# Patient Record
Sex: Female | Born: 1935 | Race: White | Hispanic: No | Smoking: Former smoker
Health system: Southern US, Community
[De-identification: ages and names within clinical notes are randomized; demographics above are authoritative.]

## PROBLEM LIST (undated history)

## (undated) DIAGNOSIS — K219 Gastro-esophageal reflux disease without esophagitis: Secondary | ICD-10-CM

## (undated) DIAGNOSIS — I10 Essential (primary) hypertension: Secondary | ICD-10-CM

## (undated) DIAGNOSIS — M810 Age-related osteoporosis without current pathological fracture: Secondary | ICD-10-CM

## (undated) DIAGNOSIS — I4891 Unspecified atrial fibrillation: Secondary | ICD-10-CM

## (undated) DIAGNOSIS — M1990 Unspecified osteoarthritis, unspecified site: Secondary | ICD-10-CM

## (undated) DIAGNOSIS — N3281 Overactive bladder: Secondary | ICD-10-CM

## (undated) DIAGNOSIS — E785 Hyperlipidemia, unspecified: Secondary | ICD-10-CM

## (undated) DIAGNOSIS — I639 Cerebral infarction, unspecified: Secondary | ICD-10-CM

## (undated) DIAGNOSIS — F329 Major depressive disorder, single episode, unspecified: Secondary | ICD-10-CM

## (undated) DIAGNOSIS — O009 Unspecified ectopic pregnancy without intrauterine pregnancy: Secondary | ICD-10-CM

## (undated) DIAGNOSIS — N183 Chronic kidney disease, stage 3 (moderate): Secondary | ICD-10-CM

## (undated) DIAGNOSIS — E669 Obesity, unspecified: Secondary | ICD-10-CM

## (undated) DIAGNOSIS — I872 Venous insufficiency (chronic) (peripheral): Secondary | ICD-10-CM

## (undated) DIAGNOSIS — C4491 Basal cell carcinoma of skin, unspecified: Secondary | ICD-10-CM

## (undated) DIAGNOSIS — J309 Allergic rhinitis, unspecified: Secondary | ICD-10-CM

## (undated) HISTORY — PX: NASAL MASS EXCISION: SHX2067

## (undated) HISTORY — DX: Basal cell carcinoma of skin, unspecified: C44.91

## (undated) HISTORY — DX: Unspecified ectopic pregnancy without intrauterine pregnancy: O00.90

## (undated) HISTORY — DX: Obesity, unspecified: E66.9

## (undated) HISTORY — PX: ABDOMINAL HYSTERECTOMY: SHX81

## (undated) HISTORY — PX: APPENDECTOMY: SHX54

## (undated) HISTORY — DX: Overactive bladder: N32.81

## (undated) HISTORY — DX: Age-related osteoporosis without current pathological fracture: M81.0

## (undated) HISTORY — PX: LAPAROSCOPIC SALPINGOOPHERECTOMY: SUR795

## (undated) HISTORY — PX: BREAST REDUCTION SURGERY: SHX8

## (undated) HISTORY — DX: Unspecified atrial fibrillation: I48.91

## (undated) HISTORY — DX: Unspecified osteoarthritis, unspecified site: M19.90

## (undated) HISTORY — PX: NASAL RECONSTRUCTION: SHX2069

## (undated) HISTORY — DX: Chronic kidney disease, stage 3 (moderate): N18.3

## (undated) HISTORY — DX: Allergic rhinitis, unspecified: J30.9

## (undated) HISTORY — DX: Major depressive disorder, single episode, unspecified: F32.9

## (undated) HISTORY — DX: Venous insufficiency (chronic) (peripheral): I87.2

---

## 2006-05-14 ENCOUNTER — Ambulatory Visit: Payer: Self-pay | Admitting: Family Medicine

## 2006-05-26 ENCOUNTER — Encounter: Payer: Self-pay | Admitting: Cardiology

## 2006-05-26 ENCOUNTER — Ambulatory Visit: Payer: Self-pay

## 2006-06-04 ENCOUNTER — Ambulatory Visit: Payer: Self-pay | Admitting: Family Medicine

## 2006-06-04 ENCOUNTER — Encounter: Admission: RE | Admit: 2006-06-04 | Discharge: 2006-06-04 | Payer: Self-pay | Admitting: Family Medicine

## 2006-06-16 ENCOUNTER — Ambulatory Visit: Payer: Self-pay | Admitting: Cardiovascular Disease

## 2006-06-24 ENCOUNTER — Ambulatory Visit: Payer: Self-pay

## 2006-07-17 ENCOUNTER — Ambulatory Visit: Payer: Self-pay | Admitting: Cardiovascular Disease

## 2006-08-05 ENCOUNTER — Ambulatory Visit: Payer: Self-pay | Admitting: Family Medicine

## 2007-01-12 ENCOUNTER — Ambulatory Visit: Payer: Self-pay | Admitting: Family Medicine

## 2007-04-14 ENCOUNTER — Ambulatory Visit: Payer: Self-pay | Admitting: Family Medicine

## 2007-11-04 ENCOUNTER — Encounter: Admission: RE | Admit: 2007-11-04 | Discharge: 2007-11-04 | Payer: Self-pay | Admitting: Family Medicine

## 2007-11-04 ENCOUNTER — Ambulatory Visit: Payer: Self-pay | Admitting: Family Medicine

## 2008-03-11 ENCOUNTER — Encounter: Admission: RE | Admit: 2008-03-11 | Discharge: 2008-03-11 | Payer: Self-pay | Admitting: Orthopaedic Surgery

## 2008-04-04 ENCOUNTER — Encounter: Admission: RE | Admit: 2008-04-04 | Discharge: 2008-04-04 | Payer: Self-pay | Admitting: Orthopaedic Surgery

## 2008-05-10 ENCOUNTER — Encounter: Admission: RE | Admit: 2008-05-10 | Discharge: 2008-05-10 | Payer: Self-pay | Admitting: Orthopaedic Surgery

## 2008-05-23 ENCOUNTER — Encounter: Admission: RE | Admit: 2008-05-23 | Discharge: 2008-05-23 | Payer: Self-pay | Admitting: Orthopaedic Surgery

## 2008-05-30 ENCOUNTER — Ambulatory Visit: Payer: Self-pay | Admitting: Family Medicine

## 2008-07-18 ENCOUNTER — Ambulatory Visit: Payer: Self-pay | Admitting: Cardiovascular Disease

## 2008-07-26 ENCOUNTER — Encounter: Admission: RE | Admit: 2008-07-26 | Discharge: 2008-10-12 | Payer: Self-pay | Admitting: Orthopaedic Surgery

## 2008-12-05 ENCOUNTER — Ambulatory Visit: Payer: Self-pay | Admitting: Family Medicine

## 2008-12-05 ENCOUNTER — Encounter: Admission: RE | Admit: 2008-12-05 | Discharge: 2008-12-05 | Payer: Self-pay | Admitting: Family Medicine

## 2009-02-27 ENCOUNTER — Encounter: Admission: RE | Admit: 2009-02-27 | Discharge: 2009-02-27 | Payer: Self-pay | Admitting: Family Medicine

## 2009-02-27 ENCOUNTER — Ambulatory Visit: Payer: Self-pay | Admitting: Family Medicine

## 2009-03-17 ENCOUNTER — Ambulatory Visit: Payer: Self-pay | Admitting: Family Medicine

## 2009-10-13 ENCOUNTER — Ambulatory Visit: Payer: Self-pay | Admitting: Family Medicine

## 2010-06-21 ENCOUNTER — Encounter: Admission: RE | Admit: 2010-06-21 | Discharge: 2010-06-21 | Payer: Self-pay | Admitting: Family Medicine

## 2011-03-12 NOTE — Assessment & Plan Note (Signed)
[facility]                            CARDIOLOGY OFFICE NOTE   NAME:FRIEDMANYulanda, Resendez                     MRN:          [id number]  DATE:07/18/2008                            DOB:          1928/02/24    REASON FOR VISIT:  Chest pain.   HISTORY OF PRESENT ILLNESS:  Charlene Wade is a delightful 77 year old  woman who was evaluated approximately 2 years ago for exertional  dyspnea.  At that time, she underwent a Myoview stress scan which showed  no ischemia.  Her LV was hyperdynamic and there was normal perfusion.  She underwent a 2-D echocardiogram that confirmed a hyperdynamic left  ventricle with normal systolic function.  She presents because of a new  episode of chest discomfort.  She was shopping at the grocery store  approximately 1 month ago and she developed a brief episode of chest  pain.  She has a difficult time describing the pain, but it lasted less  than 30 seconds.  There was associated dyspnea, lightheadedness and  diaphoresis.  She felt tired after the episode and went home and rested.  When she woke up, she felt back to normal.  This all occurred on a day  when she had not eaten breakfast and the episode occurred in the early  afternoon.  She has had no further symptoms.  She specifically denies  any recurrent chest pain or dyspnea.  She has no other complaints at  this time.  She is quite sedentary and her activity is limited by back  pain.   MEDICATIONS:  1. Coenzyme Q10 120 mg daily.  2. Green Tea 500 mg daily.  3. Fish oil 1 g daily.  4. TriCor 145 mg daily.  5. Lexapro 20 mg daily.  6. Allegra 180 mg daily.  7. Actonel 35 mg monthly.  8. Mobic 7.5 mg 2 daily.  9. Metformin 500 mg daily.  10.Norvasc 10 mg daily.  11.Toprol-XL 50 mg daily.  12.Diovan HCT 160/12.5 mg daily.  13.Furosemide 40 mg daily.  14.Pravachol 80 mg at bedtime.  15.Aspirin 81 mg daily.  16.B12 with folate daily.  17.Centrum Silver daily.  18.Glucosamine.  19.Chondroitin 2-4 daily.  20.Calcium plus D daily.  21.Prilosec 20 mg daily.   PHYSICAL EXAMINATION:  GENERAL:  The patient is alert and oriented.  She  is in no acute distress.  VITAL SIGNS:  Weight 180 pounds, blood pressure 139/65, heart rate 59,  respiratory rate 16.  HEENT:  Normal.  NECK:  Normal carotid upstrokes.  No bruits.  JVP normal.  LUNGS:  Clear bilaterally.  HEART:  Regular rate and rhythm.  No murmurs or gallops.  ABDOMEN:  Soft, nontender, obese, no organomegaly.  EXTREMITIES:  No clubbing, cyanosis or edema.  Peripheral pulses intact  and equal.   EKG shows normal sinus rhythm and is within normal limits.   ASSESSMENT:  1. Isolated episode of chest pain and shortness of breath.  With a      brief symptoms and only a single episode, I think, we can observe      for now.  I  am reassured by several things.      a.     Her EKG is normal.      b.     She has had no further episodes since this one event.      c.     She had a normal echocardiogram and perfusion scan as part       of her previous evaluation.  If she has recurrent episodes, I have       asked her to contact us.  We may need to do further testing at       that point.  2. Hypertension, blood pressure under good control on her current      regimen.  3. Dyslipidemia.  The patient is under the care of Dr. Redmond School.   I would be happy to see Ms. Manson back on an as-needed basis, if she  develops any other cardiac problems.     Juanda Bond. Burt Knack, MD  Electronically Signed    MDC/MedQ  DD: 07/18/2008  DT: 07/19/2008  Job #: [id number]   cc:   Jill Alexanders, M.D.

## 2011-03-15 NOTE — Assessment & Plan Note (Signed)
[facility]                              CARDIOLOGY OFFICE NOTE   NAME:FRIEDMANKeajah, Miesner                     MRN:          [id number]  DATE:06/16/2006                            DOB:          [birth year]    PATIENT IDENTIFICATION:  Mrs. Samaha is a very pleasant 78 year old woman  who is widowed and lives alone here in [place].   CHIEF COMPLAINT:  Shortness of breath and edema.   HISTORY OF PRESENT ILLNESS:  Mrs. Kuczek is a very pleasant 77 year old  woman who presents today with progressive dyspnea on exertion, as well as,  leg swelling.  She describes chronic dyspnea with climbing stairs, has  noticed some increase in her symptoms over the past several months.  She  denies orthopnea, PND, palpitations or chest discomfort.  She denies cough  or other respiratory complaints.  She rarely exercises and complains of  osteoarthritis, and so has been unable to perform any vigorous exercise.  Her second complaint is that of leg and feet swelling.  This has also been a  chronic problem, but much worse this summer which she attributes to the hot  weather.  Her feet are increasingly swollen throughout the day, but her  symptoms are minimal when she wakes up in the morning.  She has no other  complaints at this time.   PAST MEDICAL HISTORY:  1. Hypertension on multiple medications.  2. Diabetes, borderline.  3. Dyslipidemia.  4. Depression.  5. Seasonal allergies.  6. Gastroesophageal reflux disease.  7. Chronic back pain secondary to degenerative joint disease.  8. Osteoarthritis.   FAMILY HISTORY:  The patient's mother died at age 82 of heart disease.  Father died at age 28 of heart disease.  She has one sister who is healthy  and a younger sister who has leukemia.  She has a brother who is also alive  and well.   SOCIAL HISTORY:  The patient lives alone here in [place].  She recently  moved here.  She smoked cigarettes but quit 25 years  ago.  She drinks  alcohol very rarely.  She does not drink caffeinated beverages.  She is  retired.   REVIEW OF SYSTEMS:  Pertinent positive include dyspnea and edema as  described in the HPI.  She has rheumatic fever as a young child.  Fatigue is  also a positive as is seasonal allergies.  All other systems were reviewed  and are negative except as described as above.   MEDICATIONS:  1. Mobic 7.5 mg daily.  2. Metformin 500 mg daily, recently discontinued secondary to intolerance.  3. Actonel 35 mg weekly.  4. Norvasc 10 mg daily.  5. Toprol XL 50 mg daily.  6. Diovan HCTZ 160/12.5 mg daily.  7. Lasix 40 mg daily.  8. Pravachol 80 mg daily.  9. Aspirin 81 mg daily.  10.Celexa 20 mg one half daily.  11.Zyrtec 10 mg daily.  12.B12 and folate daily.  13.Centrum Silver daily.  14.Glucosamine chondroitin daily.  15.Calcium with vitamin D q.i.d.  16.Coenzyme Q10 daily.  17.Prilosec 20 mg daily.  18.Chromium 200  mg daily.  19.P.r.n. medications include Ultracet.   PHYSICAL EXAMINATION:  GENERAL APPEARANCE:  The patient is alert and  oriented.  She is in no acute distress.  VITAL SIGNS:  Weight 172 pounds, blood pressure 124/58 in the left arm,  128/60 in the right arm, heart rate 72, respirations 12.  HEENT:  Sclerae anicteric, conjunctivae pink.  ENT:  Oropharynx moist.  No  oral lesions.  No cervical lymphadenopathy.  LUNGS:  Clear to auscultation bilaterally.  CARDIOVASCULAR:  Heart exam shows normal apex.  Heart is regular rate and  rhythm.  There are no murmurs or gallops.  Carotid upstrokes are normal  without bruits.  Jugular venous distention is normal.  ABDOMEN:  Soft, nontender.  No hepatosplenomegaly.  No abdominal bruits.  No  abdominal aorta enlargement.  EXTREMITIES:  No clubbing or cyanosis present.  There is 1+ pretibial and  pedal edema bilaterally.  Peripheral pulses are 2+ and equal throughout.  SKIN:  Warm and dry without rash.  NEUROLOGICAL:  Grossly intact  with equal motor strength bilaterally.   STUDIES:  EKG demonstrates normal sinus rhythm with left atrial enlargement  pattern.  There are no LVH criteria met.   Echocardiogram performed May 26, 2006, demonstrated normal left ventricular  systolic function with no regional wall motion abnormalities.  The LV  function was noted to be hyperdynamic.  Left atrium was mild to moderately  dilated.  The left ventricular size was normal.   LABORATORY DATA:  BNP is mildly elevated at 155.  Lipid panel shows  triglycerides of 202, cholesterol 197, LDL 88, HDL 69.  Hemoglobin A1C was  6.0.   CHEST X-RAY:  Heart size at the upper limits of normal to mildly enlarged.  Remainder of the chest x-ray was normal.   ASSESSMENT/PLAN:  Ly Slayden is a 77 year old woman with chronic  exertional dyspnea and progressive pedal edema.   From a Cardiovascular standpoint, potential etiologies could be diastolic  heart failure versus ischemic heart disease with exertional dyspnea being an  anginal equivalent.  Regarding her echocardiogram, other than left atrial  enlargement, there were no obvious signs noted of diastolic dysfunction.  Even if she has this problem, her blood pressure is very well controlled at  this point, and she is on an excellent antihypertensive regimen with  Norvasc, Toprol, Diovan HCTZ, furosemide, all in combination.  Would not  make any changes to her current antihypertensive regimen.   Regarding her exertional dyspnea, I have recommended that she undergo  myocardial perfusion scan to rule out silent ischemia or dyspnea acting as  an anginal equivalent.  I will follow up with her after the results of this  study are complete.  She will require pharmacologic study because of her  significant osteoarthritis and inability to exercise.   I suspect her lower extremity edema is most likely a result of chronic venous insufficiency.  Recommended continuation of leg elevation, and she   could consider support stockings as well.                                 Blane Ohara, MD    MDC/MedQ  DD:  06/16/2006  DT:  06/17/2006  Job #:  [id number]   cc:   Jill Alexanders, MD

## 2011-03-15 NOTE — Letter (Signed)
July 17, 2006     Jill Alexanders, MD  [place], [place]  [id number]   RE:  [name], [name]  MRN:  [id number]  /  DOB:  02-28-1928   Dear Dr. Redmond School:   It was my pleasure to see Charlene Wade back in clinic today at the [facility]  cardiology clinic. As you will recall, she is a very pleasant 77 year old  woman who was initially presented for evaluation on June 16, 2006 for  exertional dyspnea and leg swelling. She had no prior history of  cardiovascular problems with the exception of hypertension and borderline  diabetes. I recommended a stress test and a adenosine myocardial perfusion  scan was done because the patient is unable to exercise as she has problems  with osteoarthritis. The myocardial perfusion scan was normal with normal  perfusion as well as normal left ventricular contractility.   She has no new complaints today, at the time of her evaluation.   Her medicines are unchanged and consist of, Mobic 7.5 mg daily, Metformin  500 mg daily, Actonel 35 mg weekly, Norvasc 10 mg daily, Toprol XL 50 mg  daily, Diovan XC TZ 160/12.5 mg daily, furosemide 40 mg daily, Pravachol 80  mg at bedtime, aspirin 80 mg daily, Celexa 10 mg daily, Zyrtec 10 mg daily,  and multiple vitamins.   On exam she is alert and oriented and no acute distress. Vital signs: Blood  pressure is 119/61 and her heart rate is 69. Lungs: Clear. Cardiac: Regular  rate and rhythm without murmurs or gallops. Abdomen: Soft and non-tender.  Extremities: Pulses are two-plus and equal in the periphery. She has one-  plus pretibial edema.   Charlene Wade is a 77 year old woman with dyspnea. I think the most likely  etiology is deconditioning. She would clearly benefit from an exercise  program which I reviewed at length with her today. From a cardiac standpoint  she has had a normal stress nuclear study as well as a normal  echocardiogram. It is possible that she has a component of  diastolic  disfunction, but this was not seen on her echo and her blood pressure has  been under excellent control on both of her visits here so I do not suspect  that this is a major contributor.   Regarding her hypertension, she is on multiple medications and is very well  controlled. I would not recommend any changes to her medical regimen.   I will see her back on a PRN basis. If she has any cardiac problems in the  future, please feel free to contact me at any time. Thanks again for the  opportunity to see Charlene Wade in clinic.    Sincerely,      Juanda Bond. Burt Knack, MD   MDC/MedQ  DD:  07/17/2006  DT:  07/19/2006  Job #:  [id number]

## 2011-03-22 ENCOUNTER — Ambulatory Visit: Payer: Self-pay | Admitting: Physical Medicine and Rehabilitation

## 2011-04-24 ENCOUNTER — Encounter
Payer: Medicare Other | Attending: Physical Medicine and Rehabilitation | Admitting: Physical Medicine and Rehabilitation

## 2011-04-24 DIAGNOSIS — M545 Low back pain, unspecified: Secondary | ICD-10-CM

## 2011-04-24 DIAGNOSIS — M47817 Spondylosis without myelopathy or radiculopathy, lumbosacral region: Secondary | ICD-10-CM

## 2011-04-24 DIAGNOSIS — M48061 Spinal stenosis, lumbar region without neurogenic claudication: Secondary | ICD-10-CM

## 2011-04-24 DIAGNOSIS — M7660 Achilles tendinitis, unspecified leg: Secondary | ICD-10-CM

## 2011-04-24 DIAGNOSIS — M79609 Pain in unspecified limb: Secondary | ICD-10-CM | POA: Insufficient documentation

## 2011-04-24 DIAGNOSIS — I739 Peripheral vascular disease, unspecified: Secondary | ICD-10-CM | POA: Insufficient documentation

## 2011-04-24 DIAGNOSIS — R269 Unspecified abnormalities of gait and mobility: Secondary | ICD-10-CM | POA: Insufficient documentation

## 2011-04-24 DIAGNOSIS — M1710 Unilateral primary osteoarthritis, unspecified knee: Secondary | ICD-10-CM | POA: Insufficient documentation

## 2011-04-24 DIAGNOSIS — M129 Arthropathy, unspecified: Secondary | ICD-10-CM | POA: Insufficient documentation

## 2011-04-25 NOTE — Progress Notes (Signed)
[name] is a very pleasant 77 year old woman, who is widowed and lives independently in a town house.  She was referred by Dr. Sherrlyn Wade, but is currently being managed by Charlene Wade.  [name] presents with a 20-year history of low back pain.  She did have a laminectomy back in 1990.  Over the last couple of years, she has had some increasing low back pain as well as lower extremity pain with ambulation.  Her legs get heavy when she walks for more than 2-3 minutes.  When she sits down, however, they get better for her.  She describes the pain as burning, tingling, and aching.  Her low back also bothers her quite a bit when she has been up for a while.  Her average pain varies depending on activity.  While she is sitting here in clinic, it is at 2 on a scale of 10, but it can go up to 6-9 depending if she has been up walking.  Her sleep is fair.  She reports some relief with tramadol.  She uses tramadol typically once a day.  She is allowed to take it twice a day, but typically she only takes it in the morning and she uses some acetaminophen in the afternoon.  Over the years, she has trialed various treatment options for her back pain including physical therapy, water aerobics, chiropractor, TENS unit.  She has also undergone three epidural steroid injections back in 2009. She does not really think they helped her that much.  She had been contemplating a surgical option through Dr. Patrice Wade, who at that time has done some imaging studies of her low back.  She tells me she was set up for fusion, but then tells me she "chickened out."  She then did not return back to see him after that.  FUNCTIONAL STATUS:  She walks without assistance, but does have a walker, but typically does not use it.  She had some difficulty with stairs.  She does drive.  She lives independently, is independent with self-care.  She has somebody help her and clean her home and can do some light cooking  with respect to meals.  REVIEW OF SYSTEMS:  She does report some problems with bladder, has been going on for about 20 years.  She does use a pad.  She reports some weakness in the legs, tingling, trouble walking.  Admits to some depression but denies suicidal ideation.  She also reports some early morning sweats, shortness of breath, and some occasional wheezing.  PAST MEDICAL HISTORY:  Significant for osteoporosis, hypertension, osteoarthritis, diabetes, possibly diagnosed in 2006, hyperlipidemia, depression, and vitamin D deficiency.  PAST SURGICAL HISTORY:  In 1952, ectopic pregnancy.  In 1952, appendectomy.  In 1974, hysterectomy.  In 1991, prolapsed disk status post laminectomy. In 1993, posterior enterocele repair.  In 1997, chin lift.  In 2001, renal angiogram.  In 2002, basal cell cancer removed from nose.  SOCIAL HISTORY:  The patient is widowed for about a decade.  She lives independently.  Denies smoking.  She quit 20 years ago.  She rarely uses alcohol.  FAMILY HISTORY:  Positive for heart disease, high blood pressure.  She also has a son who has had systemic lupus erythematosus.  MEDICATIONS:  She brings in today include aspirin, pravastatin, omeprazole, furosemide, TriCor, metoprolol, Claritin, Micardis, tramadol, and bupropion.  PHYSICAL EXAMINATION:  VITAL SIGNS:  Blood pressure is 127/44, pulse 58, respirations 18, 97% saturated on room air. GENERAL:  She is a  well-developed, obese, elderly woman, who does not appear in any distress.  She is oriented x3.  Speech is clear.  Affect is bright.  She is alert, cooperative, and pleasant.  Follows commands without difficulty.  Answers my questions appropriately. CRANIAL NERVES:  Coordination are grossly intact.  Reflexes are 2+ at the biceps and brachioradialis bilaterally, 1+ at the triceps bilaterally.  She has diminished reflexes at the patellar and Achilles tendon.  She has no abnormal tone, clonus, or tremors.   She has a negative Hoffmann's sign.  Her motor strength is quite good in both upper and lower extremities without obvious focal deficit.  She is able to transition from sitting to standing.  Her gait is relatively stable.  She has some difficulty with tandem gait, but is able to perform Romberg test adequately.  She has some mild limitations in cervical range of motion, but she has full shoulder range of motion.  She has very good lumbar flexion, some limitations and extension are noted.  Her back is not bothering her today during flexion and extension maneuvers.  She has some tenderness to palpation in the lower lumbar segments.  She has some tenderness over the Achilles tendon on the right as well with palpation.  Sensation is intact to light touch, pinprick, as well as vibratory sensation in upper as well as lower extremities.  She has some medial joint line tenderness in both knees without obvious effusion.  She does have some excess tissue at both knees and effusion, it is difficult to assess.  Review of some previous image studies from April 04, 2008, it is noted that there was an enhancing 12-mm lesion at the L1 vertebral body suggested followup was made at that time.  She also has known degenerative changes, anterolisthesis of L4 and L5, moderate left lateral stenosis, severe left foraminal narrowing, advanced facet arthropathy.  She has severe central and left greater than right foraminal narrowing at L5-S1, advanced left greater than right facet hypertrophy, left greater than right lateral recess and foraminal narrowing at L2-3 and L3-4.  IMPRESSION: 1. Multilevel lumbar spondylosis/facet arthropathy/stenosis. 2. Lower extremity claudication secondary to above. 3. Knee osteoarthritis. 4. Right heel pain consistent with mild Achilles tendonitis.  PLAN: 1. Discussion with her primary care giver, currently Charlene Wade, phone     call to [phone], speaking with primary care  suggests repeat     MRI with contrast.  She indicates she will arrange this. 2. Regarding her gait instability, I have recommended she follow up     with physical therapy for balance exercises and lower extremity     strengthening and working on lower extremity endurance.  Regarding     pain management, I have suggested various treatment options     including continuing current regime, considering use of gabapentin,     considering epidural steroid injection.  She is currently not     interested in surgery at this time.  She would like to trial     gabapentin.  I reviewed the risks and benefits of this medication     with her.  We will start her to very low dose 100 mg, titrating up     from 1 to 3 a day over the next couple of weeks.  We also discussed     medial branch blocks as well, however, we will hold off on any     injections until imaging studies are complete.  I will see her back  in the next couple of weeks, 3-4 weeks, to review the scan as well     as get her into some physical therapy.     I have discussed the treatment options and her diagnosis with her     sister as well, Charlene Wade, per the patient's request.  I have     answered all their questions.  They are in agreement with the     current plan.     Franchot Gallo, M.D. Electronically Signed    DMK/MedQ D:  04/24/2011 13:30:21  T:  04/25/2011 02:54:37  Job #:  [id number]  cc:   Charlene Chimes, MD [address]

## 2011-05-09 ENCOUNTER — Other Ambulatory Visit: Payer: Self-pay | Admitting: Family Medicine

## 2011-05-09 DIAGNOSIS — M549 Dorsalgia, unspecified: Secondary | ICD-10-CM

## 2011-05-13 ENCOUNTER — Ambulatory Visit
Admission: RE | Admit: 2011-05-13 | Discharge: 2011-05-13 | Disposition: A | Discharge status: Home / Self Care | Payer: Medicare Other | Source: Ambulatory Visit | Attending: Family Medicine | Admitting: Family Medicine

## 2011-05-13 DIAGNOSIS — M549 Dorsalgia, unspecified: Secondary | ICD-10-CM

## 2011-05-13 MED ORDER — GADOBENATE DIMEGLUMINE 529 MG/ML IV SOLN: 8.0000 mL | Freq: Once | INTRAVENOUS | Status: AC | PRN

## 2011-05-27 ENCOUNTER — Encounter
Payer: Medicare Other | Attending: Physical Medicine and Rehabilitation | Admitting: Physical Medicine and Rehabilitation

## 2011-05-27 DIAGNOSIS — M1710 Unilateral primary osteoarthritis, unspecified knee: Secondary | ICD-10-CM | POA: Insufficient documentation

## 2011-05-27 DIAGNOSIS — M545 Low back pain, unspecified: Secondary | ICD-10-CM

## 2011-05-27 DIAGNOSIS — R209 Unspecified disturbances of skin sensation: Secondary | ICD-10-CM | POA: Insufficient documentation

## 2011-05-27 DIAGNOSIS — R29898 Other symptoms and signs involving the musculoskeletal system: Secondary | ICD-10-CM | POA: Insufficient documentation

## 2011-05-27 DIAGNOSIS — M79609 Pain in unspecified limb: Secondary | ICD-10-CM

## 2011-05-27 DIAGNOSIS — G894 Chronic pain syndrome: Secondary | ICD-10-CM

## 2011-05-27 DIAGNOSIS — M48061 Spinal stenosis, lumbar region without neurogenic claudication: Secondary | ICD-10-CM | POA: Insufficient documentation

## 2011-05-27 DIAGNOSIS — M47817 Spondylosis without myelopathy or radiculopathy, lumbosacral region: Secondary | ICD-10-CM | POA: Insufficient documentation

## 2011-05-27 NOTE — Assessment & Plan Note (Signed)
[name] is a very pleasant 77 year old widowed woman, who lives independently, who is a patient of Charlene Wade.  She initially was referred by Dr. Sherrlyn Wade, but is now being managed by Charlene Wade.  [name] has a 20-year-history of low back pain dating back to laminectomy in 1990, and over the last several years, she has had increasing low back pain and lower extremity pain.  Her legs get heavy when she has been walking for more than 2 or 3 minutes.  I made a call over to her caregiver Charlene Wade of suggesting repeat MRI which she did order and was carried out on May 13, 2011.  The results of that are attached to the chart today.  Most significantly, she has severe spinal stenosis at L5-S1 with severe left lateral recess impingement by her hypertrophy of the left facet joint and ligamentum flavum.  Also, noted mild left lateral recess stenosis at L2-3 due to increased retrolisthesis and disk bulging and endplate spurring, moderate left foraminal stenosis, and mild spinal stenosis also at L4-5.  Results of the MRI reviewed with [name].  I asked her why she did not follow through with surgery per Dr. Patrice Wade when she saw him and he offered her surgery at that time.  She states that at the time she was scheduled for surgery, she had developed a cyst in her mouth and was getting that taking care of, so she did not follow through with Surgery.  Her average pain is about 6-8 on a scale of 10, predominately located in low back, radiating to both lower extremities when she is up walking. Her pain is not bad when she is sitting and basically improves when she is at rest.  She can walk without assistance, but does use a cane or walker.  She is able to climb stairs and drive.  She cannot walk much more than 2-5 minutes.  She is independent with self-care, continues to live independently.  Denies problems with bowel or bladder.  Admits to some depression.  Denies suicidal ideation.   She does admit to some numbness and weakness in the lower extremities.  No other changes in past medical, social, or family history since her last visit.  Medications prescribed through [facility] include gabapentin 100 mg three times a day.  PHYSICAL EXAMINATION:  VITAL SIGNS:  Blood pressure is 148/63, pulse 63, respirations 16, and 96% saturated on room air. GENERAL:  She is a mildly obese, elderly woman, who appears her stated age, and does not appear in any distress.  She is oriented x3.  Speech is clear.  Affect is bright.  She is alert, cooperative and pleasant. Follows commands without difficulty.  Answers my questions appropriately. NEUROLOGIC:  Cranial nerves to coordination are intact.  Reflexes are 2+ at the biceps bilaterally, 1+ at the brachioradialis bilaterally, diminished at the triceps tendon bilaterally, zero at the knees and ankles bilaterally.  No abnormal tone, clonus, or tremors are noted. Hoffman's sign negative.  Transitions from sitting to standing slowly, has minimal difficulty with tandem gait.  Romberg test is performed adequately.  Limitations are noted in cervical range of motion, relatively well- preserved shoulder motion, limitations in lumbar motion especially with extension.  Her mother strength is generally good in both upper and lower extremities with the exception of EHL is weak bilaterally.  Vibratory, position, sense are intact as well in both lower extremities.  IMPRESSION: 1. Multilevel lumbar spondylosis/facet arthropathy/severe stenosis at  L5-S1 per recent MRI, May 13, 2011. 2. Lower extremity claudication secondary to above. 3. Knee osteoarthritis.  PLAN:  I reviewed treatment options with respect to pain management. She has found the gabapentin might be somewhat helpful.  She is done well on 100 mg three times a day.  She has not had any side effects from it.  I will increase it to 2 tablets p.o. t.i.d. and I have  instructed her if she has any problems with it to back down to 1 tablet t.i.d again, if she has any difficulty with this increase in the dosage.  She is considering following back up with Dr. Patrice Wade again for a possible more definitive treatment of her leg pain.  She is currently comfortable with our plan.  I will see her back in 6 weeks.     Franchot Gallo, M.D. Electronically Signed    DMK/MedQ D:  05/27/2011 12:13:08  T:  05/27/2011 17:26:41  Job #:  [id number]  cc:   Charlene Wade primary caregiver

## 2011-06-04 ENCOUNTER — Other Ambulatory Visit: Payer: Self-pay | Admitting: Family Medicine

## 2011-06-04 DIAGNOSIS — Z1231 Encounter for screening mammogram for malignant neoplasm of breast: Secondary | ICD-10-CM

## 2011-07-08 ENCOUNTER — Encounter
Payer: Medicare Other | Attending: Physical Medicine and Rehabilitation | Admitting: Physical Medicine and Rehabilitation

## 2011-07-08 DIAGNOSIS — Z79899 Other long term (current) drug therapy: Secondary | ICD-10-CM | POA: Insufficient documentation

## 2011-07-08 DIAGNOSIS — M545 Low back pain, unspecified: Secondary | ICD-10-CM

## 2011-07-08 DIAGNOSIS — M79609 Pain in unspecified limb: Secondary | ICD-10-CM

## 2011-07-08 DIAGNOSIS — M129 Arthropathy, unspecified: Secondary | ICD-10-CM | POA: Insufficient documentation

## 2011-07-08 DIAGNOSIS — M48061 Spinal stenosis, lumbar region without neurogenic claudication: Secondary | ICD-10-CM | POA: Insufficient documentation

## 2011-07-08 DIAGNOSIS — M1710 Unilateral primary osteoarthritis, unspecified knee: Secondary | ICD-10-CM | POA: Insufficient documentation

## 2011-07-08 DIAGNOSIS — M47817 Spondylosis without myelopathy or radiculopathy, lumbosacral region: Secondary | ICD-10-CM | POA: Insufficient documentation

## 2011-07-08 DIAGNOSIS — G894 Chronic pain syndrome: Secondary | ICD-10-CM

## 2011-07-08 DIAGNOSIS — G8929 Other chronic pain: Secondary | ICD-10-CM | POA: Insufficient documentation

## 2011-07-08 NOTE — Assessment & Plan Note (Signed)
[name] is a very pleasant 77 year old woman who is followed here in our [facility] for chronic pain complaints related to her severe stenosis at L5-S1.  She has a leg pain, predominately when she is up walking.  She describes it as sharp and burning.  When she is sitting, she is essentially pain-free.  At the last visit, she was trialed on a little higher dose of gabapentin going from 100 mg 3 times a day to 100 mg 2 tablets 3 times a day, and she reports an overall reduction in her pain scores.  She had been running at about 6, 7, and 8 and currently, she is at a 4, 5, 6.  She sleeps well.  She does not have pain at night.  She reports pain with walking, bending, standing, improves with rest and medication, and she reports that she gets good relief with the current meds.  Medications [facility] through [facility] include gabapentin 100 mg 2 tablets p.o. t.i.d.  She also has a prescription for tramadol from her primary care doctor, but she uses it typically only once per day.  FUNCTIONAL STATUS:  She is walking 5-10 minutes at a time.  She can climb stairs.  She does drive.  She is currently living independently, independent with all self-care.  REVIEW OF SYSTEMS:  Negative for problems controlling bowel or bladder, admits to some numbness and weakness in the lower extremities.  Denies depression, anxiety, anxiety, or suicidal ideation.  Review of systems otherwise negative.  No changes in past medical, social, or family history.  PHYSICAL EXAMINATION:  Her blood pressure is 138/57, pulse 62, respirations 18, 94% saturated on room air.  She is a well-developed obese elderly female who does not appear in any distress.  She is oriented x3.  Speech is clear.  Affect is bright.  She is alert, cooperative, and pleasant.  Follows commands without difficulty, answers my questions appropriately.  Cranial nerves coordination are  intact. Reflexes are diminished in the lower extremities.  No abnormal tone, clonus, or tremors are noted.  Motor strength is good in both lower extremities, 5/5 at hip flexors, knee extensors, dorsiflexors, plantar flexors.  She transitions from sitting to standing slowly and carefully.  She ambulates independently in the room without an assistive device.  She has a little difficulty with tandem gait.  Romberg test is performed adequately.  She has rather well-preserved range of motion with respect to forward flexion.  She has limited lumbar extension.  No sensory deficits are noted in the lower extremities.  IMPRESSION: 1. Multilevel lumbar spondylosis/facet arthropathy/severe stenosis at     L5-S1 per MRI May 13, 2011. 2. Lower extremity claudication secondary to above. 3. Knee osteoarthritis.  PLAN:  I have reviewed treatment options with her again.  I have suggested physical therapy for lower extremity strengthening and balance.  She declines this.  She has seen a surgeon in the past and is not interested in surgical consult.  She is not interested in any invasive means including spinal injections to help manage her pain at this time.  She states that she will let me know if she gets worse.  She will be in touch with [place].  She finds the gabapentin taking 2 tablets 3 times a day is somewhat helpful.  She also uses tramadol 2 tablets typically once in the morning.  Occasionally, she takes another tablet in the afternoon, and she is remaining functional on these medications and  continues to live independently.  She is not complaining of any problems with oversedation or gait instability.  I have answered all her questions.  She is comfortable with this plan.  I have given her 3 refills on her gabapentin.  I will see her back in 4 months.     Franchot Gallo, M.D. Electronically Signed    DMK/MedQ D:  07/08/2011 13:07:43  T:  07/08/2011 23:02:29  Job #:  [id number]

## 2011-07-11 ENCOUNTER — Ambulatory Visit
Admission: RE | Admit: 2011-07-11 | Discharge: 2011-07-11 | Disposition: A | Discharge status: Home / Self Care | Payer: Medicare Other | Source: Ambulatory Visit | Attending: Family Medicine | Admitting: Family Medicine

## 2011-07-11 DIAGNOSIS — Z1231 Encounter for screening mammogram for malignant neoplasm of breast: Secondary | ICD-10-CM

## 2011-10-30 ENCOUNTER — Ambulatory Visit: Payer: Medicare Other | Admitting: Physical Medicine and Rehabilitation

## 2011-11-04 ENCOUNTER — Encounter
Payer: Medicare Other | Attending: Physical Medicine and Rehabilitation | Admitting: Physical Medicine and Rehabilitation

## 2011-11-04 DIAGNOSIS — M545 Low back pain, unspecified: Secondary | ICD-10-CM

## 2011-11-04 DIAGNOSIS — G8928 Other chronic postprocedural pain: Secondary | ICD-10-CM

## 2011-11-04 DIAGNOSIS — M79609 Pain in unspecified limb: Secondary | ICD-10-CM | POA: Insufficient documentation

## 2011-11-04 DIAGNOSIS — M1710 Unilateral primary osteoarthritis, unspecified knee: Secondary | ICD-10-CM | POA: Insufficient documentation

## 2011-11-04 DIAGNOSIS — M47817 Spondylosis without myelopathy or radiculopathy, lumbosacral region: Secondary | ICD-10-CM | POA: Insufficient documentation

## 2011-11-04 DIAGNOSIS — G8929 Other chronic pain: Secondary | ICD-10-CM | POA: Insufficient documentation

## 2011-11-04 DIAGNOSIS — I739 Peripheral vascular disease, unspecified: Secondary | ICD-10-CM | POA: Insufficient documentation

## 2011-11-04 DIAGNOSIS — M48061 Spinal stenosis, lumbar region without neurogenic claudication: Secondary | ICD-10-CM | POA: Insufficient documentation

## 2011-11-05 NOTE — Assessment & Plan Note (Signed)
[name] is a pleasant 78 year old woman who is followed here at our [facility] for chronic pain complaints related to her severe lumbar stenosis at L5-S1.  She has predominantly leg pain when she is up walking.  She describes it as sharp and burning.  When she is sitting, she is essentially pain free.  She has been placed on gabapentin initially 100 mg t.i.d., she did fairly well, her dose was increased slightly to 2 tablets of the 100-mg tablet 3 times a day.  She states that at the 200 mg 3 times a day, she had increased sleepiness and she has decreased her dose back down to 100 mg 3 times a day and feels she is doing well on this.  No changes in her functional status.  She can walk 5-10 minutes at a time.  She is able to climb stairs.  She does drive independent with self-care.  She lives independently.  REVIEW OF SYSTEMS:  Noncontributory.  Past medical, social, family history are unchanged from previous visit.  PHYSICAL EXAMINATION:  VITAL SIGNS:  Blood pressure is 160/58, pulse 67, respirations 18, 98% saturated on room air. GENERAL:  She is a mildly obese woman who does not appear in any distress.  She is oriented x3.  Speech is clear.  Affect is bright.  She is alert, cooperative, and pleasant.  Follows commands without difficulty.  Answers questions appropriately. NEUROLOGIC:  Cranial nerves, coordination are intact. MUSCULOSKELETAL:  Her reflexes are diminished in the lower extremities without abnormal tone, clonus, or tremors.  Motor strength is relatively good in both lower extremities.  5/5 at hip flexors, knee extensors, dorsiflexors, plantar flexors.  She does have some weakness in hip extensors as noted when she attempts to transition from sit to stand. She has some limitations in lumbar motion.  She has relatively stable gait.  IMPRESSION: 1. Multilevel lumbar spondylosis/facet arthropathy/severe stenosis at     L5-S1  per MRI on May 13, 2011. 2. Lower extremity claudication secondary to above 3. Knee osteoarthritis.  PLAN:  We discussed consideration of physical therapy, she is currently not interested.  She would like to continue using gabapentin 100 mg 3 times a day, #90 with 3 refills.  I will see her back in 3-4 months. She is comfortable with our treatment plan.     Franchot Gallo, M.D. Electronically Signed    DMK/MedQ D:  11/04/2011 13:27:41  T:  11/05/2011 06:08:17  Job #:  [id number]

## 2011-12-02 ENCOUNTER — Emergency Department (HOSPITAL_BASED_OUTPATIENT_CLINIC_OR_DEPARTMENT_OTHER)
Admission: EM | Admit: 2011-12-02 | Discharge: 2011-12-02 | Disposition: A | Discharge status: Home / Self Care | Payer: Medicare Other | Attending: Emergency Medicine | Admitting: Emergency Medicine

## 2011-12-02 ENCOUNTER — Emergency Department (INDEPENDENT_AMBULATORY_CARE_PROVIDER_SITE_OTHER): Payer: Medicare Other

## 2011-12-02 ENCOUNTER — Encounter (HOSPITAL_BASED_OUTPATIENT_CLINIC_OR_DEPARTMENT_OTHER): Payer: Self-pay

## 2011-12-02 DIAGNOSIS — S42293A Other displaced fracture of upper end of unspecified humerus, initial encounter for closed fracture: Secondary | ICD-10-CM

## 2011-12-02 DIAGNOSIS — W07XXXA Fall from chair, initial encounter: Secondary | ICD-10-CM | POA: Insufficient documentation

## 2011-12-02 DIAGNOSIS — S42213A Unspecified displaced fracture of surgical neck of unspecified humerus, initial encounter for closed fracture: Secondary | ICD-10-CM

## 2011-12-02 DIAGNOSIS — Y92009 Unspecified place in unspecified non-institutional (private) residence as the place of occurrence of the external cause: Secondary | ICD-10-CM | POA: Insufficient documentation

## 2011-12-02 DIAGNOSIS — W19XXXA Unspecified fall, initial encounter: Secondary | ICD-10-CM

## 2011-12-02 MED ORDER — HYDROCODONE-ACETAMINOPHEN 5-325 MG PO TABS: 1.0000 | ORAL_TABLET | ORAL | Status: AC | PRN

## 2011-12-02 MED ORDER — DOCUSATE SODIUM 100 MG PO CAPS: 100.0000 mg | ORAL_CAPSULE | Freq: Two times a day (BID) | ORAL | Status: AC

## 2011-12-02 MED ORDER — HYDROCODONE-ACETAMINOPHEN 5-325 MG PO TABS
1.0000 | ORAL_TABLET | Freq: Once | ORAL | Status: AC
  Administered 2011-12-02: 1 via ORAL
  Filled 2011-12-02: qty 1

## 2011-12-02 NOTE — ED Notes (Signed)
Pt fell from a standing position and now c/o right arm pain.

## 2011-12-02 NOTE — Discharge Instructions (Signed)
 Shoulder Fracture You have a fractured humerus (bone in the upper arm) at the shoulder just below the ball of the shoulder joint. Most of the time the bones of a broken shoulder are in an acceptable position. Usually the injury can be treated with a shoulder immobilizer or sling and swath bandage. These devices support the arm and prevent any shoulder movement. If the bones are not in a good position, then surgery is sometimes needed. Shoulder fractures usually cause swelling, pain, and discoloration around the upper arm initially. They heal in 8-12 weeks with proper treatment. Rest in bed or a reclining chair as long as your shoulder is very painful. Sitting up generally results in less pain at the fracture site. Do not remove your shoulder bandage until your caregiver approves. You may apply ice packs over the shoulder for 20-30 minutes every 2 hours for the next 2-3 days to reduce the pain and swelling. Use your pain medicine as prescribed.  SEEK IMMEDIATE MEDICAL CARE IF:  You develop severe shoulder pain unrelieved by rest and taking pain medicine.   You have pain, numbness, tingling, or weakness in the hand or wrist.   You develop shortness of breath, chest pain, severe weakness, or fainting.   You have severe pain with motion of the fingers or wrist.  MAKE SURE YOU:   Understand these instructions.   Will watch your condition.   Will get help right away if you are not doing well or get worse.  Document Released: 11/21/2004 Document Revised: 06/26/2011 Document Reviewed: 02/01/2009 [facility] Patient Information 2012 [place], [place].   Narcotic and benzodiazepine use may cause drowsiness, slowed breathing or dependence.  Please use with caution and do not drive, operate machinery or watch young children alone while taking them.  Taking combinations of these medications or drinking alcohol  will potentiate these effects.   Narcotics can cause constipation, so please be sure to use stools  softeners and plenty of water to prevent constipation while taking narcotic pain medication.

## 2011-12-02 NOTE — ED Notes (Signed)
Patient transported to X-ray 

## 2011-12-02 NOTE — ED Provider Notes (Signed)
History  This chart was scribed for Saddie Benders. Dorna Mai, MD by Jenne Campus. This patient was seen in room MH03/MH03 and the patient's care was started at 4:33PM.  CSN: [id number]  Arrival date & time 12/02/11  1623   First MD Initiated Contact with Patient 12/02/11 1633      Chief Complaint  Patient presents with  . Arm Pain    The history is provided by the patient. No language interpreter was used.    Charlene Wade is a 78 y.o. female who presents to the Emergency Department complaining of a fall that occurred PTA. Pt states that she was reading in her recliner at home when she attempted to get up unaware that her legs had fallen asleep. She states that she tried to brace her fall with her right arm as she landed on the carpeted floor. She reports feeling left ankle pain and right arm pain in the shoulder and upper arm area described as a throbbing sensation that is worse with movement and better with rest. She denies taking any medications PTA to improve symptoms. She denies neck pain and LOC as associated symptoms. She has no h/o chronic medical conditions. She is a former smoker and denies alcohol use.  Pt is left-handed.  Pt sees P.A. Scherrie Bateman at [facility].  Past Medical History  Diagnosis Date  . Ectopic pregnancy     Past Surgical History  Procedure Date  . Abdominal hysterectomy   . Breast reduction surgery   . Laparoscopic salpingoopherectomy   . Appendectomy     No family history on file.  History  Substance Use Topics  . Smoking status: Former Research scientist (life sciences)  . Smokeless tobacco: Never Used  . Alcohol Use: No     Review of Systems  A complete 10 system review of systems was obtained and is otherwise negative except as noted in the HPI and PMH.   Allergies  Ace inhibitors; Penicillins; and Sulfa antibiotics  Home Medications   Current Outpatient Rx  Name Route Sig Dispense Refill  . ASPIRIN 81 MG PO TABS Oral Take 160 mg by mouth daily.    Marland Kitchen  GABAPENTIN 100 MG PO CAPS Oral Take by mouth.    . TRAMADOL HCL 50 MG PO TABS Oral Take 50 mg by mouth every 6 (six) hours as needed.      Triage Vitals: BP 152/50  Pulse 64  Temp(Src) 98.7 F (37.1 C) (Oral)  Resp 18  SpO2 100%  Physical Exam  Nursing note and vitals reviewed. Constitutional: She is oriented to person, place, and time. She appears well-developed and well-nourished.  HENT:  Head: Normocephalic and atraumatic.  Eyes: Conjunctivae and EOM are normal.  Neck: Normal range of motion. Neck supple.  Cardiovascular: Normal rate and regular rhythm.        Good DPs  Pulmonary/Chest: Effort normal and breath sounds normal. No respiratory distress.  Abdominal: Soft. Bowel sounds are normal. There is no tenderness.  Musculoskeletal:       No C, L or T spine tenderness, no right clavicle tenderness, no deformities noted, pain with palpation and movement of right shoulder  Neurological: She is alert and oriented to person, place, and time. No cranial nerve deficit.  Skin: Skin is warm and dry.  Psychiatric: She has a normal mood and affect. Her behavior is normal.    ED Course  Procedures (including critical care time)  DIAGNOSTIC STUDIES: Oxygen Saturation is 100% on room air, normal by my interpretation.  COORDINATION OF CARE: 4:41PM-Pt examined and pt's medical, surgical and family history were reviewed. Discussed Vicodin and right shoulder x-ray with pt and pt agreed to plan.    Labs Reviewed - No data to display  Dg Shoulder Right  12/02/2011  *RADIOLOGY REPORT*  Clinical Data: Pain post fall.  RIGHT SHOULDER - 2+ VIEW  Comparison: None.  Findings: There is a transverse fracture of the surgical neck of the humerus and a comminuted intrarticular fracture of the humeral head, major subchondral fracture fragments displaced at least    8 mm.  There is mild angulation of the surgical neck fracture without significant distraction.  There is no dislocation.  No other acute  bony abnormality.  Degenerative changes noted in the visualized lower cervical spine.  IMPRESSION:  1.  Minimally comminuted fracture of the humeral head and surgical neck as above.  Original Report Authenticated By: Trecia Rogers, M.D.   I reviewed the above film myself.    1. Humeral head fracture       MDM   Patient reports falling after her legs had fallen asleep while she had been sitting in a chair for a prolonged period of time. She did not have a syncopal event. She did not have any chest pain or any anginal equivalent. There are no focal deficits on my exam. Patient has discomfort in her right shoulder region with movement of her right upper extremity. Plain films which I reviewed myself shows a comminuted fracture of her proximal humerus. Plan is to place her in a shoulder immobilizer and she'll be referred to an orthopedist for followup.     I personally performed the services described in this documentation, which was scribed in my presence. The recorded information has been reviewed and considered.  6:27 PM Patient had some improvement in her pain. Patient is placed in a sling. Patient prefers to see Dr. Lorin Mercy which her friends have told her he suggested orthopedist. If patient is able to stand and ambulate, will discharge her to home.  Saddie Benders. Courney Garrod, MD 12/02/11 1827

## 2012-02-12 ENCOUNTER — Ambulatory Visit: Payer: Medicare Other | Attending: Orthopedic Surgery | Admitting: Physical Therapy

## 2012-02-12 DIAGNOSIS — IMO0001 Reserved for inherently not codable concepts without codable children: Secondary | ICD-10-CM | POA: Insufficient documentation

## 2012-02-12 DIAGNOSIS — M25619 Stiffness of unspecified shoulder, not elsewhere classified: Secondary | ICD-10-CM | POA: Insufficient documentation

## 2012-02-12 DIAGNOSIS — M25519 Pain in unspecified shoulder: Secondary | ICD-10-CM | POA: Insufficient documentation

## 2012-02-17 ENCOUNTER — Ambulatory Visit: Payer: Medicare Other | Admitting: Physical Therapy

## 2012-02-19 ENCOUNTER — Ambulatory Visit: Payer: Medicare Other | Admitting: Physical Therapy

## 2012-02-24 ENCOUNTER — Ambulatory Visit: Payer: Medicare Other | Admitting: Physical Therapy

## 2012-02-26 ENCOUNTER — Ambulatory Visit: Payer: Medicare Other | Attending: Orthopedic Surgery | Admitting: Physical Therapy

## 2012-02-26 DIAGNOSIS — M25519 Pain in unspecified shoulder: Secondary | ICD-10-CM | POA: Insufficient documentation

## 2012-02-26 DIAGNOSIS — IMO0001 Reserved for inherently not codable concepts without codable children: Secondary | ICD-10-CM | POA: Insufficient documentation

## 2012-02-26 DIAGNOSIS — M25619 Stiffness of unspecified shoulder, not elsewhere classified: Secondary | ICD-10-CM | POA: Insufficient documentation

## 2012-03-02 ENCOUNTER — Encounter: Payer: Medicare Other | Admitting: Physical Medicine and Rehabilitation

## 2012-03-02 ENCOUNTER — Ambulatory Visit: Payer: Medicare Other | Admitting: Physical Therapy

## 2012-03-03 ENCOUNTER — Other Ambulatory Visit: Payer: Self-pay | Admitting: *Deleted

## 2012-03-03 MED ORDER — GABAPENTIN 100 MG PO CAPS: 100.0000 mg | ORAL_CAPSULE | Freq: Three times a day (TID) | ORAL | Status: DC

## 2012-03-04 ENCOUNTER — Ambulatory Visit: Payer: Medicare Other | Admitting: Physical Therapy

## 2012-03-09 ENCOUNTER — Ambulatory Visit: Payer: Medicare Other | Admitting: Physical Therapy

## 2012-03-11 ENCOUNTER — Ambulatory Visit: Payer: Medicare Other | Admitting: Physical Therapy

## 2012-03-16 ENCOUNTER — Ambulatory Visit: Payer: Medicare Other | Admitting: Physical Therapy

## 2012-03-18 ENCOUNTER — Ambulatory Visit: Payer: Medicare Other | Admitting: Physical Therapy

## 2012-03-24 ENCOUNTER — Ambulatory Visit: Payer: Medicare Other | Admitting: Physical Therapy

## 2012-03-25 ENCOUNTER — Ambulatory Visit: Payer: Medicare Other | Admitting: Physical Therapy

## 2012-03-26 ENCOUNTER — Ambulatory Visit: Payer: Medicare Other | Admitting: Physical Therapy

## 2012-03-30 ENCOUNTER — Ambulatory Visit: Payer: Medicare Other | Attending: Orthopedic Surgery | Admitting: Physical Therapy

## 2012-03-30 ENCOUNTER — Other Ambulatory Visit: Payer: Self-pay | Admitting: *Deleted

## 2012-03-30 DIAGNOSIS — M25619 Stiffness of unspecified shoulder, not elsewhere classified: Secondary | ICD-10-CM | POA: Insufficient documentation

## 2012-03-30 DIAGNOSIS — M25519 Pain in unspecified shoulder: Secondary | ICD-10-CM | POA: Insufficient documentation

## 2012-03-30 DIAGNOSIS — IMO0001 Reserved for inherently not codable concepts without codable children: Secondary | ICD-10-CM | POA: Insufficient documentation

## 2012-03-30 MED ORDER — GABAPENTIN 100 MG PO CAPS: 100.0000 mg | ORAL_CAPSULE | Freq: Three times a day (TID) | ORAL | Status: DC

## 2012-04-02 ENCOUNTER — Ambulatory Visit: Payer: Medicare Other | Admitting: Physical Therapy

## 2012-04-13 ENCOUNTER — Ambulatory Visit: Payer: Medicare Other | Admitting: Physical Therapy

## 2012-04-15 ENCOUNTER — Ambulatory Visit: Payer: Medicare Other | Admitting: Rehabilitation

## 2012-04-20 ENCOUNTER — Ambulatory Visit: Payer: Medicare Other | Admitting: Physical Therapy

## 2012-04-22 ENCOUNTER — Ambulatory Visit: Payer: Medicare Other | Admitting: Physical Therapy

## 2012-04-27 ENCOUNTER — Ambulatory Visit: Payer: Medicare Other | Attending: Orthopedic Surgery | Admitting: Physical Therapy

## 2012-04-27 DIAGNOSIS — M25519 Pain in unspecified shoulder: Secondary | ICD-10-CM | POA: Insufficient documentation

## 2012-04-27 DIAGNOSIS — M25619 Stiffness of unspecified shoulder, not elsewhere classified: Secondary | ICD-10-CM | POA: Insufficient documentation

## 2012-04-27 DIAGNOSIS — IMO0001 Reserved for inherently not codable concepts without codable children: Secondary | ICD-10-CM | POA: Insufficient documentation

## 2012-04-29 ENCOUNTER — Ambulatory Visit: Payer: Medicare Other | Admitting: Rehabilitation

## 2012-05-04 ENCOUNTER — Ambulatory Visit: Payer: Medicare Other | Admitting: Physical Therapy

## 2012-05-06 ENCOUNTER — Ambulatory Visit: Payer: Medicare Other | Admitting: Physical Therapy

## 2012-05-11 ENCOUNTER — Ambulatory Visit: Payer: Medicare Other | Admitting: Physical Therapy

## 2012-05-13 ENCOUNTER — Ambulatory Visit: Payer: Medicare Other | Admitting: Physical Therapy

## 2012-05-18 ENCOUNTER — Ambulatory Visit: Payer: Medicare Other | Admitting: Physical Therapy

## 2012-05-20 ENCOUNTER — Ambulatory Visit: Payer: Medicare Other | Admitting: Physical Therapy

## 2012-05-25 ENCOUNTER — Ambulatory Visit: Payer: Medicare Other | Admitting: Rehabilitation

## 2012-05-27 ENCOUNTER — Ambulatory Visit: Payer: Medicare Other | Admitting: Physical Therapy

## 2012-05-28 ENCOUNTER — Other Ambulatory Visit: Payer: Self-pay | Admitting: *Deleted

## 2012-05-28 MED ORDER — GABAPENTIN 100 MG PO CAPS: 100.0000 mg | ORAL_CAPSULE | Freq: Three times a day (TID) | ORAL | Status: DC

## 2012-06-01 ENCOUNTER — Ambulatory Visit: Payer: Medicare Other | Attending: Orthopedic Surgery | Admitting: Physical Therapy

## 2012-06-01 DIAGNOSIS — M25619 Stiffness of unspecified shoulder, not elsewhere classified: Secondary | ICD-10-CM | POA: Insufficient documentation

## 2012-06-01 DIAGNOSIS — IMO0001 Reserved for inherently not codable concepts without codable children: Secondary | ICD-10-CM | POA: Insufficient documentation

## 2012-06-01 DIAGNOSIS — M25519 Pain in unspecified shoulder: Secondary | ICD-10-CM | POA: Insufficient documentation

## 2012-06-02 ENCOUNTER — Other Ambulatory Visit: Payer: Self-pay | Admitting: Family Medicine

## 2012-06-02 DIAGNOSIS — Z1231 Encounter for screening mammogram for malignant neoplasm of breast: Secondary | ICD-10-CM

## 2012-06-03 ENCOUNTER — Ambulatory Visit: Payer: Medicare Other | Admitting: Physical Therapy

## 2012-06-12 ENCOUNTER — Institutional Professional Consult (permissible substitution): Payer: Medicare Other | Admitting: Internal Medicine

## 2012-06-23 ENCOUNTER — Institutional Professional Consult (permissible substitution): Payer: Medicare Other | Admitting: Pulmonary Disease

## 2012-07-13 ENCOUNTER — Ambulatory Visit: Payer: Medicare Other

## 2012-07-24 ENCOUNTER — Ambulatory Visit: Payer: Medicare Other

## 2012-10-12 ENCOUNTER — Encounter (HOSPITAL_COMMUNITY): Payer: Self-pay | Admitting: Emergency Medicine

## 2012-10-12 ENCOUNTER — Emergency Department (HOSPITAL_COMMUNITY): Payer: Medicare Other

## 2012-10-12 ENCOUNTER — Inpatient Hospital Stay (HOSPITAL_COMMUNITY)
Admission: EM | Admit: 2012-10-12 | Discharge: 2012-10-14 | DRG: 066 | Disposition: A | Discharge status: Home / Self Care | Payer: Medicare Other | Attending: Internal Medicine | Admitting: Internal Medicine

## 2012-10-12 DIAGNOSIS — R471 Dysarthria and anarthria: Secondary | ICD-10-CM | POA: Diagnosis present

## 2012-10-12 DIAGNOSIS — E785 Hyperlipidemia, unspecified: Secondary | ICD-10-CM | POA: Diagnosis present

## 2012-10-12 DIAGNOSIS — R4701 Aphasia: Secondary | ICD-10-CM

## 2012-10-12 DIAGNOSIS — I6340 Cerebral infarction due to embolism of unspecified cerebral artery: Principal | ICD-10-CM | POA: Diagnosis present

## 2012-10-12 DIAGNOSIS — I10 Essential (primary) hypertension: Secondary | ICD-10-CM | POA: Diagnosis present

## 2012-10-12 DIAGNOSIS — E119 Type 2 diabetes mellitus without complications: Secondary | ICD-10-CM

## 2012-10-12 DIAGNOSIS — D320 Benign neoplasm of cerebral meninges: Secondary | ICD-10-CM | POA: Diagnosis present

## 2012-10-12 DIAGNOSIS — Z8673 Personal history of transient ischemic attack (TIA), and cerebral infarction without residual deficits: Secondary | ICD-10-CM

## 2012-10-12 DIAGNOSIS — R479 Unspecified speech disturbances: Secondary | ICD-10-CM

## 2012-10-12 DIAGNOSIS — Z87891 Personal history of nicotine dependence: Secondary | ICD-10-CM

## 2012-10-12 DIAGNOSIS — R5381 Other malaise: Secondary | ICD-10-CM | POA: Diagnosis present

## 2012-10-12 DIAGNOSIS — R4789 Other speech disturbances: Secondary | ICD-10-CM | POA: Diagnosis present

## 2012-10-12 LAB — COMPREHENSIVE METABOLIC PANEL
ALT: 6 U/L (ref 0–35)
Alkaline Phosphatase: 36 U/L — ABNORMAL LOW (ref 39–117)
CO2: 27 mEq/L (ref 19–32)
Chloride: 102 mEq/L (ref 96–112)
GFR calc Af Amer: 60 mL/min — ABNORMAL LOW (ref >90)
GFR calc non Af Amer: 52 mL/min — ABNORMAL LOW (ref >90)
Glucose, Bld: 138 mg/dL — ABNORMAL HIGH (ref 70–99)
Potassium: 3 mEq/L — ABNORMAL LOW (ref 3.5–5.1)
Sodium: 141 mEq/L (ref 135–145)
Total Bilirubin: 0.4 mg/dL (ref 0.3–1.2)
Total Protein: 6.5 g/dL (ref 6.0–8.3)

## 2012-10-12 LAB — POCT I-STAT, CHEM 8
BUN: 19 mg/dL (ref 6–23)
Calcium, Ion: 1.34 mmol/L — ABNORMAL HIGH (ref 1.13–1.30)
Chloride: 104 mEq/L (ref 96–112)
Creatinine, Ser: 1 mg/dL (ref 0.50–1.10)
Sodium: 143 mEq/L (ref 135–145)

## 2012-10-12 LAB — PROTIME-INR
INR: 1.11 (ref 0.00–1.49)
Prothrombin Time: 14.2 seconds (ref 11.6–15.2)

## 2012-10-12 LAB — CBC
Hemoglobin: 11.3 g/dL — ABNORMAL LOW (ref 12.0–15.0)
Platelets: 286 10*3/uL (ref 150–400)
RBC: 3.8 MIL/uL — ABNORMAL LOW (ref 3.87–5.11)
WBC: 7.5 10*3/uL (ref 4.0–10.5)

## 2012-10-12 LAB — DIFFERENTIAL
Lymphocytes Relative: 37 % (ref 12–46)
Lymphs Abs: 2.8 10*3/uL (ref 0.7–4.0)
Neutrophils Relative %: 47 % (ref 43–77)

## 2012-10-12 LAB — POCT I-STAT TROPONIN I: Troponin i, poc: 0 ng/mL (ref 0.00–0.08)

## 2012-10-12 NOTE — ED Notes (Signed)
Dr. Leonel Ramsay discussed treatment options with pt.. Pt. Alert and oriented.

## 2012-10-12 NOTE — ED Notes (Signed)
Pt. Arrival in CT

## 2012-10-12 NOTE — H&P (Signed)
Chief Complaint:  Slurred speech  HPI: 78 yo female with h/o cva in the last year with residual affects of sometimes speech difficulty comes in today after a friend noted that she was having some difficulty speaking that seemed worse than normal.  She lives at home alone.  Speech is back to normal now.  Friend who witnessed this is not here now.  Pt is back to her baseline.  She denies any fevers/n/v/d.  No cp no sob no abd pain.  No weakness in her legs or arms.  No facial weakness.  Has mild headache no vision changes.    Review of Systems:  O/w neg  Past Medical History: Past Medical History  Diagnosis Date  . Ectopic pregnancy   . Diabetes mellitus without complication    Past Surgical History  Procedure Date  . Abdominal hysterectomy   . Breast reduction surgery   . Laparoscopic salpingoopherectomy   . Appendectomy     Medications: Prior to Admission medications   Medication Sig Start Date End Date Taking? Authorizing Provider  albuterol (PROVENTIL HFA;VENTOLIN HFA) 108 (90 BASE) MCG/ACT inhaler Inhale 1-2 puffs into the lungs every 6 (six) hours as needed. For shortness of breath and wheezing   Yes Historical Provider, MD  buPROPion (WELLBUTRIN) 75 MG tablet Take 75 mg by mouth daily.   Yes Historical Provider, MD  Calcium Carbonate-Vitamin D (CALTRATE 600+D) 600-400 MG-UNIT per tablet Take 2 tablets by mouth daily.   Yes Historical Provider, MD  Cinnamon 500 MG capsule Take 1,000 mg by mouth daily.   Yes Historical Provider, MD  Coenzyme Q10 (CO Q 10) 100 MG CAPS Take 1 tablet by mouth daily.   Yes Historical Provider, MD  denosumab (PROLIA) 60 MG/ML SOLN injection Inject 60 mg into the skin every 6 (six) months. Administer in upper arm, thigh, or abdomen   Yes Historical Provider, MD  fenofibrate (TRICOR) 145 MG tablet Take 145 mg by mouth daily.   Yes Historical Provider, MD  fish oil-omega-3 fatty acids 1000 MG capsule Take 1 g by mouth daily.   Yes Historical Provider,  MD  fluticasone (FLONASE) 50 MCG/ACT nasal spray Place 1 spray into the nose 2 (two) times daily.   Yes Historical Provider, MD  Glucosamine-Chondroitin (OSTEO BI-FLEX REGULAR STRENGTH) 250-200 MG TABS Take 1 tablet by mouth daily.   Yes Historical Provider, MD  hydrochlorothiazide (HYDRODIURIL) 25 MG tablet Take 25 mg by mouth daily.   Yes Historical Provider, MD  metoprolol succinate (TOPROL-XL) 50 MG 24 hr tablet Take 50 mg by mouth daily. Take with or immediately following a meal.   Yes Historical Provider, MD  Multiple Vitamin (MULITIVITAMIN WITH MINERALS) TABS Take 1 tablet by mouth daily.   Yes Historical Provider, MD  omeprazole (PRILOSEC) 20 MG capsule Take 20 mg by mouth daily.   Yes Historical Provider, MD  pravastatin (PRAVACHOL) 80 MG tablet Take 80 mg by mouth at bedtime.   Yes Historical Provider, MD  vitamin B-12 (CYANOCOBALAMIN) 1000 MCG tablet Take 1,000 mcg by mouth daily.   Yes Historical Provider, MD    Allergies:   Allergies  Allergen Reactions  . Ace Inhibitors     unknown  . Penicillins     unknown  . Sulfa Antibiotics     unknown    Social History:  reports that she has quit smoking. She has never used smokeless tobacco. She reports that she does not drink alcohol or use illicit drugs.  Family History: neg  Physical  Exam: Filed Vitals:   10/12/12 2220  BP: 198/63  Pulse: 67  Resp: 16  SpO2: 97%   General appearance: alert, cooperative and no distress Neck: no JVD and supple, symmetrical, trachea midline Lungs: clear to auscultation bilaterally Heart: regular rate and rhythm, S1, S2 normal, no murmur, click, rub or gallop Abdomen: soft, non-tender; bowel sounds normal; no masses,  no organomegaly Extremities: extremities normal, atraumatic, no cyanosis or edema Pulses: 2+ and symmetric Skin: Skin color, texture, turgor normal. No rashes or lesions Neurologic: Mental status: Alert, oriented, thought content appropriate Cranial nerves:  normal Motor: grossly normal Reflexes: normal 2+ and symmetric    Labs on Admission:   [facility] 10/12/12 2238 10/12/12 2227  NA 141 143  K 3.0* 3.0*  CL 102 104  CO2 27 --  GLUCOSE 138* 135*  BUN 18 19  CREATININE 0.99 1.00  CALCIUM 10.6* --  MG -- --  PHOS -- --    Basename 10/12/12 2238  AST 22  ALT 6  ALKPHOS 36*  BILITOT 0.4  PROT 6.5  ALBUMIN 3.5    Basename 10/12/12 2238 10/12/12 2227  WBC 7.5 --  NEUTROABS 3.6 --  HGB 11.3* 11.6*  HCT 34.2* 34.0*  MCV 90.0 --  PLT 286 --    Basename 10/12/12 2238  CKTOTAL --  CKMB --  CKMBINDEX --  TROPONINI <0.30   Radiological Exams on Admission: Ct Head Wo Contrast  10/12/2012  *RADIOLOGY REPORT*  Clinical Data: Code stroke, altered mental status.  CT HEAD WITHOUT CONTRAST  Technique:  Contiguous axial images were obtained from the base of the skull through the vertex without contrast.  Comparison: None.  Findings: Prominence of the sulci, cisterns, and ventricles, in keeping with volume loss. There are subcortical and periventricular white matter hypodensities, a nonspecific finding most often seen with chronic microangiopathic changes. More confluent hypoattenuation within the right centrum semiovale parietal lobe may reflect sequelae of prior ischemia Calcification along the left frontal lateral convexity measures 2 cm in diameter and may reflect dural calcification or meningioma.  There is no evidence for acute hemorrhage, overt hydrocephalus, or abnormal extra-axial fluid collection.  No definite CT evidence for acute cortical based (large artery) infarction.  IMPRESSION: No CT evidence for acute infarction.  White matter changes and question prior right parietal lobe insult.  MRI has increased sensitivity for acute ischemia if clinical concern persists.  Dural calcification versus meningioma along the left convexity.   Original Report Authenticated By: Carlos Levering, M.D.     Assessment/Plan 78 yo femaale with  h/o cva with question of new tia/cva  Principal Problem:  *Difficulty speaking Active Problems:  Diabetes mellitus without complication  H/O: CVA (cerebrovascular accident)  HTN (hypertension)  obs to obtain mri in am.  Code stroke was called and this has been stopped by neurology.  Neurology consulted.  Pt on plavix, cont that.  neruo cks overnigth.  Further w/u per neuro.  Clarify home meds.  Replace k.    Trichelle Lehan A 10/12/2012, 11:44 PM

## 2012-10-12 NOTE — ED Notes (Signed)
Patient arrived to ED

## 2012-10-12 NOTE — ED Notes (Signed)
CT read by neurologist (Dr. Leonel Ramsay)

## 2012-10-12 NOTE — ED Notes (Addendum)
Friend came over to patient's house and pt. Started talking strangely and had reported expressive aphasia however symptoms resolved upon [facility] arrival.  On arrival, pt. Was using walker and brushing her teeth.  [facility] saw no physical symptoms nor slurred speech although systolic BP was [id number].  and she had a transient episode where it was reported that her speech was garbled and didn't not make sense.  Last time seen normal not clear upon report.  Pt. Reports headache for several days. Hx. Of CVA.  Pt. Alert and oriented on transport to CT scanner.  Code stroke activated at 2200.

## 2012-10-12 NOTE — ED Notes (Signed)
EDP exam

## 2012-10-12 NOTE — ED Provider Notes (Signed)
History     CSN: [id number]  Arrival date & time 10/12/12  2218   First MD Initiated Contact with Patient 10/12/12 2219    Chief Complaint  Patient presents with  . Code Stroke   HPI: Mrs. Charlene Wade is an 78 yo CF with history of DM and previous CVA who presents with expressive aphasia prior to arrival. She was in her normal state of health upon waking this morning. She endorsed having a frontal headache for several days which continued today. A friend came to visit her this evening and she developed expressive aphasia. These symptoms persisted but she did not seek care immediately because she felt these symptoms would resolve. However, symptoms persisted for an unknown period of time, therefore EMS was called. On their arrival she ws normal. Enroute one additional episode of expressive aphasia was noted therefore code stroke activated.   Past Medical History  Diagnosis Date  . Ectopic pregnancy   . Diabetes mellitus without complication    Past Surgical History  Procedure Date  . Abdominal hysterectomy   . Breast reduction surgery   . Laparoscopic salpingoopherectomy   . Appendectomy     No family history on file.  History  Substance Use Topics  . Smoking status: Former Research scientist (life sciences)  . Smokeless tobacco: Never Used  . Alcohol Use: No    OB History    Grav Para Term Preterm Abortions TAB SAB Ect Mult Living                  Review of Systems  Constitutional: Negative for chills, diaphoresis and fatigue.  HENT: Negative for congestion, rhinorrhea, neck pain and neck stiffness.   Eyes: Negative for photophobia and visual disturbance.  Respiratory: Negative for cough, choking and shortness of breath.   Cardiovascular: Negative for chest pain and palpitations.  Gastrointestinal: Negative for nausea, vomiting and abdominal pain.  Genitourinary: Negative for dysuria, urgency and decreased urine volume.  Musculoskeletal: Negative for back pain and joint swelling.  Skin: Negative  for pallor and rash.  Neurological: Positive for speech difficulty and headaches. Negative for dizziness, syncope and weakness.  Psychiatric/Behavioral: Negative for confusion and agitation.    Allergies  Ace inhibitors; Penicillins; and Sulfa antibiotics  Home Medications   Current Outpatient Rx  Name  Route  Sig  Dispense  Refill  . ALBUTEROL SULFATE HFA 108 (90 BASE) MCG/ACT IN AERS   Inhalation   Inhale 1-2 puffs into the lungs every 6 (six) hours as needed. For shortness of breath and wheezing         . ASPIRIN 81 MG PO TABS   Oral   Take 160 mg by mouth daily.         . BUPROPION HCL 75 MG PO TABS   Oral   Take 75 mg by mouth daily.         Marland Kitchen CALCIUM CARBONATE-VITAMIN D 600-400 MG-UNIT PO TABS   Oral   Take 2 tablets by mouth daily.         Marland Kitchen CICLOPIROX 8 % EX SOLN   Topical   Apply topically at bedtime. Apply over nail and surrounding skin. Apply daily over previous coat. After seven (7) days, may remove with alcohol and continue cycle.         Marland Kitchen CINNAMON 500 MG PO CAPS   Oral   Take 1,000 mg by mouth daily.         . CO Q 10 100 MG PO CAPS   Oral  Take 1 tablet by mouth daily.         . DENOSUMAB 60 MG/ML  SOLN   Subcutaneous   Inject 60 mg into the skin every 6 (six) months. Administer in upper arm, thigh, or abdomen         . FENOFIBRATE 145 MG PO TABS   Oral   Take 145 mg by mouth daily.         . OMEGA-3 FATTY ACIDS 1000 MG PO CAPS   Oral   Take 1 g by mouth daily.         Marland Kitchen FLUTICASONE PROPIONATE 50 MCG/ACT NA SUSP   Nasal   Place 1 spray into the nose 2 (two) times daily.         . FUROSEMIDE 40 MG PO TABS   Oral   Take 40 mg by mouth daily.         Marland Kitchen GABAPENTIN 100 MG PO CAPS   Oral   Take 1 capsule (100 mg total) by mouth 3 (three) times daily.   90 capsule   1   . GLUCOSAMINE-CHONDROITIN 250-200 MG PO TABS   Oral   Take 1 tablet by mouth daily.         Marland Kitchen GREEN TEA (CAMILLIA SINENSIS) 1000 MG PO TABS    Oral   Take 1,000 mg by mouth daily.         Marland Kitchen LOSARTAN POTASSIUM-HCTZ 50-12.5 MG PO TABS   Oral   Take 1 tablet by mouth daily.         Marland Kitchen METOPROLOL SUCCINATE ER 50 MG PO TB24   Oral   Take 50 mg by mouth daily. Take with or immediately following a meal.         . ADULT MULTIVITAMIN W/MINERALS CH   Oral   Take 1 tablet by mouth daily.         Marland Kitchen OMEPRAZOLE 20 MG PO CPDR   Oral   Take 20 mg by mouth daily.         Marland Kitchen PRAVASTATIN SODIUM 80 MG PO TABS   Oral   Take 80 mg by mouth at bedtime.         . TRAMADOL HCL 50 MG PO TABS   Oral   Take 50 mg by mouth 2 (two) times daily as needed. For pain         . VITAMIN B-12 1000 MCG PO TABS   Oral   Take 1,000 mcg by mouth daily.           BP 190/63  Pulse 61  Temp 98 F (36.7 C)  Resp 19  SpO2 94%  Physical Exam  Nursing note and vitals reviewed. Constitutional: She is oriented to person, place, and time. She is cooperative. No distress.       Overweight, elderly female. Pleasant, speech coherent, no aphasia.   HENT:  Head: Normocephalic and atraumatic.  Mouth/Throat: Oropharynx is clear and moist and mucous membranes are normal.  Eyes: Conjunctivae normal and EOM are normal. Pupils are equal, round, and reactive to light.  Neck: Neck supple. No JVD present.  Cardiovascular: Normal rate, regular rhythm, S1 normal and normal heart sounds.  Exam reveals no decreased pulses.   Pulmonary/Chest: Effort normal and breath sounds normal. She has no decreased breath sounds.  Abdominal: Soft. Normal appearance and bowel sounds are normal. There is no tenderness. There is no CVA tenderness.  Musculoskeletal: Normal range of motion. She exhibits edema (Trace LE  edema).  Neurological: She is alert and oriented to person, place, and time. She has normal strength and normal reflexes. No cranial nerve deficit or sensory deficit. GCS eye subscore is 4. GCS verbal subscore is 5. GCS motor subscore is 6.    ED Course   Procedures  Labs Reviewed  POCT I-STAT, CHEM 8 - Abnormal; Notable for the following:    Potassium 3.0 (*)     Glucose, Bld 135 (*)     Calcium, Ion 1.34 (*)     Hemoglobin 11.6 (*)     HCT 34.0 (*)     All other components within normal limits  APTT - Abnormal; Notable for the following:    aPTT 41 (*)     All other components within normal limits  CBC - Abnormal; Notable for the following:    RBC 3.80 (*)     Hemoglobin 11.3 (*)     HCT 34.2 (*)     All other components within normal limits  DIFFERENTIAL - Abnormal; Notable for the following:    Monocytes Relative 13 (*)     All other components within normal limits  COMPREHENSIVE METABOLIC PANEL - Abnormal; Notable for the following:    Potassium 3.0 (*)     Glucose, Bld 138 (*)     Calcium 10.6 (*)     Alkaline Phosphatase 36 (*)     GFR calc non Af Amer 52 (*)     GFR calc Af Amer 60 (*)     All other components within normal limits  PROTIME-INR  TROPONIN I  POCT I-STAT TROPONIN I  URINE RAPID DRUG SCREEN (HOSP PERFORMED)   Ct Head Wo Contrast  10/12/2012  *RADIOLOGY REPORT*  Clinical Data: Code stroke, altered mental status.  CT HEAD WITHOUT CONTRAST  Technique:  Contiguous axial images were obtained from the base of the skull through the vertex without contrast.  Comparison: None.  Findings: Prominence of the sulci, cisterns, and ventricles, in keeping with volume loss. There are subcortical and periventricular white matter hypodensities, a nonspecific finding most often seen with chronic microangiopathic changes. More confluent hypoattenuation within the right centrum semiovale parietal lobe may reflect sequelae of prior ischemia Calcification along the left frontal lateral convexity measures 2 cm in diameter and may reflect dural calcification or meningioma.  There is no evidence for acute hemorrhage, overt hydrocephalus, or abnormal extra-axial fluid collection.  No definite CT evidence for acute cortical based (large  artery) infarction.  IMPRESSION: No CT evidence for acute infarction.  White matter changes and question prior right parietal lobe insult.  MRI has increased sensitivity for acute ischemia if clinical concern persists.  Dural calcification versus meningioma along the left convexity.   Original Report Authenticated By: Carlos Levering, M.D.     1. Expressive aphasia   2. Diabetes mellitus without complication   3. Difficulty speaking   4. H/O: CVA (cerebrovascular accident)   5. HTN (hypertension)    MDM  78 yo CF with history of DM and previous CVA who presents with expressive aphasia prior to arrival. Unknown exact timing of symptoms. Hypertensive on arrival. Symptoms resolved prior to arrival. Taken to CT which was negative for acute abnormality. Neurology at bedside on patient arrival, they do not recommend acute intervention as symptoms resolved. ECG without acute ischemia. Labs: K 3.0, Ca 10.6, Cr 0.9, troponin normal, INR 1.1, Hgb 11.3, WBC 7.5. Patient admitted to Hospitalist service in stable condition. Permissive hypertension given symptoms. Headache treated with  tylenol. Doubt her HA represents SAH as it was gradual in onset, present for several days, no neck pain, has had similar HA several times.   ECG: SR, rate 67, normal axis, prolonged QTc, no ischemia.   Reviewed imaging, labs and previous medical records, utilized in MDM  Discussed case with Dr. Christy Gentles  Clinical Impression 1. Expressive aphasia, resolved       Louretta Shorten, MD 10/13/12 [phone]

## 2012-10-13 ENCOUNTER — Encounter (HOSPITAL_COMMUNITY): Payer: Self-pay | Admitting: *Deleted

## 2012-10-13 DIAGNOSIS — Z8673 Personal history of transient ischemic attack (TIA), and cerebral infarction without residual deficits: Secondary | ICD-10-CM

## 2012-10-13 DIAGNOSIS — I517 Cardiomegaly: Secondary | ICD-10-CM

## 2012-10-13 DIAGNOSIS — E785 Hyperlipidemia, unspecified: Secondary | ICD-10-CM

## 2012-10-13 DIAGNOSIS — E119 Type 2 diabetes mellitus without complications: Secondary | ICD-10-CM

## 2012-10-13 DIAGNOSIS — R4789 Other speech disturbances: Secondary | ICD-10-CM

## 2012-10-13 DIAGNOSIS — I10 Essential (primary) hypertension: Secondary | ICD-10-CM

## 2012-10-13 LAB — CBC
HCT: 33.9 % — ABNORMAL LOW (ref 36.0–46.0)
MCHC: 32.7 g/dL (ref 30.0–36.0)
Platelets: 265 10*3/uL (ref 150–400)
RDW: 14.2 % (ref 11.5–15.5)
WBC: 7.2 10*3/uL (ref 4.0–10.5)

## 2012-10-13 LAB — RAPID URINE DRUG SCREEN, HOSP PERFORMED
Barbiturates: NOT DETECTED
Benzodiazepines: NOT DETECTED

## 2012-10-13 LAB — GLUCOSE, CAPILLARY: Glucose-Capillary: 114 mg/dL — ABNORMAL HIGH (ref 70–99)

## 2012-10-13 LAB — BASIC METABOLIC PANEL
BUN: 17 mg/dL (ref 6–23)
Chloride: 106 mEq/L (ref 96–112)
Creatinine, Ser: 0.91 mg/dL (ref 0.50–1.10)
GFR calc Af Amer: 66 mL/min — ABNORMAL LOW (ref >90)
GFR calc non Af Amer: 57 mL/min — ABNORMAL LOW (ref >90)

## 2012-10-13 LAB — HEMOGLOBIN A1C
Hgb A1c MFr Bld: 5.7 % — ABNORMAL HIGH (ref <5.7)
Mean Plasma Glucose: 117 mg/dL — ABNORMAL HIGH (ref <117)

## 2012-10-13 MED ORDER — SIMVASTATIN 40 MG PO TABS
40.0000 mg | ORAL_TABLET | Freq: Every day | ORAL | Status: DC
  Administered 2012-10-13: 40 mg via ORAL
  Filled 2012-10-13 (×2): qty 1

## 2012-10-13 MED ORDER — ASPIRIN EC 81 MG PO TBEC
81.0000 mg | DELAYED_RELEASE_TABLET | Freq: Every day | ORAL | Status: DC
  Administered 2012-10-13: 81 mg via ORAL
  Filled 2012-10-13: qty 1

## 2012-10-13 MED ORDER — ACETAMINOPHEN 325 MG PO TABS
650.0000 mg | ORAL_TABLET | Freq: Four times a day (QID) | ORAL | Status: DC | PRN
  Administered 2012-10-13 (×2): 650 mg via ORAL
  Filled 2012-10-13 (×2): qty 2

## 2012-10-13 MED ORDER — VITAMIN B-12 1000 MCG PO TABS
1000.0000 ug | ORAL_TABLET | Freq: Every day | ORAL | Status: DC
  Administered 2012-10-13 – 2012-10-14 (×2): 1000 ug via ORAL
  Filled 2012-10-13 (×2): qty 1

## 2012-10-13 MED ORDER — CLOPIDOGREL BISULFATE 75 MG PO TABS
75.0000 mg | ORAL_TABLET | Freq: Every day | ORAL | Status: DC
  Administered 2012-10-13 – 2012-10-14 (×2): 75 mg via ORAL
  Filled 2012-10-13 (×2): qty 1

## 2012-10-13 MED ORDER — FENOFIBRATE 160 MG PO TABS
160.0000 mg | ORAL_TABLET | Freq: Every day | ORAL | Status: DC
  Administered 2012-10-13 – 2012-10-14 (×2): 160 mg via ORAL
  Filled 2012-10-13 (×2): qty 1

## 2012-10-13 MED ORDER — OMEGA-3-ACID ETHYL ESTERS 1 G PO CAPS
1.0000 g | ORAL_CAPSULE | Freq: Every day | ORAL | Status: DC
  Administered 2012-10-13 – 2012-10-14 (×2): 1 g via ORAL
  Filled 2012-10-13 (×2): qty 1

## 2012-10-13 MED ORDER — SODIUM CHLORIDE 0.9 % IV SOLN
INTRAVENOUS | Status: AC
  Administered 2012-10-13: 03:00:00 via INTRAVENOUS

## 2012-10-13 MED ORDER — POTASSIUM CHLORIDE 20 MEQ PO PACK
20.0000 meq | PACK | Freq: Two times a day (BID) | ORAL | Status: DC
  Filled 2012-10-13: qty 1

## 2012-10-13 MED ORDER — WHITE PETROLATUM GEL
Status: AC
  Filled 2012-10-13: qty 5

## 2012-10-13 MED ORDER — SODIUM CHLORIDE 0.9 % IJ SOLN
3.0000 mL | Freq: Two times a day (BID) | INTRAMUSCULAR | Status: DC
  Administered 2012-10-13 – 2012-10-14 (×3): 3 mL via INTRAVENOUS

## 2012-10-13 MED ORDER — POTASSIUM CHLORIDE CRYS ER 20 MEQ PO TBCR
20.0000 meq | EXTENDED_RELEASE_TABLET | Freq: Two times a day (BID) | ORAL | Status: DC
  Administered 2012-10-13 – 2012-10-14 (×4): 20 meq via ORAL
  Filled 2012-10-13 (×6): qty 1

## 2012-10-13 NOTE — Consult Note (Addendum)
Reason for Consult:Aphasia Referring Physician: Christy Gentles, D  CC: Difficulty speaking  History is obtained from:Patient, sister  HPI: Charlene Wade is a 78 y.o. female who was at dinner with a sitter when she was noted to have difficulty speaking. She was "garbling" her words, but states that she never had a period wher she could not understand. EMS was called. It is unclear as to exact time of onset as patient gives different accounts, but the most reliable time I feel is approximately 8pm. She had a transient episode of being unable to speak, then cleared, and subsequently had another few minutes of difficulty speaking. She currently still has mild difficulty.   LSN: 8 pm tpa given: no, rapidly improving symptoms NIHSS: 1   ROS: A 14 point ROS was performed and is negative except as noted in the HPI.  Past Medical History  Diagnosis Date  . Ectopic pregnancy   . Diabetes mellitus without complication     Family History: No history of stroke  Social History: Tob: Former smoker  Exam: Current vital signs: BP 198/63  Pulse 67  Resp 16  SpO2 97% Vital signs in last 24 hours: Pulse Rate:  [67] 67  (12/16 2220) Resp:  [16] 16  (12/16 2220) BP: (198)/(63) 198/63 mmHg (12/16 2220) SpO2:  [97 %] 97 % (12/16 2220)  General: in bed, NAD CV: RRR Mental Status: Patient is awake, alert, oriented to person, place, month, year, and situation. Patient is able to give a clear and coherent history, though is unclear about times.  She is able to name and repeat, however she does make paraphasic errors intermittently during the exam.  Cranial Nerves: II: Visual Fields are full. Pupils are equal, round, and reactive to light.  Discs are difficult to visualize. III,IV, VI: EOMI without ptosis or diploplia.  V: Facial sensation is symmetric to temperature VII: Facial movement is symmetric.  VIII: hearing is intact to voice X: Uvula elevates symmetrically XI: Shoulder shrug is  symmetric. XII: tongue is midline without atrophy or fasciculations.  Motor: Tone is normal. Bulk is normal. 5/5 strength was present in all four extremities.  Sensory: Sensation is symmetric to light touch and temperature in the arms and legs. Deep Tendon Reflexes: 2+ and symmetric in the biceps and patellae.  Plantars: Toes are downgoing bilaterally.  Cerebellar: FNF intact bilaterally Gait: Did not assess due to multiple monitors in ER setting  I have reviewed labs in epic and the results pertinent to this consultation are: Mildly low K, nml INR  I have reviewed the images obtained:CT head - previous right parietal stroke  Impression: 78 yo F with likely distal branch left MCA infarct. Her rapid improvement was the reason for not giving tpa, though with her BP, I suspect she has had some residual infarct. I would be very cautious about lowering BP, and would allow permissive htn up to [id number] systolic or 123456 diastolic. This is likely a contralateral infarct, suggestive of cardiac source of embolus.   Recommendations: 1. HgbA1c, fasting lipid panel 2. MRI, MRA  of the brain without contrast 3. PT consult, OT consult, Speech consult 4. Echocardiogram 5. Carotid dopplers 6. Prophylactic therapy-Antiplatelet med: Plavix 75 mg 7. Risk factor modification 8. Telemetry monitoring 9. Frequent neuro checks   Roland Rack, MD [facility] [phone]  If 7pm- 7am, please page neurology on call at [phone].

## 2012-10-13 NOTE — Evaluation (Signed)
Physical Therapy Evaluation Patient Details Name: Charlene Wade MRN: [id number] DOB: Nov 20, 1927 Today's Date: 10/13/2012 Time: [id number] PT Time Calculation (min): 36 min  PT Assessment / Plan / Recommendation Clinical Impression  Pt is a pleasent 78 y.o. female admitted for slurred speech with hx of CVA. Patient symptoms have resolved and states that she feels close to her baseline for mobility.  Patient does present with some functional deficits for which she has successfully developed compensatory strategies for.  Patient does live alone with intermittant assistance. Patient will benefit from continued skilled PT to maximize function acutely and may benefit from continued PT with home health PT vs SNF pending progress.  Pt last experience at SNF pt acquired CDIFF as well as a UTI which created set backs in recovery.  Question if Home health PT may be more beneficial for this patient. Will continue to monitor and progress activity as tolerated.  Will bring rollator for use to mimic home use to make comparisons for mobility.    PT Assessment  Patient needs continued PT services    Follow Up Recommendations  Home health PT (vs SNF pending OT consult and further evaluation)       Barriers to Discharge Decreased caregiver support      Equipment Recommendations       Recommendations for Other Services OT consult   Frequency Min 4X/week    Precautions / Restrictions     Pertinent Vitals/Pain Mild headache but no other pain or pain rating provided      Mobility  Bed Mobility Bed Mobility: Not assessed Transfers Transfers: Sit to Stand;Stand to Sit Sit to Stand: 5: Supervision Stand to Sit: 5: Supervision Details for Transfer Assistance: Patient required increased time but was able to perform using rw with no assist Ambulation/Gait Ambulation/Gait Assistance: 5: Supervision Ambulation Distance (Feet): 60 Feet Assistive device: Rolling walker Ambulation/Gait Assistance  Details: patient slow but steady; uses rollator at home which may be easier to manuever then rubber tipped walker Gait Pattern: Step-through pattern;Decreased stride length;Trunk flexed Gait velocity: decreased General Gait Details: patient is steady with ambulation despite significantly decreased cadence           PT Diagnosis: Generalized weakness  PT Problem List: Decreased activity tolerance;Decreased mobility;Decreased balance PT Treatment Interventions: Functional mobility training;Balance training   PT Goals Acute Rehab PT Goals PT Goal Formulation: With patient Time For Goal Achievement: 10/17/12 Potential to Achieve Goals: Good Pt will go Sit to Stand: with modified independence PT Goal: Sit to Stand - Progress: Goal set today Pt will go Stand to Sit: with modified independence PT Goal: Stand to Sit - Progress: Goal set today Pt will Ambulate: >150 feet;with modified independence;with rolling walker PT Goal: Ambulate - Progress: Goal set today  Visit Information  Last PT Received On: 10/13/12 Assistance Needed: +1    Subjective Data  Subjective: I am very tired I was up all night Patient Stated Goal: to go home   Prior [place] Lives With: Alone Available Help at Discharge: Family;Available PRN/intermittently (available on occassion) Type of Home: House (townhouse) Home Access: Level entry Home Layout: One level Biochemist, clinical: Standard Home Adaptive Equipment: Walker - rolling Prior Function Level of Independence: Independent with assistive device(s) Driving: No Vocation: Retired 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 Dominant Hand: Right    Cognition  Overall Cognitive Status: Appears within functional limits for tasks assessed/performed Arousal/Alertness: Lethargic Orientation Level: Oriented X4 / Intact;Appears intact for tasks assessed Behavior During Session: [facility] for tasks  performed    Extremity/Trunk Assessment Right Upper  Extremity Assessment RUE ROM/Strength/Tone: [facility] for tasks assessed Left Upper Extremity Assessment LUE ROM/Strength/Tone: WFL for tasks assessed Right Lower Extremity Assessment RLE ROM/Strength/Tone: [facility] for tasks assessed Left Lower Extremity Assessment LLE ROM/Strength/Tone: WFL for tasks assessed Trunk Assessment Trunk Assessment: Kyphotic   Balance Balance Balance Assessed: Yes Dynamic Standing Balance Dynamic Standing - Balance Support: During functional activity Dynamic Standing - Level of Assistance: 6: Modified independent (Device/Increase time) Dynamic Standing - Balance Activities: Lateral lean/weight shifting;Forward lean/weight shifting;Reaching for objects Dynamic Standing - Comments: patient is steady with tasks at sink and counter  High Level Balance High Level Balance Activites: Side stepping;Backward walking;Direction changes;Turns;Head turns High Level Balance Comments: patient able to perform without assistance but is very slow with all aspects of mobility  End of Session PT - End of Session Equipment Utilized During Treatment: Gait belt Activity Tolerance: Patient limited by fatigue  GP     Duncan Dull 10/13/2012, 5:23 PM  Alben Deeds, [place] DPT  [phone]

## 2012-10-13 NOTE — Progress Notes (Signed)
[facility] PROGRESS NOTE  Charlene Wade [id number] DOB: 07-01-1928 DOA: 10/12/2012 PCP: Provider Not In System  Assessment/Plan: Dysarthria -Appears to have resolved -Appreciate neurology followup -Await MRI/MRAbrain -Await carotid ultrasound, echocardiogram -Await lipids and A1c -Continue Plavix, discontinue aspirin Diabetes mellitus2 -Await lipids and A1c Hypertension -Allow permissive hypertension for the first 24 hours -Patient previously on metoprolol succinate 50 mg daily -Discontinue HCTZ due to hypercalcemia Hypercalcemia - Likely due to hydrochlorothiazide although patient was on calcium supplementation as well as Prolia -judicious fluid Hyperlipidemia -Patient on Pravachol 80 mg at home -Start simvastatin 40 mg in the hospital -Restart home dose of Tri[employer] and omega-3 fatty acid Deconditioning -PT/OT -suspect will need SNF    Family Communication:   Pt at beside Disposition Plan:   Home when medically stable     Procedures/Studies: Ct Head Wo Contrast  10/12/2012  *RADIOLOGY REPORT*  Clinical Data: Code stroke, altered mental status.  CT HEAD WITHOUT CONTRAST  Technique:  Contiguous axial images were obtained from the base of the skull through the vertex without contrast.  Comparison: None.  Findings: Prominence of the sulci, cisterns, and ventricles, in keeping with volume loss. There are subcortical and periventricular white matter hypodensities, a nonspecific finding most often seen with chronic microangiopathic changes. More confluent hypoattenuation within the right centrum semiovale parietal lobe may reflect sequelae of prior ischemia Calcification along the left frontal lateral convexity measures 2 cm in diameter and may reflect dural calcification or meningioma.  There is no evidence for acute hemorrhage, overt hydrocephalus, or abnormal extra-axial fluid collection.  No definite CT evidence for acute cortical based (large artery) infarction.   IMPRESSION: No CT evidence for acute infarction.  White matter changes and question prior right parietal lobe insult.  MRI has increased sensitivity for acute ischemia if clinical concern persists.  Dural calcification versus meningioma along the left convexity.   Original Report Authenticated By: Carlos Levering, M.D.          Subjective: Patient feels that her speech is back to normal. Denies any worsening focal extremity weakness. Patient states "I'm weak all over". Denies any dizziness, chest discomfort, shortness breath, nausea, vomiting, diarrhea, abdominal pain, dysuria.  Objective: Filed Vitals:   10/13/12 0355 10/13/12 0358 10/13/12 0553 10/13/12 0830  BP: 187/80 197/53 202/67 200/65  Pulse: 68 70 76 73  Temp: 98 F (36.7 C)  98.3 F (36.8 C) 97.6 F (36.4 C)  TempSrc: Oral  Oral Oral  Resp: 18  18 20   Height:      Weight:      SpO2: 97%  95% 96%    Intake/Output Summary (Last 24 hours) at 10/13/12 0946 Last data filed at 10/13/12 0815  Gross per 24 hour  Intake 303.75 ml  Output   1025 ml  Net -721.25 ml   Weight change:  Exam:   General:  Pt is alert, follows commands appropriately, not in acute distress  HEENT: No icterus, No thrush, No neck mass, [place]/AT  Cardiovascular: RRR, S1/S2, no rubs, no gallops  Respiratory: CTA bilaterally, no wheezing, no crackles, no rhonchi  Abdomen: Soft/+BS, non tender, non distended, no guarding  Extremities: 2+ edema, No lymphangitis, No petechiae, No rashes, no synovitis  Neuro: PERRL, EOMI, no facial asymmetry, bilateral shoulder shrug intact, but in addition, posterior pharynx rises symmetrically. Strength 4/5 in bilateral upper extremities,4-/5 LLE, 4-RLE.  Data Reviewed: Basic Metabolic Panel:  Lab [id number] [id number] 10/12/12 2238 10/12/12 2227  NA 146* 141 143  K 3.0* 3.0* 3.0*  CL 106 102 104  CO2 28 27 --  GLUCOSE 100* 138* 135*  BUN 17 18 19   CREATININE 0.91 0.99 1.00  CALCIUM 10.3 10.6* --  MG -- -- --   PHOS -- -- --   Liver Function Tests:  Lab 10/12/12 2238  AST 22  ALT 6  ALKPHOS 36*  BILITOT 0.4  PROT 6.5  ALBUMIN 3.5   No results found for this basename: LIPASE:5,AMYLASE:5 in the last 168 hours No results found for this basename: AMMONIA:5 in the last 168 hours CBC:  Lab 10/13/12 0610 10/12/12 2238 10/12/12 2227  WBC 7.2 7.5 --  NEUTROABS -- 3.6 --  HGB 11.1* 11.3* 11.6*  HCT 33.9* 34.2* 34.0*  MCV 90.9 90.0 --  PLT 265 286 --   Cardiac Enzymes:  Lab 10/12/12 2238  CKTOTAL --  CKMB --  CKMBINDEX --  TROPONINI <0.30   BNP: No components found with this basename: POCBNP:5 CBG:  Lab 10/13/12 0230  GLUCAP 114*    No results found for this or any previous visit (from the past 240 hour(s)).   Scheduled Meds:   . aspirin EC  81 mg Oral Daily  . clopidogrel  75 mg Oral Q breakfast  . potassium chloride  20 mEq Oral BID  . sodium chloride  3 mL Intravenous Q12H   Continuous Infusions:   . sodium chloride 75 mL/hr at 10/13/12 0257     Maximilien Hayashi, DO  [facility] Pager [phone]  If 7PM-7AM, please contact night-coverage [email or web address] Password [email or web address] 10/13/2012, 9:46 AM   LOS: 1 day

## 2012-10-13 NOTE — Progress Notes (Signed)
Bilateral:  No evidence of hemodynamically significant internal carotid artery stenosis.   Vertebral artery flow is antegrade.

## 2012-10-13 NOTE — ED Provider Notes (Signed)
I have personally seen and examined the patient.  I have discussed the plan of care with the resident.  I have reviewed the documentation on PMH/FH/Soc. History.  I have reviewed the documentation of the resident and agree.  I have reviewed and agree with the ECG interpretation(s) documented by the resident.  Pt seen on arrival with resident and neurologist Dr Leonel Ramsay cancelled code stroke, no acute intervention recommend by neuro  Sharyon Cable, MD 10/13/12 1050

## 2012-10-13 NOTE — Code Documentation (Signed)
Patient was going out to dinner with a friend when the friend noted some garbled speech and the patient's inability to get some words out. There is some difficulty determining the exact LSN time, but it is approximately 2000. EMS arrived on scene and patient was having no difficulties with her speech, but then had another episode prior to arrival. Code stroke called at 2200, patient arrived at 2215, EDP exam at 2215, stroke team arrived at 2208, LSN 2000, patient arrived to [facility] at 2218, phlebotomist arrived at 2201, CT read by Dr. Leonel Ramsay at 2223. Initial NIH 01 for expressive aphasia, patient's aphasia quickly resolved during examination.

## 2012-10-13 NOTE — Progress Notes (Signed)
PT Cancellation Note  Patient Details Name: Charlene Wade MRN: [id number] DOB: 1928-02-01   Cancelled Treatment:    Reason Eval/Treat Not Completed: Patient at procedure or test/unavailable (Patient OTF for vascular study), will attempt back later today.   Duncan Dull 10/13/2012, 11:54 AM Alben Deeds, PT DPT  [phone]

## 2012-10-13 NOTE — ED Notes (Signed)
Pt ambulatory to bathroom with no difficulties

## 2012-10-13 NOTE — Progress Notes (Signed)
Stroke Team Progress Note  HISTORY Charlene Wade is a 78 y.o. female who was at dinner with a sitter when she was noted to have difficulty speaking. She was "garbling" her words, but states that she never had a period wher she could not understand. EMS was called. It is unclear as to exact time of onset as patient gives different accounts, but the most reliable time I feel is approximately 8 pm Monday evening. She had a transient episode of being unable to speak, then cleared, and subsequently had another few minutes of difficulty speaking. She was still having mild difficulties with her speech when seen in consultation by Dr. Leonel Ramsay. LSN: 8 pm Monday evening. tpa given: no, rapidly improving symptoms  NIHSS: 1   SUBJECTIVE The patient was seen on her way back from the vascular lab today. She was seated in a wheelchair. She was having minimal if any difficulties with her speech. She reported that she had experienced a CVA approximately one year ago and was seen at [facility] at that time.  OBJECTIVE Most recent Vital Signs: Filed Vitals:   10/13/12 0358 10/13/12 0553 10/13/12 0830 10/13/12 1300  BP: 197/53 202/67 200/65 200/69  Pulse: 70 76 73 71  Temp:  98.3 F (36.8 C) 97.6 F (36.4 C) 98.2 F (36.8 C)  TempSrc:  Oral Oral Oral  Resp:  18 20 18   Height:      Weight:      SpO2:  95% 96% 100%   CBG (last 3)   Basename 10/13/12 0230  GLUCAP 114*    IV Fluid Intake:     MEDICATIONS    . clopidogrel  75 mg Oral Q breakfast  . fenofibrate  160 mg Oral Daily  . omega-3 acid ethyl esters  1 g Oral Daily  . potassium chloride  20 mEq Oral BID  . simvastatin  40 mg Oral q1800  . sodium chloride  3 mL Intravenous Q12H  . vitamin B-12  1,000 mcg Oral Daily  . white petrolatum       PRN:  acetaminophen  Diet:  Cardiac with thin liquids. Activity: Up with assistance DVT Prophylaxis:  SCDs  CLINICALLY SIGNIFICANT STUDIES Basic Metabolic Panel:  Lab 99991111  0610 10/12/12 2238  NA 146* 141  K 3.0* 3.0*  CL 106 102  CO2 28 27  GLUCOSE 100* 138*  BUN 17 18  CREATININE 0.91 0.99  CALCIUM 10.3 10.6*  MG -- --  PHOS -- --   Liver Function Tests:  Lab 10/12/12 2238  AST 22  ALT 6  ALKPHOS 36*  BILITOT 0.4  PROT 6.5  ALBUMIN 3.5   CBC:  Lab 10/13/12 0610 10/12/12 2238  WBC 7.2 7.5  NEUTROABS -- 3.6  HGB 11.1* 11.3*  HCT 33.9* 34.2*  MCV 90.9 90.0  PLT 265 286   Coagulation:  Lab 10/12/12 2238  LABPROT 14.2  INR 1.11   Cardiac Enzymes:  Lab 10/12/12 2238  CKTOTAL --  CKMB --  CKMBINDEX --  TROPONINI <0.30   Urinalysis: No results found for this basename: COLORURINE:2,APPERANCEUR:2,LABSPEC:2,PHURINE:2,GLUCOSEU:2,HGBUR:2,BILIRUBINUR:2,KETONESUR:2,PROTEINUR:2,UROBILINOGEN:2,NITRITE:2,LEUKOCYTESUR:2 in the last 168 hours Lipid Panel No results found for this basename: chol, trig, hdl, cholhdl, vldl, ldlcalc   HgbA1C  Lab Results  Component Value Date   HGBA1C 5.7* 10/13/2012    Urine Drug Screen:     Component Value Date/Time   LABOPIA NONE DETECTED 10/13/2012 1000   COCAINSCRNUR NONE DETECTED 10/13/2012 1000   LABBENZ NONE DETECTED 10/13/2012 1000  AMPHETMU NONE DETECTED 10/13/2012 1000   THCU NONE DETECTED 10/13/2012 1000   LABBARB NONE DETECTED 10/13/2012 1000    Alcohol Level: No results found for this basename: ETH:2 in the last 168 hours  Ct Head Wo Contrast 10/12/2012  IMPRESSION: No CT evidence for acute infarction.  White matter changes and question prior right parietal lobe insult.  MRI has increased sensitivity for acute ischemia if clinical concern persists.  Dural calcification versus meningioma along the left convexity.       MRI of the brain  - Pending  MRA of the brain  - Pending  2D Echocardiogram  - EF 60 - 123456 LV - diastolic dysfunction. Moderately dilated left  atrium  Carotid Doppler  Bilateral: No evidence of hemodynamically significant internal carotid artery stenosis. Vertebral  artery flow is antegrade.   EKG  - sinus rhythm rate 67 beats per minute  Therapy Recommendations Pending  Physical Exam   Pleasant elderly Caucasian lady currently not in distress.Awake alert. Afebrile. Head is nontraumatic. Neck is supple without bruit. Hearing is normal. Cardiac exam no murmur or gallop. Lungs are clear to auscultation. Distal pulses are well felt.  Neurological Exam : Awake  Alert oriented x 3. Normal speech and language with only occasional word finding difficulties and paraphasic as. Good naming, repetition and comprehension.Marland [place] movements full without nystagmus.fundi could not be visualized. Vision acuity and fields appear normal. Face symmetric. Tongue midline. Normal strength, tone, reflexes and coordination. Normal sensation. Gait deferred.  ASSESSMENT [name] is a 78 y.o. female presenting with garbled speech likely do to left hemispheric TIA versus small stroke-etiology likely embolic. Marland Kitchen TPA was not felt to be indicated secondary to rapidly improving symptoms. Work up underway. The patient was not on antibiotic platelet therapy prior to admission. Now on clopidogrel 75 mg orally every day for secondary stroke prevention. Patient with resultant resolution of symptoms..  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  Dural calcification versus meningioma along the left convexity  Diabetes mellitus  Hypertension  Hospital day # 1  TREATMENT/PLAN  Continue clopidogrel 75 mg orally every day for secondary stroke prevention.  Await MRI/MRA  Await lipid panel  TEE  Risk factor modification.  Mikey Bussing PA-C [facility] Pager [phone] 10/13/2012, 3:52 PM  I have personally obtained a history, examined the patient, evaluated imaging results, and formulated the assessment and plan of care. I agree with the above.  Antony Contras, MD Medical Director [facility] Stroke  Center Pager: [phone] 10/13/2012 6:54 PM

## 2012-10-14 ENCOUNTER — Inpatient Hospital Stay (HOSPITAL_COMMUNITY): Payer: Medicare Other

## 2012-10-14 DIAGNOSIS — R4701 Aphasia: Secondary | ICD-10-CM

## 2012-10-14 LAB — BASIC METABOLIC PANEL
CO2: 25 mEq/L (ref 19–32)
Calcium: 10 mg/dL (ref 8.4–10.5)
Creatinine, Ser: 0.85 mg/dL (ref 0.50–1.10)
GFR calc Af Amer: 72 mL/min — ABNORMAL LOW (ref >90)
GFR calc non Af Amer: 62 mL/min — ABNORMAL LOW (ref >90)
Sodium: 142 mEq/L (ref 135–145)

## 2012-10-14 LAB — LIPID PANEL
LDL Cholesterol: 55 mg/dL (ref 0–99)
Total CHOL/HDL Ratio: 2.2 RATIO

## 2012-10-14 LAB — PARATHYROID HORMONE, INTACT (NO CA): PTH: 8.3 pg/mL — ABNORMAL LOW (ref 14.0–72.0)

## 2012-10-14 LAB — CALCIUM, IONIZED: Calcium, Ion: 1.34 mmol/L — ABNORMAL HIGH (ref 1.12–1.32)

## 2012-10-14 MED ORDER — POTASSIUM CHLORIDE CRYS ER 20 MEQ PO TBCR: 20.0000 meq | EXTENDED_RELEASE_TABLET | Freq: Two times a day (BID) | ORAL | Status: DC

## 2012-10-14 MED ORDER — AMLODIPINE BESYLATE 5 MG PO TABS
5.0000 mg | ORAL_TABLET | Freq: Every day | ORAL | Status: DC
  Filled 2012-10-14: qty 1

## 2012-10-14 MED ORDER — METOPROLOL SUCCINATE ER 50 MG PO TB24
50.0000 mg | ORAL_TABLET | Freq: Every day | ORAL | Status: DC
  Administered 2012-10-14: 50 mg via ORAL
  Filled 2012-10-14 (×2): qty 1

## 2012-10-14 MED ORDER — CLOPIDOGREL BISULFATE 75 MG PO TABS: 75.0000 mg | ORAL_TABLET | Freq: Every day | ORAL | Status: DC

## 2012-10-14 MED ORDER — GADOBENATE DIMEGLUMINE 529 MG/ML IV SOLN
15.0000 mL | Freq: Once | INTRAVENOUS | Status: AC
  Administered 2012-10-14: 15 mL via INTRAVENOUS

## 2012-10-14 MED ORDER — AMLODIPINE BESYLATE 5 MG PO TABS: 5.0000 mg | ORAL_TABLET | Freq: Every day | ORAL | Status: DC

## 2012-10-14 NOTE — Progress Notes (Signed)
Patient ID: Charlene Wade  female  [id number]    DOB: [birth year]    DOA: 10/12/2012  PCP: Provider Not In System  Assessment/Plan: Principal Problem:  *Difficulty speaking Active Problems:  Diabetes mellitus without complication  H/O: CVA (cerebrovascular accident)  HTN (hypertension)  Hypercalcemia  Hyperlipidemia  Dysarthria: Undergoing stroke workup, was not on antiplatelet therapy prior to admission  - Neurology following, MRI/MRA brain still pending -2-D echo shows EF of 123456, grade 2 diastolic dysfunction, TEE ordered by neurology - Carotid Doppler shows no evidence of significant ICA stenosis - Hemoglobin A1c 5.7, pending lipid panel - Continue Plavix, discontinued aspirin   Diabetes mellitus: Appears to be diet controlled, hemoglobin A1c 5.7   Hypertension  -Restarted metoprolol succinate 50 mg daily  -Discontinue HCTZ due to hypercalcemia   Hypercalcemia  - Likely due to hydrochlorothiazide although patient was on calcium supplementation as well as Prolia  - Recheck BMET  Hyperlipidemia  -Patient on Pravachol 80 mg at home, on simvastatin 40 mg in the hospital  -Restarted home dose of TriCor and omega-3 fatty acid   Deconditioning  -PT/OT eval  DVT Prophylaxis: SCDs  Code Status: Full code  Disposition: Hopefully tomorrow    Subjective: Denies any specific complaints, sitting in the chair, no acute issues overnight. No nausea, vomiting, abdominal pain, chest pain or fevers  Objective: Weight change:  No intake or output data in the 24 hours ending 10/14/12 0924 Blood pressure 172/58, pulse 78, temperature 97.7 F (36.5 C), temperature source Oral, resp. rate 19, height 4\' 8"  (1.422 m), weight 71 kg (156 lb 8.4 oz), SpO2 98.00%.  Physical Exam: General: Alert and awake, oriented x3, not in any acute distress. HEENT: anicteric sclera, pupils reactive to light and accommodation, EOMI CVS: S1-S2 clear, no murmur rubs or gallops Chest: clear to  auscultation bilaterally, no wheezing, rales or rhonchi Abdomen: soft nontender, nondistended, normal bowel sounds, no organomegaly Extremities: no cyanosis, clubbing or edema noted bilaterally Neuro: Strength 5/5 in bilateral upper extremities, 5/5 in LE   Lab Results: Basic Metabolic Panel:  Lab 99991111 0610 10/12/12 2238  NA 146* 141  K 3.0* 3.0*  CL 106 102  CO2 28 27  GLUCOSE 100* 138*  BUN 17 18  CREATININE 0.91 0.99  CALCIUM 10.3 10.6*  MG -- --  PHOS -- --   Liver Function Tests:  Lab 10/12/12 2238  AST 22  ALT 6  ALKPHOS 36*  BILITOT 0.4  PROT 6.5  ALBUMIN 3.5   No results found for this basename: LIPASE:2,AMYLASE:2 in the last 168 hours No results found for this basename: AMMONIA:2 in the last 168 hours CBC:  Lab 10/13/12 0610 10/12/12 2238  WBC 7.2 7.5  NEUTROABS -- 3.6  HGB 11.1* 11.3*  HCT 33.9* 34.2*  MCV 90.9 90.0  PLT 265 286   Cardiac Enzymes:  Lab 10/12/12 2238  CKTOTAL --  CKMB --  CKMBINDEX --  TROPONINI <0.30   BNP: No components found with this basename: POCBNP:2 CBG:  Lab 10/13/12 0230  GLUCAP 114*     Micro Results: No results found for this or any previous visit (from the past 240 hour(s)).  Studies/Results: Ct Head Wo Contrast  10/12/2012  *RADIOLOGY REPORT*  Clinical Data: Code stroke, altered mental status.  CT HEAD WITHOUT CONTRAST  Technique:  Contiguous axial images were obtained from the base of the skull through the vertex without contrast.  Comparison: None.  Findings: Prominence of the sulci, cisterns, and ventricles, in keeping with  volume loss. There are subcortical and periventricular white matter hypodensities, a nonspecific finding most often seen with chronic microangiopathic changes. More confluent hypoattenuation within the right centrum semiovale parietal lobe may reflect sequelae of prior ischemia Calcification along the left frontal lateral convexity measures 2 cm in diameter and may reflect dural  calcification or meningioma.  There is no evidence for acute hemorrhage, overt hydrocephalus, or abnormal extra-axial fluid collection.  No definite CT evidence for acute cortical based (large artery) infarction.  IMPRESSION: No CT evidence for acute infarction.  White matter changes and question prior right parietal lobe insult.  MRI has increased sensitivity for acute ischemia if clinical concern persists.  Dural calcification versus meningioma along the left convexity.   Original Report Authenticated By: Carlos Levering, M.D.     Medications: Scheduled Meds:   . clopidogrel  75 mg Oral Q breakfast  . fenofibrate  160 mg Oral Daily  . metoprolol succinate  50 mg Oral Daily  . omega-3 acid ethyl esters  1 g Oral Daily  . potassium chloride  20 mEq Oral BID  . simvastatin  40 mg Oral q1800  . sodium chloride  3 mL Intravenous Q12H  . vitamin B-12  1,000 mcg Oral Daily      LOS: 2 days   [name] M.D. [facility] 10/14/2012, 9:24 AM Pager: [phone]  If 7PM-7AM, please contact night-coverage [email or web address] Password [email or web address]

## 2012-10-14 NOTE — Evaluation (Signed)
Occupational Therapy Evaluation Patient Details Name: EARLDEAN LOBECK MRN: [id number] DOB: 1928/02/20 Today's Date: 10/14/2012 Time: [id number] OT Time Calculation (min): 33 min  OT Assessment / Plan / Recommendation Clinical Impression  Pt admitted with suspected TIA vs seizures.  Workup complete with pt planning to d/c home today.  No further OT needs.  Pt is at or very near her baseline.  All safety equipment is in place at home.    OT Assessment  Patient does not need any further OT services    Follow Up Recommendations  No OT follow up    Barriers to Discharge      Equipment Recommendations  None recommended by OT    Recommendations for Other Services    Frequency       Precautions / Restrictions Precautions Precautions: Fall   Pertinent Vitals/Pain Knees upon standing due to OA    ADL  Eating/Feeding: Independent Where Assessed - Eating/Feeding: Chair Grooming: Supervision/safety;Wash/dry hands;Brushing hair;Teeth care Where Assessed - Grooming: Unsupported standing Upper Body Bathing: Supervision/safety Where Assessed - Upper Body Bathing: Unsupported sitting Lower Body Bathing: Supervision/safety Where Assessed - Lower Body Bathing: Supported sit to stand Upper Body Dressing: Set up Where Assessed - Upper Body Dressing: Unsupported sitting Lower Body Dressing: Supervision/safety Where Assessed - Lower Body Dressing: Supported sit to Lobbyist: Copy Method: Sit to Loss adjuster, chartered: Comfort height toilet Toileting - Water quality scientist and Hygiene: Supervision/safety;Other (comment) (pt uses a bidet at home) Where Assessed - Toileting Clothing Manipulation and Hygiene: Sit to stand from 3-in-1 or toilet Equipment Used: Rolling walker Transfers/Ambulation Related to ADLs: supervision, some verbal cues for hand placement and use of brakes on unfamiliar rollator.    OT Diagnosis:    OT Problem List:    OT Treatment Interventions:     OT Goals    Visit Information  Last OT Received On: 10/14/12 Assistance Needed: +1 PT/OT Co-Evaluation/Treatment: Yes    Subjective Data  Subjective: "I have a caregiver that is with me when I get in the shower." Patient Stated Goal: Return home.   Prior Functioning     Home Living Lives With: Alone Available Help at Discharge: Family;Available PRN/intermittently;Personal care attendant Type of Home: House Home Access: Level entry Home Layout: One level Bathroom Shower/Tub: Multimedia programmer: Standard Bathroom Accessibility: Yes How Accessible: Accessible via walker Home Adaptive Equipment: Walker - four wheeled;Grab bars in shower;Shower chair with back Prior Function Level of Independence: Needs assistance Needs Assistance: Bathing Bath: Supervision/set-up Driving: No Vocation: Retired 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 Dominant Hand: Right         Vision/Perception     Cognition  Overall Cognitive Status: Appears within functional limits for tasks assessed/performed Arousal/Alertness: Awake/alert Orientation Level: Oriented X4 / Intact;Appears intact for tasks assessed Behavior During Session: [facility] for tasks performed    Extremity/Trunk Assessment Right Upper Extremity Assessment RUE ROM/Strength/Tone: [facility] for tasks assessed (pt with arthritic changes in hands) RUE Coordination: WFL - gross/fine motor Left Upper Extremity Assessment LUE ROM/Strength/Tone: WFL for tasks assessed LUE Coordination: WFL - gross/fine motor Trunk Assessment Trunk Assessment: Kyphotic     Mobility Bed Mobility Bed Mobility: Not assessed Transfers Transfers: Sit to Stand;Stand to Sit Sit to Stand: 5: Supervision Stand to Sit: 5: Supervision Details for Transfer Assistance: Patient required increased time but was able to perform using rw with no assist     Shoulder Instructions     Exercise     Balance  End of Session OT - End of Session Activity Tolerance: Patient tolerated treatment well Patient left: in chair;with call bell/phone within reach Nurse Communication: Other (comment) (eval completed, no OT needs for d/c)  GO     Malka So 10/14/2012, 3:02 PM [phone]

## 2012-10-14 NOTE — Discharge Summary (Signed)
Physician Discharge Summary  Patient ID: Charlene Wade MRN: [id number] DOB/AGE: 1928/05/08 78 y.o.  Admit date: 10/12/2012 Discharge date: 10/14/2012  Primary Care Physician:  Provider Not In System  Discharge Diagnoses:    . Difficulty speaking dysarthria likely secondary to left hemispheric TIA versus small embolic stroke / . Diabetes mellitus without complication . HTN (hypertension) Hypercalcemia Hypokalemia Incidentally found meningioma  Consults:  Neurology, Dr. Therese Sarah cardiology for TEE   Discharge Medications:   Medication List     As of 10/14/2012  2:44 PM    STOP taking these medications         hydrochlorothiazide 25 MG tablet   Commonly known as: HYDRODIURIL      TAKE these medications         albuterol 108 (90 BASE) MCG/ACT inhaler   Commonly known as: PROVENTIL HFA;VENTOLIN HFA   Inhale 1-2 puffs into the lungs every 6 (six) hours as needed. For shortness of breath and wheezing      amLODipine 5 MG tablet   Commonly known as: NORVASC   Take 1 tablet (5 mg total) by mouth daily.      buPROPion 75 MG tablet   Commonly known as: WELLBUTRIN   Take 75 mg by mouth daily.      CALTRATE 600+D 600-400 MG-UNIT per tablet   Generic drug: Calcium Carbonate-Vitamin D   Take 2 tablets by mouth daily.      Cinnamon 500 MG capsule   Take 1,000 mg by mouth daily.      clopidogrel 75 MG tablet   Commonly known as: PLAVIX   Take 1 tablet (75 mg total) by mouth daily with breakfast.      Co Q 10 100 MG Caps   Take 1 tablet by mouth daily.      denosumab 60 MG/ML Soln injection   Commonly known as: PROLIA   Inject 60 mg into the skin every 6 (six) months. Administer in upper arm, thigh, or abdomen      fenofibrate 145 MG tablet   Commonly known as: TRICOR   Take 145 mg by mouth daily.      fish oil-omega-3 fatty acids 1000 MG capsule   Take 1 g by mouth daily.      fluticasone 50 MCG/ACT nasal spray   Commonly known as:  FLONASE   Place 1 spray into the nose 2 (two) times daily.      metoprolol succinate 50 MG 24 hr tablet   Commonly known as: TOPROL-XL   Take 50 mg by mouth daily. Take with or immediately following a meal.      multivitamin with minerals Tabs   Take 1 tablet by mouth daily.      omeprazole 20 MG capsule   Commonly known as: PRILOSEC   Take 20 mg by mouth daily.      OSTEO BI-FLEX REGULAR STRENGTH 250-200 MG Tabs   Generic drug: Glucosamine-Chondroitin   Take 1 tablet by mouth daily.      potassium chloride SA 20 MEQ tablet   Commonly known as: K-DUR,KLOR-CON   Take 1 tablet (20 mEq total) by mouth 2 (two) times daily. X 3 days      pravastatin 80 MG tablet   Commonly known as: PRAVACHOL   Take 80 mg by mouth at bedtime.      vitamin B-12 1000 MCG tablet  Commonly known as: CYANOCOBALAMIN   Take 1,000 mcg by mouth daily.         Brief H and P: For complete details please refer to admission H and P, but in brief patient is 78 year old female with history of CVA in the last year with residual effects of speech difficulty presented after a friend noted that she was having worse than normal dysarthria. Patient lives at home. At the time of ER admission patient's speech was back to normal. Patient was admitted for TIA workup.  Hospital Course:  Dysarthria: Patient underwent stroke workup and per neurology likely had a left hemispheric TIA versus small embolic stroke etiology. Patient was not on antiplatelet therapy prior to admission, she was placed on Plavix 75 mg daily with resolution of her symptoms. 2-D echo shows EF of 123456, grade 2 diastolic dysfunction. TEE was recommended by neurology however could not be done inpatient. She is scheduled for TEE outpatient in the endoscopy unit on 10/22/2012 at 10 AM by Dr Johnsie Cancel.  Carotid Doppler shows no evidence of significant ICA stenosis. Per PT OT recommendations, home health was arranged  Diabetes mellitus: Appears to be diet  controlled, hemoglobin A1c 5.7   Hypertension: Restarted metoprolol succinate 50 mg daily and added Norvasc. Discontinued HCTZ due to hypercalcemia and hypokalemia  Hypercalcemia  - Likely due to hydrochlorothiazide although patient was on calcium supplementation as well as Prolia   Hyperlipidemia: Continue statin, TriCor and omega-3 fatty acid   Day of Discharge BP 172/58  Pulse 78  Temp 97.7 F (36.5 C) (Oral)  Resp 19  Ht 4\' 8"  (1.422 m)  Wt 71 kg (156 lb 8.4 oz)  BMI 35.09 kg/m2  SpO2 98%  Physical Exam: General: Alert and awake oriented x3 not in any acute distress. HEENT: anicteric sclera, pupils reactive to light and accommodation CVS: S1-S2 clear no murmur rubs or gallops Chest: clear to auscultation bilaterally, no wheezing rales or rhonchi Abdomen: soft nontender, nondistended, normal bowel sounds, no organomegaly Extremities: no cyanosis, clubbing or edema noted bilaterally Neuro: Strength 5/5 in bilateral upper extremities, 5/5 in LE    The results of significant diagnostics from this hospitalization (including imaging, microbiology, ancillary and laboratory) are listed below for reference.    LAB RESULTS: Basic Metabolic Panel:  Lab [id number] 10/13/12 0610  NA 142 146*  K 3.4* 3.0*  CL 106 106  CO2 25 28  GLUCOSE 98 100*  BUN 13 17  CREATININE 0.85 0.91  CALCIUM 10.0 10.3  MG -- --  PHOS -- --   Liver Function Tests:  Lab 10/12/12 2238  AST 22  ALT 6  ALKPHOS 36*  BILITOT 0.4  PROT 6.5  ALBUMIN 3.5   CBC:  Lab 10/13/12 0610 10/12/12 2238  WBC 7.2 7.5  NEUTROABS -- 3.6  HGB 11.1* 11.3*  HCT 33.9* 34.2*  MCV 90.9 --  PLT 265 286   Cardiac Enzymes:  Lab 10/12/12 2238  CKTOTAL --  CKMB --  CKMBINDEX --  TROPONINI <0.30   BNP: No components found with this basename: POCBNP:2 CBG:  Lab 10/13/12 0230  GLUCAP 114*    Significant Diagnostic Studies:  Ct Head Wo Contrast  10/12/2012  *RADIOLOGY REPORT*  Clinical Data: Code  stroke, altered mental status.  CT HEAD WITHOUT CONTRAST  Technique:  Contiguous axial images were obtained from the base of the skull through the vertex without contrast.  Comparison: None.  Findings: Prominence of the sulci, cisterns, and ventricles, in keeping with volume loss.  There are subcortical and periventricular white matter hypodensities, a nonspecific finding most often seen with chronic microangiopathic changes. More confluent hypoattenuation within the right centrum semiovale parietal lobe may reflect sequelae of prior ischemia Calcification along the left frontal lateral convexity measures 2 cm in diameter and may reflect dural calcification or meningioma.  There is no evidence for acute hemorrhage, overt hydrocephalus, or abnormal extra-axial fluid collection.  No definite CT evidence for acute cortical based (large artery) infarction.  IMPRESSION: No CT evidence for acute infarction.  White matter changes and question prior right parietal lobe insult.  MRI has increased sensitivity for acute ischemia if clinical concern persists.  Dural calcification versus meningioma along the left convexity.   Original Report Authenticated By: Carlos Levering, M.D.      Disposition and Follow-up:     Discharge Orders    Future Orders Please Complete By Expires   Diet - low sodium heart healthy      Increase activity slowly      Discharge instructions      Comments:   Please stop hydrochlorothiazide. Follow up with your MD and also check labs BMET (for potassium in 5 days)       DISPOSITION:Home  DIET:Heart healthy  ACTIVITY: as tolerated   DISCHARGE FOLLOW-UP Follow-up Information    Follow up with Forbes Cellar, MD. Schedule an appointment as soon as possible for a visit in 3 weeks. (for hospital follow-up)    Contact information:   [address], [address] [place] [address] [phone]       Follow up with [name], MD. On 10/22/2012.  (PLEASE COME TO [facility] AT 10:00 AM FOR TEE. PLEASE REMAIN NPO AFTER MIDNIGHT FOR YOUR TEST ON 10/22/12. )    Contact information:   [address], [place] [address] [phone]          Time spent on Discharge: 45 mins  Signed:   Sanna Porcaro M.D. [facility] 10/14/2012, 2:44 PM Pager: [phone]

## 2012-10-14 NOTE — Progress Notes (Addendum)
Stroke Team Progress Note  HISTORY Charlene Wade is a 78 y.o. female who was at dinner with a sitter when she was noted to have difficulty speaking. She was "garbling" her words, but states that she never had a period wher she could not understand. EMS was called. It is unclear as to exact time of onset as patient gives different accounts, but the most reliable time I feel is approximately 8 pm Monday evening. She had a transient episode of being unable to speak, then cleared, and subsequently had another few minutes of difficulty speaking. She was still having mild difficulties with her speech when seen in consultation by Dr. Leonel Ramsay. LSN: 8 pm Monday evening. tpa given: no, rapidly improving symptoms  NIHSS: 1   SUBJECTIVE She yet has mild difficulty with speech but is a lot better.. She reported that she had experienced a CVA approximately one year ago and was seen at [facility] at that time. MRI brain shows no acute infarct but calcified left frontal meningioma.MRA brain shows no stenosis. OBJECTIVE Most recent Vital Signs: Filed Vitals:   10/13/12 1844 10/13/12 2250 10/14/12 0200 10/14/12 0700  BP: 174/41 194/54 190/52 172/58  Pulse: 71 74 80 78  Temp: 98.2 F (36.8 C) 97.8 F (36.6 C) 97.8 F (36.6 C) 97.7 F (36.5 C)  TempSrc: Oral Oral Oral Oral  Resp: 20 20 18 19   Height:      Weight:      SpO2: 96% 98% 96% 98%   CBG (last 3)   Basename 10/13/12 0230  GLUCAP 114*    IV Fluid Intake:     MEDICATIONS     . clopidogrel  75 mg Oral Q breakfast  . fenofibrate  160 mg Oral Daily  . metoprolol succinate  50 mg Oral Daily  . omega-3 acid ethyl esters  1 g Oral Daily  . potassium chloride  20 mEq Oral BID  . simvastatin  40 mg Oral q1800  . sodium chloride  3 mL Intravenous Q12H  . vitamin B-12  1,000 mcg Oral Daily   PRN:  acetaminophen  Diet:  Cardiac with thin liquids. Activity: Up with assistance DVT Prophylaxis:  SCDs  CLINICALLY SIGNIFICANT  STUDIES Basic Metabolic Panel:   Lab [id number] 10/13/12 0610  NA 142 146*  K 3.4* 3.0*  CL 106 106  CO2 25 28  GLUCOSE 98 100*  BUN 13 17  CREATININE 0.85 0.91  CALCIUM 10.0 10.3  MG -- --  PHOS -- --   Liver Function Tests:   Lab 10/12/12 2238  AST 22  ALT 6  ALKPHOS 36*  BILITOT 0.4  PROT 6.5  ALBUMIN 3.5   CBC:   Lab 10/13/12 0610 10/12/12 2238  WBC 7.2 7.5  NEUTROABS -- 3.6  HGB 11.1* 11.3*  HCT 33.9* 34.2*  MCV 90.9 90.0  PLT 265 286   Coagulation:   Lab 10/12/12 2238  LABPROT 14.2  INR 1.11   Cardiac Enzymes:   Lab 10/12/12 2238  CKTOTAL --  CKMB --  CKMBINDEX --  TROPONINI <0.30   Urinalysis: No results found for this basename: COLORURINE:2,APPERANCEUR:2,LABSPEC:2,PHURINE:2,GLUCOSEU:2,HGBUR:2,BILIRUBINUR:2,KETONESUR:2,PROTEINUR:2,UROBILINOGEN:2,NITRITE:2,LEUKOCYTESUR:2 in the last 168 hours Lipid Panel    Component Value Date/Time   CHOL 156 10/14/2012 0850   HgbA1C  Lab Results  Component Value Date   HGBA1C 5.7* 10/13/2012    Urine Drug Screen:     Component Value Date/Time   LABOPIA NONE DETECTED 10/13/2012 1000   COCAINSCRNUR NONE DETECTED 10/13/2012 1000  LABBENZ NONE DETECTED 10/13/2012 1000   AMPHETMU NONE DETECTED 10/13/2012 1000   THCU NONE DETECTED 10/13/2012 1000   LABBARB NONE DETECTED 10/13/2012 1000    Alcohol Level: No results found for this basename: ETH:2 in the last 168 hours  Ct Head Wo Contrast 10/12/2012  IMPRESSION: No CT evidence for acute infarction.  White matter changes and question prior right parietal lobe insult.  MRI has increased sensitivity for acute ischemia if clinical concern persists.  Dural calcification versus meningioma along the left convexity.       MRI of the brain  -no acute infarct. calcified left frontal meningioma  MRA of the brain  - no stenosis  2D Echocardiogram  - EF 60 - [id number] LV - diastolic dysfunction. Moderately dilated left  atrium  Carotid Doppler  Bilateral: No  evidence of hemodynamically significant internal carotid artery stenosis. Vertebral artery flow is antegrade.   EKG  - sinus rhythm rate 67 beats per minute Lipids normal Therapy Recommendations Pending  Physical Exam   Pleasant elderly Caucasian lady currently not in distress.Awake alert. Afebrile. Head is nontraumatic. Neck is supple without bruit. Hearing is normal. Cardiac exam no murmur or gallop. Lungs are clear to auscultation. Distal pulses are well felt.  Neurological Exam : Awake  Alert oriented x 3. Normal speech and language with only occasional word finding difficulties and no paraphasias. Good naming, repetition and comprehension.Marland [place] movements full without nystagmus.fundi could not be visualized. Vision acuity and fields appear normal. Face symmetric. Tongue midline. Normal strength, tone, reflexes and coordination. Normal sensation. Gait deferred.  ASSESSMENT Charlene Wade is a 78 y.o. female presenting with garbled speech likely do to left hemispheric TIA versus small stroke-etiology likely embolic. Marland Kitchen TPA was not felt to be indicated secondary to rapidly improving symptoms. Work up underway. The patient was not on antibiotic platelet therapy prior to admission. Now on clopidogrel 75 mg orally every day for secondary stroke prevention. Patient with resultant resolution of symptoms..  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  Dural calcification versus meningioma along the left convexity  Diabetes mellitus  Hypertension  Left frontal convexity meningioma likely incidental  Hospital day # 2  TREATMENT/PLAN  Continue clopidogrel 75 mg orally every day for secondary stroke prevention.  TEE can be done as outpatient as unable to schedule till Thursday  Risk factor modification.  D/W patient and Dr Tana Coast  Conservative f/u for meningioma     Antony Contras, MD Medical Director [place] Pager:  [phone] 10/14/2012 12:02 PM

## 2012-10-14 NOTE — Care Management Note (Signed)
    Page 1 of 1   10/14/2012     3:33:53 PM   CARE MANAGEMENT NOTE 10/14/2012  Patient:  DOMINGUE, DELORGE   Account Number:  [id number]  Date Initiated:  10/13/2012  Documentation initiated by:  [facility]  Subjective/Objective Assessment:   Admitted with aphasia, CVA workup.     Action/Plan:   PT eval   Anticipated DC Date:  10/15/2012   Anticipated DC Plan:  [place]  CM consult      Choice offered to / List presented to:          [facility] arranged  [place].   Status of service:  Completed, signed off Medicare Important Message given?   (If response is "NO", the following Medicare IM given date fields will be blank) Date Medicare IM given:   Date Additional Medicare IM given:    Discharge Disposition:  [place]  Per UR Regulation:  Reviewed for med. necessity/level of care/duration of stay  If discussed at [place] of Stay Meetings, dates discussed:    Comments:  10-14-12 3:30pm Luz Lex, RNBSN [id number] Lives at home alone - sister and brother in law live fairly close and are planning on picking her up and bringing her home.  Plan for [facility] PT/OT to follow at home.  Has used [facility] in past and would like to continue with them.  Referral made to [facility] at [facility].

## 2012-10-14 NOTE — Progress Notes (Signed)
Physical Therapy Treatment Patient Details Name: Charlene Wade MRN: [id number] DOB: 04/28/1928 Today's Date: 10/14/2012 Time: [id number] PT Time Calculation (min): 34 min  PT Assessment / Plan / Recommendation Comments on Treatment Session  Pt moving well at this date.  Demonstrated safe use of rollator with functional mobility.  Pt is a level to safely d/c home with HHPT.      Follow Up Recommendations  Home health PT     Does the patient have the potential to tolerate intense rehabilitation     Barriers to Discharge        Equipment Recommendations       Recommendations for Other Services    Frequency Min 4X/week   Plan Discharge plan remains appropriate    Precautions / Restrictions Precautions Precautions: Fall       Mobility  Bed Mobility Bed Mobility: Not assessed Transfers Transfers: Sit to Stand;Stand to Sit Sit to Stand: 5: Supervision;With upper extremity assist;From chair/3-in-1;From toilet Stand to Sit: 5: Supervision;With upper extremity assist;With armrests;To chair/3-in-1;To toilet Details for Transfer Assistance: Cues for safest hand placement.  Pt fluctuates between starting with hands on rollator vs pushing up with armrests.   Ambulation/Gait Ambulation/Gait Assistance: 5: Supervision Ambulation Distance (Feet): 150 Feet Assistive device: Other (Comment) (rollator) Ambulation/Gait Assistance Details: Pt moves well, just slow.  Utilized rollator in todays session & pt demonstrates safe use of rollator with functional mobility.   Gait Pattern: Step-through pattern;Decreased stride length Gait velocity: decreased General Gait Details: patient is steady with ambulation despite significantly decreased cadence Stairs: No Wheelchair Mobility Wheelchair Mobility: No     PT Goals Acute Rehab PT Goals Time For Goal Achievement: 10/17/12 Potential to Achieve Goals: Good Pt will go Sit to Stand: with modified independence PT Goal: Sit to Stand -  Progress: Progressing toward goal Pt will go Stand to Sit: with modified independence PT Goal: Stand to Sit - Progress: Progressing toward goal Pt will Ambulate: >150 feet;with modified independence;with rolling walker PT Goal: Ambulate - Progress: Progressing toward goal  Visit Information  Last PT Received On: 10/14/12 Assistance Needed: +1    Subjective Data      Cognition  Overall Cognitive Status: Appears within functional limits for tasks assessed/performed Arousal/Alertness: Awake/alert Orientation Level: Appears intact for tasks assessed Behavior During Session: [facility] for tasks performed    Balance     End of Session PT - End of Session Activity Tolerance: Patient tolerated treatment well Patient left: in chair;with call bell/phone within reach Nurse Communication: Mobility status     Sarajane Marek, [place] [phone] 10/14/2012

## 2012-10-16 ENCOUNTER — Emergency Department (HOSPITAL_COMMUNITY): Payer: Medicare Other

## 2012-10-16 ENCOUNTER — Observation Stay (HOSPITAL_COMMUNITY)
Admission: EM | Admit: 2012-10-16 | Discharge: 2012-10-18 | Disposition: A | Discharge status: Home / Self Care | Payer: Medicare Other | Attending: Internal Medicine | Admitting: Internal Medicine

## 2012-10-16 ENCOUNTER — Encounter (HOSPITAL_COMMUNITY): Payer: Self-pay | Admitting: Emergency Medicine

## 2012-10-16 ENCOUNTER — Other Ambulatory Visit: Payer: Self-pay

## 2012-10-16 DIAGNOSIS — Z87891 Personal history of nicotine dependence: Secondary | ICD-10-CM | POA: Insufficient documentation

## 2012-10-16 DIAGNOSIS — E785 Hyperlipidemia, unspecified: Secondary | ICD-10-CM | POA: Diagnosis present

## 2012-10-16 DIAGNOSIS — Z8673 Personal history of transient ischemic attack (TIA), and cerebral infarction without residual deficits: Secondary | ICD-10-CM

## 2012-10-16 DIAGNOSIS — R4182 Altered mental status, unspecified: Secondary | ICD-10-CM

## 2012-10-16 DIAGNOSIS — I674 Hypertensive encephalopathy: Secondary | ICD-10-CM

## 2012-10-16 DIAGNOSIS — R479 Unspecified speech disturbances: Secondary | ICD-10-CM

## 2012-10-16 DIAGNOSIS — R079 Chest pain, unspecified: Secondary | ICD-10-CM | POA: Insufficient documentation

## 2012-10-16 DIAGNOSIS — Z7902 Long term (current) use of antithrombotics/antiplatelets: Secondary | ICD-10-CM | POA: Insufficient documentation

## 2012-10-16 DIAGNOSIS — I10 Essential (primary) hypertension: Secondary | ICD-10-CM | POA: Diagnosis present

## 2012-10-16 DIAGNOSIS — Z79899 Other long term (current) drug therapy: Secondary | ICD-10-CM | POA: Insufficient documentation

## 2012-10-16 DIAGNOSIS — R4701 Aphasia: Secondary | ICD-10-CM | POA: Insufficient documentation

## 2012-10-16 DIAGNOSIS — M6250 Muscle wasting and atrophy, not elsewhere classified, unspecified site: Secondary | ICD-10-CM | POA: Insufficient documentation

## 2012-10-16 DIAGNOSIS — E119 Type 2 diabetes mellitus without complications: Secondary | ICD-10-CM | POA: Insufficient documentation

## 2012-10-16 DIAGNOSIS — G459 Transient cerebral ischemic attack, unspecified: Principal | ICD-10-CM | POA: Insufficient documentation

## 2012-10-16 HISTORY — DX: Cerebral infarction, unspecified: I63.9

## 2012-10-16 HISTORY — DX: Hyperlipidemia, unspecified: E78.5

## 2012-10-16 HISTORY — DX: Essential (primary) hypertension: I10

## 2012-10-16 LAB — COMPREHENSIVE METABOLIC PANEL
ALT: 6 U/L (ref 0–35)
Alkaline Phosphatase: 36 U/L — ABNORMAL LOW (ref 39–117)
CO2: 24 mEq/L (ref 19–32)
GFR calc Af Amer: 46 mL/min — ABNORMAL LOW (ref >90)
Glucose, Bld: 110 mg/dL — ABNORMAL HIGH (ref 70–99)
Potassium: 4.4 mEq/L (ref 3.5–5.1)
Sodium: 139 mEq/L (ref 135–145)
Total Protein: 6.7 g/dL (ref 6.0–8.3)

## 2012-10-16 LAB — CBC WITH DIFFERENTIAL/PLATELET
Eosinophils Absolute: 0.2 10*3/uL (ref 0.0–0.7)
Lymphocytes Relative: 34 % (ref 12–46)
Lymphs Abs: 2.6 10*3/uL (ref 0.7–4.0)
Neutrophils Relative %: 52 % (ref 43–77)
Platelets: 259 10*3/uL (ref 150–400)
RBC: 4.03 MIL/uL (ref 3.87–5.11)
WBC: 7.6 10*3/uL (ref 4.0–10.5)

## 2012-10-16 LAB — URINALYSIS, ROUTINE W REFLEX MICROSCOPIC
Bilirubin Urine: NEGATIVE
Hgb urine dipstick: NEGATIVE
Ketones, ur: NEGATIVE mg/dL
Nitrite: NEGATIVE
pH: 6.5 (ref 5.0–8.0)

## 2012-10-16 NOTE — ED Notes (Signed)
Patient transported to X-ray 

## 2012-10-16 NOTE — ED Notes (Signed)
Patient transported to CT 

## 2012-10-16 NOTE — ED Provider Notes (Signed)
History     CSN: [id number]  Arrival date & time 10/16/12  2052   First MD Initiated Contact with Patient 10/16/12 2125      Chief Complaint  Patient presents with  . Altered Mental Status     HPI  The patient presents with concerns of expressive aphasia, disorientation.  These occur several times throughout today, but increasingly, and with longer duration towards the end of the day.  Notably, the patient was discharged from this facility 2 days ago after a presentation several days ago with similar symptoms.  During that admission she was diagnosed with stroke, started on Plavix.  She notes that in the day following discharge, prior to the onset of symptoms she was compliant with medication.  On my exam the patient states that she feels okay, and her family states that she is not currently demonstrating symptoms that were concerning earlier.  Symptoms occurred without clear precipitant, and there was no clear exacerbating or alleviating factors.  Past Medical History  Diagnosis Date  . Ectopic pregnancy   . Diabetes mellitus without complication   . Stroke     Past Surgical History  Procedure Date  . Abdominal hysterectomy   . Breast reduction surgery   . Laparoscopic salpingoopherectomy   . Appendectomy     No family history on file.  History  Substance Use Topics  . Smoking status: Former Research scientist (life sciences)  . Smokeless tobacco: Never Used  . Alcohol Use: No    OB History    Grav Para Term Preterm Abortions TAB SAB Ect Mult Living                  Review of Systems  Constitutional:       Per HPI, otherwise negative  HENT:       Per HPI, otherwise negative  Eyes: Negative.   Respiratory:       Per HPI, otherwise negative  Cardiovascular:       Per HPI, otherwise negative  Gastrointestinal: Negative for vomiting.  Genitourinary: Negative.   Musculoskeletal:       Per HPI, otherwise negative  Skin: Negative.   Neurological: Positive for speech difficulty. Negative  for syncope, facial asymmetry, weakness and light-headedness.    Allergies  Ace inhibitors; Penicillins; and Sulfa antibiotics  Home Medications   Current Outpatient Rx  Name  Route  Sig  Dispense  Refill  . ALBUTEROL SULFATE HFA 108 (90 BASE) MCG/ACT IN AERS   Inhalation   Inhale 1-2 puffs into the lungs every 6 (six) hours as needed. For shortness of breath and wheezing         . AMLODIPINE BESYLATE 5 MG PO TABS   Oral   Take 1 tablet (5 mg total) by mouth daily.   60 tablet   3   . BUPROPION HCL 75 MG PO TABS   Oral   Take 75 mg by mouth daily.         Marland Kitchen CALCIUM CARBONATE-VITAMIN D 600-400 MG-UNIT PO TABS   Oral   Take 2 tablets by mouth daily.         Marland Kitchen CINNAMON 500 MG PO CAPS   Oral   Take 1,000 mg by mouth daily.         Marland Kitchen CLOPIDOGREL BISULFATE 75 MG PO TABS   Oral   Take 1 tablet (75 mg total) by mouth daily with breakfast.   30 tablet   3   . CO Q 10 100 MG PO  CAPS   Oral   Take 1 tablet by mouth daily. Hold while in hospital         . DENOSUMAB 60 MG/ML [place] SOLN   Subcutaneous   Inject 60 mg into the skin every 6 (six) months. Administer in upper arm, thigh, or abdomen         . FENOFIBRATE 145 MG PO TABS   Oral   Take 145 mg by mouth daily.         . OMEGA-3 FATTY ACIDS 1000 MG PO CAPS   Oral   Take 1 g by mouth daily.         Marland Kitchen FLUTICASONE PROPIONATE 50 MCG/ACT NA SUSP   Nasal   Place 1 spray into the nose 2 (two) times daily.         Marland Kitchen GLUCOSAMINE-CHONDROITIN 250-200 MG PO TABS   Oral   Take 1 tablet by mouth daily. Hold while in hospital         . METOPROLOL SUCCINATE ER 50 MG PO TB24   Oral   Take 50 mg by mouth daily. Take with or immediately following a meal.         . ADULT MULTIVITAMIN W/MINERALS CH   Oral   Take 1 tablet by mouth daily.         Marland Kitchen OMEPRAZOLE 20 MG PO CPDR   Oral   Take 20 mg by mouth daily.         Marland Kitchen POTASSIUM CHLORIDE CRYS ER 20 MEQ PO TBCR   Oral   Take 20 mEq by mouth 2 (two)  times daily.         Marland Kitchen PRAVASTATIN SODIUM 80 MG PO TABS   Oral   Take 80 mg by mouth at bedtime.         Marland Kitchen VITAMIN B-12 1000 MCG PO TABS   Oral   Take 1,000 mcg by mouth daily.           BP 166/48  Pulse 70  Temp 98.1 F (36.7 C) (Oral)  Resp 22  SpO2 100%  Physical Exam  Nursing note and vitals reviewed. Constitutional: She is oriented to person, place, and time. She appears well-developed and well-nourished. No distress.  HENT:  Head: Normocephalic and atraumatic.  Eyes: Conjunctivae normal and EOM are normal.  Cardiovascular: Normal rate and regular rhythm.   Pulmonary/Chest: Effort normal and breath sounds normal. No stridor. No respiratory distress.  Abdominal: She exhibits no distension.  Musculoskeletal: She exhibits no edema.  Neurological: She is alert and oriented to person, place, and time. She displays no atrophy and no tremor. No cranial nerve deficit or sensory deficit. She exhibits normal muscle tone. She displays no seizure activity. Coordination normal.       No facial asymmetry, speech is clear, cogent  Skin: Skin is warm and dry.  Psychiatric: She has a normal mood and affect.    ED Course  Procedures (including critical care time)  Labs Reviewed  CBC WITH DIFFERENTIAL - Abnormal; Notable for the following:    Hemoglobin 11.9 (*)     All other components within normal limits  COMPREHENSIVE METABOLIC PANEL - Abnormal; Notable for the following:    Glucose, Bld 110 (*)     Creatinine, Ser 1.24 (*)     Alkaline Phosphatase 36 (*)     GFR calc non Af Amer 39 (*)     GFR calc Af Amer 46 (*)     All other components  within normal limits  URINALYSIS, ROUTINE W REFLEX MICROSCOPIC - Abnormal; Notable for the following:    Leukocytes, UA TRACE (*)     All other components within normal limits  URINE MICROSCOPIC-ADD ON - Abnormal; Notable for the following:    Squamous Epithelial / LPF FEW (*)     All other components within normal limits  TROPONIN  I   Dg Chest 2 View  10/16/2012  *RADIOLOGY REPORT*  Clinical Data: Stroke symptoms.  Bronchitis.  Chest discomfort.  CHEST - 2 VIEW  Comparison: 03/26/2009.  Findings: Bilateral basilar atelectasis.  No airspace disease.  No effusion.  Deformity of the right humerus is present proximally compatible with an old fracture.  Cardiomediastinal contours appear within normal limits and unchanged from prior.  IMPRESSION: Mild basilar atelectasis.   Original Report Authenticated By: Dereck Ligas, M.D.    Ct Head Wo Contrast  10/16/2012  *RADIOLOGY REPORT*  Clinical Data: Altered mental status.  CT HEAD WITHOUT CONTRAST  Technique:  Contiguous axial images were obtained from the base of the skull through the vertex without contrast.  Comparison: 10/14/2012 brain MRI.  Findings: Calcified meningioma along the left frontal lobe is unchanged compared to recent MRI.  Atrophy and chronic ischemic white matter disease is present.  There is no intracranial hemorrhage.  No midline shift.  Paranasal sinuses and mastoid air cells appear within normal limits. Low attenuation in the periventricular right parietal lobe may represent small infarct or severe chronic ischemic white matter change seen on prior MRI.  IMPRESSION: No acute abnormality.  Atrophy, chronic ischemic white matter disease and left frontal calcified meningioma are similar.   Original Report Authenticated By: Dereck Ligas, M.D.      1. Altered mental status   2. Expressive aphasia   3. Hypertension    Pulse ox 99% room air normal    Date: 10/16/2012  Rate: 75  Rhythm: normal sinus rhythm  QRS Axis: normal  Intervals: normal  ST/T Wave abnormalities: normal  Conduction Disutrbances:none  Narrative Interpretation:   Old EKG Reviewed: unchanged NORMAL   On arrival the patient's blood pressure is elevated, with a systolic greater than [id number].  Update: the patient's blood pressure is now 123XX123 systolic   I reviewed the patient's chart,  including a recent discharge summary, recent imaging results. MDM  This elderly female with recent diagnosis of stroke presents with new recurrence of typical symptoms.  Notably, on exam the patient is hypertensive, with a systolic blood pressure of greater than 200.  The patient is however, asymptomatic.  Given prescription of new intermittent symptoms, the hypertension, or suspicion of hypertensive urgency.  The patient's CT was unremarkable.  The patient's labs demonstrate mild elevation in creatinine.  Given the concern for hypertension, and recurrence of symptoms, the patient was admitted for further evaluation and management.        Carmin Muskrat, MD 10/16/12 [phone]

## 2012-10-16 NOTE — ED Notes (Addendum)
Patient with confusion per family, started one hour ago, confusion cleared, headache on left side of head.  Hypertensive enroute to ED.  Patient is starting to become confused again per EMS.  No nausea, no dizziness, no CP or shortness of breath.  Patient just released from [facility] for Stroke on Wednesday.

## 2012-10-16 NOTE — ED Notes (Signed)
MD at bedside. EDP Vanita Panda

## 2012-10-17 ENCOUNTER — Observation Stay (HOSPITAL_COMMUNITY): Payer: Medicare Other

## 2012-10-17 ENCOUNTER — Encounter (HOSPITAL_COMMUNITY): Payer: Self-pay | Admitting: Internal Medicine

## 2012-10-17 DIAGNOSIS — I674 Hypertensive encephalopathy: Secondary | ICD-10-CM

## 2012-10-17 DIAGNOSIS — R4182 Altered mental status, unspecified: Secondary | ICD-10-CM

## 2012-10-17 DIAGNOSIS — F32A Depression, unspecified: Secondary | ICD-10-CM

## 2012-10-17 HISTORY — DX: Depression, unspecified: F32.A

## 2012-10-17 LAB — CBC
MCV: 91.7 fL (ref 78.0–100.0)
Platelets: 263 10*3/uL (ref 150–400)
RDW: 14.4 % (ref 11.5–15.5)
WBC: 7.7 10*3/uL (ref 4.0–10.5)

## 2012-10-17 LAB — GLUCOSE, CAPILLARY: Glucose-Capillary: 89 mg/dL (ref 70–99)

## 2012-10-17 LAB — LIPID PANEL
HDL: 63 mg/dL (ref >39)
LDL Cholesterol: 49 mg/dL (ref 0–99)
Total CHOL/HDL Ratio: 2.2 RATIO

## 2012-10-17 MED ORDER — VITAMIN B-12 1000 MCG PO TABS
1000.0000 ug | ORAL_TABLET | Freq: Every day | ORAL | Status: DC
  Administered 2012-10-17 – 2012-10-18 (×2): 1000 ug via ORAL
  Filled 2012-10-17 (×2): qty 1

## 2012-10-17 MED ORDER — FENOFIBRATE 160 MG PO TABS
160.0000 mg | ORAL_TABLET | Freq: Every day | ORAL | Status: DC
  Administered 2012-10-17 – 2012-10-18 (×2): 160 mg via ORAL
  Filled 2012-10-17 (×2): qty 1

## 2012-10-17 MED ORDER — POTASSIUM CHLORIDE CRYS ER 20 MEQ PO TBCR
20.0000 meq | EXTENDED_RELEASE_TABLET | Freq: Two times a day (BID) | ORAL | Status: DC
  Administered 2012-10-17 – 2012-10-18 (×3): 20 meq via ORAL
  Filled 2012-10-17 (×4): qty 1

## 2012-10-17 MED ORDER — CLOPIDOGREL BISULFATE 75 MG PO TABS
75.0000 mg | ORAL_TABLET | Freq: Every day | ORAL | Status: DC
  Administered 2012-10-17 – 2012-10-18 (×2): 75 mg via ORAL
  Filled 2012-10-17 (×3): qty 1

## 2012-10-17 MED ORDER — OMEGA-3-ACID ETHYL ESTERS 1 G PO CAPS
1.0000 g | ORAL_CAPSULE | Freq: Every day | ORAL | Status: DC
  Administered 2012-10-17 – 2012-10-18 (×2): 1 g via ORAL
  Filled 2012-10-17 (×2): qty 1

## 2012-10-17 MED ORDER — ENOXAPARIN SODIUM 40 MG/0.4ML ~~LOC~~ SOLN
40.0000 mg | SUBCUTANEOUS | Status: DC
  Administered 2012-10-17 – 2012-10-18 (×2): 40 mg via SUBCUTANEOUS
  Filled 2012-10-17 (×2): qty 0.4

## 2012-10-17 MED ORDER — ACETAMINOPHEN 325 MG PO TABS
650.0000 mg | ORAL_TABLET | Freq: Four times a day (QID) | ORAL | Status: DC | PRN
  Administered 2012-10-17 – 2012-10-18 (×4): 650 mg via ORAL
  Filled 2012-10-17 (×4): qty 2

## 2012-10-17 MED ORDER — ATORVASTATIN CALCIUM 20 MG PO TABS
20.0000 mg | ORAL_TABLET | Freq: Every day | ORAL | Status: DC
  Administered 2012-10-17: 20 mg via ORAL
  Filled 2012-10-17 (×2): qty 1

## 2012-10-17 MED ORDER — CALCIUM CARBONATE-VITAMIN D 500-200 MG-UNIT PO TABS
2.0000 | ORAL_TABLET | Freq: Every day | ORAL | Status: DC
  Administered 2012-10-17 – 2012-10-18 (×2): 2 via ORAL
  Filled 2012-10-17 (×2): qty 2

## 2012-10-17 MED ORDER — BUPROPION HCL 75 MG PO TABS
75.0000 mg | ORAL_TABLET | Freq: Every day | ORAL | Status: DC
  Administered 2012-10-17 – 2012-10-18 (×2): 75 mg via ORAL
  Filled 2012-10-17 (×2): qty 1

## 2012-10-17 MED ORDER — PANTOPRAZOLE SODIUM 40 MG PO TBEC
40.0000 mg | DELAYED_RELEASE_TABLET | Freq: Every day | ORAL | Status: DC
  Administered 2012-10-17 – 2012-10-18 (×2): 40 mg via ORAL
  Filled 2012-10-17 (×2): qty 1

## 2012-10-17 MED ORDER — SIMVASTATIN 40 MG PO TABS
40.0000 mg | ORAL_TABLET | Freq: Every day | ORAL | Status: DC
  Filled 2012-10-17: qty 1

## 2012-10-17 MED ORDER — FLUTICASONE PROPIONATE 50 MCG/ACT NA SUSP
1.0000 | Freq: Two times a day (BID) | NASAL | Status: DC
  Administered 2012-10-17 – 2012-10-18 (×2): 1 via NASAL
  Filled 2012-10-17 (×2): qty 16

## 2012-10-17 MED ORDER — AMLODIPINE BESYLATE 5 MG PO TABS
5.0000 mg | ORAL_TABLET | Freq: Every day | ORAL | Status: DC
  Administered 2012-10-17 – 2012-10-18 (×2): 5 mg via ORAL
  Filled 2012-10-17 (×2): qty 1

## 2012-10-17 MED ORDER — ADULT MULTIVITAMIN W/MINERALS CH
1.0000 | ORAL_TABLET | Freq: Every day | ORAL | Status: DC
  Administered 2012-10-17 – 2012-10-18 (×2): 1 via ORAL
  Filled 2012-10-17 (×2): qty 1

## 2012-10-17 MED ORDER — ALBUTEROL SULFATE HFA 108 (90 BASE) MCG/ACT IN AERS
1.0000 | INHALATION_SPRAY | Freq: Four times a day (QID) | RESPIRATORY_TRACT | Status: DC | PRN
  Filled 2012-10-17: qty 6.7

## 2012-10-17 MED ORDER — OMEGA-3 FATTY ACIDS 1000 MG PO CAPS: 1.0000 g | ORAL_CAPSULE | Freq: Every day | ORAL | Status: DC

## 2012-10-17 MED ORDER — METOPROLOL SUCCINATE ER 50 MG PO TB24
50.0000 mg | ORAL_TABLET | Freq: Every day | ORAL | Status: DC
  Administered 2012-10-17 – 2012-10-18 (×2): 50 mg via ORAL
  Filled 2012-10-17 (×3): qty 1

## 2012-10-17 NOTE — H&P (Signed)
[facility] History and Physical  Charlene Wade [id number] DOB: 28-Mar-1928 DOA: 10/16/2012  Referring physician: EDP PCP: Provider Not In System Marella Chimes PA at [facility]) Specialists:   Chief Complaint: Difficulty speaking  HPI: Charlene Wade is a 78 y.o. female who presents to the ED with complaints of difficulty speaking that started at around 4 pm, her symptoms resolved while she was in the ED.   She also reports having a severe dull 10/10 headache.  She was inpatient days ago with similar symptoms and was diagnosed with a TIA or a small embolic CVA and was to follow-up as an outpatient for a TEE.     Review of Systems: The patient denies anorexia, fever, weight loss, vision loss, decreased hearing, hoarseness, chest pain, syncope, dyspnea on exertion, peripheral edema, balance deficits, hemoptysis, abdominal pain, melena, hematochezia, severe indigestion/heartburn, nausea, vomiting, constipation, diarrhea, hematuria, incontinence, dysuria, genital sores, muscle weakness, suspicious skin lesions, transient blindness, difficulty walking, depression, unusual weight change, abnormal bleeding, enlarged lymph nodes, angioedema, and breast masses.    Past Medical History  Diagnosis Date  . Ectopic pregnancy   . Diabetes mellitus without complication   . Stroke   . Hypertension   . Hyperlipidemia    Past Surgical History  Procedure Date  . Abdominal hysterectomy   . Breast reduction surgery   . Laparoscopic salpingoopherectomy   . Appendectomy   . Nasal mass excision     Basal Cell Cancer Excision  . Nasal reconstruction     Medications:  HOME MEDS: Prior to Admission medications   Medication Sig Start Date End Date Taking? Authorizing Provider  albuterol (PROVENTIL HFA;VENTOLIN HFA) 108 (90 BASE) MCG/ACT inhaler Inhale 1-2 puffs into the lungs every 6 (six) hours as needed. For shortness of breath and wheezing   Yes Historical Provider, MD  amLODipine  (NORVASC) 5 MG tablet Take 1 tablet (5 mg total) by mouth daily. 10/14/12  Yes Ripudeep Krystal Eaton, MD  buPROPion (WELLBUTRIN) 75 MG tablet Take 75 mg by mouth daily.   Yes Historical Provider, MD  Calcium Carbonate-Vitamin D (CALTRATE 600+D) 600-400 MG-UNIT per tablet Take 2 tablets by mouth daily.   Yes Historical Provider, MD  Cinnamon 500 MG capsule Take 1,000 mg by mouth daily.   Yes Historical Provider, MD  clopidogrel (PLAVIX) 75 MG tablet Take 1 tablet (75 mg total) by mouth daily with breakfast. 10/14/12  Yes Ripudeep K Rai, MD  Coenzyme Q10 (CO Q 10) 100 MG CAPS Take 1 tablet by mouth daily. Hold while in hospital   Yes Historical Provider, MD  denosumab (PROLIA) 60 MG/ML SOLN injection Inject 60 mg into the skin every 6 (six) months. Administer in upper arm, thigh, or abdomen   Yes Historical Provider, MD  fenofibrate (TRICOR) 145 MG tablet Take 145 mg by mouth daily.   Yes Historical Provider, MD  fish oil-omega-3 fatty acids 1000 MG capsule Take 1 g by mouth daily.   Yes Historical Provider, MD  fluticasone (FLONASE) 50 MCG/ACT nasal spray Place 1 spray into the nose 2 (two) times daily.   Yes Historical Provider, MD  Glucosamine-Chondroitin (OSTEO BI-FLEX REGULAR STRENGTH) 250-200 MG TABS Take 1 tablet by mouth daily. Hold while in hospital   Yes Historical Provider, MD  metoprolol succinate (TOPROL-XL) 50 MG 24 hr tablet Take 50 mg by mouth daily. Take with or immediately following a meal.   Yes Historical Provider, MD  Multiple Vitamin (MULITIVITAMIN WITH MINERALS) TABS Take 1 tablet by mouth daily.  Yes Historical Provider, MD  omeprazole (PRILOSEC) 20 MG capsule Take 20 mg by mouth daily.   Yes Historical Provider, MD  potassium chloride SA (K-DUR,KLOR-CON) 20 MEQ tablet Take 20 mEq by mouth 2 (two) times daily. 10/14/12  Yes Ripudeep Krystal Eaton, MD  pravastatin (PRAVACHOL) 80 MG tablet Take 80 mg by mouth at bedtime.   Yes Historical Provider, MD  vitamin B-12 (CYANOCOBALAMIN) 1000 MCG  tablet Take 1,000 mcg by mouth daily.   Yes Historical Provider, MD    Allergies:  Allergies  Allergen Reactions  . Ace Inhibitors     unknown  . Penicillins     unknown  . Sulfa Antibiotics     unknown    Social History:   reports that she has quit smoking. She has never used smokeless tobacco. She reports that she does not drink alcohol or use illicit drugs.  Family History: Family History  Problem Relation Age of Onset  . Hypertension Father   . Hypertension Mother   . Cancer - Lung Brother     Physical Exam:  GEN: Pleasant thin Elderly 78  Year old Caucasian Female examined  and in no acute distress; cooperative with exam Filed Vitals:  10/16/12 2102 10/16/12 2154 10/17/12 0034 BP: 166/48  169/47 Pulse: 70  69 Temp: 98 F (36.7 C) 98.1 F (36.7 C)  TempSrc: Oral   Resp: 22  18 SpO2: 100%  96%  Blood pressure 169/47, pulse 69, temperature 98.1 F (36.7 C), temperature source Oral, resp. rate 18, SpO2 96.00%. PSYCH: She is alert and oriented x4; does not appear anxious does not appear depressed; affect is normal HEENT: Normocephalic and Atraumatic, Mucous membranes pink; PERRLA; EOM intact; Fundi:  Benign;  No scleral icterus, Nares: Patent, Oropharynx: Clear, Fair Dentition, Neck:  FROM, no cervical lymphadenopathy nor thyromegaly or carotid bruit; no JVD; Breasts:: Not examined CHEST WALL: No tenderness CHEST: Normal respiration, clear to auscultation bilaterally HEART: Regular rate and rhythm; no murmurs rubs or gallops BACK: No kyphosis or scoliosis; no CVA tenderness ABDOMEN: Positive Bowel Sounds, soft non-tender; no masses, no organomegaly. Rectal Exam: Not done EXTREMITIES: No bone or joint deformity; age-appropriate arthropathy of the hands and knees; no cyanosis, clubbing or edema; no ulcerations. Genitalia: not examined PULSES: 2+ and symmetric SKIN: Normal hydration no rash or ulceration CNS: Cranial nerves 2-12 grossly intact no focal neurologic  deficit    Labs on Admission:  Basic Metabolic Panel:  Lab 0000000 2138 10/14/12 0850 10/13/12 0610 10/12/12 2238 10/12/12 2227  NA 139 142 146* 141 143  K 4.4 3.4* 3.0* 3.0* 3.0*  CL 105 106 106 102 104  CO2 24 25 28 27  --  GLUCOSE 110* 98 100* 138* 135*  BUN 18 13 17 18 19   CREATININE 1.24* 0.85 0.91 0.99 1.00  CALCIUM 9.7 10.0 10.3 10.6* --  MG -- -- -- -- --  PHOS -- -- -- -- --   Liver Function Tests:  Lab 10/16/12 2138 10/12/12 2238  AST 24 22  ALT 6 6  ALKPHOS 36* 36*  BILITOT 0.3 0.4  PROT 6.7 6.5  ALBUMIN 3.6 3.5   No results found for this basename: LIPASE:5,AMYLASE:5 in the last 168 hours No results found for this basename: AMMONIA:5 in the last 168 hours CBC:  Lab 10/16/12 2138 10/13/12 0610 10/12/12 2238 10/12/12 2227  WBC 7.6 7.2 7.5 --  NEUTROABS 4.0 -- 3.6 --  HGB 11.9* 11.1* 11.3* 11.6*  HCT 36.8 33.9* 34.2* 34.0*  MCV 91.3 90.9  90.0 --  PLT 259 265 286 --   Cardiac Enzymes:  Lab 10/16/12 2138 10/12/12 2238  CKTOTAL -- --  CKMB -- --  CKMBINDEX -- --  TROPONINI <0.30 <0.30    BNP (last 3 results) No results found for this basename: PROBNP:3 in the last 8760 hours CBG:  Lab 10/13/12 0230  GLUCAP 114*    Radiological Exams on Admission: Dg Chest 2 View  10/16/2012  *RADIOLOGY REPORT*  Clinical Data: Stroke symptoms.  Bronchitis.  Chest discomfort.  CHEST - 2 VIEW  Comparison: 03/26/2009.  Findings: Bilateral basilar atelectasis.  No airspace disease.  No effusion.  Deformity of the right humerus is present proximally compatible with an old fracture.  Cardiomediastinal contours appear within normal limits and unchanged from prior.  IMPRESSION: Mild basilar atelectasis.   Original Report Authenticated By: Dereck Ligas, M.D.    Ct Head Wo Contrast  10/16/2012  *RADIOLOGY REPORT*  Clinical Data: Altered mental status.  CT HEAD WITHOUT CONTRAST  Technique:  Contiguous axial images were obtained from the base of the skull through the vertex  without contrast.  Comparison: 10/14/2012 brain MRI.  Findings: Calcified meningioma along the left frontal lobe is unchanged compared to recent MRI.  Atrophy and chronic ischemic white matter disease is present.  There is no intracranial hemorrhage.  No midline shift.  Paranasal sinuses and mastoid air cells appear within normal limits. Low attenuation in the periventricular right parietal lobe may represent small infarct or severe chronic ischemic white matter change seen on prior MRI.  IMPRESSION: No acute abnormality.  Atrophy, chronic ischemic white matter disease and left frontal calcified meningioma are similar.   Original Report Authenticated By: Dereck Ligas, M.D.     EKG: Independently reviewed. Normal sinus Rhythm, No Acute S-T changes    Assessment: Present on Admission:  . TIA (transient ischemic attack) . Expressive aphasia . HTN (hypertension) . Diabetes mellitus without complication . Hyperlipidemia   Hx of CVA    Plan:       Admit to Telemetry Bed Observation Status TIA workup (will not re-order another Carotid US or 2D ECHO since done < 1 week ago) Already scheduled for TEE as Outpatient with Neuro [facility] Che[facility] Reconcile Home medications DVT prophylaxis    Code Status: FULL CODE Family Communication:  N/A Disposition Plan: Return to Home   Time spent: [facility] Pager [phone]  If 7PM-7AM, please contact night-coverage [email or web address] Password [facility] 10/17/2012, 12:52 AM

## 2012-10-17 NOTE — Progress Notes (Signed)
Patient admitted earlier today for a TIA manifested by difficulty speaking. Symptoms resolved within the hour. She had just been discharged on 12/18 with the same issues and a stroke work up was completed that was negative. MRI this admission is negative for acute event. Continue plavix. She had severe HTN at home with SBP in the 200s so I wonder if this may be the reason for her symptoms. Continue norvasc, restart metoprolol. Has a HA so will medicate PRN. Will get PT/OT to assess. Aim for DC in am.  Domingo Mend, MD [facility] Pager: [phone]

## 2012-10-18 DIAGNOSIS — R4789 Other speech disturbances: Secondary | ICD-10-CM

## 2012-10-18 LAB — GLUCOSE, CAPILLARY: Glucose-Capillary: 94 mg/dL (ref 70–99)

## 2012-10-18 MED ORDER — TRAMADOL HCL 50 MG PO TABS
50.0000 mg | ORAL_TABLET | Freq: Once | ORAL | Status: AC
Start: 2012-10-18 — End: 2012-10-18
  Administered 2012-10-18: 50 mg via ORAL
  Filled 2012-10-18: qty 1

## 2012-10-18 MED ORDER — AMLODIPINE BESYLATE 5 MG PO TABS: 10.0000 mg | ORAL_TABLET | Freq: Every day | ORAL | Status: DC

## 2012-10-18 NOTE — Discharge Summary (Addendum)
Physician Discharge Summary  Patient ID: Charlene Wade MRN: [id number] DOB/AGE: 76-Aug-1929 76 y.o.  Admit date: 10/16/2012 Discharge date: 10/18/2012  Primary Care Physician:  Provider Not In System   Discharge Diagnoses:    Principal Problem:  *TIA (transient ischemic attack) Active Problems:  Hypertensive encephalopathy  Diabetes mellitus without complication  H/O: CVA (cerebrovascular accident)  HTN (hypertension)  Hyperlipidemia  Expressive aphasia      Medication List     As of 10/18/2012 11:36 AM    TAKE these medications         albuterol 108 (90 BASE) MCG/ACT inhaler   Commonly known as: PROVENTIL HFA;VENTOLIN HFA   Inhale 1-2 puffs into the lungs every 6 (six) hours as needed. For shortness of breath and wheezing      amLODipine 5 MG tablet   Commonly known as: NORVASC   Take 1 tablet (5 mg total) by mouth daily.      buPROPion 75 MG tablet   Commonly known as: WELLBUTRIN   Take 75 mg by mouth daily.      CALTRATE 600+D 600-400 MG-UNIT per tablet   Generic drug: Calcium Carbonate-Vitamin D   Take 2 tablets by mouth daily.      Cinnamon 500 MG capsule   Take 1,000 mg by mouth daily.      clopidogrel 75 MG tablet   Commonly known as: PLAVIX   Take 1 tablet (75 mg total) by mouth daily with breakfast.      Co Q 10 100 MG Caps   Take 1 tablet by mouth daily. Hold while in hospital      denosumab 60 MG/ML Soln injection   Commonly known as: PROLIA   Inject 60 mg into the skin every 6 (six) months. Administer in upper arm, thigh, or abdomen      fenofibrate 145 MG tablet   Commonly known as: TRICOR   Take 145 mg by mouth daily.      fish oil-omega-3 fatty acids 1000 MG capsule   Take 1 g by mouth daily.      fluticasone 50 MCG/ACT nasal spray   Commonly known as: FLONASE   Place 1 spray into the nose 2 (two) times daily.      metoprolol succinate 50 MG 24 hr tablet   Commonly known as: TOPROL-XL   Take 50 mg by mouth daily. Take with or  immediately following a meal.      multivitamin with minerals Tabs   Take 1 tablet by mouth daily.      omeprazole 20 MG capsule   Commonly known as: PRILOSEC   Take 20 mg by mouth daily.      OSTEO BI-FLEX REGULAR STRENGTH 250-200 MG Tabs   Generic drug: Glucosamine-Chondroitin   Take 1 tablet by mouth daily. Hold while in hospital      potassium chloride SA 20 MEQ tablet   Commonly known as: K-DUR,KLOR-CON   Take 20 mEq by mouth 2 (two) times daily.      pravastatin 80 MG tablet   Commonly known as: PRAVACHOL   Take 80 mg by mouth at bedtime.      vitamin B-12 1000 MCG tablet   Commonly known as: CYANOCOBALAMIN   Take 1,000 mcg by mouth daily.         Disposition and Follow-up:  Will be discharged home today in stable and improved condition. I have asked her to follow up with her PCP in 2 weeks.  Consults:  None  Significant Diagnostic Studies:  Dg Chest 2 View  10/16/2012  *RADIOLOGY REPORT*  Clinical Data: Stroke symptoms.  Bronchitis.  Chest discomfort.  CHEST - 2 VIEW  Comparison: 03/26/2009.  Findings: Bilateral basilar atelectasis.  No airspace disease.  No effusion.  Deformity of the right humerus is present proximally compatible with an old fracture.  Cardiomediastinal contours appear within normal limits and unchanged from prior.  IMPRESSION: Mild basilar atelectasis.   Original Report Authenticated By: Dereck Ligas, M.D.    Ct Head Wo Contrast  10/16/2012  *RADIOLOGY REPORT*  Clinical Data: Altered mental status.  CT HEAD WITHOUT CONTRAST  Technique:  Contiguous axial images were obtained from the base of the skull through the vertex without contrast.  Comparison: 10/14/2012 brain MRI.  Findings: Calcified meningioma along the left frontal lobe is unchanged compared to recent MRI.  Atrophy and chronic ischemic white matter disease is present.  There is no intracranial hemorrhage.  No midline shift.  Paranasal sinuses and mastoid air cells appear within normal  limits. Low attenuation in the periventricular right parietal lobe may represent small infarct or severe chronic ischemic white matter change seen on prior MRI.  IMPRESSION: No acute abnormality.  Atrophy, chronic ischemic white matter disease and left frontal calcified meningioma are similar.   Original Report Authenticated By: Dereck Ligas, M.D.    Mri Brain Without Contrast  10/17/2012  *RADIOLOGY REPORT*  Clinical Data: The tip T8.  Episode of speech difficulties yesterday.  The examination had to be discontinued prior to completion due to patient refusal for further imaging.  MRI HEAD WITHOUT CONTRAST  Technique:  Multiplanar, multiecho pulse sequences of the brain and surrounding structures were obtained according to standard protocol without intravenous contrast.  Comparison: CT head without contrast 11/17/2011.  MRI brain without and with contrast 10/14/2012.  Findings:  The diffusion weighted images demonstrate no evidence for acute or subacute infarction.  The study is mildly degraded by patient motion.  Degenerative changes are again noted in the upper cervical spine.  Extensive periventricular and subcortical white matter changes are similar to the prior study.  Flow is present in the major intracranial arteries.  The patient is status post bilateral lens extractions.  Mild mucosal thickening is present in the ethmoid air cells.  The paranasal sinuses and mastoid air cells are otherwise clear.  A relatively empty sella is noted.  The calcified left frontal meningioma is again noted.  IMPRESSION: 1.  No acute intracranial abnormality or significant interval change. 2.  Stable atrophy and extensive white matter disease. 3.  Calcified left frontal meningioma.   Original Report Authenticated By: San Morelle, M.D.     Brief H and P: For complete details please refer to admission H and P, but in brief patient is an 78 y.o. female who presents to the ED with complaints of difficulty speaking  that started at around 4 pm, her symptoms resolved while she was in the ED. She also reports having a severe dull 10/10 headache. She was inpatient days ago with similar symptoms and was diagnosed with a TIA or a small embolic CVA and was to follow-up as an outpatient for a TEE.  We were asked to admit her for further evaluation and management.     Hospital Course:  Principal Problem:  *TIA (transient ischemic attack) Active Problems:  Hypertensive encephalopathy  Diabetes mellitus without complication  H/O: CVA (cerebrovascular accident)  HTN (hypertension)  Hyperlipidemia  Expressive aphasia   Transient Episode of Slurred Speech -I believe  this represents either a repeat TIA or Hypertensive Encephalopathy as her SBP had been in th 200s the day of admission. -Her symptoms have resolved. -MRI does not show a CVA. -She had a stroke w/u earlier this week including ECHO/carotid dopplers that were normal. -Has a TEE scheduled OP for 12/26. -To continue plavix. -SBP has improved to the 150-160 range. -Will need close OP follow up to monitor her BP. -HHPT/OT will be arranged.  Rest of chronic medical issues have been stable and her home medications have not been altered.   Time spent on Discharge: Greater than 30 minutes.  SignedLelon Frohlich [facility] Pager: [phone] 10/18/2012, 11:36 AM    ADDENDUM: As patient was being discharged, I was informed by RN that her heart rate is the upper 40s, low 50s. Sinus brady on tele. Will DC metoprolol and increase her norvasc dose from 5 to 10 mg.

## 2012-10-18 NOTE — Progress Notes (Signed)
NCM spoke to family member and she did ask her pt about [facility]. Pt states she had [facility] in the past and wanted their services again for [facility]. Faxed facesheet and orders to [facility] for scheduled d/c today. Pt has contact info for [facility] Jonnie Finner RN CCM Case Mgmt phone [phone]

## 2012-10-18 NOTE — Evaluation (Signed)
Physical Therapy Evaluation Patient Details Name: Charlene Wade MRN: [id number] DOB: 01-Jan-1928 Today's Date: 10/18/2012 Time: 0817-0900 PT Time Calculation (min): 43 min  PT Assessment / Plan / Recommendation Clinical Impression  Pt pleasant 78 yo female admitted for headaches and possible TIA. Pt recently just d/c'd 2 days ago for same symptoms. Pt con't to function at baseline with safe compensatory strategies. patient expresses concern re: falling when she returns home alone however when SNF discussed patient not interested. Patient reports "I'm going to call a friend to stay with me for a little while. Patient to benefit from HHPT to improve functional independence and overall activity tolerance due to generalized deconditioning from 2 recent stays in hospital. Patient to benefit from Walker and 24/7 supervision initially upon going home due to mild set back in functional mobility.    PT Assessment  Patient needs continued PT services    Follow Up Recommendations  Home health PT;Supervision/Assistance - 24 hour    Does the patient have the potential to tolerate intense rehabilitation      Barriers to Discharge Decreased caregiver support      Equipment Recommendations  None recommended by PT    Recommendations for Other Services     Frequency Min 3X/week    Precautions / Restrictions Precautions Precautions: Fall   Pertinent Vitals/Pain 6/10 headache      Mobility  Bed Mobility Bed Mobility: Not assessed Transfers Transfers: Sit to Stand;Stand to Sit Sit to Stand: 5: Supervision;With upper extremity assist;From chair/3-in-1;From toilet Stand to Sit: 5: Supervision;With upper extremity assist;With armrests;To chair/3-in-1;To toilet Details for Transfer Assistance: cues to push up from arm rests vs pulling on RW Ambulation/Gait Ambulation/Gait Assistance: 5: Supervision Ambulation Distance (Feet): 150 Feet Assistive device: Rolling walker Ambulation/Gait Assistance  Details: pt slow however no episodes of LOB. pt uses rollator at home Gait Pattern: Step-through pattern;Decreased stride length Gait velocity: wfl for age Stairs: No Wheelchair Mobility Wheelchair Mobility: No    Shoulder Instructions     Exercises     PT Diagnosis: Generalized weakness  PT Problem List: Decreased activity tolerance;Decreased mobility;Decreased balance PT Treatment Interventions: Gait training;Functional mobility training;Therapeutic exercise;Neuromuscular re-education   PT Goals Acute Rehab PT Goals PT Goal Formulation: With patient Time For Goal Achievement: 10/25/12 Potential to Achieve Goals: Good Pt will go Supine/Side to Sit: Independently;with HOB 0 degrees PT Goal: Supine/Side to Sit - Progress: Goal set today Pt will go Sit to Stand: with modified independence;with upper extremity assist PT Goal: Sit to Stand - Progress: Goal set today Pt will go Stand to Sit: with modified independence;with upper extremity assist PT Goal: Stand to Sit - Progress: Goal set today Pt will Ambulate: >150 feet;with modified independence;with rolling walker PT Goal: Ambulate - Progress: Goal set today  Visit Information  Last PT Received On: 10/18/12 Assistance Needed: +1    Subjective Data  Subjective: Pt received sitting up in chair with report "I still have a headache."   Prior Functioning  Home Living Lives With: Alone Available Help at Discharge: Available PRN/intermittently;Personal care attendant Type of Home: House Home Access: Level entry Home Layout: One level Bathroom Shower/Tub: Multimedia programmer: Standard Bathroom Accessibility: Yes How Accessible: Accessible via walker Home Adaptive Equipment: Walker - four wheeled;Grab bars in shower;Shower chair with back Prior Function Level of Independence: Independent with assistive device(s) (uses rollator) Driving: No Communication Communication: No difficulties Dominant Hand: Right     Cognition  Overall Cognitive Status: Appears within functional limits for tasks assessed/performed  Arousal/Alertness: Awake/alert Orientation Level: Appears intact for tasks assessed Behavior During Session: [facility] for tasks performed    Extremity/Trunk Assessment Right Upper Extremity Assessment RUE ROM/Strength/Tone: [facility] for tasks assessed Left Upper Extremity Assessment LUE ROM/Strength/Tone: WFL for tasks assessed Right Lower Extremity Assessment RLE ROM/Strength/Tone: [facility] for tasks assessed Left Lower Extremity Assessment LLE ROM/Strength/Tone: WFL for tasks assessed Trunk Assessment Trunk Assessment: Kyphotic   Balance Balance Balance Assessed: Yes Dynamic Standing Balance Dynamic Standing - Balance Support: During functional activity Dynamic Standing - Level of Assistance: 5: Stand by assistance Dynamic Standing - Balance Activities:  (stood at sink to wash hands and brush teeth) Dynamic Standing - Comments: pt able to wash hand post tolieting and brush teeth with supervision. no lob. assist required for management of underwear and pad due to large gown and wire from telemtry box, otherwise patient able to toliet mod independently with use of grab bars which she has at home  End of Session PT - End of Session Equipment Utilized During Treatment: Gait belt Activity Tolerance: Patient tolerated treatment well Patient left: in chair;with call bell/phone within reach Nurse Communication: Mobility status  GP Functional Assessment Tool Used: clinical judgement Functional Limitation: Mobility: Walking and moving around Mobility: Walking and Moving Around Current Status [id number]): At least 1 percent but less than 20 percent impaired, limited or restricted Mobility: Walking and Moving Around Goal Status [phone]): 0 percent impaired, limited or restricted   Kingsley Callander 10/18/2012, 9:11 AM  Kittie Plater, PT, DPT Pager #: [phone] Office #: [phone]

## 2012-10-18 NOTE — Progress Notes (Signed)
Patient blood pressure 183/49, HR 54, patient asymptomatic.  Notified Dr. Erline Hau, MD of patient's vital signs and that patient was due for norvasc and metoprolol.  MD advised to give both medications as scheduled.  Will continue to monitor.

## 2012-10-18 NOTE — Progress Notes (Signed)
Patient's blood pressure 152/39 , heart rate 48.  Dr. Erline Hau, MD notified of patient vitals.  Patient asymptomatic.  New orders received.  Will continue to monitor.

## 2012-10-18 NOTE — Progress Notes (Signed)
Patient discharged home with sister.  Home health is being arranged with [facility].  Patient escorted via wheelchair by [place], [place], to sister's vehicle.  Discharge instructions given, all questions answered.

## 2012-10-19 ENCOUNTER — Encounter (HOSPITAL_COMMUNITY): Payer: Self-pay

## 2012-10-22 ENCOUNTER — Encounter (HOSPITAL_COMMUNITY)
Admission: RE | Disposition: A | Discharge status: Home Health | Payer: Self-pay | Source: Ambulatory Visit | Attending: Cardiovascular Disease

## 2012-10-22 ENCOUNTER — Ambulatory Visit (HOSPITAL_COMMUNITY)
Admission: RE | Admit: 2012-10-22 | Discharge: 2012-10-22 | Disposition: A | Discharge status: Home Health | Payer: Medicare Other | Source: Ambulatory Visit | Attending: Cardiovascular Disease | Admitting: Cardiovascular Disease

## 2012-10-22 ENCOUNTER — Encounter (HOSPITAL_COMMUNITY): Payer: Self-pay | Admitting: *Deleted

## 2012-10-22 DIAGNOSIS — E119 Type 2 diabetes mellitus without complications: Secondary | ICD-10-CM | POA: Insufficient documentation

## 2012-10-22 DIAGNOSIS — G459 Transient cerebral ischemic attack, unspecified: Secondary | ICD-10-CM

## 2012-10-22 DIAGNOSIS — R4701 Aphasia: Secondary | ICD-10-CM

## 2012-10-22 DIAGNOSIS — I10 Essential (primary) hypertension: Secondary | ICD-10-CM | POA: Insufficient documentation

## 2012-10-22 DIAGNOSIS — E785 Hyperlipidemia, unspecified: Secondary | ICD-10-CM | POA: Insufficient documentation

## 2012-10-22 DIAGNOSIS — Z8673 Personal history of transient ischemic attack (TIA), and cerebral infarction without residual deficits: Secondary | ICD-10-CM

## 2012-10-22 HISTORY — PX: TEE WITHOUT CARDIOVERSION: SHX5443

## 2012-10-22 HISTORY — DX: Gastro-esophageal reflux disease without esophagitis: K21.9

## 2012-10-22 SURGERY — ECHOCARDIOGRAM, TRANSESOPHAGEAL
Anesthesia: Moderate Sedation

## 2012-10-22 MED ORDER — MIDAZOLAM HCL 10 MG/2ML IJ SOLN
INTRAMUSCULAR | Status: DC | PRN
  Administered 2012-10-22 (×2): 2 mg via INTRAVENOUS

## 2012-10-22 MED ORDER — FENTANYL CITRATE 0.05 MG/ML IJ SOLN
INTRAMUSCULAR | Status: AC
  Filled 2012-10-22: qty 2

## 2012-10-22 MED ORDER — MIDAZOLAM HCL 5 MG/ML IJ SOLN
INTRAMUSCULAR | Status: AC
  Filled 2012-10-22: qty 2

## 2012-10-22 MED ORDER — FENTANYL CITRATE 0.05 MG/ML IJ SOLN
INTRAMUSCULAR | Status: DC | PRN
  Administered 2012-10-22 (×2): 25 ug via INTRAVENOUS

## 2012-10-22 MED ORDER — SODIUM CHLORIDE 0.9 % IV SOLN
INTRAVENOUS | Status: DC
  Administered 2012-10-22: 11:00:00 via INTRAVENOUS

## 2012-10-22 MED ORDER — BUTAMBEN-TETRACAINE-BENZOCAINE 2-2-14 % EX AERO
INHALATION_SPRAY | CUTANEOUS | Status: DC | PRN
  Administered 2012-10-22: 2 via TOPICAL

## 2012-10-22 NOTE — H&P (View-Only) (Signed)
[facility] History and Physical  Charlene Wade [id number] DOB: May 20, 1928 DOA: 10/16/2012  Referring physician: EDP PCP: Provider Not In System Marella Chimes PA at [place]) Specialists:   Chief Complaint: Difficulty speaking  HPI: Charlene Wade is a 78 y.o. female who presents to the ED with complaints of difficulty speaking that started at around 4 pm, her symptoms resolved while she was in the ED.   She also reports having a severe dull 10/10 headache.  She was inpatient days ago with similar symptoms and was diagnosed with a TIA or a small embolic CVA and was to follow-up as an outpatient for a TEE.     Review of Systems: The patient denies anorexia, fever, weight loss, vision loss, decreased hearing, hoarseness, chest pain, syncope, dyspnea on exertion, peripheral edema, balance deficits, hemoptysis, abdominal pain, melena, hematochezia, severe indigestion/heartburn, nausea, vomiting, constipation, diarrhea, hematuria, incontinence, dysuria, genital sores, muscle weakness, suspicious skin lesions, transient blindness, difficulty walking, depression, unusual weight change, abnormal bleeding, enlarged lymph nodes, angioedema, and breast masses.    Past Medical History  Diagnosis Date  . Ectopic pregnancy   . Diabetes mellitus without complication   . Stroke   . Hypertension   . Hyperlipidemia    Past Surgical History  Procedure Date  . Abdominal hysterectomy   . Breast reduction surgery   . Laparoscopic salpingoopherectomy   . Appendectomy   . Nasal mass excision     Basal Cell Cancer Excision  . Nasal reconstruction     Medications:  HOME MEDS: Prior to Admission medications   Medication Sig Start Date End Date Taking? Authorizing Provider  albuterol (PROVENTIL HFA;VENTOLIN HFA) 108 (90 BASE) MCG/ACT inhaler Inhale 1-2 puffs into the lungs every 6 (six) hours as needed. For shortness of breath and wheezing   Yes Historical Provider, MD  amLODipine  (NORVASC) 5 MG tablet Take 1 tablet (5 mg total) by mouth daily. 10/14/12  Yes Ripudeep Krystal Eaton, MD  buPROPion (WELLBUTRIN) 75 MG tablet Take 75 mg by mouth daily.   Yes Historical Provider, MD  Calcium Carbonate-Vitamin D (CALTRATE 600+D) 600-400 MG-UNIT per tablet Take 2 tablets by mouth daily.   Yes Historical Provider, MD  Cinnamon 500 MG capsule Take 1,000 mg by mouth daily.   Yes Historical Provider, MD  clopidogrel (PLAVIX) 75 MG tablet Take 1 tablet (75 mg total) by mouth daily with breakfast. 10/14/12  Yes Ripudeep K Rai, MD  Coenzyme Q10 (CO Q 10) 100 MG CAPS Take 1 tablet by mouth daily. Hold while in hospital   Yes Historical Provider, MD  denosumab (PROLIA) 60 MG/ML SOLN injection Inject 60 mg into the skin every 6 (six) months. Administer in upper arm, thigh, or abdomen   Yes Historical Provider, MD  fenofibrate (TRICOR) 145 MG tablet Take 145 mg by mouth daily.   Yes Historical Provider, MD  fish oil-omega-3 fatty acids 1000 MG capsule Take 1 g by mouth daily.   Yes Historical Provider, MD  fluticasone (FLONASE) 50 MCG/ACT nasal spray Place 1 spray into the nose 2 (two) times daily.   Yes Historical Provider, MD  Glucosamine-Chondroitin (OSTEO BI-FLEX REGULAR STRENGTH) 250-200 MG TABS Take 1 tablet by mouth daily. Hold while in hospital   Yes Historical Provider, MD  metoprolol succinate (TOPROL-XL) 50 MG 24 hr tablet Take 50 mg by mouth daily. Take with or immediately following a meal.   Yes Historical Provider, MD  Multiple Vitamin (MULITIVITAMIN WITH MINERALS) TABS Take 1 tablet by mouth daily.  Yes Historical Provider, MD  omeprazole (PRILOSEC) 20 MG capsule Take 20 mg by mouth daily.   Yes Historical Provider, MD  potassium chloride SA (K-DUR,KLOR-CON) 20 MEQ tablet Take 20 mEq by mouth 2 (two) times daily. 10/14/12  Yes Ripudeep Krystal Eaton, MD  pravastatin (PRAVACHOL) 80 MG tablet Take 80 mg by mouth at bedtime.   Yes Historical Provider, MD  vitamin B-12 (CYANOCOBALAMIN) 1000 MCG  tablet Take 1,000 mcg by mouth daily.   Yes Historical Provider, MD    Allergies:  Allergies  Allergen Reactions  . Ace Inhibitors     unknown  . Penicillins     unknown  . Sulfa Antibiotics     unknown    Social History:   reports that she has quit smoking. She has never used smokeless tobacco. She reports that she does not drink alcohol or use illicit drugs.  Family History: Family History  Problem Relation Age of Onset  . Hypertension Father   . Hypertension Mother   . Cancer - Lung Brother     Physical Exam:  GEN: Pleasant thin Elderly 78  Year old Caucasian Female examined  and in no acute distress; cooperative with exam Filed Vitals:  10/16/12 2102 10/16/12 2154 10/17/12 0034 BP: 166/48  169/47 Pulse: 70  69 Temp: 98 F (36.7 C) 98.1 F (36.7 C)  TempSrc: Oral   Resp: 22  18 SpO2: 100%  96%  Blood pressure 169/47, pulse 69, temperature 98.1 F (36.7 C), temperature source Oral, resp. rate 18, SpO2 96.00%. PSYCH: She is alert and oriented x4; does not appear anxious does not appear depressed; affect is normal HEENT: Normocephalic and Atraumatic, Mucous membranes pink; PERRLA; EOM intact; Fundi:  Benign;  No scleral icterus, Nares: Patent, Oropharynx: Clear, Fair Dentition, Neck:  FROM, no cervical lymphadenopathy nor thyromegaly or carotid bruit; no JVD; Breasts:: Not examined CHEST WALL: No tenderness CHEST: Normal respiration, clear to auscultation bilaterally HEART: Regular rate and rhythm; no murmurs rubs or gallops BACK: No kyphosis or scoliosis; no CVA tenderness ABDOMEN: Positive Bowel Sounds, soft non-tender; no masses, no organomegaly. Rectal Exam: Not done EXTREMITIES: No bone or joint deformity; age-appropriate arthropathy of the hands and knees; no cyanosis, clubbing or edema; no ulcerations. Genitalia: not examined PULSES: 2+ and symmetric SKIN: Normal hydration no rash or ulceration CNS: Cranial nerves 2-12 grossly intact no focal neurologic  deficit    Labs on Admission:  Basic Metabolic Panel:  Lab 0000000 2138 10/14/12 0850 10/13/12 0610 10/12/12 2238 10/12/12 2227  NA 139 142 146* 141 143  K 4.4 3.4* 3.0* 3.0* 3.0*  CL 105 106 106 102 104  CO2 24 25 28 27  --  GLUCOSE 110* 98 100* 138* 135*  BUN 18 13 17 18 19   CREATININE 1.24* 0.85 0.91 0.99 1.00  CALCIUM 9.7 10.0 10.3 10.6* --  MG -- -- -- -- --  PHOS -- -- -- -- --   Liver Function Tests:  Lab 10/16/12 2138 10/12/12 2238  AST 24 22  ALT 6 6  ALKPHOS 36* 36*  BILITOT 0.3 0.4  PROT 6.7 6.5  ALBUMIN 3.6 3.5   No results found for this basename: LIPASE:5,AMYLASE:5 in the last 168 hours No results found for this basename: AMMONIA:5 in the last 168 hours CBC:  Lab 10/16/12 2138 10/13/12 0610 10/12/12 2238 10/12/12 2227  WBC 7.6 7.2 7.5 --  NEUTROABS 4.0 -- 3.6 --  HGB 11.9* 11.1* 11.3* 11.6*  HCT 36.8 33.9* 34.2* 34.0*  MCV 91.3 90.9  90.0 --  PLT 259 265 286 --   Cardiac Enzymes:  Lab 10/16/12 2138 10/12/12 2238  CKTOTAL -- --  CKMB -- --  CKMBINDEX -- --  TROPONINI <0.30 <0.30    BNP (last 3 results) No results found for this basename: PROBNP:3 in the last 8760 hours CBG:  Lab 10/13/12 0230  GLUCAP 114*    Radiological Exams on Admission: Dg Chest 2 View  10/16/2012  *RADIOLOGY REPORT*  Clinical Data: Stroke symptoms.  Bronchitis.  Chest discomfort.  CHEST - 2 VIEW  Comparison: 03/26/2009.  Findings: Bilateral basilar atelectasis.  No airspace disease.  No effusion.  Deformity of the right humerus is present proximally compatible with an old fracture.  Cardiomediastinal contours appear within normal limits and unchanged from prior.  IMPRESSION: Mild basilar atelectasis.   Original Report Authenticated By: Dereck Ligas, M.D.    Ct Head Wo Contrast  10/16/2012  *RADIOLOGY REPORT*  Clinical Data: Altered mental status.  CT HEAD WITHOUT CONTRAST  Technique:  Contiguous axial images were obtained from the base of the skull through the vertex  without contrast.  Comparison: 10/14/2012 brain MRI.  Findings: Calcified meningioma along the left frontal lobe is unchanged compared to recent MRI.  Atrophy and chronic ischemic white matter disease is present.  There is no intracranial hemorrhage.  No midline shift.  Paranasal sinuses and mastoid air cells appear within normal limits. Low attenuation in the periventricular right parietal lobe may represent small infarct or severe chronic ischemic white matter change seen on prior MRI.  IMPRESSION: No acute abnormality.  Atrophy, chronic ischemic white matter disease and left frontal calcified meningioma are similar.   Original Report Authenticated By: Dereck Ligas, M.D.     EKG: Independently reviewed. Normal sinus Rhythm, No Acute S-T changes    Assessment: Present on Admission:  . TIA (transient ischemic attack) . Expressive aphasia . HTN (hypertension) . Diabetes mellitus without complication . Hyperlipidemia   Hx of CVA    Plan:       Admit to Telemetry Bed Observation Status TIA workup (will not re-order another Carotid US or 2D ECHO since done < 1 week ago) Already scheduled for TEE as Outpatient with Neuro Neuro Checks Reconcile Home medications DVT prophylaxis    Code Status: FULL CODE Family Communication:  N/A Disposition Plan: Return to Home   Time spent: [facility] Pager [phone]  If 7PM-7AM, please contact night-coverage [email or web address] Password [facility] 10/17/2012, 12:52 AM

## 2012-10-22 NOTE — Interval H&P Note (Signed)
History and Physical Interval Note:  10/22/2012 10:31 AM  Charlene Wade  has presented today for surgery, with the diagnosis of cva  The various methods of treatment have been discussed with the patient and family. After consideration of risks, benefits and other options for treatment, the patient has consented to  Procedure(s) (LRB) with comments: TRANSESOPHAGEAL ECHOCARDIOGRAM (TEE) (N/A) as a surgical intervention .  The patient's history has been reviewed, patient examined, no change in status, stable for surgery.  I have reviewed the patient's chart and labs.  Questions were answered to the patient's satisfaction.     [name]

## 2012-10-22 NOTE — Progress Notes (Signed)
  Echocardiogram Echocardiogram Transesophageal has been performed.  Mauricio Po 10/22/2012, 11:42 AM

## 2012-10-22 NOTE — CV Procedure (Signed)
TEE:  4 mg versed and 50ug of fentanyl  Normal EF 60% mild LVH MAC with mild gradient across MV mean 15mmHg no MS by MVA greater than 4cm2 mild MR Moderate LAE Normal RV Normal TV, PV and AV Mild mural aortic debris normal for age No ASD/PFO  Negative bubble study.  [name] 11:36 AM 10/22/2012

## 2012-10-23 ENCOUNTER — Encounter (HOSPITAL_COMMUNITY): Payer: Self-pay | Admitting: Cardiovascular Disease

## 2013-01-26 ENCOUNTER — Encounter: Payer: Self-pay | Admitting: Neurology

## 2013-01-26 ENCOUNTER — Ambulatory Visit (INDEPENDENT_AMBULATORY_CARE_PROVIDER_SITE_OTHER): Payer: Medicare Other | Admitting: Neurology

## 2013-01-26 VITALS — BP 135/62 | HR 65 | Temp 96.6°F | Ht <= 58 in | Wt 157.0 lb

## 2013-01-26 DIAGNOSIS — G459 Transient cerebral ischemic attack, unspecified: Secondary | ICD-10-CM

## 2013-01-26 NOTE — Patient Instructions (Signed)
She was advised to continue Plavix for secondary stroke prevention and maintain strict control of hypertension but blood pressure goal below 130/90 and lipids his LDL cholesterol goal below 100 mg percent. I also encouraged her to diet as well as increase her physical activity and lose weight. She was advised to return for followup in 6 months with Gilford Raid, nurse practitioner

## 2013-01-26 NOTE — Progress Notes (Signed)
HPI: [name] is 79 year old Caucasian lady seen for followup today following hospital admission in December 2013 for TIA. She was admitted with hypertensive emergency and had transient slurred speech and right-sided weakness. CT head was negative for acute infarct and showed only white matter changes. Transthoracic echo showed normal ejection fraction. Transesophageal echocardiogram done subsequently as an outpatient showed no evidence of cardiac source of embolism or patent foramen ovale. Carotid Doppler showed no significant extracranial stenosis. Hb A1c was 5. Lipid profile was normal. Patient had previously had right temporal MCA branch infarct in August 2013 and was admitted to [facility] that time. She was placed on aspirin which was subsequently changed to Aggrenox by her primary physician. She however for some reason had stopped taking Aggrenox when she was admitted in December. Patient states she's done well on the Plavix and has not had any bleeding though she has minor bruising. She states her blood pressure is under good control and today it is 135/62 in office. Her lipid profile last checked in December was fine. She states that she's lost some weight and plans to be more physically active. She uses a walker mostly but uses a cane for short distances. She states she has recovered physically quite well from her previous stroke in August and has no new neurological complaints. ROS: 14 system review of systems is positive for fatigue, swelling in the legs, hearing loss, most, shortness of breath, wheezing, snoring, incontinence, constipation, joint pain, cramps and aching muscles, allergies skin sensitivity, memory loss, confusion, numbness, weakness, sleepiness restless legs, anxiety, depression, decreased energy. Physical Exam General: well developed, well nourished, seated, in no evident distress Head: head normocephalic and atraumatic. Orohparynx benign Neck: supple  with no carotid or supraclavicular bruits Cardiovascular: regular rate and rhythm, no murmurs Musculoskeletal mild kyphosis Skin mild pedal edema. No rash.  Neurologic Exam Mental Status: Awake and fully alert. Oriented to place and time. Recent and remote memory intact. Attention span, concentration and fund of knowledge appropriate. Mood and affect appropriate. Mini-Mental status exam score 28/30 with one deficit in orientation and recall. Animal fluency test 8. Clock drawing 4/4. Geriatric depression scale not depressed. Cranial Nerves: Fundoscopic exam reveals sharp disc margins. Pupils equal, briskly reactive to light. Extraocular movements full without nystagmus. Visual fields full to confrontation. Hearing intact and symmetric to finer snap. Facial sensation intact. Face, tongue, palate move normally and symmetrically. Neck flexion and extension normal.  Motor: Normal bulk and tone. Normal strength in all tested extremity muscles.Diminished fine finger movements on the left. Orbits right over left upper extremity. Sensory.: intact to tough and pinprick and vibratory.  Coordination: Rapid alternating movements normal in all extremities. Finger-to-nose and heel-to-shin performed accurately bilaterally. Gait and Station: Arises from chair without difficulty. Stance is normal. Gait demonstrates normal stride length and balance . Not able to heel, toe and tandem walk without difficulty. Uses a cane. Reflexes: 1+ and symmetric. Toes downgoing.     ASSESSMENT::  79 year old lady with left hemispheric TIA in December 2013 and right MCA branch infarcts likely of embolic etiology without definite identified source of embolism. 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 I had a long discussion with the patient and her friend and advised her to be compliant with her medications and risk factor control.  I also encouraged her to be physically active and eat healthy diet and lose weight. She was advised to return for followup with Charlene Wade nurse practitioner in 6 months or call earlier if necessary.

## 2013-06-09 DIAGNOSIS — H9190 Unspecified hearing loss, unspecified ear: Secondary | ICD-10-CM | POA: Insufficient documentation

## 2013-06-09 DIAGNOSIS — M4800 Spinal stenosis, site unspecified: Secondary | ICD-10-CM | POA: Insufficient documentation

## 2013-06-09 DIAGNOSIS — M810 Age-related osteoporosis without current pathological fracture: Secondary | ICD-10-CM | POA: Insufficient documentation

## 2013-06-09 DIAGNOSIS — F329 Major depressive disorder, single episode, unspecified: Secondary | ICD-10-CM | POA: Insufficient documentation

## 2013-06-09 DIAGNOSIS — I6350 Cerebral infarction due to unspecified occlusion or stenosis of unspecified cerebral artery: Secondary | ICD-10-CM | POA: Insufficient documentation

## 2013-06-09 DIAGNOSIS — G459 Transient cerebral ischemic attack, unspecified: Secondary | ICD-10-CM | POA: Insufficient documentation

## 2013-06-09 DIAGNOSIS — M549 Dorsalgia, unspecified: Secondary | ICD-10-CM | POA: Insufficient documentation

## 2013-06-09 DIAGNOSIS — G5790 Unspecified mononeuropathy of unspecified lower limb: Secondary | ICD-10-CM | POA: Insufficient documentation

## 2013-07-28 ENCOUNTER — Ambulatory Visit: Payer: Medicare Other | Admitting: Nurse Practitioner

## 2013-08-17 ENCOUNTER — Ambulatory Visit (INDEPENDENT_AMBULATORY_CARE_PROVIDER_SITE_OTHER): Payer: Medicare Other | Admitting: Nurse Practitioner

## 2013-08-17 ENCOUNTER — Encounter: Payer: Self-pay | Admitting: Nurse Practitioner

## 2013-08-17 ENCOUNTER — Encounter (INDEPENDENT_AMBULATORY_CARE_PROVIDER_SITE_OTHER): Payer: Self-pay

## 2013-08-17 VITALS — BP 155/64 | HR 67 | Temp 97.6°F | Ht <= 58 in | Wt 168.0 lb

## 2013-08-17 DIAGNOSIS — I10 Essential (primary) hypertension: Secondary | ICD-10-CM

## 2013-08-17 DIAGNOSIS — G459 Transient cerebral ischemic attack, unspecified: Secondary | ICD-10-CM

## 2013-08-17 NOTE — Progress Notes (Signed)
[facility]  PATIENT: Charlene Wade DOB: November 06, 1927   REASON FOR VISIT: follow up HISTORY FROM: patient  HISTORY OF PRESENT ILLNESS: 01/26/13 (PS): Ms. Trouten is 79 year old Caucasian lady seen for followup today following hospital admission in December 2013 for TIA. She was admitted with hypertensive emergency and had transient slurred speech and right-sided weakness. CT head was negative for acute infarct and showed only white matter changes. Transthoracic echo showed normal ejection fraction. Transesophageal echocardiogram done subsequently as an outpatient showed no evidence of cardiac source of embolism or patent foramen ovale. Carotid Doppler showed no significant extracranial stenosis. Hb A1c was 5. Lipid profile was normal. Patient had previously had right temporal MCA branch infarct in August 2013 and was admitted to [facility] that time. She was placed on aspirin which was subsequently changed to Aggrenox by her primary physician. She however for some reason had stopped taking Aggrenox when she was admitted in December. Patient states she's done well on the Plavix and has not had any bleeding though she has minor bruising. She states her blood pressure is under good control and today it is 135/62 in office. Her lipid profile last checked in December was fine. She states that she's lost some weight and plans to be more physically active. She uses a walker mostly but uses a cane for short distances. She states she has recovered physically quite well from her previous stroke in August and has no new neurological complaints.   UPDATE 08/17/13 (LL):  Ms. Lattanzio comes in for stroke 6 month revisit.  She states she is doing well, BP is controlled.  She reports no falls.  She does not know when the last time she had blood work was at her PCP.  She is tolerating Plavix and baby aspirin well with no signs of excessive bruising.  She states she has just  finished another round of physical therapy.  ROS:  14 system review of systems is positive for fatigue, weight gain, swelling in the legs, hearing loss, shortness of breath, cough, wheezing, snoring, incontinence, joint pain, cramps, feeling cold  ALLERGIES: Allergies  Allergen Reactions  . Ace Inhibitors     unknown  . Codeine     unknown  . Levaquin [Levofloxacin]     unknown  . Penicillins     unknown  . Sulfa Antibiotics     unknown    HOME MEDICATIONS: Outpatient Prescriptions Prior to Visit  Medication Sig Dispense Refill  . albuterol (PROVENTIL HFA;VENTOLIN HFA) 108 (90 BASE) MCG/ACT inhaler Inhale 1-2 puffs into the lungs every 6 (six) hours as needed. For shortness of breath and wheezing      . amLODipine (NORVASC) 5 MG tablet Take 2 tablets (10 mg total) by mouth daily.  60 tablet  3  . buPROPion (WELLBUTRIN) 75 MG tablet Take 75 mg by mouth daily.      . Calcium Citrate-Vitamin D 250-200 MG-UNIT TABS Take by mouth. Take two tabs daily      . Cinnamon 500 MG capsule Take 1,000 mg by mouth daily.      . clopidogrel (PLAVIX) 75 MG tablet Take 1 tablet (75 mg total) by mouth daily with breakfast.  30 tablet  3  . Coenzyme Q10 (CO Q 10) 100 MG CAPS Take 1 tablet by mouth daily. Hold while in hospital      . denosumab (PROLIA) 60 MG/ML SOLN injection Inject 60 mg into the skin every 6 (six) months. Administer in  upper arm, thigh, or abdomen      . fenofibrate (TRICOR) 145 MG tablet Take 145 mg by mouth daily.      . fish oil-omega-3 fatty acids 1000 MG capsule Take 1 g by mouth daily.      . fluticasone (FLONASE) 50 MCG/ACT nasal spray Place 2 sprays into the nose 2 (two) times daily.       . Glucosamine-Chondroitin (OSTEO BI-FLEX REGULAR STRENGTH) 250-200 MG TABS Take 1 tablet by mouth daily. Hold while in hospital      . losartan (COZAAR) 50 MG tablet       . Multiple Vitamins-Minerals (CENTRUM SILVER PO) Take by mouth daily.      . pravastatin (PRAVACHOL) 80 MG tablet  Take 80 mg by mouth at bedtime.      . vitamin B-12 (CYANOCOBALAMIN) 1000 MCG tablet Take 1,000 mcg by mouth daily.      . Calcium Carbonate-Vitamin D (CALTRATE 600+D) 600-400 MG-UNIT per tablet Take 2 tablets by mouth daily.      . diclofenac sodium (VOLTAREN) 1 % GEL Apply 1 application topically 2 (two) times daily.      . Multiple Vitamin (MULITIVITAMIN WITH MINERALS) TABS Take 1 tablet by mouth daily.      Marland Kitchen omeprazole (PRILOSEC) 20 MG capsule Take 20 mg by mouth daily.      . potassium chloride SA (K-DUR,KLOR-CON) 20 MEQ tablet Take 20 mEq by mouth 2 (two) times daily.       No facility-administered medications prior to visit.   Marland Kitchen oxybutynin (DITROPAN-XL) 5 MG 24 hr tablet    Sig: Take 5 mg by mouth daily.  . pantoprazole (PROTONIX) 40 MG tablet    Sig: Take 40 mg by mouth daily.  Marland Kitchen aspirin 81 MG tablet    Sig: Take 81 mg by mouth daily.  Marland Kitchen guaiFENesin (MUCINEX) 600 MG 12 hr tablet    Sig: Take 1,200 mg by mouth daily.  Marland Kitchen acetaminophen (TYLENOL) 500 MG tablet    Sig: Take 500 mg by mouth as needed for pain.  . cetirizine (ZYRTEC) 10 MG tablet    Sig: Take 10 mg by mouth daily.    PAST MEDICAL HISTORY: Past Medical History  Diagnosis Date  . Ectopic pregnancy   . Diabetes mellitus without complication   . Stroke   . Hypertension   . Hyperlipidemia   . GERD (gastroesophageal reflux disease)   . Arthritis     PAST SURGICAL HISTORY: Past Surgical History  Procedure Laterality Date  . Abdominal hysterectomy    . Breast reduction surgery    . Laparoscopic salpingoopherectomy    . Appendectomy    . Nasal mass excision      Basal Cell Cancer Excision  . Nasal reconstruction    . Tee without cardioversion  10/22/2012    Procedure: TRANSESOPHAGEAL ECHOCARDIOGRAM (TEE);  Surgeon: Josue Hector, MD;  Location: [facility] ENDOSCOPY;  Service: Cardiovascular;  Laterality: N/A;    FAMILY HISTORY: Family History  Problem Relation Age of Onset  . Hypertension Father   .  Hypertension Mother   . Cancer - Lung Brother     SOCIAL HISTORY: History   Social History  . Marital Status: Single    Spouse Name: N/A    Number of Children: 2  . Years of Education: 12   Occupational History  . retired    Social History Main Topics  . Smoking status: Former Research scientist (life sciences)  . Smokeless tobacco: Never Used  . Alcohol Use:  No  . Drug Use: No  . Sexual Activity: No   Other Topics Concern  . Not on file   Social History Narrative  . No narrative on file     PHYSICAL EXAM  Filed Vitals:   08/17/13 1458  BP: 155/64  Pulse: 67  Temp: 97.6 F (36.4 C)  TempSrc: Oral  Height: 4\' 9"  (1.448 m)  Weight: 168 lb (76.204 kg)   Body mass index is 36.34 kg/(m^2).  General: well developed, well nourished, seated, in no evident distress  Head: head normocephalic and atraumatic. Orohparynx benign  Neck: supple with no carotid or supraclavicular bruits  Cardiovascular: regular rate and rhythm, no murmurs  Musculoskeletal mild kyphosis  Skin mild pedal edema. No rash.   Neurologic Exam  Mental Status: Awake and fully alert. Oriented to place and time. Recent and remote memory intact. Attention span, concentration and fund of knowledge appropriate. Mood and affect appropriate.  (Last visit Mini-Mental status exam score 28/30 with one deficit in orientation and recall. Animal fluency test 8. Clock drawing 4/4. Geriatric depression scale not depressed.) Cranial Nerves:  Pupils equal, briskly reactive to light. Extraocular movements full without nystagmus. Visual fields full to confrontation. Hearing intact and symmetric to finer snap. Facial sensation intact. Face, tongue, palate move normally and symmetrically. Neck flexion and extension normal.  Motor: Normal bulk and tone. Normal strength in all tested extremity muscles.  Diminished fine finger movements on the left. Orbits right over left upper extremity.  Sensory.: intact to tough and pinprick and vibratory.    Coordination: Rapid alternating movements normal in all extremities. Finger-to-nose and heel-to-shin performed accurately bilaterally.  Gait and Station: Arises from chair without difficulty. Stance is normal. Gait demonstrates normal stride length and balance. Slow and careful gait.  Not able to heel, toe and tandem walk without difficulty. Uses a cane.  Reflexes: 1+ and symmetric. Toes downgoing.    DIAGNOSTIC DATA (LABS, IMAGING, TESTING) - I reviewed patient records, labs, notes, testing and imaging myself where available.  10/13/12 CAROTID DOPPLER - No significant extracranial carotid artery stenosis demonstrated. Higher velocities in the 40-59% range in the the left mid ICA areprobably due to vessel tortuosity. ICA/CCA ratio is 0.63 on the right and 1.47 on the left.  Vertebrals are patent with antegrade flow.  ASSESSMENT AND PLAN 79 year old lady with left hemispheric TIA in December 2013 and right MCA branch infarcts likely of embolic etiology without definite identified source of embolism. Vascular risk factors of hypertension and hyperlipidemia.  Mild age-appropriate cognitive impairment.  PLAN:  Continue Plavix for secondary stroke prevention with strict control of hypertension with blood pressure goal below 130/90 and lipids with LDL cholesterol goal below 100 mg percent. I had a long discussion with the patient and her friend and advised her to be compliant with her medications and risk factor control. I also encouraged her to be physically active and eat healthy diet and lose weight.  We will check repeat carotid doppler study in 6 months. She was advised to return for followup in 6 months or call earlier if necessary.  Philmore Pali, MSN, NP-C 08/17/2013, 3:23 PM [facility] [address], [place], [place] [address] [phone]

## 2013-08-17 NOTE — Patient Instructions (Signed)
Continue Plavix for secondary stroke prevention with strict control of hypertension with blood pressure goal below 130/90 and lipids with LDL cholesterol goal below 100 mg percent. I had a long discussion with the patient and her friend and advised her to be compliant with her medications and risk factor control.   We will check repeat carotid doppler study in 6 months.  She was advised to return for followup in 6 months or call earlier if necessary.

## 2013-11-15 DIAGNOSIS — R05 Cough: Secondary | ICD-10-CM | POA: Diagnosis not present

## 2013-11-15 DIAGNOSIS — R059 Cough, unspecified: Secondary | ICD-10-CM | POA: Diagnosis not present

## 2013-11-15 DIAGNOSIS — M79609 Pain in unspecified limb: Secondary | ICD-10-CM | POA: Diagnosis not present

## 2013-11-15 DIAGNOSIS — Z87891 Personal history of nicotine dependence: Secondary | ICD-10-CM | POA: Diagnosis not present

## 2013-11-15 DIAGNOSIS — N318 Other neuromuscular dysfunction of bladder: Secondary | ICD-10-CM | POA: Diagnosis not present

## 2014-01-05 DIAGNOSIS — M19049 Primary osteoarthritis, unspecified hand: Secondary | ICD-10-CM | POA: Diagnosis not present

## 2014-01-21 DIAGNOSIS — Z6838 Body mass index (BMI) 38.0-38.9, adult: Secondary | ICD-10-CM | POA: Diagnosis not present

## 2014-01-21 DIAGNOSIS — Z87891 Personal history of nicotine dependence: Secondary | ICD-10-CM | POA: Diagnosis not present

## 2014-01-21 DIAGNOSIS — S81009A Unspecified open wound, unspecified knee, initial encounter: Secondary | ICD-10-CM | POA: Diagnosis not present

## 2014-01-21 DIAGNOSIS — S81809A Unspecified open wound, unspecified lower leg, initial encounter: Secondary | ICD-10-CM | POA: Diagnosis not present

## 2014-01-21 DIAGNOSIS — R609 Edema, unspecified: Secondary | ICD-10-CM | POA: Diagnosis not present

## 2014-01-24 DIAGNOSIS — Z6837 Body mass index (BMI) 37.0-37.9, adult: Secondary | ICD-10-CM | POA: Diagnosis not present

## 2014-01-24 DIAGNOSIS — S81809A Unspecified open wound, unspecified lower leg, initial encounter: Secondary | ICD-10-CM | POA: Diagnosis not present

## 2014-01-24 DIAGNOSIS — Z87891 Personal history of nicotine dependence: Secondary | ICD-10-CM | POA: Diagnosis not present

## 2014-01-24 DIAGNOSIS — S81009A Unspecified open wound, unspecified knee, initial encounter: Secondary | ICD-10-CM | POA: Diagnosis not present

## 2014-01-24 DIAGNOSIS — R609 Edema, unspecified: Secondary | ICD-10-CM | POA: Diagnosis not present

## 2014-02-07 DIAGNOSIS — R05 Cough: Secondary | ICD-10-CM | POA: Diagnosis not present

## 2014-02-07 DIAGNOSIS — R059 Cough, unspecified: Secondary | ICD-10-CM | POA: Diagnosis not present

## 2014-02-07 DIAGNOSIS — R0982 Postnasal drip: Secondary | ICD-10-CM | POA: Diagnosis not present

## 2014-02-07 DIAGNOSIS — J387 Other diseases of larynx: Secondary | ICD-10-CM | POA: Diagnosis not present

## 2014-02-16 ENCOUNTER — Encounter (INDEPENDENT_AMBULATORY_CARE_PROVIDER_SITE_OTHER): Payer: Self-pay

## 2014-02-16 ENCOUNTER — Ambulatory Visit (INDEPENDENT_AMBULATORY_CARE_PROVIDER_SITE_OTHER): Payer: Medicare Other | Admitting: Nurse Practitioner

## 2014-02-16 ENCOUNTER — Encounter: Payer: Self-pay | Admitting: Nurse Practitioner

## 2014-02-16 VITALS — BP 121/54 | HR 63 | Ht <= 58 in | Wt 171.0 lb

## 2014-02-16 DIAGNOSIS — G459 Transient cerebral ischemic attack, unspecified: Secondary | ICD-10-CM

## 2014-02-16 NOTE — Patient Instructions (Addendum)
Continue Plavix for secondary stroke prevention with strict control of hypertension with blood pressure goal below 140/90 and lipids with LDL cholesterol goal below 100 mg percent.  We will check repeat carotid doppler study.  Someone will call you to schedule this appointment. Return for followup in 6 months or call earlier if necessary.

## 2014-02-16 NOTE — Progress Notes (Signed)
PATIENT: Charlene Wade DOB: Aug 08, 1928  REASON FOR VISIT: routine stroke follow up HISTORY FROM: patient  HISTORY OF PRESENT ILLNESS: 01/26/13 (PS): Charlene Wade is 80 year old Caucasian lady seen for followup today following hospital admission in December 2013 for TIA. She was admitted with hypertensive emergency and had transient slurred speech and right-sided weakness. CT head was negative for acute infarct and showed only white matter changes. Transthoracic echo showed normal ejection fraction. Transesophageal echocardiogram done subsequently as an outpatient showed no evidence of cardiac source of embolism or patent foramen ovale. Carotid Doppler showed no significant extracranial stenosis. Hb A1c was 5. Lipid profile was normal. Patient had previously had right temporal MCA branch infarct in August 2013 and was admitted to [facility] that time. She was placed on aspirin which was subsequently changed to Aggrenox by her primary physician. She however for some reason had stopped taking Aggrenox when she was admitted in December. Patient states she's done well on the Plavix and has not had any bleeding though she has minor bruising. She states her blood pressure is under good control and today it is 135/62 in office. Her lipid profile last checked in December was fine. She states that she's lost some weight and plans to be more physically active. She uses a walker mostly but uses a cane for short distances. She states she has recovered physically quite well from her previous stroke in August and has no new neurological complaints.   UPDATE 08/17/13 (LL): Charlene Wade comes in for stroke 6 month revisit. She states she is doing well, BP is controlled. She reports no falls. She does not know when the last time she had blood work was at her PCP. She is tolerating Plavix and baby aspirin well with no signs of excessive bruising. She states she has just finished another round of  physical therapy.   UPDATE 02/16/14 (LL): Charlene Wade comes in for stroke 6 month revisit.  She is tolerating Plavix and baby aspirin well with no signs of excessive bruising. Her MMSE is 28/30, unchanged from last visit with a GDS of 2 only.  She states her blood pressure is well controlled, it is 124/54 in the office today.  She has runny nose and post-nasal drip with cough that she states has been going on for a year; she was referred to ENT Dr. Constance Holster.  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  She is being treated with allergy meds.  She denies any falls.  ROS:  14 system review of systems is positive for fatigue, weight gain, swelling in the legs, hearing loss, shortness of breath, cough, wheezing, snoring, incontinence, joint pain, cramps, feeling cold   ALLERGIES: Allergies  Allergen Reactions  . Ace Inhibitors     unknown  . Codeine     unknown  . Levaquin [Levofloxacin]     unknown  . Penicillins     unknown  . Sulfa Antibiotics     unknown    HOME MEDICATIONS: Outpatient Prescriptions Prior to Visit  Medication Sig Dispense Refill  . acetaminophen (TYLENOL) 500 MG tablet Take 500 mg by mouth as needed for pain.      Marland Kitchen amLODipine (NORVASC) 5 MG tablet Take 2 tablets (10 mg total) by mouth daily.  60 tablet  3  . aspirin 81 MG tablet Take 81 mg by mouth daily.      Marland Kitchen buPROPion (WELLBUTRIN) 75 MG tablet Take 75 mg by mouth daily.      Marland Kitchen  Calcium Citrate-Vitamin D 250-200 MG-UNIT TABS Take by mouth. Take two tabs daily      . cetirizine (ZYRTEC) 10 MG tablet Take 10 mg by mouth daily.      . Cinnamon 500 MG capsule Take 1,000 mg by mouth daily.      . clopidogrel (PLAVIX) 75 MG tablet Take 1 tablet (75 mg total) by mouth daily with breakfast.  30 tablet  3  . Coenzyme Q10 (CO Q 10) 100 MG CAPS Take 1 tablet by mouth daily. Hold while in hospital      . denosumab (PROLIA) 60 MG/ML SOLN injection Inject 60 mg into the skin every 6 (six) months. Administer in upper  arm, thigh, or abdomen      . fenofibrate (TRICOR) 145 MG tablet Take 145 mg by mouth daily.      . fish oil-omega-3 fatty acids 1000 MG capsule Take 1 g by mouth daily.      . fluticasone (FLONASE) 50 MCG/ACT nasal spray Place 2 sprays into the nose 2 (two) times daily.       . Glucosamine-Chondroitin (OSTEO BI-FLEX REGULAR STRENGTH) 250-200 MG TABS Take 1 tablet by mouth daily. Hold while in hospital      . losartan (COZAAR) 50 MG tablet       . Multiple Vitamins-Minerals (CENTRUM SILVER PO) Take by mouth daily.      Marland Kitchen oxybutynin (DITROPAN-XL) 5 MG 24 hr tablet Take 5 mg by mouth daily.      . pantoprazole (PROTONIX) 40 MG tablet Take 40 mg by mouth daily.      . pravastatin (PRAVACHOL) 80 MG tablet Take 80 mg by mouth at bedtime.      . vitamin B-12 (CYANOCOBALAMIN) 1000 MCG tablet Take 1,000 mcg by mouth daily.      Marland Kitchen albuterol (PROVENTIL HFA;VENTOLIN HFA) 108 (90 BASE) MCG/ACT inhaler Inhale 1-2 puffs into the lungs every 6 (six) hours as needed. For shortness of breath and wheezing      . guaiFENesin (MUCINEX) 600 MG 12 hr tablet Take 1,200 mg by mouth daily.       No facility-administered medications prior to visit.     PHYSICAL EXAM  Filed Vitals:   02/16/14 1515  BP: 121/54  Pulse: 63  Height: 4\' 9"  (1.448 m)  Weight: 171 lb (77.565 kg)   Body mass index is 36.99 kg/(m^2).  General: well developed, well nourished, seated, in no evident distress  Head: head normocephalic and atraumatic. Orohparynx benign  Neck: supple with no carotid or supraclavicular bruits  Cardiovascular: regular rate and rhythm, no murmurs  Musculoskeletal mild kyphosis  Skin mild pedal edema. No rash.   Neurologic Exam  Mental Status: Awake and fully alert. Oriented to place and time. Recent and remote memory intact. Attention span, concentration and fund of knowledge appropriate. Mood and affect appropriate. MMSE todat is 28/30. GDS 2. (Last visit Mini-Mental status exam score 28/30 with one  deficit in orientation and recall. Animal fluency test 8. Clock drawing 4/4. Geriatric depression scale not depressed.)  Cranial Nerves: Pupils equal, briskly reactive to light. Extraocular movements full without nystagmus. Visual fields full to confrontation. Hearing intact and symmetric to finer snap. Facial sensation intact. Face, tongue, palate move normally and symmetrically. Neck flexion and extension normal.  Motor: Normal bulk and tone. Normal strength in all tested extremity muscles. Diminished fine finger movements on the left. Orbits right over left upper extremity.  Sensory: intact to touch and pinprick and vibratory.  Coordination: Rapid  alternating movements normal in all extremities. Finger-to-nose and heel-to-shin performed accurately bilaterally.  Gait and Station: Arises from chair without difficulty. Stance is normal. Gait demonstrates normal stride length and balance. Slow and careful gait. Not able to heel, toe and tandem walk without difficulty. Uses a cane.  Reflexes: 1+ and symmetric. Toes downgoing.   10/13/12 CAROTID DOPPLER - No significant extracranial carotid artery stenosis demonstrated. Higher velocities in the 40-59% range in the the left mid ICA areprobably due to vessel tortuosity. ICA/CCA ratio is 0.63 on the right and 1.47 on the left. Vertebrals are patent with antegrade flow.  ASSESSMENT AND PLAN 80 year old lady with left hemispheric TIA in December 2013 and right MCA branch infarcts likely of embolic etiology without definite identified source of embolism. Vascular risk factors of hypertension and hyperlipidemia. Mild age-appropriate cognitive impairment.   PLAN:  Continue Plavix for secondary stroke prevention with strict control of hypertension with blood pressure goal below 140/90 and lipids with LDL cholesterol goal below 100 mg percent. I had a long discussion with the patient and her friend and advised her to be compliant with her medications and risk  factor control. I also encouraged her to be physically active and eat healthy diet and lose weight.  We will check repeat carotid doppler study. She was advised to return for followup in 6 months or call earlier if necessary.  Orders Placed This Encounter  Procedures  . US Carotid Bilateral   Return in about 6 months (around 08/18/2014).  Philmore Pali, MSN, NP-C 02/16/2014, 4:15 PM [facility] [address], [place], [place] [address] [phone]  Note: This document was prepared with digital dictation and possible smart phrase technology. Any transcriptional errors that result from this process are unintentional.

## 2014-03-04 ENCOUNTER — Ambulatory Visit (INDEPENDENT_AMBULATORY_CARE_PROVIDER_SITE_OTHER): Payer: Medicare Other

## 2014-03-04 DIAGNOSIS — G459 Transient cerebral ischemic attack, unspecified: Secondary | ICD-10-CM | POA: Diagnosis not present

## 2014-03-15 DIAGNOSIS — R609 Edema, unspecified: Secondary | ICD-10-CM | POA: Diagnosis not present

## 2014-03-15 DIAGNOSIS — N189 Chronic kidney disease, unspecified: Secondary | ICD-10-CM | POA: Diagnosis not present

## 2014-03-15 DIAGNOSIS — D352 Benign neoplasm of pituitary gland: Secondary | ICD-10-CM | POA: Insufficient documentation

## 2014-03-15 DIAGNOSIS — I10 Essential (primary) hypertension: Secondary | ICD-10-CM | POA: Diagnosis not present

## 2014-03-16 ENCOUNTER — Telehealth: Payer: Self-pay | Admitting: *Deleted

## 2014-03-16 NOTE — Telephone Encounter (Signed)
Called patient and gave normal results to patient.

## 2014-04-05 ENCOUNTER — Encounter: Payer: Self-pay | Admitting: Neurology

## 2014-04-19 DIAGNOSIS — D239 Other benign neoplasm of skin, unspecified: Secondary | ICD-10-CM | POA: Diagnosis not present

## 2014-04-19 DIAGNOSIS — Z85828 Personal history of other malignant neoplasm of skin: Secondary | ICD-10-CM | POA: Diagnosis not present

## 2014-04-19 DIAGNOSIS — L820 Inflamed seborrheic keratosis: Secondary | ICD-10-CM | POA: Diagnosis not present

## 2014-04-19 DIAGNOSIS — L821 Other seborrheic keratosis: Secondary | ICD-10-CM | POA: Diagnosis not present

## 2014-04-20 DIAGNOSIS — H353190 Nonexudative age-related macular degeneration, unspecified eye, stage unspecified: Secondary | ICD-10-CM | POA: Diagnosis not present

## 2014-04-20 DIAGNOSIS — H04129 Dry eye syndrome of unspecified lacrimal gland: Secondary | ICD-10-CM | POA: Diagnosis not present

## 2014-06-20 DIAGNOSIS — J387 Other diseases of larynx: Secondary | ICD-10-CM | POA: Diagnosis not present

## 2014-07-01 DIAGNOSIS — J309 Allergic rhinitis, unspecified: Secondary | ICD-10-CM | POA: Diagnosis not present

## 2014-07-01 DIAGNOSIS — J3489 Other specified disorders of nose and nasal sinuses: Secondary | ICD-10-CM | POA: Diagnosis not present

## 2014-07-19 DIAGNOSIS — E785 Hyperlipidemia, unspecified: Secondary | ICD-10-CM | POA: Diagnosis not present

## 2014-07-26 DIAGNOSIS — J370 Chronic laryngitis: Secondary | ICD-10-CM | POA: Diagnosis not present

## 2014-07-26 DIAGNOSIS — J309 Allergic rhinitis, unspecified: Secondary | ICD-10-CM | POA: Diagnosis not present

## 2014-07-26 DIAGNOSIS — R05 Cough: Secondary | ICD-10-CM | POA: Diagnosis not present

## 2014-07-26 DIAGNOSIS — R059 Cough, unspecified: Secondary | ICD-10-CM | POA: Diagnosis not present

## 2014-08-23 ENCOUNTER — Ambulatory Visit: Payer: Medicare Other | Admitting: Neurology

## 2014-08-23 ENCOUNTER — Encounter: Payer: Self-pay | Admitting: Neurology

## 2014-08-23 ENCOUNTER — Ambulatory Visit (INDEPENDENT_AMBULATORY_CARE_PROVIDER_SITE_OTHER): Payer: Medicare Other | Admitting: Neurology

## 2014-08-23 VITALS — BP 145/65 | HR 60 | Ht <= 58 in | Wt 173.0 lb

## 2014-08-23 DIAGNOSIS — G3184 Mild cognitive impairment, so stated: Secondary | ICD-10-CM

## 2014-08-23 DIAGNOSIS — J309 Allergic rhinitis, unspecified: Secondary | ICD-10-CM | POA: Diagnosis not present

## 2014-08-23 DIAGNOSIS — J370 Chronic laryngitis: Secondary | ICD-10-CM | POA: Diagnosis not present

## 2014-08-23 DIAGNOSIS — J4540 Moderate persistent asthma, uncomplicated: Secondary | ICD-10-CM | POA: Diagnosis not present

## 2014-08-23 NOTE — Patient Instructions (Signed)
I had a long discussion with the patient and her daughter regarding the TIA, stroke risk factors, risk factor modification and answered questions. Continue Plavix for stroke prevention but discontinue aspirin as I see no reason for dual antiplatelet therapy. Maintain strict control of hypertension with blood pressure goal below 130/90, lipids with LDL cholesterol goal below 100 mg percent. Return for followup in 6 months or  call earlier if necessary.

## 2014-08-23 NOTE — Progress Notes (Signed)
PATIENT: Charlene Wade DOB: 09/03/1928  REASON FOR VISIT: routine stroke follow up HISTORY FROM: patient  HISTORY OF PRESENT ILLNESS: 01/26/13 (PS): [name] is 80 year old Caucasian lady seen for followup today following hospital admission in December 2013 for TIA. She was admitted with hypertensive emergency and had transient slurred speech and right-sided weakness. CT head was negative for acute infarct and showed only white matter changes. Transthoracic echo showed normal ejection fraction. Transesophageal echocardiogram done subsequently as an outpatient showed no evidence of cardiac source of embolism or patent foramen ovale. Carotid Doppler showed no significant extracranial stenosis. Hb A1c was 5. Lipid profile was normal. Patient had previously had right temporal MCA branch infarct in August 2013 and was admitted to [facility] that time. She was placed on aspirin which was subsequently changed to Aggrenox by her primary physician. She however for some reason had stopped taking Aggrenox when she was admitted in December. Patient states she's done well on the Plavix and has not had any bleeding though she has minor bruising. She states her blood pressure is under good control and today it is 135/62 in office. Her lipid profile last checked in December was fine. She states that she's lost some weight and plans to be more physically active. She uses a walker mostly but uses a cane for short distances. She states she has recovered physically quite well from her previous stroke in August and has no new neurological complaints.   UPDATE 08/17/13 (LL): [name] comes in for stroke 6 month revisit. She states she is doing well, BP is controlled. She reports no falls. She does not know when the last time she had blood work was at her PCP. She is tolerating Plavix and baby aspirin well with no signs of excessive bruising. She states she has just finished another round of  physical therapy.   UPDATE 02/16/14 (LL): [name] comes in for stroke 6 month revisit.  She is tolerating Plavix and baby aspirin well with no signs of excessive bruising. Her MMSE is 28/30, unchanged from last visit with a GDS of 2 only.  She states her blood pressure is well controlled, it is 124/54 in the office today.  She has runny nose and post-nasal drip with cough that she states has been going on for a year; she was referred to ENT Dr. Constance Holster.  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  She is being treated with allergy meds.  She denies any falls. UPDATE 08/24/2014 : She is doing well without any TIA or stroke symptoms. She remains on plavix and aspirin and tolerating it well. She had lipids checked by primary MD 4 weeks ago which were fine but I do not have the results.She had f/u carotid ultrasound which I personally reviewed and shows no significant extracranial stenosis.She has persistent mucus in her throat and is seeing a allergist for that.She has no new complaints.MMSE today  Which is 28/30 unchanged from last visit ROS:  14 system review of systems is positive for fatigue, weight gain, swelling in the legs, hearing loss, shortness of breath, cough, wheezing, shortness of breath, feeling cold ,joint pain,frequent walking,daytime sleepiness,muscle cramps, frequency of urination and skin moles  ALLERGIES: Allergies  Allergen Reactions  . Ace Inhibitors     unknown  . Amlodipine   . Codeine     unknown  . Levaquin [Levofloxacin]     unknown  . Penicillins     unknown  .  Sulfa Antibiotics     unknown    HOME MEDICATIONS: Outpatient Prescriptions Prior to Visit  Medication Sig Dispense Refill  . acetaminophen (TYLENOL) 500 MG tablet Take 500 mg by mouth as needed for pain.      Marland Kitchen albuterol (PROVENTIL HFA;VENTOLIN HFA) 108 (90 BASE) MCG/ACT inhaler Inhale 1-2 puffs into the lungs every 6 (six) hours as needed. For shortness of breath and wheezing      .  amLODipine (NORVASC) 5 MG tablet Take 2 tablets (10 mg total) by mouth daily.  60 tablet  3  . aspirin 81 MG tablet Take 81 mg by mouth daily.      Marland Kitchen buPROPion (WELLBUTRIN) 75 MG tablet Take 75 mg by mouth daily.      . Calcium Citrate-Vitamin D 250-200 MG-UNIT TABS Take by mouth. Take two tabs daily      . cetirizine (ZYRTEC) 10 MG tablet Take 10 mg by mouth daily.      . Cinnamon 500 MG capsule Take 1,000 mg by mouth daily.      . clopidogrel (PLAVIX) 75 MG tablet Take 1 tablet (75 mg total) by mouth daily with breakfast.  30 tablet  3  . Coenzyme Q10 (CO Q 10) 100 MG CAPS Take 1 tablet by mouth daily. Hold while in hospital      . fenofibrate (TRICOR) 145 MG tablet Take 145 mg by mouth daily.      . fish oil-omega-3 fatty acids 1000 MG capsule Take 1 g by mouth daily.      . fluticasone (FLONASE) 50 MCG/ACT nasal spray Place 2 sprays into the nose 2 (two) times daily.       . Glucosamine-Chondroitin (OSTEO BI-FLEX REGULAR STRENGTH) 250-200 MG TABS Take 1 tablet by mouth daily. Hold while in hospital      . losartan (COZAAR) 50 MG tablet       . Multiple Vitamins-Minerals (CENTRUM SILVER PO) Take by mouth daily.      Marland Kitchen oxybutynin (DITROPAN-XL) 5 MG 24 hr tablet Take 5 mg by mouth daily.      . pantoprazole (PROTONIX) 40 MG tablet Take 40 mg by mouth daily.      . pravastatin (PRAVACHOL) 80 MG tablet Take 80 mg by mouth at bedtime.      . vitamin B-12 (CYANOCOBALAMIN) 1000 MCG tablet Take 1,000 mcg by mouth daily.      Marland Kitchen guaiFENesin (MUCINEX) 600 MG 12 hr tablet Take 1,200 mg by mouth daily.      Marland Kitchen denosumab (PROLIA) 60 MG/ML SOLN injection Inject 60 mg into the skin every 6 (six) months. Administer in upper arm, thigh, or abdomen       No facility-administered medications prior to visit.     PHYSICAL EXAM  Filed Vitals:   08/23/14 1550  BP: 145/65  Pulse: 60  Height: 4\' 9"  (1.448 m)  Weight: 173 lb (78.472 kg)   Body mass index is 37.43 kg/(m^2).  General: well developed, well  nourished, seated, in no evident distress  Head: head normocephalic and atraumatic. Orohparynx benign  Neck: supple with no carotid or supraclavicular bruits  Cardiovascular: regular rate and rhythm, no murmurs  Musculoskeletal mild kyphosis  Skin mild pedal edema. No rash.   Neurologic Exam  Mental Status: Awake and fully alert. Oriented to place and time. Recent and remote memory intact. Attention span, concentration and fund of knowledge appropriate. Mood and affect appropriate. MMSE todat is 28/30. GDS 5. (Last visit Mini-Mental status exam score 28/30  with one deficit in orientation and recall. Animal fluency test 8. Clock drawing 4/4. Geriatric depression scale 2)  Cranial Nerves: Pupils equal, briskly reactive to light. Extraocular movements full without nystagmus. Visual fields full to confrontation. Hearing intact and symmetric to finer snap. Facial sensation intact. Face, tongue, palate move normally and symmetrically. Neck flexion and extension normal.  Motor: Normal bulk and tone. Normal strength in all tested extremity muscles. Diminished fine finger movements on the left. Orbits right over left upper extremity.  Sensory: intact to touch and pinprick and vibratory.  Coordination: Rapid alternating movements normal in all extremities. Finger-to-nose and heel-to-shin performed accurately bilaterally.  Gait and Station: Arises from chair without difficulty. Stance is normal. Gait demonstrates normal stride length and balance. Slow and careful gait. Not able to heel, toe and tandem walk without difficulty. Uses a cane.  Reflexes: 1+ and symmetric. Toes downgoing.   10/13/12 CAROTID DOPPLER - No significant extracranial carotid artery stenosis demonstrated. Higher velocities in the 40-59% range in the the left mid ICA areprobably due to vessel tortuosity. ICA/CCA ratio is 0.63 on the right and 1.47 on the left. Vertebrals are patent with antegrade flow. CUS 03/04/14 : no significant  extracranial stenosis  ASSESSMENT AND PLAN 80 year old lady with left hemispheric TIA in December 2013 and right MCA branch infarcts likely of embolic etiology without definite identified source of embolism. Vascular risk factors of hypertension and hyperlipidemia. Mild age-appropriate cognitive impairment.   PLAN:  I had a long discussion with the patient and her daughter regarding the TIA, stroke risk factors, risk factor modification and answered questions. Continue Plavix for stroke prevention but discontinue aspirin as I see no reason for dual antiplatelet therapy. Maintain strict control of hypertension with blood pressure goal below 130/90, lipids with LDL cholesterol goal below 100 mg percent. Return for followup in 6 months or  call earlier if necessary.  No orders of the defined types were placed in this encounter.   Return in about 6 months (around 02/22/2015).  Antony Contras, MD  08/23/2014, 6:22 PM [facility] [address] [place], [place] [address] [phone]  Note: This document was prepared with digital dictation and possible smart phrase technology. Any transcriptional errors that result from this process are unintentional.

## 2014-09-06 DIAGNOSIS — M810 Age-related osteoporosis without current pathological fracture: Secondary | ICD-10-CM | POA: Diagnosis not present

## 2014-09-06 DIAGNOSIS — I10 Essential (primary) hypertension: Secondary | ICD-10-CM | POA: Diagnosis not present

## 2014-09-06 DIAGNOSIS — Z23 Encounter for immunization: Secondary | ICD-10-CM | POA: Diagnosis not present

## 2014-09-09 DIAGNOSIS — Z23 Encounter for immunization: Secondary | ICD-10-CM | POA: Diagnosis not present

## 2014-09-14 DIAGNOSIS — M810 Age-related osteoporosis without current pathological fracture: Secondary | ICD-10-CM | POA: Diagnosis not present

## 2014-10-25 DIAGNOSIS — J370 Chronic laryngitis: Secondary | ICD-10-CM | POA: Diagnosis not present

## 2014-10-25 DIAGNOSIS — R05 Cough: Secondary | ICD-10-CM | POA: Diagnosis not present

## 2015-01-06 DIAGNOSIS — I10 Essential (primary) hypertension: Secondary | ICD-10-CM | POA: Diagnosis not present

## 2015-01-06 DIAGNOSIS — Z6831 Body mass index (BMI) 31.0-31.9, adult: Secondary | ICD-10-CM | POA: Diagnosis not present

## 2015-01-24 DIAGNOSIS — J4540 Moderate persistent asthma, uncomplicated: Secondary | ICD-10-CM | POA: Diagnosis not present

## 2015-01-24 DIAGNOSIS — J309 Allergic rhinitis, unspecified: Secondary | ICD-10-CM | POA: Diagnosis not present

## 2015-01-25 ENCOUNTER — Ambulatory Visit
Admission: RE | Admit: 2015-01-25 | Discharge: 2015-01-25 | Disposition: A | Discharge status: Home / Self Care | Payer: Medicare Other | Source: Ambulatory Visit | Attending: Allergy and Immunology | Admitting: Allergy and Immunology

## 2015-01-25 ENCOUNTER — Other Ambulatory Visit: Payer: Self-pay | Admitting: Allergy and Immunology

## 2015-01-25 DIAGNOSIS — R05 Cough: Secondary | ICD-10-CM | POA: Diagnosis not present

## 2015-01-25 DIAGNOSIS — R0602 Shortness of breath: Secondary | ICD-10-CM | POA: Diagnosis not present

## 2015-01-25 DIAGNOSIS — R059 Cough, unspecified: Secondary | ICD-10-CM

## 2015-03-03 DIAGNOSIS — M810 Age-related osteoporosis without current pathological fracture: Secondary | ICD-10-CM | POA: Diagnosis not present

## 2015-03-07 ENCOUNTER — Encounter: Payer: Self-pay | Admitting: Neurology

## 2015-03-07 ENCOUNTER — Ambulatory Visit (INDEPENDENT_AMBULATORY_CARE_PROVIDER_SITE_OTHER): Payer: Medicare Other | Admitting: Neurology

## 2015-03-07 VITALS — BP 162/59 | HR 61 | Wt 172.8 lb

## 2015-03-07 DIAGNOSIS — K219 Gastro-esophageal reflux disease without esophagitis: Secondary | ICD-10-CM

## 2015-03-07 NOTE — Progress Notes (Signed)
PATIENT: Charlene Wade DOB: 05-Jan-1928  REASON FOR VISIT: routine stroke follow up HISTORY FROM: patient  HISTORY OF PRESENT ILLNESS: 01/26/13 (PS): [name] is 81 year old Caucasian lady seen for followup today following hospital admission in December 2013 for TIA. She was admitted with hypertensive emergency and had transient slurred speech and right-sided weakness. CT head was negative for acute infarct and showed only white matter changes. Transthoracic echo showed normal ejection fraction. Transesophageal echocardiogram done subsequently as an outpatient showed no evidence of cardiac source of embolism or patent foramen ovale. Carotid Doppler showed no significant extracranial stenosis. Hb A1c was 5. Lipid profile was normal. Patient had previously had right temporal MCA branch infarct in August 2013 and was admitted to [facility] that time. She was placed on aspirin which was subsequently changed to Aggrenox by her primary physician. She however for some reason had stopped taking Aggrenox when she was admitted in December. Patient states she's done well on the Plavix and has not had any bleeding though she has minor bruising. She states her blood pressure is under good control and today it is 135/62 in office. Her lipid profile last checked in December was fine. She states that she's lost some weight and plans to be more physically active. She uses a walker mostly but uses a cane for short distances. She states she has recovered physically quite well from her previous stroke in August and has no new neurological complaints.   UPDATE 08/17/13 (LL): Charlene Wade comes in for stroke 6 month revisit. She states she is doing well, BP is controlled. She reports no falls. She does not know when the last time she had blood work was at her PCP. She is tolerating Plavix and baby aspirin well with no signs of excessive bruising. She states she has just finished another round of  physical therapy.   UPDATE 02/16/14 (LL): Charlene Wade comes in for stroke 6 month revisit.  She is tolerating Plavix and baby aspirin well with no signs of excessive bruising. Her MMSE is 28/30, unchanged from last visit with a GDS of 2 only.  She states her blood pressure is well controlled, it is 124/54 in the office today.  She has runny nose and post-nasal drip with cough that she states has been going on for a year; she was referred to ENT Dr. Constance Holster.  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  She is being treated with allergy meds.  She denies any falls. UPDATE 08/24/2014 : She is doing well without any TIA or stroke symptoms. She remains on plavix and aspirin and tolerating it well. She had lipids checked by primary MD 4 weeks ago which were fine but I do not have the results.She had f/u carotid ultrasound which I personally reviewed and shows no significant extracranial stenosis.She has persistent mucus in her throat and is seeing a allergist for that.She has no new complaints.MMSE today  Which is 28/30 unchanged from last visit Update 03/07/2015 : She returns for follow-up after last visit 6 months ago. She is accompanied by a caregiver friend. She continues to have mucus or irritation in her throat and has seen Dr. Constance Holster from ENT as well as a allergist and was started on Zyrtec and a pain medication which helped somewhat. She did discontinue aspirin and remains on Plavix alone. She continues to have decrease in short-term memory and gets confused off and on she lives alone at home but her friend  visits 6 days per week for 8 hours. She is not driving. She walks with a 4 pronged cane and has not had any falls. There have been no safety concerns. She wants a referral to the gastroenterologist for evaluation for gastroesophageal reflux disease ROS:  14 system review of systems is positive for  hearing loss, eye discharge, itching, cough, wheezing, shortness of breath, leg swelling, cold  intolerance, excessive thirst, incontinence of bladder, urgency, joint and back pain, walking difficulty, apnea, frequent warm waking, daytime sleepiness, snoring, memory loss, numbness, weakness, confusion and decrease concentration ALLERGIES: Allergies  Allergen Reactions  . Ace Inhibitors     unknown  . Amlodipine   . Codeine     unknown  . Levaquin [Levofloxacin]     unknown  . Penicillins     unknown  . Sulfa Antibiotics     unknown    HOME MEDICATIONS: Outpatient Prescriptions Prior to Visit  Medication Sig Dispense Refill  . acetaminophen (TYLENOL) 500 MG tablet Take 500 mg by mouth as needed for pain.    Marland Kitchen albuterol (PROVENTIL HFA;VENTOLIN HFA) 108 (90 BASE) MCG/ACT inhaler Inhale 1-2 puffs into the lungs every 6 (six) hours as needed. For shortness of breath and wheezing    . amLODipine (NORVASC) 5 MG tablet Take 2 tablets (10 mg total) by mouth daily. 60 tablet 3  . buPROPion (WELLBUTRIN) 75 MG tablet Take 75 mg by mouth daily.    . Calcium Citrate-Vitamin D 250-200 MG-UNIT TABS Take by mouth. Take two tabs daily    . cetirizine (ZYRTEC) 10 MG tablet Take 10 mg by mouth daily.    . Cinnamon 500 MG capsule Take 1,000 mg by mouth daily.    . clopidogrel (PLAVIX) 75 MG tablet Take 1 tablet (75 mg total) by mouth daily with breakfast. 30 tablet 3  . Coenzyme Q10 (CO Q 10) 100 MG CAPS Take 1 tablet by mouth daily. Hold while in hospital    . denosumab (PROLIA) 60 MG/ML SOLN injection Inject 60 mg into the skin every 6 (six) months. Administer in upper arm, thigh, or abdomen    . fenofibrate (TRICOR) 145 MG tablet Take 145 mg by mouth daily.    . fish oil-omega-3 fatty acids 1000 MG capsule Take 1 g by mouth daily.    . fluticasone (FLONASE) 50 MCG/ACT nasal spray Place 2 sprays into the nose 2 (two) times daily.     . furosemide (LASIX) 20 MG tablet Take 20 mg by mouth as needed.    . Glucosamine-Chondroitin (OSTEO BI-FLEX REGULAR STRENGTH) 250-200 MG TABS Take 1 tablet by  mouth daily. Hold while in hospital    . losartan (COZAAR) 50 MG tablet     . Multiple Vitamins-Minerals (CENTRUM SILVER PO) Take by mouth daily.    Marland Kitchen oxybutynin (DITROPAN-XL) 5 MG 24 hr tablet Take 5 mg by mouth daily.    . pantoprazole (PROTONIX) 40 MG tablet Take 40 mg by mouth daily.    . potassium chloride (K-DUR,KLOR-CON) 10 MEQ tablet Take 10 mEq by mouth daily.    . ranitidine (ZANTAC) 300 MG tablet     . vitamin B-12 (CYANOCOBALAMIN) 1000 MCG tablet Take 1,000 mcg by mouth daily.    Marland Kitchen aspirin 81 MG tablet Take 81 mg by mouth daily.    . pravastatin (PRAVACHOL) 80 MG tablet Take 80 mg by mouth at bedtime.     No facility-administered medications prior to visit.     PHYSICAL EXAM  Filed Vitals:  03/07/15 1515  BP: 162/59  Pulse: 61  Weight: 172 lb 12.8 oz (78.382 kg)   Body mass index is 37.38 kg/(m^2).  General: well developed, well nourished, seated, in no evident distress  Head: head normocephalic and atraumatic. Orohparynx benign  Neck: supple with no carotid or supraclavicular bruits  Cardiovascular: regular rate and rhythm, no murmurs  Musculoskeletal mild kyphosis  Skin mild pedal edema. No rash.   Neurologic Exam  Mental Status: Awake and fully alert. Oriented to place and time. Recent and remote memory intact. Attention span, concentration and fund of knowledge appropriate. Mood and affect appropriate. MMSE  Not done (Last visit Mini-Mental status exam score 28/30 with one deficit in orientation and recall. Animal fluencytest 8. Clock drawing 4/4. Geriatric depression scale 2)  Cranial Nerves: Pupils equal, briskly reactive to light. Extraocular movements full without nystagmus. Visual fields full to confrontation. Hearing intact and symmetric to finer snap. Facial sensation intact. Face, tongue, palate move normally and symmetrically. Neck flexion and extension normal.  Motor: Normal bulk and tone. Normal strength in all tested extremity muscles. Diminished fine  finger movements on the left. Orbits right over left upper extremity.  Sensory: intact to touch and pinprick and vibratory.  Coordination: Rapid alternating movements normal in all extremities. Finger-to-nose and heel-to-shin performed accurately bilaterally.  Gait and Station: Arises from chair without difficulty. Stance is normal. Gait demonstrates normal stride length and balance. Slow and careful gait. Not able to heel, toe and tandem walk without difficulty. Uses a cane.  Reflexes: 1+ and symmetric. Toes downgoing.   10/13/12 CAROTID DOPPLER - No significant extracranial carotid artery stenosis demonstrated. Higher velocities in the 40-59% range in the the left mid ICA areprobably due to vessel tortuosity. ICA/CCA ratio is 0.63 on the right and 1.47 on the left. Vertebrals are patent with antegrade flow. CUS 03/04/14 : no significant extracranial stenosis  ASSESSMENT AND PLAN 81 year old lady with left hemispheric TIA in December 2013 and right MCA branch infarcts likely of embolic etiology without definite identified source of embolism. Vascular risk factors of hypertension and hyperlipidemia. Mild age-appropriate cognitive impairment.   PLAN:    I had a long discussion with the patient and her caregiver friend with regards to her remote stroke from which she seems to be doing reasonably well. Continue Plavix for stroke prevention and strict control of hypertension with blood pressure goal below 130/90, lipids with LDL cholesterol goal below 100 mg percent. Referred to Dr. Mickel Duhamel gastroenterologist for evaluation for possible gastroesophageal reflux disease as a cause of her throat congestion and discomfort.  Orders Placed This Encounter  Procedures  . Ambulatory referral to Gastroenterology   Return in about 1 year (around 03/06/2016).  Antony Contras, MD  03/07/2015, 9:47 PM [facility] [address], [place], [place] [address] [phone]  Note: This  document was prepared with digital dictation and possible smart phrase technology. Any transcriptional errors that result from this process are unintentional.

## 2015-03-07 NOTE — Patient Instructions (Signed)
I had a long discussion with the patient and her caregiver friend with regards to her remote stroke from which she seems to be doing reasonably well. Continue Plavix for stroke prevention and strict control of hypertension with blood pressure goal below 130/90, lipids with LDL cholesterol goal below 100 mg percent. Referred to Dr. Mickel Duhamel gastroenterologist for evaluation for possible gastroesophageal reflux disease as a cause of her throat congestion and discomfort.

## 2015-03-28 ENCOUNTER — Other Ambulatory Visit: Payer: Self-pay | Admitting: Gastroenterology

## 2015-03-28 DIAGNOSIS — K219 Gastro-esophageal reflux disease without esophagitis: Secondary | ICD-10-CM | POA: Diagnosis not present

## 2015-03-31 DIAGNOSIS — M1712 Unilateral primary osteoarthritis, left knee: Secondary | ICD-10-CM | POA: Diagnosis not present

## 2015-03-31 DIAGNOSIS — M25562 Pain in left knee: Secondary | ICD-10-CM | POA: Diagnosis not present

## 2015-03-31 DIAGNOSIS — Z6835 Body mass index (BMI) 35.0-35.9, adult: Secondary | ICD-10-CM | POA: Diagnosis not present

## 2015-03-31 DIAGNOSIS — R609 Edema, unspecified: Secondary | ICD-10-CM | POA: Diagnosis not present

## 2015-04-04 ENCOUNTER — Other Ambulatory Visit: Payer: Medicare Other

## 2015-04-05 ENCOUNTER — Ambulatory Visit
Admission: RE | Admit: 2015-04-05 | Discharge: 2015-04-05 | Disposition: A | Discharge status: Home / Self Care | Payer: Medicare Other | Source: Ambulatory Visit | Attending: Gastroenterology | Admitting: Gastroenterology

## 2015-04-05 ENCOUNTER — Other Ambulatory Visit: Payer: Medicare Other

## 2015-04-05 DIAGNOSIS — K449 Diaphragmatic hernia without obstruction or gangrene: Secondary | ICD-10-CM | POA: Diagnosis not present

## 2015-04-05 DIAGNOSIS — K219 Gastro-esophageal reflux disease without esophagitis: Secondary | ICD-10-CM | POA: Diagnosis not present

## 2015-04-10 ENCOUNTER — Ambulatory Visit (INDEPENDENT_AMBULATORY_CARE_PROVIDER_SITE_OTHER): Payer: Medicare Other | Admitting: Family Medicine

## 2015-04-10 ENCOUNTER — Encounter: Payer: Self-pay | Admitting: Family Medicine

## 2015-04-10 VITALS — BP 132/70 | HR 76 | Temp 97.5°F | Ht <= 58 in | Wt 176.7 lb

## 2015-04-10 DIAGNOSIS — Z8673 Personal history of transient ischemic attack (TIA), and cerebral infarction without residual deficits: Secondary | ICD-10-CM | POA: Diagnosis not present

## 2015-04-10 DIAGNOSIS — M159 Polyosteoarthritis, unspecified: Secondary | ICD-10-CM

## 2015-04-10 DIAGNOSIS — J309 Allergic rhinitis, unspecified: Secondary | ICD-10-CM

## 2015-04-10 DIAGNOSIS — M1990 Unspecified osteoarthritis, unspecified site: Secondary | ICD-10-CM | POA: Insufficient documentation

## 2015-04-10 DIAGNOSIS — K219 Gastro-esophageal reflux disease without esophagitis: Secondary | ICD-10-CM | POA: Insufficient documentation

## 2015-04-10 DIAGNOSIS — Z6838 Body mass index (BMI) 38.0-38.9, adult: Secondary | ICD-10-CM | POA: Diagnosis not present

## 2015-04-10 DIAGNOSIS — F39 Unspecified mood [affective] disorder: Secondary | ICD-10-CM

## 2015-04-10 DIAGNOSIS — Z23 Encounter for immunization: Secondary | ICD-10-CM

## 2015-04-10 DIAGNOSIS — I10 Essential (primary) hypertension: Secondary | ICD-10-CM

## 2015-04-10 DIAGNOSIS — R739 Hyperglycemia, unspecified: Secondary | ICD-10-CM

## 2015-04-10 DIAGNOSIS — C4491 Basal cell carcinoma of skin, unspecified: Secondary | ICD-10-CM

## 2015-04-10 DIAGNOSIS — E785 Hyperlipidemia, unspecified: Secondary | ICD-10-CM

## 2015-04-10 DIAGNOSIS — M810 Age-related osteoporosis without current pathological fracture: Secondary | ICD-10-CM

## 2015-04-10 DIAGNOSIS — R600 Localized edema: Secondary | ICD-10-CM

## 2015-04-10 DIAGNOSIS — Z7189 Other specified counseling: Secondary | ICD-10-CM

## 2015-04-10 DIAGNOSIS — Z7689 Persons encountering health services in other specified circumstances: Secondary | ICD-10-CM

## 2015-04-10 DIAGNOSIS — N3281 Overactive bladder: Secondary | ICD-10-CM

## 2015-04-10 HISTORY — DX: Basal cell carcinoma of skin, unspecified: C44.91

## 2015-04-10 HISTORY — DX: Overactive bladder: N32.81

## 2015-04-10 MED ORDER — FUROSEMIDE 20 MG PO TABS: 20.0000 mg | ORAL_TABLET | Freq: Every day | ORAL | Status: DC

## 2015-04-10 MED ORDER — AMLODIPINE BESYLATE 5 MG PO TABS: 5.0000 mg | ORAL_TABLET | Freq: Every day | ORAL | Status: DC

## 2015-04-10 NOTE — Patient Instructions (Addendum)
BEFORE YOU LEAVE: -schedule follow up appointment in 2 weeks - please take all of your blood pressure medication and drink plenty of water before your appointment - but do not eat any food or drink anything other then water for 8 hours prior to your appointment  DECREASE the norvasc (amlodipine) to 5 mg daily  TAKE the lasix (furosemide) 20mg  daily in the morning  We recommend the following healthy lifestyle measures: - eat a healthy diet consisting of lots of vegetables, fruits, beans, nuts, seeds, healthy meats such as white chicken and fish  - avoid fried foods, starches, sweets, fast food, processed foods, sodas, red meet and other fattening foods.  - get a least 150 minutes of aerobic exercise per week.   Wear compression socks daily  Elevate legs daily for 30 minutes daily

## 2015-04-10 NOTE — Progress Notes (Signed)
HPI:  Charlene Wade is here to establish care.  Used to see Charlene Wade but she was unhappy with her prior doctor because she was too young.  Has the following chronic problems that require follow up and concerns today:  HTN/HLD/Obesity: -meds: amlodipine 10mg  daily, fenofibrate, lasix 20mg  added recently for LE edema, fish oil, losartan 50mg    -denies: HA, CP, SOB -on cholesterol medication in the past -hx of lower ext remotely and on lasix in the past for this too, reports bilat LE edema for > 15 years -denies: CP, SOB, DOE  GERD/PND: -pantoprazole, ranitidine -hx hiatal hernia -sees Dr. Senaida Ores for this -also saw ENT and allergist -denies: heartburn, dysphagia  Hx Stroke -sees neurologist, hx of some memory issues -TIA 2012-02-01, R MCA -on plavix -stable per her report  OAB: -takes oxybutynin  -stable  OA/Chronic low back pain: -L knee -went to walk-in clinic in 2014/01/31 and had xrays and has bad OA -hx of DDD, s/p remote disc surgery per her report  Depression: -started when son and husband died in February 01, 2004 -on wellbutrin 75 mg daily for this -stable, no SI  Allergic rhinitis: -takes zyrtec daily  Osteoporosis: -has been on prolia injections for 3 years, reports used to see specialist but specialist retired -denies any hx of fractures other then when fell on arm remotely  Hx of skin cancer on the nose, BCC: -sees dermatologist for skin checks   ROS negative for unless reported above: fevers, unintentional weight loss, hearing or vision loss, chest pain, palpitations, struggling to breath, hemoptysis, melena, hematochezia, hematuria, falls, loc, si, thoughts of self harm  Past Medical History  Diagnosis Date  . Stroke     sees neurologist, hx memory loss, expressive aphasia  . Hypertension   . Hyperlipidemia   . GERD (gastroesophageal reflux disease)     managed by Dr. Senaida Ores, hx hiatal hernia and chronic PND  . OA (osteoarthritis)     knees and back, hx DDD,  s/p remote lumbar disc surgery  . Osteoporosis     on prolia in the past  . Depression 10/17/2012  . BCC (basal cell carcinoma of skin) 04/10/2015    sees dermatologist  . OAB (overactive bladder) 04/10/2015  . Ectopic pregnancy   . Allergic rhinitis   . Obesity   . Venous insufficiency     Past Surgical History  Procedure Laterality Date  . Abdominal hysterectomy    . Breast reduction surgery    . Laparoscopic salpingoopherectomy    . Appendectomy    . Nasal mass excision      Basal Cell Cancer Excision  . Nasal reconstruction    . Tee without cardioversion  10/22/2012    Procedure: TRANSESOPHAGEAL ECHOCARDIOGRAM (TEE);  Surgeon: Josue Hector, MD;  Location: [facility] ENDOSCOPY;  Service: Cardiovascular;  Laterality: N/A;    Family History  Problem Relation Age of Onset  . Hypertension Father   . Hypertension Mother   . Cancer - Lung Brother     History   Social History  . Marital Status: Widowed    Spouse Name: N/A  . Number of Children: 2  . Years of Education: 12   Occupational History  . retired    Social History Main Topics  . Smoking status: Former Research scientist (life sciences)  . Smokeless tobacco: Never Used  . Alcohol Use: No  . Drug Use: No  . Sexual Activity: No   Other Topics Concern  . None   Social History Narrative  Patient is single with 2 children.   Patient is right handed.   Patient has 12 th grade education.   Patient does not drink caffeine.     Current outpatient prescriptions:  .  acetaminophen (TYLENOL) 500 MG tablet, Take 500 mg by mouth as needed for pain., Disp: , Rfl:  .  amLODipine (NORVASC) 5 MG tablet, Take 1 tablet (5 mg total) by mouth daily., Disp: 90 tablet, Rfl: 3 .  buPROPion (WELLBUTRIN) 75 MG tablet, Take 75 mg by mouth daily., Disp: , Rfl:  .  Calcium Citrate-Vitamin D 250-200 MG-UNIT TABS, Take by mouth. Take two tabs daily, Disp: , Rfl:  .  cetirizine (ZYRTEC) 10 MG tablet, Take 10 mg by mouth daily., Disp: , Rfl:  .  Cinnamon 500 MG  capsule, Take 1,000 mg by mouth daily., Disp: , Rfl:  .  clopidogrel (PLAVIX) 75 MG tablet, Take 1 tablet (75 mg total) by mouth daily with breakfast., Disp: 30 tablet, Rfl: 3 .  Coenzyme Q10 (CO Q 10) 100 MG CAPS, Take 1 tablet by mouth daily. Hold while in hospital, Disp: , Rfl:  .  denosumab (PROLIA) 60 MG/ML SOLN injection, Inject 60 mg into the skin every 6 (six) months. Administer in upper arm, thigh, or abdomen, Disp: , Rfl:  .  fenofibrate (TRICOR) 145 MG tablet, Take 145 mg by mouth daily., Disp: , Rfl:  .  fluticasone (FLONASE) 50 MCG/ACT nasal spray, Place 2 sprays into the nose 2 (two) times daily. , Disp: , Rfl:  .  Glucosamine-Chondroitin (OSTEO BI-FLEX REGULAR STRENGTH) 250-200 MG TABS, Take 1 tablet by mouth daily. Hold while in hospital, Disp: , Rfl:  .  losartan (COZAAR) 50 MG tablet, , Disp: , Rfl:  .  Multiple Vitamins-Minerals (CENTRUM SILVER PO), Take by mouth daily., Disp: , Rfl:  .  oxybutynin (DITROPAN-XL) 5 MG 24 hr tablet, Take 5 mg by mouth daily., Disp: , Rfl:  .  pantoprazole (PROTONIX) 40 MG tablet, Take 40 mg by mouth daily., Disp: , Rfl:  .  ranitidine (ZANTAC) 300 MG tablet, , Disp: , Rfl:  .  vitamin B-12 (CYANOCOBALAMIN) 1000 MCG tablet, Take 1,000 mcg by mouth daily., Disp: , Rfl:  .  furosemide (LASIX) 20 MG tablet, Take 1 tablet (20 mg total) by mouth daily., Disp: 30 tablet, Rfl: 3  EXAM:  Filed Vitals:   04/10/15 1116  BP: 132/70  Pulse: 76  Temp: 97.5 F (36.4 C)    Body mass index is 38.23 kg/(m^2).  GENERAL: vitals reviewed and listed above, alert, oriented, appears well hydrated and in no acute distress  HEENT: atraumatic, conjunttiva clear, no obvious abnormalities on inspection of external nose and ears  NECK: no obvious masses on inspection  LUNGS: clear to auscultation bilaterally, no wheezes, rales or rhonchi, good air movement  CV: HRRR, 1-2+ bilateral LE edema  MS: moves all extremities without noticeable abnormality  PSYCH:  pleasant and cooperative, no obvious depression or anxiety  ASSESSMENT AND PLAN:  Discussed the following assessment and plan:  H/O: CVA (cerebrovascular accident)  BMI 38.0-38.9,adult  Essential hypertension - Plan: Basic metabolic panel  Mood disorder  Hyperlipidemia - Plan: Lipid Panel  Gastroesophageal reflux disease, esophagitis presence not specified  Osteoporosis - Plan: Ambulatory referral to Endocrinology  Hyperglycemia - Plan: Hemoglobin A1c  Bilateral edema of lower extremity  OAB (overactive bladder)  Osteoarthritis of multiple joints, unspecified osteoarthritis type  Allergic rhinitis, unspecified allergic rhinitis type  Need for prophylactic vaccination with combined diphtheria-tetanus-pertussis (  DTP) vaccine - Plan: Td vaccine greater than or equal to 7yo preservative free IM  Encounter to establish care  -We reviewed the PMH, PSH, FH, SH, Meds and Allergies. -We provided refills for any medications we will prescribe as needed. -We addressed current concerns per orders and patient instructions. -We have asked for records for pertinent exams, studies, vaccines and notes from previous providers. -We have advised patient to follow up per instructions below.   -Patient advised to return or notify a doctor immediately if symptoms worsen or persist or new concerns arise.  Patient Instructions  BEFORE YOU LEAVE: -schedule follow up appointment in 2 weeks - please take all of your blood pressure medication and drink plenty of water before your appointment - but do not eat any food or drink anything other then water for 8 hours prior to your appointment  DECREASE the norvasc (amlodipine) to 5 mg daily  TAKE the lasix (furosemide) 20mg  daily in the morning  We recommend the following healthy lifestyle measures: - eat a healthy diet consisting of lots of vegetables, fruits, beans, nuts, seeds, healthy meats such as white chicken and fish  - avoid fried foods,  starches, sweets, fast food, processed foods, sodas, red meet and other fattening foods.  - get a least 150 minutes of aerobic exercise per week.   Wear compression socks daily  Elevate legs daily for 30 minutes daily       Juanisha Bautch R.

## 2015-04-10 NOTE — Progress Notes (Signed)
Pre visit review using our clinic review tool, if applicable. No additional management support is needed unless otherwise documented below in the visit note. 

## 2015-04-11 ENCOUNTER — Telehealth: Payer: Self-pay | Admitting: Family Medicine

## 2015-04-11 DIAGNOSIS — M1712 Unilateral primary osteoarthritis, left knee: Secondary | ICD-10-CM | POA: Diagnosis not present

## 2015-04-11 NOTE — Telephone Encounter (Signed)
Pt is taking furosemide daily and would like to know should she take potassium daily also. Pt was seen yesterday

## 2015-04-11 NOTE — Telephone Encounter (Signed)
I spoke with Ms Randol Kern and she stated the pt was told to take Potassium when she takes Furosemide.  I advised Ms Randol Kern per Dr Maudie Mercury she should continue this if recommended by her previous doctor.

## 2015-04-11 NOTE — Telephone Encounter (Signed)
Does not need to take potassium if was not already taking it. We plan to check her potassium in 2 weeks at her follow up.

## 2015-04-25 ENCOUNTER — Encounter: Payer: Self-pay | Admitting: Family Medicine

## 2015-04-25 ENCOUNTER — Ambulatory Visit (INDEPENDENT_AMBULATORY_CARE_PROVIDER_SITE_OTHER): Payer: Medicare Other | Admitting: Family Medicine

## 2015-04-25 VITALS — BP 118/68 | HR 62 | Temp 98.2°F | Ht <= 58 in | Wt 170.6 lb

## 2015-04-25 DIAGNOSIS — Z8673 Personal history of transient ischemic attack (TIA), and cerebral infarction without residual deficits: Secondary | ICD-10-CM

## 2015-04-25 DIAGNOSIS — J309 Allergic rhinitis, unspecified: Secondary | ICD-10-CM

## 2015-04-25 DIAGNOSIS — I10 Essential (primary) hypertension: Secondary | ICD-10-CM

## 2015-04-25 DIAGNOSIS — K219 Gastro-esophageal reflux disease without esophagitis: Secondary | ICD-10-CM | POA: Diagnosis not present

## 2015-04-25 DIAGNOSIS — F39 Unspecified mood [affective] disorder: Secondary | ICD-10-CM | POA: Diagnosis not present

## 2015-04-25 DIAGNOSIS — R739 Hyperglycemia, unspecified: Secondary | ICD-10-CM

## 2015-04-25 DIAGNOSIS — E785 Hyperlipidemia, unspecified: Secondary | ICD-10-CM | POA: Diagnosis not present

## 2015-04-25 DIAGNOSIS — Z6838 Body mass index (BMI) 38.0-38.9, adult: Secondary | ICD-10-CM

## 2015-04-25 LAB — LIPID PANEL
CHOL/HDL RATIO: 2
Cholesterol: 183 mg/dL (ref 0–200)
HDL: 84.7 mg/dL (ref >39.00)
LDL CALC: 81 mg/dL (ref 0–99)
NONHDL: 98.3
Triglycerides: 89 mg/dL (ref 0.0–149.0)
VLDL: 17.8 mg/dL (ref 0.0–40.0)

## 2015-04-25 LAB — BASIC METABOLIC PANEL
BUN: 24 mg/dL — ABNORMAL HIGH (ref 6–23)
CHLORIDE: 108 meq/L (ref 96–112)
CO2: 28 mEq/L (ref 19–32)
Calcium: 8.7 mg/dL (ref 8.4–10.5)
Creatinine, Ser: 1.24 mg/dL — ABNORMAL HIGH (ref 0.40–1.20)
GFR: 43.66 mL/min — ABNORMAL LOW (ref >60.00)
Glucose, Bld: 95 mg/dL (ref 70–99)
POTASSIUM: 5.1 meq/L (ref 3.5–5.1)
SODIUM: 143 meq/L (ref 135–145)

## 2015-04-25 LAB — HEMOGLOBIN A1C: HEMOGLOBIN A1C: 5.9 % (ref 4.6–6.5)

## 2015-04-25 NOTE — Progress Notes (Signed)
Pre visit review using our clinic review tool, if applicable. No additional management support is needed unless otherwise documented below in the visit note. 

## 2015-04-25 NOTE — Addendum Note (Signed)
Addended by: Denna Haggard K on: 04/25/2015 11:09 AM   Modules accepted: Orders

## 2015-04-25 NOTE — Progress Notes (Signed)
HPI:  HTN/HLD/Obesity: -labs today -meds: amlodipine 5mg  daily (decreased due to swelling 03/2015), fenofibrate, lasix 20mg , fish oil, losartan 50mg   -reports takes potassium daily -denies: HA, CP, SOB -on cholesterol medication in the past -hx of lower ext edema remotely and on lasix in the past for this too, reports bilat LE edema for > 15 years -denies: CP, SOB, DOE  GERD/PND: -pantoprazole, ranitidine -hx hiatal hernia -sees Dr. Senaida Ores for this -also saw ENT and allergist -denies: heartburn, dysphagia  Hx Stroke -sees neurologist, hx of some memory issues -TIA Feb 03, 2012, R MCA -on plavix -stable per her report  OAB: -takes oxybutynin  -stable  OA/Chronic low back pain: -L knee -went to walk-in clinic in 2014-02-02 and had xrays and has bad OA -hx of DDD, s/p remote disc surgery per her report  Depression: -started when son and husband died in 02/03/04 -on wellbutrin 75 mg daily for this -stable, no SI  Allergic rhinitis: -takes zyrtec daily  Osteoporosis: -has been on prolia injections for 3 years, reports used to see specialist but specialist retired -denies any hx of fractures other then when fell on arm remotely  Hx of skin cancer on the nose, BCC: -sees dermatologist for skin checks   ROS negative for unless reported above: fevers, unintentional weight loss, hearing or vision loss, chest pain, palpitations, struggling to breath, hemoptysis, melena, hematochezia, hematuria, falls, loc, si, thoughts of self harm  ROS: See pertinent positives and negatives per HPI.  Past Medical History  Diagnosis Date  . Stroke     sees neurologist, hx memory loss, expressive aphasia  . Hypertension   . Hyperlipidemia   . GERD (gastroesophageal reflux disease)     managed by Dr. Senaida Ores, hx hiatal hernia and chronic PND  . OA (osteoarthritis)     knees and back, hx DDD, s/p remote lumbar disc surgery  . Osteoporosis     on prolia in the past  . Depression 10/17/2012  . BCC  (basal cell carcinoma of skin) 04/10/2015    sees dermatologist  . OAB (overactive bladder) 04/10/2015  . Ectopic pregnancy   . Allergic rhinitis   . Obesity   . Venous insufficiency     Past Surgical History  Procedure Laterality Date  . Abdominal hysterectomy    . Breast reduction surgery    . Laparoscopic salpingoopherectomy    . Appendectomy    . Nasal mass excision      Basal Cell Cancer Excision  . Nasal reconstruction    . Tee without cardioversion  10/22/2012    Procedure: TRANSESOPHAGEAL ECHOCARDIOGRAM (TEE);  Surgeon: Josue Hector, MD;  Location: [facility] ENDOSCOPY;  Service: Cardiovascular;  Laterality: N/A;    Family History  Problem Relation Age of Onset  . Hypertension Father   . Hypertension Mother   . Cancer - Lung Brother     History   Social History  . Marital Status: Widowed    Spouse Name: N/A  . Number of Children: 2  . Years of Education: 12   Occupational History  . retired    Social History Main Topics  . Smoking status: Former Research scientist (life sciences)  . Smokeless tobacco: Never Used  . Alcohol Use: No  . Drug Use: No  . Sexual Activity: No   Other Topics Concern  . None   Social History Narrative   Patient is single with 2 children.   Patient is right handed.   Patient has 12 th grade education.   Patient does not drink caffeine.  Current outpatient prescriptions:  .  acetaminophen (TYLENOL) 500 MG tablet, Take 500 mg by mouth as needed for pain., Disp: , Rfl:  .  amLODipine (NORVASC) 5 MG tablet, Take 1 tablet (5 mg total) by mouth daily., Disp: 90 tablet, Rfl: 3 .  buPROPion (WELLBUTRIN) 75 MG tablet, Take 75 mg by mouth daily., Disp: , Rfl:  .  Calcium Citrate-Vitamin D 250-200 MG-UNIT TABS, Take by mouth. Take two tabs daily, Disp: , Rfl:  .  cetirizine (ZYRTEC) 10 MG tablet, Take 10 mg by mouth daily., Disp: , Rfl:  .  Cinnamon 500 MG capsule, Take 1,000 mg by mouth daily., Disp: , Rfl:  .  clopidogrel (PLAVIX) 75 MG tablet, Take 1 tablet (75  mg total) by mouth daily with breakfast., Disp: 30 tablet, Rfl: 3 .  Coenzyme Q10 (CO Q 10) 100 MG CAPS, Take 1 tablet by mouth daily. Hold while in hospital, Disp: , Rfl:  .  denosumab (PROLIA) 60 MG/ML SOLN injection, Inject 60 mg into the skin every 6 (six) months. Administer in upper arm, thigh, or abdomen, Disp: , Rfl:  .  fenofibrate (TRICOR) 145 MG tablet, Take 145 mg by mouth daily., Disp: , Rfl:  .  fluticasone (FLONASE) 50 MCG/ACT nasal spray, Place 2 sprays into the nose 2 (two) times daily. , Disp: , Rfl:  .  furosemide (LASIX) 20 MG tablet, Take 1 tablet (20 mg total) by mouth daily., Disp: 30 tablet, Rfl: 3 .  Glucosamine-Chondroitin (OSTEO BI-FLEX REGULAR STRENGTH) 250-200 MG TABS, Take 1 tablet by mouth daily. Hold while in hospital, Disp: , Rfl:  .  losartan (COZAAR) 50 MG tablet, , Disp: , Rfl:  .  Multiple Vitamins-Minerals (CENTRUM SILVER PO), Take by mouth daily., Disp: , Rfl:  .  oxybutynin (DITROPAN-XL) 5 MG 24 hr tablet, Take 5 mg by mouth daily., Disp: , Rfl:  .  pantoprazole (PROTONIX) 40 MG tablet, Take 40 mg by mouth daily., Disp: , Rfl:  .  ranitidine (ZANTAC) 300 MG tablet, , Disp: , Rfl:  .  vitamin B-12 (CYANOCOBALAMIN) 1000 MCG tablet, Take 1,000 mcg by mouth daily., Disp: , Rfl:   EXAM:  Filed Vitals:   04/25/15 1037  BP: 118/68  Pulse: 62  Temp: 98.2 F (36.8 C)    Body mass index is 36.91 kg/(m^2).  GENERAL: vitals reviewed and listed above, alert, oriented, appears well hydrated and in no acute distress  HEENT: atraumatic, conjunttiva clear, no obvious abnormalities on inspection of external nose and ears  NECK: no obvious masses on inspection  LUNGS: clear to auscultation bilaterally, no wheezes, rales or rhonchi, good air movement  CV: HRRR, no peripheral edema  MS: moves all extremities without noticeable abnormality  PSYCH: pleasant and cooperative, no obvious depression or anxiety  ASSESSMENT AND PLAN:  Discussed the following  assessment and plan:  Essential hypertension  Hyperlipidemia  H/O: CVA (cerebrovascular accident)  BMI 38.0-38.9,adult  Gastroesophageal reflux disease, esophagitis presence not specified  Mood disorder  Allergic rhinitis, unspecified allergic rhinitis type  -she is FASTING this morning -blood pressure looks good -continue current regimen 60follow up in 3 months -Patient advised to return or notify a doctor immediately if symptoms worsen or persist or new concerns arise.  There are no Patient Instructions on file for this visit.   Colin Benton R.

## 2015-04-26 ENCOUNTER — Encounter: Payer: Self-pay | Admitting: Family Medicine

## 2015-04-26 DIAGNOSIS — N183 Chronic kidney disease, stage 3 unspecified: Secondary | ICD-10-CM | POA: Insufficient documentation

## 2015-04-26 HISTORY — DX: Chronic kidney disease, stage 3 unspecified: N18.30

## 2015-05-03 DIAGNOSIS — K219 Gastro-esophageal reflux disease without esophagitis: Secondary | ICD-10-CM | POA: Diagnosis not present

## 2015-05-10 DIAGNOSIS — H35033 Hypertensive retinopathy, bilateral: Secondary | ICD-10-CM | POA: Diagnosis not present

## 2015-05-10 DIAGNOSIS — H3531 Nonexudative age-related macular degeneration: Secondary | ICD-10-CM | POA: Diagnosis not present

## 2015-05-10 DIAGNOSIS — Z961 Presence of intraocular lens: Secondary | ICD-10-CM | POA: Diagnosis not present

## 2015-06-02 ENCOUNTER — Ambulatory Visit (INDEPENDENT_AMBULATORY_CARE_PROVIDER_SITE_OTHER): Payer: Medicare Other | Admitting: Internal Medicine

## 2015-06-02 ENCOUNTER — Encounter: Payer: Self-pay | Admitting: Internal Medicine

## 2015-06-02 VITALS — BP 130/82 | HR 73 | Temp 98.3°F | Resp 16 | Ht <= 58 in | Wt 173.0 lb

## 2015-06-02 DIAGNOSIS — M810 Age-related osteoporosis without current pathological fracture: Secondary | ICD-10-CM | POA: Diagnosis not present

## 2015-06-02 LAB — TSH: TSH: 1.48 u[IU]/mL (ref 0.35–4.50)

## 2015-06-02 NOTE — Patient Instructions (Signed)
Please stop at the lab.  We will schedule a DEXA scan for you.  Please try to get ~1000-1200 mg calcium a day and ~2000 units of vitamin D a day.   How Can I Prevent Falls? Men and women with osteoporosis need to take care not to fall down. Falls can break bones. Some reasons people fall are: Poor vision  Poor balance  Certain diseases that affect how you walk  Some types of medicine, such as sleeping pills.  Some tips to help prevent falls outdoors are: Use a cane or walker  Wear rubber-soled shoes so you don't slip  Walk on grass when sidewalks are slippery  In winter, put salt or kitty litter on icy sidewalks.  Some ways to help prevent falls indoors are: Keep rooms free of clutter, especially on floors  Use plastic or carpet runners on slippery floors  Wear low-heeled shoes that provide good support  Do not walk in socks, stockings, or slippers  Be sure carpets and area rugs have skid-proof backs or are tacked to the floor  Be sure stairs are well lit and have rails on both sides  Put grab bars on bathroom walls near tub, shower, and toilet  Use a rubber bath mat in the shower or tub  Keep a flashlight next to your bed  Use a sturdy step stool with a handrail and wide steps  Add more lights in rooms (and night lights) Buy a cordless phone to keep with you so that you don't have to rush to the phone       when it rings and so that you can call for help if you fall.   (adapted from http://www.niams.NightlifePreviews.se)  Dietary sources of calcium and vitamin D:  Calcium content (mg) - http://www.niams.MoviePins.co.za  Fortified oatmeal, 1 packet 350  Sardines, canned in oil, with edible bones, 3 oz. 324  Cheddar cheese, 1 oz. shredded 306  Milk, nonfat, 1 cup 302  Milkshake, 1 cup 300  Yogurt, plain, low-fat, 1 cup 300  Soybeans, cooked, 1 cup 261  Tofu, firm, with calcium,  cup 204  Orange juice, fortified  with calcium, 6 oz. 200-260 (varies)  Salmon, canned, with edible bones, 3 oz. 181  Pudding, instant, made with 2% milk,  cup 153  Baked beans, 1 cup [place], 1% milk fat, 1 cup 138  Spaghetti, lasagna, 1 cup 125  Frozen yogurt, vanilla, soft-serve,  cup 103  Ready-to-eat cereal, fortified with calcium, 1 cup 100-1,000 (varies)  Cheese pizza, 1 slice 123XX123  Fortified waffles, 2 100  Turnip greens, boiled,  cup 99  Broccoli, raw, 1 cup 90  Ice cream, vanilla,  cup 85  Soy or rice milk, fortified with calcium, 1 cup 80-500 (varies)   Vitamin D content (International Units, IU) - https://www.ars.usda.gov Cod liver oil, 1 tablespoon 1,360  Swordfish, cooked, 3 oz 566  Salmon (sockeye), cooked, 3 oz 447  Tuna fish, canned in water, drained, 3 oz 154  Orange juice fortified with vitamin D, 1 cup (check product labels, as amount of added vitamin D varies) 137  Milk, nonfat, reduced fat, and whole, vitamin D-fortified, 1 cup 115-124  Yogurt, fortified with 20% of the daily value for vitamin D, 6 oz 80  Margarine, fortified, 1 tablespoon 60  Sardines, canned in oil, drained, 2 sardines 46  Liver, beef, cooked, 3 oz 42  Egg, 1 large (vitamin D is found in yolk) 41  Ready-to-eat cereal, fortified with 10% of the  daily value for vitamin D, 0.75-1 cup  40  Cheese, Swiss, 1 oz 6

## 2015-06-02 NOTE — Progress Notes (Addendum)
Patient ID: Charlene Wade, female   DOB: 06/21/1928, 81 y.o.   MRN: [id number]   HPI  Charlene Wade is a 81 y.o.-year-old female, referred by her PCP, Dr. Maudie Mercury, for management of osteoporosis. She is here with her caretaker who offers part of the hx.  Pt was dx with OP "many years ago".   No DEXA scans available for review (last done 10-15 years ago) - previous done in [place].  No dizziness/vertigo/orthostasis. She has macular degeneration, but no signif. Blurry vision.  She fell and had a R humerus fx (comminuted) >> in 11/2011.  She has been on the following OP treatments:  - Fosamax - for "many years"  - developed a cyst 5-6 years ago in the bone of the wisdom tooth >> extraction >> infection >> was told she had a jaw fracture - Prolia - started 4 years ago  - finished 6 mo ago [facility] [employer]).   No h/o vitamin D deficiency. No recent vit D levels.  Pt was on calcium and vitamin D - only takes then when she remembers. She also eats dairy (yoghurt every day) but no green, leafy, vegetables.   No weight bearing exercises.   No stairs at home. 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   She does not take high vitamin A doses.  No h/o hyper/hypocalcemia. No h/o hyperparathyroidism. No h/o kidney stones. Lab Results  Component Value Date   PTH 8.3* 10/13/2012   CALCIUM 8.7 04/25/2015   CALCIUM 9.7 10/16/2012   CALCIUM 10.0 10/14/2012   CALCIUM 10.3 10/13/2012   CALCIUM 10.6* 10/12/2012   No h/o thyrotoxicosis. No recent TSH levels available for review.  She has a h/o CKD. Last BUN/Cr: Lab Results  Component Value Date   BUN 24* 04/25/2015   CREATININE 1.24* 04/25/2015  GFR 43.66.  Pt does have a FH of osteoporosis. Mother with osteoporosis - arm fx.   I reviewed her chart and she also has a history of spinal stenosis - s/p surgery for ruptured disk. She had inj in spine.   ROS: Constitutional: + weight gain and loss, + fatigue, no subjective  hyperthermia/hypothermia, + poor sleep Eyes: no blurry vision, no xerophthalmia ENT: + sore throat, no nodules palpated in throat, no dysphagia/odynophagia, + hoarseness, + decreased hearing, + tinnitus, + excessive urination Cardiovascular: no CP/+ SOB/no palpitations/+ leg swelling Respiratory: + cough/+ SOB Gastrointestinal: no N/V/D/C/+ heartburn Musculoskeletal: + both: muscle/joint aches Skin: no rashes, + itching Neurological: no tremors/numbness/tingling/dizziness Psychiatric: no depression/anxiety  Past Medical History  Diagnosis Date  . Stroke     sees neurologist, hx memory loss, expressive aphasia  . Hypertension   . Hyperlipidemia   . GERD (gastroesophageal reflux disease)     managed by Dr. Senaida Ores, hx hiatal hernia and chronic PND  . OA (osteoarthritis)     knees and back, hx DDD, s/p remote lumbar disc surgery  . Osteoporosis     on prolia in the past  . Depression 10/17/2012  . BCC (basal cell carcinoma of skin) 04/10/2015    sees dermatologist  . OAB (overactive bladder) 04/10/2015  . Ectopic pregnancy   . Allergic rhinitis   . Obesity   . Venous insufficiency    Past Surgical History  Procedure Laterality Date  . Abdominal hysterectomy    . Breast reduction surgery    . Laparoscopic salpingoopherectomy    . Appendectomy    . Nasal mass excision  Basal Cell Cancer Excision  . Nasal reconstruction    . Tee without cardioversion  10/22/2012    Procedure: TRANSESOPHAGEAL ECHOCARDIOGRAM (TEE);  Surgeon: Josue Hector, MD;  Location: [facility] ENDOSCOPY;  Service: Cardiovascular;  Laterality: N/A;   History   Social History  . Marital Status: Widowed    Spouse Name: N/A  . Number of Children: 2  . Years of Education: 12   Occupational History  . retired    Social History Main Topics  . Smoking status: Former Research scientist (life sciences)  . Smokeless tobacco: Never Used  . Alcohol Use: No  . Drug Use: No  . Sexual Activity: No   Other Topics Concern  . Not on file    Social History Narrative   Patient is single with 2 children.   Patient is right handed.   Patient has 12 th grade education.   Patient does not drink caffeine.   Current Outpatient Prescriptions on File Prior to Visit  Medication Sig Dispense Refill  . acetaminophen (TYLENOL) 500 MG tablet Take 500 mg by mouth as needed for pain.    Marland Kitchen amLODipine (NORVASC) 5 MG tablet Take 1 tablet (5 mg total) by mouth daily. 90 tablet 3  . buPROPion (WELLBUTRIN) 75 MG tablet Take 75 mg by mouth daily.    . Calcium Citrate-Vitamin D 250-200 MG-UNIT TABS Take by mouth. Take two tabs daily    . cetirizine (ZYRTEC) 10 MG tablet Take 10 mg by mouth daily.    . Cinnamon 500 MG capsule Take 1,000 mg by mouth daily.    . clopidogrel (PLAVIX) 75 MG tablet Take 1 tablet (75 mg total) by mouth daily with breakfast. 30 tablet 3  . Coenzyme Q10 (CO Q 10) 100 MG CAPS Take 1 tablet by mouth daily. Hold while in hospital    . denosumab (PROLIA) 60 MG/ML SOLN injection Inject 60 mg into the skin every 6 (six) months. Administer in upper arm, thigh, or abdomen    . fenofibrate (TRICOR) 145 MG tablet Take 145 mg by mouth daily.    . fluticasone (FLONASE) 50 MCG/ACT nasal spray Place 2 sprays into the nose 2 (two) times daily.     . furosemide (LASIX) 20 MG tablet Take 1 tablet (20 mg total) by mouth daily. 30 tablet 3  . Glucosamine-Chondroitin (OSTEO BI-FLEX REGULAR STRENGTH) 250-200 MG TABS Take 1 tablet by mouth daily. Hold while in hospital    . losartan (COZAAR) 50 MG tablet     . Multiple Vitamins-Minerals (CENTRUM SILVER PO) Take by mouth daily.    Marland Kitchen oxybutynin (DITROPAN-XL) 5 MG 24 hr tablet Take 5 mg by mouth daily.    . pantoprazole (PROTONIX) 40 MG tablet Take 40 mg by mouth daily.    . ranitidine (ZANTAC) 300 MG tablet     . vitamin B-12 (CYANOCOBALAMIN) 1000 MCG tablet Take 1,000 mcg by mouth daily.     No current facility-administered medications on file prior to visit.   Allergies  Allergen Reactions   . Ace Inhibitors     unknown  . Codeine     unknown  . Levaquin [Levofloxacin]     unknown  . Penicillins     unknown  . Sulfa Antibiotics     unknown   Family History  Problem Relation Age of Onset  . Hypertension Father   . Hypertension Mother   . Cancer - Lung Brother    PE: BP 130/82 mmHg  Pulse 73  Temp(Src) 98.3 F (36.8 C) (  Oral)  Resp 16  Ht '4\' 9"'  (1.448 m)  Wt 173 lb (78.472 kg)  BMI 37.43 kg/m2  SpO2 98% Wt Readings from Last 3 Encounters:  06/02/15 173 lb (78.472 kg)  04/25/15 170 lb 9.6 oz (77.384 kg)  04/10/15 176 lb 11.2 oz (80.151 kg)   Constitutional: overweight, in NAD. +  kyphosis. Eyes: PERRLA, EOMI, no exophthalmos ENT: moist mucous membranes, no thyromegaly, no cervical lymphadenopathy Cardiovascular: RRR, No MRG Respiratory: CTA B Gastrointestinal: abdomen soft, NT, ND, BS+ Musculoskeletal: no deformities, strength intact in all 4 Skin: moist, warm, no rashes Neurological: no tremor with outstretched hands, DTR normal in all 4  Assessment: 1. Osteoporosis  Plan: 1. Osteoporosis - likely postmenopausal  - + 2013 humeral fx - No available DEXA scans that I could review - sent record request to PCP (per pt, they have her records from [place]). We will ask for records. - We discussed about the different medication classes, benefits and side effects (including atypical fractures and ONJ - no dental workup in progress or planned, but she had a jaw fx after instrumentation during Fosamax and was told it could have been due to the bisphosphonate.   - we discussed that she already had 4 years of Prolia and she tolerated this well >> I suggested to continue for up to 6 years (we cannot use bisphosphonates 2/2 decreased GFR). I don't think she is a candidate for Teriparatide at the moment.  - we reviewed her dietary and supplemental calcium and vitamin D intake - I recommended getting at least 2000 units vitamin D and at least 1200 mg of calcium  daily - given her specific instructions about food sources for these - see pt instructions. Will also check a vit D today. - discussed fall precautions   -- We will check the following tests:  Vitamin D  BMP  TSH - will arrange for Prolia - advised not to stop tx abruptly after Prolia as her BMD could decrease fast - will check a new DEXA scan - ordered at [facility]  Return in about 1 year (around 06/01/2016).  - time spent with the patient: 1 hour, of which >50% was spent in obtaining information about her osteoporosis reviewing her previous labs, evaluations, and treatments, counseling her about her condition (please see the discussed topics above), and developing a plan to further investigate it and treat it; she and her caregiver had a number of questions which I addressed.  Office Visit on 06/02/2015  Component Date Value Ref Range Status  . VITD 06/02/2015 15.79* 30.00 - 100.00 ng/mL Final  . Sodium 06/02/2015 143  135 - 146 mmol/L Final  . Potassium 06/02/2015 4.6  3.5 - 5.3 mmol/L Final  . Chloride 06/02/2015 105  98 - 110 mmol/L Final  . CO2 06/02/2015 25  20 - 31 mmol/L Final  . Glucose, Bld 06/02/2015 109* 65 - 99 mg/dL Final  . BUN 06/02/2015 17  7 - 25 mg/dL Final  . Creat 06/02/2015 1.09* 0.60 - 0.88 mg/dL Final  . Calcium 06/02/2015 9.1  8.6 - 10.4 mg/dL Final  . GFR, Est African American 06/02/2015 53* >=60 mL/min Final  . GFR, Est Non African American 06/02/2015 46* >=60 mL/min Final   Comment:   The estimated GFR is a calculation valid for adults (>=22 years old) that uses the CKD-EPI algorithm to adjust for age and sex. It is   not to be used for children, pregnant women, hospitalized patients,    patients  on dialysis, or with rapidly changing kidney function. According to the NKDEP, eGFR >89 is normal, 60-89 shows mild impairment, 30-59 shows moderate impairment, 15-29 shows severe impairment and <15 is ESRD.     Footnotes:  (1) ** Please note change in unit of  measure and reference range(s). **     . TSH 06/02/2015 1.48  0.35 - 4.50 uIU/mL Final   TSH normal. GFR improved from previous. We can restart Prolia. Vit D deficiency >> start Ergocalciferol 50,000 IU weekly for 8 weeks, then continue with 4000 units daily Cholecalciferol. Will need a repeat vit D level in 4 months.  DEXA pending.  Addendum - 06/07/2015: Received records from [place]: Prolia injections documented- no co-pay required: - 03/03/2015  - 09/14/2014   Latest DEXA scans were actually more recent than patient remembered: 08/01/2011 - [facility]):  L1-L3 (L4) T score: 0.0 RFN T score: -1.6 LFN T score: -1.7 FRAX score: MOF 12.6%, hip fracture risk 3.2% 05/28/2006 [facility]):  L1-L4 T score: -1.0 LFN T score: -1.3 1/3 distal forearm T score: -2.7 VFA was performed and was read as abnormal, however no details given in the report and the image quality is too poor for me to analyze.

## 2015-06-03 LAB — BASIC METABOLIC PANEL WITH GFR
BUN: 17 mg/dL (ref 7–25)
CALCIUM: 9.1 mg/dL (ref 8.6–10.4)
CO2: 25 mmol/L (ref 20–31)
Chloride: 105 mmol/L (ref 98–110)
Creat: 1.09 mg/dL — ABNORMAL HIGH (ref 0.60–0.88)
GFR, EST AFRICAN AMERICAN: 53 mL/min — AB (ref >=60)
GFR, EST NON AFRICAN AMERICAN: 46 mL/min — AB (ref >=60)
Glucose, Bld: 109 mg/dL — ABNORMAL HIGH (ref 65–99)
POTASSIUM: 4.6 mmol/L (ref 3.5–5.3)
SODIUM: 143 mmol/L (ref 135–146)

## 2015-06-05 ENCOUNTER — Encounter: Payer: Self-pay | Admitting: *Deleted

## 2015-06-05 ENCOUNTER — Other Ambulatory Visit: Payer: Self-pay | Admitting: *Deleted

## 2015-06-05 ENCOUNTER — Telehealth: Payer: Self-pay | Admitting: *Deleted

## 2015-06-05 LAB — VITAMIN D 25 HYDROXY (VIT D DEFICIENCY, FRACTURES): VITD: 15.79 ng/mL — AB (ref 30.00–100.00)

## 2015-06-05 MED ORDER — ERGOCALCIFEROL 1.25 MG (50000 UT) PO CAPS: 50000.0000 [IU] | ORAL_CAPSULE | ORAL | Status: DC

## 2015-06-05 NOTE — Telephone Encounter (Signed)
Rose, will you initiate a PA for Prolia inj for pt? Thank you!

## 2015-06-05 NOTE — Telephone Encounter (Signed)
Opened encounter in error  

## 2015-06-06 ENCOUNTER — Telehealth: Payer: Self-pay | Admitting: Internal Medicine

## 2015-06-06 NOTE — Telephone Encounter (Signed)
[facility] called this afternoon wanting to speak with Charlene Wade wanting to speak with her regarding her labs  She is confused   Please advise    Thank you

## 2015-06-06 NOTE — Telephone Encounter (Signed)
Called pt and read her lab results to her again and clarified the vitamin d dosage. Pt and caregiver voiced understanding.

## 2015-06-07 ENCOUNTER — Telehealth: Payer: Self-pay | Admitting: *Deleted

## 2015-06-07 NOTE — Telephone Encounter (Signed)
I have electronically submitted pt's info for Prolia insurance verification and will notify you once I have a response. Thank you. °

## 2015-06-07 NOTE — Telephone Encounter (Signed)
Scheduled pt's DEXA, Wednesday, Aug 31st at 3:00 pm (arrival time 2:45pm). Called and spoke with pt's caregiver. Advised her of the date and time. She voiced understanding. Be advised.

## 2015-06-21 DIAGNOSIS — L298 Other pruritus: Secondary | ICD-10-CM | POA: Diagnosis not present

## 2015-06-21 DIAGNOSIS — Z85828 Personal history of other malignant neoplasm of skin: Secondary | ICD-10-CM | POA: Diagnosis not present

## 2015-06-21 DIAGNOSIS — L918 Other hypertrophic disorders of the skin: Secondary | ICD-10-CM | POA: Diagnosis not present

## 2015-06-21 DIAGNOSIS — D225 Melanocytic nevi of trunk: Secondary | ICD-10-CM | POA: Diagnosis not present

## 2015-06-21 DIAGNOSIS — D485 Neoplasm of uncertain behavior of skin: Secondary | ICD-10-CM | POA: Diagnosis not present

## 2015-06-21 DIAGNOSIS — D3610 Benign neoplasm of peripheral nerves and autonomic nervous system, unspecified: Secondary | ICD-10-CM | POA: Diagnosis not present

## 2015-06-24 ENCOUNTER — Other Ambulatory Visit: Payer: Self-pay | Admitting: Family Medicine

## 2015-06-28 ENCOUNTER — Ambulatory Visit (INDEPENDENT_AMBULATORY_CARE_PROVIDER_SITE_OTHER)
Admission: RE | Admit: 2015-06-28 | Discharge: 2015-06-28 | Disposition: A | Discharge status: Home / Self Care | Payer: Medicare Other | Source: Ambulatory Visit | Attending: Internal Medicine | Admitting: Internal Medicine

## 2015-06-28 DIAGNOSIS — M810 Age-related osteoporosis without current pathological fracture: Secondary | ICD-10-CM

## 2015-06-30 ENCOUNTER — Encounter: Payer: Self-pay | Admitting: *Deleted

## 2015-07-10 ENCOUNTER — Other Ambulatory Visit: Payer: Self-pay | Admitting: Family Medicine

## 2015-07-14 ENCOUNTER — Telehealth: Payer: Self-pay | Admitting: Family Medicine

## 2015-07-14 NOTE — Telephone Encounter (Signed)
Pt care giver call to ask if pt should now go back to over the counter Vit d since her rx was only for about 6 weeks. Would like a call back

## 2015-07-14 NOTE — Telephone Encounter (Signed)
S/w pt and gave her the info

## 2015-07-14 NOTE — Telephone Encounter (Signed)
Yes, go back to 4000 units daily.

## 2015-07-19 ENCOUNTER — Telehealth: Payer: Self-pay | Admitting: Family Medicine

## 2015-07-19 NOTE — Telephone Encounter (Addendum)
Pt would like to know if she can get a new rx for 1/2 the furosemide (LASIX) 20 MG tablet. Pt states this is causing her to go to the bathroom too much and she wants to cut the dose in half. But the pills are too small to cut.  Would like new rx  Pt aware dr Maudie Mercury out this afternoon . Would like cb in the am. Only one tab left..  [facility] [address]

## 2015-07-20 NOTE — Telephone Encounter (Signed)
This medication does not come in a smaller the 20mg  dose to my knowledge. Ok to refill as prescribed. Will need appt if want to change medication.

## 2015-07-20 NOTE — Telephone Encounter (Signed)
Left a message for the pt to return my call.  

## 2015-07-20 NOTE — Telephone Encounter (Signed)
Patient informed of the message below and states she does want to come in for an appt.  States she will call back once she figures out transportation.

## 2015-07-21 DIAGNOSIS — M7582 Other shoulder lesions, left shoulder: Secondary | ICD-10-CM | POA: Diagnosis not present

## 2015-07-21 DIAGNOSIS — M1712 Unilateral primary osteoarthritis, left knee: Secondary | ICD-10-CM | POA: Diagnosis not present

## 2015-07-25 ENCOUNTER — Ambulatory Visit (INDEPENDENT_AMBULATORY_CARE_PROVIDER_SITE_OTHER): Payer: Medicare Other | Admitting: Family Medicine

## 2015-07-25 ENCOUNTER — Encounter: Payer: Self-pay | Admitting: Family Medicine

## 2015-07-25 VITALS — BP 132/68 | HR 66 | Temp 97.9°F | Ht <= 58 in | Wt 173.1 lb

## 2015-07-25 DIAGNOSIS — N3281 Overactive bladder: Secondary | ICD-10-CM

## 2015-07-25 DIAGNOSIS — E785 Hyperlipidemia, unspecified: Secondary | ICD-10-CM | POA: Diagnosis not present

## 2015-07-25 DIAGNOSIS — Z6838 Body mass index (BMI) 38.0-38.9, adult: Secondary | ICD-10-CM | POA: Diagnosis not present

## 2015-07-25 DIAGNOSIS — I10 Essential (primary) hypertension: Secondary | ICD-10-CM | POA: Diagnosis not present

## 2015-07-25 DIAGNOSIS — N183 Chronic kidney disease, stage 3 unspecified: Secondary | ICD-10-CM

## 2015-07-25 DIAGNOSIS — Z23 Encounter for immunization: Secondary | ICD-10-CM | POA: Diagnosis not present

## 2015-07-25 DIAGNOSIS — Z8673 Personal history of transient ischemic attack (TIA), and cerebral infarction without residual deficits: Secondary | ICD-10-CM | POA: Diagnosis not present

## 2015-07-25 DIAGNOSIS — F39 Unspecified mood [affective] disorder: Secondary | ICD-10-CM

## 2015-07-25 NOTE — Progress Notes (Signed)
Pre visit review using our clinic review tool, if applicable. No additional management support is needed unless otherwise documented below in the visit note. 

## 2015-07-25 NOTE — Patient Instructions (Addendum)
Medicare wellness visit in 4 months Flu shot  We recommend the following healthy lifestyle measures: - eat a healthy diet consisting of lots of vegetables, fruits, beans, nuts, seeds, healthy meats such as white chicken and fish and whole grains.  - avoid fried foods, fast food, processed foods, sodas, red meet and other fattening foods.  - get a least 150 minutes of aerobic exercise per week.

## 2015-07-25 NOTE — Progress Notes (Signed)
HPI:  Charlene Wade is a pleasant 81 yo F here for follow up:  HTN/HLD/Obesity/CKD: -meds: amlodipine 5mg  daily (decreased due to swelling 03/2015), fenofibrate, lasix 20mg , fish oil, losartan 50mg   -reports took potassium daily in the past -denies: HA, CP, SOB -on cholesterol medication in the past -hx of lower ext edema remotely and on lasix in the past for this too, reports bilat LE edema for > 15 years -denies: CP, SOB, DOE  GERD/PND: -pantoprazole, ranitidine -hx hiatal hernia -sees Dr. Senaida Ores for this -also saw ENT and allergist -denies: heartburn, dysphagia  Hx Stroke -sees neurologist, hx of some memory issues -TIA 01/30/12, R MCA -on plavix -stable per her report  OAB: -takes oxybutynin  -stable  OA/Chronic low back pain: -seeing ortho, recently had steroid inj in shoulder and knee per her report -hx of DDD, s/p remote disc surgery per her report  Depression: -started when son and husband died in 01/30/2004 -on wellbutrin 75 mg daily for this -stable, no SI  Allergic rhinitis: -takes zyrtec daily  Osteoporosis: -has been on prolia injections for several years -sees Dr. Cruzita Lederer now -denies any hx of fractures other then when fell on arm remotely  Hx of skin cancer on the nose, BCC: -sees dermatologist for skin checks  ROS: See pertinent positives and negatives per HPI. Denies fevers, unintentional weight loss, hearing or vision loss, chest pain, palpitations, struggling to breath, hemoptysis, melena, hematochezia, hematuria, falls, loc, si, thoughts of self harm  Past Medical History  Diagnosis Date  . Stroke     sees neurologist, hx memory loss, expressive aphasia  . Hypertension   . Hyperlipidemia   . GERD (gastroesophageal reflux disease)     managed by Dr. Senaida Ores, hx hiatal hernia and chronic PND  . OA (osteoarthritis)     knees and back, hx DDD, s/p remote lumbar disc surgery  . Osteoporosis     on prolia in the past  . Depression 10/17/2012  .  BCC (basal cell carcinoma of skin) 04/10/2015    sees dermatologist  . OAB (overactive bladder) 04/10/2015  . Ectopic pregnancy   . Allergic rhinitis   . Obesity   . Venous insufficiency     Past Surgical History  Procedure Laterality Date  . Abdominal hysterectomy    . Breast reduction surgery    . Laparoscopic salpingoopherectomy    . Appendectomy    . Nasal mass excision      Basal Cell Cancer Excision  . Nasal reconstruction    . Tee without cardioversion  10/22/2012    Procedure: TRANSESOPHAGEAL ECHOCARDIOGRAM (TEE);  Surgeon: Josue Hector, MD;  Location: [facility] ENDOSCOPY;  Service: Cardiovascular;  Laterality: N/A;    Family History  Problem Relation Age of Onset  . Hypertension Father   . Hypertension Mother   . Cancer - Lung Brother     Social History   Social History  . Marital Status: Widowed    Spouse Name: N/A  . Number of Children: 2  . Years of Education: 12   Occupational History  . retired    Social History Main Topics  . Smoking status: Former Research scientist (life sciences)  . Smokeless tobacco: Never Used  . Alcohol Use: No  . Drug Use: No  . Sexual Activity: No   Other Topics Concern  . None   Social History Narrative   Patient is single with 2 children.   Patient is right handed.   Patient has 12 th grade education.   Patient does  not drink caffeine.     Current outpatient prescriptions:  .  acetaminophen (TYLENOL) 500 MG tablet, Take 500 mg by mouth as needed for pain., Disp: , Rfl:  .  amLODipine (NORVASC) 5 MG tablet, Take 1 tablet (5 mg total) by mouth daily., Disp: 90 tablet, Rfl: 3 .  buPROPion (WELLBUTRIN) 75 MG tablet, take 1 tablet by mouth once daily with EVENING MEAL, Disp: 90 tablet, Rfl: 4 .  Calcium Citrate-Vitamin D 250-200 MG-UNIT TABS, Take by mouth. Take two tabs daily, Disp: , Rfl:  .  cetirizine (ZYRTEC) 10 MG tablet, Take 10 mg by mouth daily., Disp: , Rfl:  .  Cinnamon 500 MG capsule, Take 1,000 mg by mouth daily., Disp: , Rfl:  .   clopidogrel (PLAVIX) 75 MG tablet, Take 1 tablet (75 mg total) by mouth daily with breakfast., Disp: 30 tablet, Rfl: 3 .  Coenzyme Q10 (CO Q 10) 100 MG CAPS, Take 1 tablet by mouth daily. Hold while in hospital, Disp: , Rfl:  .  denosumab (PROLIA) 60 MG/ML SOLN injection, Inject 60 mg into the skin every 6 (six) months. Administer in upper arm, thigh, or abdomen, Disp: , Rfl:  .  ergocalciferol (VITAMIN D2) 50000 UNITS capsule, Take 1 capsule (50,000 Units total) by mouth once a week., Disp: 8 capsule, Rfl: 0 .  fenofibrate (TRICOR) 145 MG tablet, Take 145 mg by mouth daily., Disp: , Rfl:  .  fluticasone (FLONASE) 50 MCG/ACT nasal spray, instill 1 spray into each nostril twice a day, Disp: 16 g, Rfl: 11 .  furosemide (LASIX) 20 MG tablet, Take 1 tablet (20 mg total) by mouth daily., Disp: 30 tablet, Rfl: 3 .  Glucosamine-Chondroitin (OSTEO BI-FLEX REGULAR STRENGTH) 250-200 MG TABS, Take 1 tablet by mouth daily. Hold while in hospital, Disp: , Rfl:  .  losartan (COZAAR) 50 MG tablet, , Disp: , Rfl:  .  Multiple Vitamins-Minerals (CENTRUM SILVER PO), Take by mouth daily., Disp: , Rfl:  .  oxybutynin (DITROPAN-XL) 5 MG 24 hr tablet, Take 5 mg by mouth daily., Disp: , Rfl:  .  pantoprazole (PROTONIX) 40 MG tablet, Take 40 mg by mouth daily., Disp: , Rfl:  .  ranitidine (ZANTAC) 300 MG tablet, , Disp: , Rfl:  .  vitamin B-12 (CYANOCOBALAMIN) 1000 MCG tablet, Take 1,000 mcg by mouth daily., Disp: , Rfl:   EXAM:  Filed Vitals:   07/25/15 1515  BP: 132/68  Pulse: 66  Temp: 97.9 F (36.6 C)    Body mass index is 37.45 kg/(m^2).  GENERAL: vitals reviewed and listed above, alert, oriented, appears well hydrated and in no acute distress  HEENT: atraumatic, conjunttiva clear, no obvious abnormalities on inspection of external nose and ears  NECK: no obvious masses on inspection  LUNGS: clear to auscultation bilaterally, no wheezes, rales or rhonchi, good air movement  CV: HRRR, no peripheral  edema  MS: moves all extremities without noticeable abnormality  PSYCH: pleasant and cooperative, no obvious depression or anxiety  ASSESSMENT AND PLAN:  Discussed the following assessment and plan:  H/O: CVA (cerebrovascular accident)  Essential hypertension  Hyperlipidemia  BMI 38.0-38.9,adult  Mood disorder  CKD (chronic kidney disease) stage 3, GFR 30-59 ml/min  OAB (overactive bladder)  -stable, renal function checked recently -Patient advised to return or notify a doctor immediately if symptoms worsen or persist or new concerns arise.  Patient Instructions  Medicare wellness visit in 4 months Flu shot  We recommend the following healthy lifestyle measures: - eat a  healthy diet consisting of lots of vegetables, fruits, beans, nuts, seeds, healthy meats such as white chicken and fish and whole grains.  - avoid fried foods, fast food, processed foods, sodas, red meet and other fattening foods.  - get a least 150 minutes of aerobic exercise per week.       Colin Benton R.

## 2015-07-31 DIAGNOSIS — M25512 Pain in left shoulder: Secondary | ICD-10-CM | POA: Diagnosis not present

## 2015-08-02 ENCOUNTER — Telehealth: Payer: Self-pay | Admitting: Family Medicine

## 2015-08-02 NOTE — Telephone Encounter (Signed)
I would advise this be rxd by her gastroenterologist as is a high dose medication reserved for significant acid reflux.

## 2015-08-02 NOTE — Telephone Encounter (Signed)
Pt request refill of the following: pantoprazole (PROTONIX) 40 MG tablet  Dr Maudie Mercury has not rx this for the pt but is asking if she will     Phamacy: [facility]

## 2015-08-03 NOTE — Telephone Encounter (Signed)
I left a detailed message with the information below at the pts home number. 

## 2015-08-04 ENCOUNTER — Telehealth: Payer: Self-pay | Admitting: *Deleted

## 2015-08-04 ENCOUNTER — Other Ambulatory Visit: Payer: Self-pay

## 2015-08-04 MED ORDER — PANTOPRAZOLE SODIUM 40 MG PO TBEC: 40.0000 mg | DELAYED_RELEASE_TABLET | Freq: Every day | ORAL | Status: DC

## 2015-08-04 NOTE — Telephone Encounter (Signed)
Patient contacted the pharmacy for Protonix, she didn't have anymore refills, She would like to know can she have another refill.  Please Advise.

## 2015-08-04 NOTE — Telephone Encounter (Signed)
Spoke to patient and informed her that I sent the script to the pharmacy.

## 2015-08-04 NOTE — Telephone Encounter (Signed)
Patient left a message on my voicemail wanting to know why Protonix was not refilled by Dr Maudie Mercury.  I explained to the pt per Dr Maudie Mercury she recommended refills for this medication be given by her gastroenterologist as this is used for severe reflux and she said she still did not understand why Dr Maudie Mercury would not prescribe this and I explained this again and she said never mind as someone that was with her told her it was already sent to her pharmacy.  I advised she contact Dr Penelope Coop in the future for refills as this doctor is listed as her GI.

## 2015-08-08 ENCOUNTER — Other Ambulatory Visit: Payer: Self-pay | Admitting: Family Medicine

## 2015-08-14 ENCOUNTER — Telehealth: Payer: Self-pay | Admitting: Internal Medicine

## 2015-08-14 NOTE — Telephone Encounter (Signed)
We originally ran an Pharmacist, community for Prolia in August of this year, however, pt was not due her Prolia injection until November.  I have electronically re-submitted her info for up to date, current insurance verification and will notify you once I have a response.  Thank you.

## 2015-08-14 NOTE — Telephone Encounter (Signed)
Thank you, Rose.

## 2015-08-21 NOTE — Telephone Encounter (Signed)
I have rec'd pt's insurance verification for Prolia and her secondary [employer] required a prior authorization, which I completed verbally this morning.  Per Nenita at [facility], [place] is approved for 24 months, effective 08/21/2015-08/19/2017. The p/a # is [id number].  Pt will have an estimated responsibility of $20.  Please make her aware this is only an estimate and we will not know an exact amt insurance(s) have paid.  I have sent a copy of the summary of benefits to be scanned into her chart.  Please let me know if you have any questions.  Thank you.

## 2015-08-22 ENCOUNTER — Encounter: Payer: Self-pay | Admitting: Family Medicine

## 2015-08-22 ENCOUNTER — Encounter: Payer: Medicare Other | Admitting: Family Medicine

## 2015-08-22 VITALS — BP 136/72 | HR 88 | Temp 97.5°F | Wt 170.0 lb

## 2015-08-22 DIAGNOSIS — K219 Gastro-esophageal reflux disease without esophagitis: Secondary | ICD-10-CM

## 2015-08-22 MED ORDER — PANTOPRAZOLE SODIUM 20 MG PO TBEC: 20.0000 mg | DELAYED_RELEASE_TABLET | Freq: Every day | ORAL | Status: DC

## 2015-08-22 NOTE — Addendum Note (Signed)
Addended by: Lucretia Kern on: 08/22/2015 03:46 PM   Modules accepted: Level of Service, SmartSet

## 2015-08-22 NOTE — Patient Instructions (Signed)
Continue Zantac  Protonix 20 mg daily as needed for reflux or post nasal drip  Small meals  Food Choices for Gastroesophageal Reflux Disease, Adult When you have gastroesophageal reflux disease (GERD), the foods you eat and your eating habits are very important. Choosing the right foods can help ease the discomfort of GERD. WHAT GENERAL GUIDELINES DO I NEED TO FOLLOW?  Choose fruits, vegetables, whole grains, low-fat dairy products, and low-fat meat, fish, and poultry.  Limit fats such as oils, salad dressings, butter, nuts, and avocado.  Keep a food diary to identify foods that cause symptoms.  Avoid foods that cause reflux. These may be different for different people.  Eat frequent small meals instead of three large meals each day.  Eat your meals slowly, in a relaxed setting.  Limit fried foods.  Cook foods using methods other than frying.  Avoid drinking alcohol.  Avoid drinking large amounts of liquids with your meals.  Avoid bending over or lying down until 2-3 hours after eating. WHAT FOODS ARE NOT RECOMMENDED? The following are some foods and drinks that may worsen your symptoms: Vegetables Tomatoes. Tomato juice. Tomato and spaghetti sauce. Chili peppers. Onion and garlic. Horseradish. Fruits Oranges, grapefruit, and lemon (fruit and juice). Meats High-fat meats, fish, and poultry. This includes hot dogs, ribs, ham, sausage, salami, and bacon. Dairy Whole milk and chocolate milk. Sour cream. Cream. Butter. Ice cream. Cream cheese.  Beverages Coffee and tea, with or without caffeine. Carbonated beverages or energy drinks. Condiments Hot sauce. Barbecue sauce.  Sweets/Desserts Chocolate and cocoa. Donuts. Peppermint and spearmint. Fats and Oils High-fat foods, including [place] fries and potato chips. Other Vinegar. Strong spices, such as black pepper, white pepper, red pepper, cayenne, curry powder, cloves, ginger, and chili powder. The items listed above may  not be a complete list of foods and beverages to avoid. Contact your dietitian for more information.   This information is not intended to replace advice given to you by your health care provider. Make sure you discuss any questions you have with your health care provider.   Document Released: 10/14/2005 Document Revised: 11/04/2014 Document Reviewed: 08/18/2013 Elsevier Interactive Patient Education [employer].

## 2015-08-22 NOTE — Addendum Note (Signed)
Addended by: Lucretia Kern on: 08/22/2015 03:32 PM   Modules accepted: Orders

## 2015-08-22 NOTE — Progress Notes (Addendum)
HPI:  Acute visit for:  GERD: -has seen Dr. Senaida Ores (GI), allergist and ENT for this and dx with silent reflux  -symptoms occ reflux, PND, cough -hx hiatal hernia -on protonix 40mg  daily and zantac 300mg  daily -denies: dysphagia, hx esophagitis, hx stricture -allergist used to rx her PPI but has not returned her calls  ROS: See pertinent positives and negatives per HPI.  Past Medical History  Diagnosis Date  . Stroke [facility])     sees neurologist, hx memory loss, expressive aphasia  . Hypertension   . Hyperlipidemia   . GERD (gastroesophageal reflux disease)     hx hiatal hernia, has seen GI, ENT and allergist in the past  . OA (osteoarthritis)     knees and back, hx DDD, s/p remote lumbar disc surgery  . Osteoporosis     on prolia in the past  . Depression 10/17/2012  . BCC (basal cell carcinoma of skin) 04/10/2015    sees dermatologist  . OAB (overactive bladder) 04/10/2015  . Ectopic pregnancy   . Allergic rhinitis   . Obesity   . Venous insufficiency     Past Surgical History  Procedure Laterality Date  . Abdominal hysterectomy    . Breast reduction surgery    . Laparoscopic salpingoopherectomy    . Appendectomy    . Nasal mass excision      Basal Cell Cancer Excision  . Nasal reconstruction    . Tee without cardioversion  10/22/2012    Procedure: TRANSESOPHAGEAL ECHOCARDIOGRAM (TEE);  Surgeon: Josue Hector, MD;  Location: [facility] ENDOSCOPY;  Service: Cardiovascular;  Laterality: N/A;    Family History  Problem Relation Age of Onset  . Hypertension Father   . Hypertension Mother   . Cancer - Lung Brother     Social History   Social History  . Marital Status: Widowed    Spouse Name: N/A  . Number of Children: 2  . Years of Education: 12   Occupational History  . retired    Social History Main Topics  . Smoking status: Former Research scientist (life sciences)  . Smokeless tobacco: Never Used  . Alcohol Use: No  . Drug Use: No  . Sexual Activity: No   Other Topics Concern  .  Not on file   Social History Narrative   Patient is single with 2 children.   Patient is right handed.   Patient has 12 th grade education.   Patient does not drink caffeine.     Current outpatient prescriptions:  .  acetaminophen (TYLENOL) 500 MG tablet, Take 500 mg by mouth as needed for pain., Disp: , Rfl:  .  amLODipine (NORVASC) 5 MG tablet, Take 1 tablet (5 mg total) by mouth daily., Disp: 90 tablet, Rfl: 3 .  buPROPion (WELLBUTRIN) 75 MG tablet, take 1 tablet by mouth once daily with EVENING MEAL, Disp: 90 tablet, Rfl: 4 .  Calcium Citrate-Vitamin D 250-200 MG-UNIT TABS, Take by mouth. Take two tabs daily, Disp: , Rfl:  .  cetirizine (ZYRTEC) 10 MG tablet, Take 10 mg by mouth daily., Disp: , Rfl:  .  Cinnamon 500 MG capsule, Take 1,000 mg by mouth daily., Disp: , Rfl:  .  clopidogrel (PLAVIX) 75 MG tablet, Take 1 tablet (75 mg total) by mouth daily with breakfast., Disp: 30 tablet, Rfl: 3 .  Coenzyme Q10 (CO Q 10) 100 MG CAPS, Take 1 tablet by mouth daily. Hold while in hospital, Disp: , Rfl:  .  denosumab (PROLIA) 60 MG/ML SOLN injection, Inject  60 mg into the skin every 6 (six) months. Administer in upper arm, thigh, or abdomen, Disp: , Rfl:  .  ergocalciferol (VITAMIN D2) 50000 UNITS capsule, Take 1 capsule (50,000 Units total) by mouth once a week., Disp: 8 capsule, Rfl: 0 .  fenofibrate (TRICOR) 145 MG tablet, take 1 tablet by mouth once daily, Disp: 90 tablet, Rfl: 1 .  fluticasone (FLONASE) 50 MCG/ACT nasal spray, instill 1 spray into each nostril twice a day, Disp: 16 g, Rfl: 11 .  furosemide (LASIX) 20 MG tablet, Take 1 tablet (20 mg total) by mouth daily., Disp: 30 tablet, Rfl: 3 .  Glucosamine-Chondroitin (OSTEO BI-FLEX REGULAR STRENGTH) 250-200 MG TABS, Take 1 tablet by mouth daily. Hold while in hospital, Disp: , Rfl:  .  losartan (COZAAR) 50 MG tablet, , Disp: , Rfl:  .  Multiple Vitamins-Minerals (CENTRUM SILVER PO), Take by mouth daily., Disp: , Rfl:  .  oxybutynin  (DITROPAN-XL) 5 MG 24 hr tablet, Take 5 mg by mouth daily., Disp: , Rfl:  .  pantoprazole (PROTONIX) 20 MG tablet, Take 1 tablet (20 mg total) by mouth daily., Disp: 90 tablet, Rfl: 3 .  ranitidine (ZANTAC) 300 MG tablet, , Disp: , Rfl:  .  vitamin B-12 (CYANOCOBALAMIN) 1000 MCG tablet, Take 1,000 mcg by mouth daily., Disp: , Rfl:   EXAM:  Filed Vitals:   08/22/15 1542  BP: 136/72  Pulse: 88  Temp: 97.5 F (36.4 C)    Body mass index is 36.78 kg/(m^2).  GENERAL: vitals reviewed and listed above, alert, oriented, appears well hydrated and in no acute distress  HEENT: atraumatic, conjunttiva clear, no obvious abnormalities on inspection of external nose and ears  NECK: no obvious masses on inspection  LUNGS: clear to auscultation bilaterally, no wheezes, rales or rhonchi, good air movement  CV: HRRR, no peripheral edema  MS: moves all extremities without noticeable abnormality  PSYCH: pleasant and cooperative, no obvious depression or anxiety  ASSESSMENT AND PLAN:  Discussed the following assessment and plan:  Gastroesophageal reflux disease, esophagitis presence not specified  -opted to try lower dose of PPI after discussing risks and try to use prn rather then daily  NOTE: MEMORY LOSS: -daughter found me after visit and reports slowly progressing memory issues with pt for several year, worsening gradually over the last 6 months. Reports she is concerned for mild dementia, but pt denies it and so she did not want to embarrass pt by discussing in the room. She is not sure how to proceed. We opted to do a standard cognitive screen at a close follow up visit then bring awareness to pt and consider further eval as needed.  -Patient advised to return or notify a doctor immediately if symptoms worsen or persist or new concerns arise.  Patient Instructions  Continue Zantac  Protonix 20 mg daily as needed for reflux or post nasal drip  Small meals  Food Choices for  Gastroesophageal Reflux Disease, Adult When you have gastroesophageal reflux disease (GERD), the foods you eat and your eating habits are very important. Choosing the right foods can help ease the discomfort of GERD. WHAT GENERAL GUIDELINES DO I NEED TO FOLLOW?  Choose fruits, vegetables, whole grains, low-fat dairy products, and low-fat meat, fish, and poultry.  Limit fats such as oils, salad dressings, butter, nuts, and avocado.  Keep a food diary to identify foods that cause symptoms.  Avoid foods that cause reflux. These may be different for different people.  Eat frequent small meals  instead of three large meals each day.  Eat your meals slowly, in a relaxed setting.  Limit fried foods.  Cook foods using methods other than frying.  Avoid drinking alcohol.  Avoid drinking large amounts of liquids with your meals.  Avoid bending over or lying down until 2-3 hours after eating. WHAT FOODS ARE NOT RECOMMENDED? The following are some foods and drinks that may worsen your symptoms: Vegetables Tomatoes. Tomato juice. Tomato and spaghetti sauce. Chili peppers. Onion and garlic. Horseradish. Fruits Oranges, grapefruit, and lemon (fruit and juice). Meats High-fat meats, fish, and poultry. This includes hot dogs, ribs, ham, sausage, salami, and bacon. Dairy Whole milk and chocolate milk. Sour cream. Cream. Butter. Ice cream. Cream cheese.  Beverages Coffee and tea, with or without caffeine. Carbonated beverages or energy drinks. Condiments Hot sauce. Barbecue sauce.  Sweets/Desserts Chocolate and cocoa. Donuts. Peppermint and spearmint. Fats and Oils High-fat foods, including [place] fries and potato chips. Other Vinegar. Strong spices, such as black pepper, white pepper, red pepper, cayenne, curry powder, cloves, ginger, and chili powder. The items listed above may not be a complete list of foods and beverages to avoid. Contact your dietitian for more information.   This  information is not intended to replace advice given to you by your health care provider. Make sure you discuss any questions you have with your health care provider.   Document Released: 10/14/2005 Document Revised: 11/04/2014 Document Reviewed: 08/18/2013 Elsevier Interactive Patient Education 2016 [place], [place]

## 2015-08-23 ENCOUNTER — Other Ambulatory Visit: Payer: Self-pay | Admitting: *Deleted

## 2015-08-23 ENCOUNTER — Telehealth: Payer: Self-pay | Admitting: Internal Medicine

## 2015-08-23 NOTE — Telephone Encounter (Signed)
Pt states she is due to prolia in November, can we be sure the PA is done and we can call to schedule

## 2015-08-23 NOTE — Telephone Encounter (Signed)
Called pt and scheduled inj.

## 2015-08-23 NOTE — Telephone Encounter (Signed)
Called pt and spoke with pt's caregiver. Advised her that the pt's estimated responsibility is $20 for the inj. She voiced understanding. Scheduled pt's Prolia inj for 09/13/15 at 3:00 pm.

## 2015-08-25 ENCOUNTER — Telehealth: Payer: Self-pay | Admitting: Family Medicine

## 2015-08-25 MED ORDER — PANTOPRAZOLE SODIUM 20 MG PO TBEC: 20.0000 mg | DELAYED_RELEASE_TABLET | Freq: Every day | ORAL | Status: DC

## 2015-08-25 NOTE — Telephone Encounter (Signed)
pharm called because they never received   pantoprazole (PROTONIX) 20 MG tablet  On the 25th of October  Can you resend asap? Pt is out.  [facility] / [address]

## 2015-08-25 NOTE — Telephone Encounter (Signed)
Rx resent.

## 2015-09-13 ENCOUNTER — Ambulatory Visit: Payer: Medicare Other

## 2015-09-19 ENCOUNTER — Other Ambulatory Visit (INDEPENDENT_AMBULATORY_CARE_PROVIDER_SITE_OTHER): Payer: Medicare Other | Admitting: *Deleted

## 2015-09-19 ENCOUNTER — Ambulatory Visit (INDEPENDENT_AMBULATORY_CARE_PROVIDER_SITE_OTHER): Payer: Medicare Other | Admitting: *Deleted

## 2015-09-19 DIAGNOSIS — M810 Age-related osteoporosis without current pathological fracture: Secondary | ICD-10-CM | POA: Diagnosis not present

## 2015-09-19 MED ORDER — DENOSUMAB 60 MG/ML ~~LOC~~ SOLN
60.0000 mg | Freq: Once | SUBCUTANEOUS | Status: AC
  Administered 2015-09-19: 60 mg via SUBCUTANEOUS

## 2015-09-25 ENCOUNTER — Encounter: Payer: Self-pay | Admitting: Family Medicine

## 2015-09-25 ENCOUNTER — Ambulatory Visit (INDEPENDENT_AMBULATORY_CARE_PROVIDER_SITE_OTHER): Payer: Medicare Other | Admitting: Family Medicine

## 2015-09-25 VITALS — BP 130/68 | HR 75 | Temp 97.4°F | Wt 171.1 lb

## 2015-09-25 DIAGNOSIS — I10 Essential (primary) hypertension: Secondary | ICD-10-CM

## 2015-09-25 DIAGNOSIS — K219 Gastro-esophageal reflux disease without esophagitis: Secondary | ICD-10-CM | POA: Diagnosis not present

## 2015-09-25 DIAGNOSIS — Z8673 Personal history of transient ischemic attack (TIA), and cerebral infarction without residual deficits: Secondary | ICD-10-CM

## 2015-09-25 DIAGNOSIS — E785 Hyperlipidemia, unspecified: Secondary | ICD-10-CM

## 2015-09-25 DIAGNOSIS — N183 Chronic kidney disease, stage 3 unspecified: Secondary | ICD-10-CM

## 2015-09-25 DIAGNOSIS — F39 Unspecified mood [affective] disorder: Secondary | ICD-10-CM

## 2015-09-25 DIAGNOSIS — J309 Allergic rhinitis, unspecified: Secondary | ICD-10-CM

## 2015-09-25 NOTE — Progress Notes (Signed)
HPI:  HPI:  Charlene Wade is a pleasant 81 yo F here for follow up:  HTN/HLD/Obesity/CKD: -meds: amlodipine 5mg  daily (decreased due to swelling 03/2015), fenofibrate, lasix 20mg , fish oil, losartan 50mg   -reports took potassium daily in the past -denies: HA, CP, SOB -on cholesterol medication in the past -hx of lower ext edema remotely and on lasix in the past for this too, reports bilat LE edema for > 15 years -denies: CP, SOB, DOE  GERD/PND: -pantoprazole, ranitidine -hx hiatal hernia -sees Dr. Senaida Ores for this but wants me to refill PPI - risks discussed, she wants to do low dose -also saw ENT and allergist -denies: heartburn, dysphagia  Hx Stroke -sees neurologist, hx of some memory issues -TIA 01/26/12, R MCA -on plavix -stable per her report  OAB: -takes oxybutynin  -stable  OA/Chronic low back pain: -seeing ortho, recently had steroid inj in shoulder and knee per her report -hx of DDD, s/p remote disc surgery per her report  Depression: -started when son and husband died in 2004-01-26 -on wellbutrin 75 mg daily for this -stable, no SI  Allergic rhinitis: -takes zyrtec daily      ROS: See pertinent positives and negatives per HPI.  Past Medical History  Diagnosis Date  . Stroke [facility])     sees neurologist, hx memory loss, expressive aphasia  . Hypertension   . Hyperlipidemia   . GERD (gastroesophageal reflux disease)     hx hiatal hernia, has seen GI, ENT and allergist in the past  . OA (osteoarthritis)     knees and back, hx DDD, s/p remote lumbar disc surgery  . Osteoporosis     on prolia in the past  . Depression 10/17/2012  . BCC (basal cell carcinoma of skin) 04/10/2015    sees dermatologist  . OAB (overactive bladder) 04/10/2015  . Ectopic pregnancy   . Allergic rhinitis   . Obesity   . Venous insufficiency     Past Surgical History  Procedure Laterality Date  . Abdominal hysterectomy    . Breast reduction surgery    . Laparoscopic  salpingoopherectomy    . Appendectomy    . Nasal mass excision      Basal Cell Cancer Excision  . Nasal reconstruction    . Tee without cardioversion  10/22/2012    Procedure: TRANSESOPHAGEAL ECHOCARDIOGRAM (TEE);  Surgeon: Josue Hector, MD;  Location: [facility] ENDOSCOPY;  Service: Cardiovascular;  Laterality: N/A;    Family History  Problem Relation Age of Onset  . Hypertension Father   . Hypertension Mother   . Cancer - Lung Brother     Social History   Social History  . Marital Status: Widowed    Spouse Name: N/A  . Number of Children: 2  . Years of Education: 12   Occupational History  . retired    Social History Main Topics  . Smoking status: Former Research scientist (life sciences)  . Smokeless tobacco: Never Used  . Alcohol Use: No  . Drug Use: No  . Sexual Activity: No   Other Topics Concern  . None   Social History Narrative   Patient is single with 2 children.   Patient is right handed.   Patient has 12 th grade education.   Patient does not drink caffeine.     Current outpatient prescriptions:  .  acetaminophen (TYLENOL) 500 MG tablet, Take 500 mg by mouth as needed for pain., Disp: , Rfl:  .  amLODipine (NORVASC) 5 MG tablet, Take 1 tablet (5  mg total) by mouth daily., Disp: 90 tablet, Rfl: 3 .  buPROPion (WELLBUTRIN) 75 MG tablet, take 1 tablet by mouth once daily with EVENING MEAL, Disp: 90 tablet, Rfl: 4 .  Calcium Citrate-Vitamin D 250-200 MG-UNIT TABS, Take by mouth. Take two tabs daily, Disp: , Rfl:  .  cetirizine (ZYRTEC) 10 MG tablet, Take 10 mg by mouth daily., Disp: , Rfl:  .  Cinnamon 500 MG capsule, Take 1,000 mg by mouth daily., Disp: , Rfl:  .  clopidogrel (PLAVIX) 75 MG tablet, Take 1 tablet (75 mg total) by mouth daily with breakfast., Disp: 30 tablet, Rfl: 3 .  Coenzyme Q10 (CO Q 10) 100 MG CAPS, Take 1 tablet by mouth daily. Hold while in hospital, Disp: , Rfl:  .  denosumab (PROLIA) 60 MG/ML SOLN injection, Inject 60 mg into the skin every 6 (six) months.  Administer in upper arm, thigh, or abdomen, Disp: , Rfl:  .  ergocalciferol (VITAMIN D2) 50000 UNITS capsule, Take 1 capsule (50,000 Units total) by mouth once a week., Disp: 8 capsule, Rfl: 0 .  fenofibrate (TRICOR) 145 MG tablet, take 1 tablet by mouth once daily, Disp: 90 tablet, Rfl: 1 .  fluticasone (FLONASE) 50 MCG/ACT nasal spray, instill 1 spray into each nostril twice a day, Disp: 16 g, Rfl: 11 .  furosemide (LASIX) 20 MG tablet, Take 1 tablet (20 mg total) by mouth daily., Disp: 30 tablet, Rfl: 3 .  Glucosamine-Chondroitin (OSTEO BI-FLEX REGULAR STRENGTH) 250-200 MG TABS, Take 1 tablet by mouth daily. Hold while in hospital, Disp: , Rfl:  .  losartan (COZAAR) 50 MG tablet, , Disp: , Rfl:  .  Multiple Vitamins-Minerals (CENTRUM SILVER PO), Take by mouth daily., Disp: , Rfl:  .  oxybutynin (DITROPAN-XL) 5 MG 24 hr tablet, Take 5 mg by mouth daily., Disp: , Rfl:  .  pantoprazole (PROTONIX) 20 MG tablet, Take 1 tablet (20 mg total) by mouth daily., Disp: 90 tablet, Rfl: 3 .  ranitidine (ZANTAC) 300 MG tablet, , Disp: , Rfl:  .  vitamin B-12 (CYANOCOBALAMIN) 1000 MCG tablet, Take 1,000 mcg by mouth daily., Disp: , Rfl:   EXAM:  Filed Vitals:   09/25/15 1500  BP: 130/68  Pulse: 75  Temp: 97.4 F (36.3 C)    Body mass index is 37.02 kg/(m^2).  GENERAL: vitals reviewed and listed above, alert, oriented, appears well hydrated and in no acute distress  HEENT: atraumatic, conjunttiva clear, no obvious abnormalities on inspection of external nose and ears  NECK: no obvious masses on inspection  LUNGS: clear to auscultation bilaterally, no wheezes, rales or rhonchi, good air movement  CV: HRRR, no peripheral edema  MS: moves all extremities without noticeable abnormality  PSYCH: pleasant and cooperative, no obvious depression or anxiety, mini mental status exam 2/3 words, clock normal  ASSESSMENT AND PLAN:  Discussed the following assessment and plan:  H/O: CVA  (cerebrovascular accident)  Essential hypertension  Hyperlipidemia  Gastroesophageal reflux disease, esophagitis presence not specified  Mood disorder (HCC)  CKD (chronic kidney disease) stage 3, GFR 30-59 ml/min  Allergic rhinitis, unspecified allergic rhinitis type  Advised eval with her neurologist regarding what she reports as mild memory issues.  Is scheduled for medicare annual wellness visit and will plan labs then. Low dose PPI for now Patient advised to return or notify a doctor immediately if symptoms worsen or persist or new concerns arise.  Patient Instructions  Follow up as scheduled - we will plan to do labs then  Please see your neurologist about memory concerns  Ok to not take singulair  Please notify your pharmacy of new doctor and to send refill request here     Lucretia Kern.

## 2015-09-25 NOTE — Patient Instructions (Addendum)
Follow up as scheduled - we will plan to do labs then  Please see your neurologist about memory concerns  Ok to not take singulair  Please notify your pharmacy of new doctor and to send refill request here

## 2015-09-25 NOTE — Progress Notes (Signed)
Pre visit review using our clinic review tool, if applicable. No additional management support is needed unless otherwise documented below in the visit note. 

## 2015-10-02 ENCOUNTER — Other Ambulatory Visit (INDEPENDENT_AMBULATORY_CARE_PROVIDER_SITE_OTHER): Payer: Medicare Other

## 2015-10-02 DIAGNOSIS — M810 Age-related osteoporosis without current pathological fracture: Secondary | ICD-10-CM

## 2015-10-02 LAB — VITAMIN D 25 HYDROXY (VIT D DEFICIENCY, FRACTURES): VITD: 34.96 ng/mL (ref 30.00–100.00)

## 2015-10-03 ENCOUNTER — Other Ambulatory Visit: Payer: Medicare Other

## 2015-11-15 DIAGNOSIS — M1712 Unilateral primary osteoarthritis, left knee: Secondary | ICD-10-CM | POA: Diagnosis not present

## 2015-11-20 ENCOUNTER — Other Ambulatory Visit: Payer: Self-pay | Admitting: *Deleted

## 2015-11-20 MED ORDER — LOSARTAN POTASSIUM 50 MG PO TABS: 50.0000 mg | ORAL_TABLET | Freq: Every day | ORAL | Status: DC

## 2015-11-21 ENCOUNTER — Ambulatory Visit (INDEPENDENT_AMBULATORY_CARE_PROVIDER_SITE_OTHER): Payer: Medicare Other | Admitting: Family Medicine

## 2015-11-21 ENCOUNTER — Encounter: Payer: Medicare Other | Admitting: Family Medicine

## 2015-11-21 ENCOUNTER — Encounter: Payer: Self-pay | Admitting: Family Medicine

## 2015-11-21 VITALS — BP 138/70 | HR 62 | Temp 97.9°F | Ht <= 58 in | Wt 171.0 lb

## 2015-11-21 DIAGNOSIS — I10 Essential (primary) hypertension: Secondary | ICD-10-CM

## 2015-11-21 DIAGNOSIS — N183 Chronic kidney disease, stage 3 unspecified: Secondary | ICD-10-CM

## 2015-11-21 DIAGNOSIS — Z8673 Personal history of transient ischemic attack (TIA), and cerebral infarction without residual deficits: Secondary | ICD-10-CM

## 2015-11-21 DIAGNOSIS — Z0000 Encounter for general adult medical examination without abnormal findings: Secondary | ICD-10-CM

## 2015-11-21 DIAGNOSIS — E785 Hyperlipidemia, unspecified: Secondary | ICD-10-CM | POA: Diagnosis not present

## 2015-11-21 DIAGNOSIS — M810 Age-related osteoporosis without current pathological fracture: Secondary | ICD-10-CM

## 2015-11-21 DIAGNOSIS — K219 Gastro-esophageal reflux disease without esophagitis: Secondary | ICD-10-CM

## 2015-11-21 DIAGNOSIS — F39 Unspecified mood [affective] disorder: Secondary | ICD-10-CM

## 2015-11-21 DIAGNOSIS — Z6838 Body mass index (BMI) 38.0-38.9, adult: Secondary | ICD-10-CM

## 2015-11-21 LAB — BASIC METABOLIC PANEL
BUN: 27 mg/dL — ABNORMAL HIGH (ref 6–23)
CALCIUM: 9.7 mg/dL (ref 8.4–10.5)
CO2: 29 mEq/L (ref 19–32)
CREATININE: 1.12 mg/dL (ref 0.40–1.20)
Chloride: 105 mEq/L (ref 96–112)
GFR: 49.03 mL/min — AB (ref >60.00)
Glucose, Bld: 99 mg/dL (ref 70–99)
Potassium: 4.7 mEq/L (ref 3.5–5.1)
SODIUM: 142 meq/L (ref 135–145)

## 2015-11-21 LAB — LIPID PANEL
Cholesterol: 175 mg/dL (ref 0–200)
HDL: 86.8 mg/dL (ref >39.00)
LDL CALC: 67 mg/dL (ref 0–99)
NONHDL: 87.81
Total CHOL/HDL Ratio: 2
Triglycerides: 104 mg/dL (ref 0.0–149.0)
VLDL: 20.8 mg/dL (ref 0.0–40.0)

## 2015-11-21 NOTE — Progress Notes (Signed)
Pre visit review using our clinic review tool, if applicable. No additional management support is needed unless otherwise documented below in the visit note. 

## 2015-11-21 NOTE — Progress Notes (Signed)
Medicare Annual Preventive Care Visit  (initial annual wellness or annual wellness exam)  Concerns and/or follow up today:  HTN/hyperglycemia/HLD/Obesity/CKD: -meds: amlodipine 5mg  daily (decreased due to swelling 03/2015), fenofibrate, lasix 20mg , fish oil, losartan 50mg   -reports took potassium daily in the past -denies: HA, CP, SOB -on cholesterol medication in the past -hx of lower ext edema remotely and on lasix in the past for this too, reports bilat LE edema for > 15 years -denies: CP, SOB, DOE  GERD/PND: -pantoprazole, ranitidine -hx hiatal hernia -sees Dr. Senaida Ores for this but wants me to refill PPI - risks discussed, she wants to do low dose -also saw ENT and allergist -denies: heartburn, dysphagia  Hx Stroke -sees neurologist, hx of some memory issues -TIA February 05, 2012, R MCA -on plavix -stable per her report  Osteoporosis/Vit D deficiency: -sees Dr. Cruzita Lederer and on Prolia inj -last bone density 05/2015  OAB: -takes oxybutynin  -stable  OA/Chronic low back pain: -seeing ortho, had steroid inj in shoulder and knee per her report -hx of DDD, s/p remote disc surgery per her report  Depression: -started when son and husband died in 02-05-2004 -on wellbutrin 75 mg daily for this -stable, no SI  Allergic rhinitis: -takes zyrtec daily    ROS: negative for report of fevers, unintentional weight loss, vision changes, vision loss, hearing loss or change, chest pain, sob, hemoptysis, melena, hematochezia, hematuria, genital discharge or lesions, falls, bleeding or bruising, loc, thoughts of suicide or self harm, memory loss  1.) Patient-completed health risk assessment  - completed and reviewed, see scanned documentation  2.) Review of Medical History: -PMH, PSH, Family History and current specialty and care providers reviewed and updated and listed below  - see scanned in document in chart and below  Past Medical History  Diagnosis Date  . Stroke [facility])     sees  neurologist, hx memory loss, expressive aphasia  . Hypertension   . Hyperlipidemia   . GERD (gastroesophageal reflux disease)     hx hiatal hernia, has seen GI, ENT and allergist in the past  . OA (osteoarthritis)     knees and back, hx DDD, s/p remote lumbar disc surgery  . Osteoporosis     on prolia in the past  . Depression 10/17/2012  . BCC (basal cell carcinoma of skin) 04/10/2015    sees dermatologist  . OAB (overactive bladder) 04/10/2015  . Ectopic pregnancy   . Allergic rhinitis   . Obesity   . Venous insufficiency     Past Surgical History  Procedure Laterality Date  . Abdominal hysterectomy    . Breast reduction surgery    . Laparoscopic salpingoopherectomy    . Appendectomy    . Nasal mass excision      Basal Cell Cancer Excision  . Nasal reconstruction    . Tee without cardioversion  10/22/2012    Procedure: TRANSESOPHAGEAL ECHOCARDIOGRAM (TEE);  Surgeon: Josue Hector, MD;  Location: [facility] ENDOSCOPY;  Service: Cardiovascular;  Laterality: N/A;    Social History   Social History  . Marital Status: Widowed    Spouse Name: N/A  . Number of Children: 2  . Years of Education: 12   Occupational History  . retired    Social History Main Topics  . Smoking status: Former Research scientist (life sciences)  . Smokeless tobacco: Never Used  . Alcohol Use: No  . Drug Use: No  . Sexual Activity: No   Other Topics Concern  . Not on file   Social History Narrative  Patient is single with 2 children.   Patient is right handed.   Patient has 12 th grade education.   Patient does not drink caffeine.    Family History  Problem Relation Age of Onset  . Hypertension Father   . Hypertension Mother   . Cancer - Lung Brother     Current Outpatient Prescriptions on File Prior to Visit  Medication Sig Dispense Refill  . acetaminophen (TYLENOL) 500 MG tablet Take 500 mg by mouth as needed for pain.    Marland Kitchen amLODipine (NORVASC) 5 MG tablet Take 1 tablet (5 mg total) by mouth daily. 90 tablet 3   . buPROPion (WELLBUTRIN) 75 MG tablet take 1 tablet by mouth once daily with EVENING MEAL 90 tablet 4  . Calcium Citrate-Vitamin D 250-200 MG-UNIT TABS Take by mouth. Take two tabs daily    . cetirizine (ZYRTEC) 10 MG tablet Take 10 mg by mouth daily.    . Cinnamon 500 MG capsule Take 1,000 mg by mouth daily.    . clopidogrel (PLAVIX) 75 MG tablet Take 1 tablet (75 mg total) by mouth daily with breakfast. 30 tablet 3  . Coenzyme Q10 (CO Q 10) 100 MG CAPS Take 1 tablet by mouth daily. Hold while in hospital    . denosumab (PROLIA) 60 MG/ML SOLN injection Inject 60 mg into the skin every 6 (six) months. Administer in upper arm, thigh, or abdomen    . ergocalciferol (VITAMIN D2) 50000 UNITS capsule Take 1 capsule (50,000 Units total) by mouth once a week. 8 capsule 0  . fenofibrate (TRICOR) 145 MG tablet take 1 tablet by mouth once daily 90 tablet 1  . fluticasone (FLONASE) 50 MCG/ACT nasal spray instill 1 spray into each nostril twice a day 16 g 11  . furosemide (LASIX) 20 MG tablet Take 1 tablet (20 mg total) by mouth daily. 30 tablet 3  . Glucosamine-Chondroitin (OSTEO BI-FLEX REGULAR STRENGTH) 250-200 MG TABS Take 1 tablet by mouth daily. Hold while in hospital    . losartan (COZAAR) 50 MG tablet Take 1 tablet (50 mg total) by mouth daily. 90 tablet 2  . Multiple Vitamins-Minerals (CENTRUM SILVER PO) Take by mouth daily.    Marland Kitchen oxybutynin (DITROPAN-XL) 5 MG 24 hr tablet Take 5 mg by mouth daily.    . pantoprazole (PROTONIX) 20 MG tablet Take 1 tablet (20 mg total) by mouth daily. 90 tablet 3  . ranitidine (ZANTAC) 300 MG tablet     . vitamin B-12 (CYANOCOBALAMIN) 1000 MCG tablet Take 1,000 mcg by mouth daily.     No current facility-administered medications on file prior to visit.     3.) Review of functional ability and level of safety:  Any difficulty hearing?  NO  History of falling?  NO  Any trouble with IADLs - using a phone, using transportation, grocery shopping, preparing  meals, doing housework, doing laundry, taking medications and managing money? See scanned paper work  Advance Directives? NO  See summary of recommendations in Patient Instructions below.  4.) Physical Exam Filed Vitals:   11/21/15 1134  BP: 138/70  Pulse: 62  Temp: 97.9 F (36.6 C)   Estimated body mass index is 38.36 kg/(m^2) as calculated from the following:   Height as of this encounter: 4\' 8"  (1.422 m).   Weight as of this encounter: 171 lb (77.565 kg).  EKG (optional): deferred  General: alert, appear well hydrated and in no acute distress  HEENT: visual acuity grossly intact  CV: HRRR  Lungs: CTA bilaterally  Psych: pleasant and cooperative, no obvious depression or anxiety  Cognitive function: Normal MMSE  See patient instructions for recommendations.  Education and counseling regarding the above review of health provided with a plan for the following: -see scanned patient completed form for further details -fall prevention strategies discussed  -healthy lifestyle discussed -importance and resources for completing advanced directives discussed -see patient instructions below for any other recommendations provided  4)The following written screening schedule of preventive measures were reviewed with assessment and plan made per below, orders and patient instructions:      AAA screening done if applicable: n/a     Alcohol screening done     Obesity Screening and counseling done     STI screening (Hep C if born 40-65) n/a     Tobacco Screening done done       Pneumococcal (PPSV23 -one dose after 64, one before if risk factors), influenza yearly and hepatitis B vaccines (if high risk - end stage renal disease, IV drugs, homosexual men, live in home for mentally retarded, hemophilia receiving factors) ASSESSMENT/PLAN: done      Screening mammograph (yearly if >40) ASSESSMENT/PLAN: utd - declined further      Screening Pap smear/pelvic exam (q2  years) ASSESSMENT/PLAN: n/a, declined      Prostate cancer screening ASSESSMENT/PLAN: n/a      Colorectal cancer screening (FOBT yearly or flex sig q4y or colonoscopy q10y or barium enema q4y) ASSESSMENT/PLAN: declined further/ n/a      Diabetes outpatient self-management training services ASSESSMENT/PLAN: utd or done      Bone mass measurements(covered q2y if indicated - estrogen def, osteoporosis, hyperparathyroid, vertebral abnormalities, osteoporosis or steroids) ASSESSMENT/PLAN: utd       Screening for glaucoma(q1y if high risk - diabetes, FH, AA and > 29 or hispanic and > 58) ASSESSMENT/PLAN: sees Dr Bing Plume      Medical nutritional therapy for individuals with diabetes or renal disease ASSESSMENT/PLAN: see orders      Cardiovascular screening blood tests (lipids q5y) ASSESSMENT/PLAN: see orders and labs      Diabetes screening tests ASSESSMENT/PLAN: see orders and labs   7.) Summary: -risk factors and conditions per above assessment were discussed and treatment, recommendations and referrals were offered per documentation above and orders and patient instructions.  Medicare annual wellness visit, subsequent  H/O: CVA (cerebrovascular accident)  Essential hypertension - Plan: Basic metabolic panel  Hyperlipidemia - Plan: Lipid Panel  CKD (chronic kidney disease) stage 3, GFR 30-59 ml/min  BMI 38.0-38.9,adult  Gastroesophageal reflux disease, esophagitis presence not specified  Mood disorder ([place])  Osteoporosis  Patient Instructions  BEFORE YOU LEAVE: -schedule follow up in 4-6 months -labs  -We have ordered labs or studies at this visit. It can take up to 1-2 weeks for results and processing. We will contact you with instructions IF your results are abnormal. Normal results will be released to your [facility]. If you have not heard from [place] or can not find your results in [facility] in 2 weeks please contact our office.  We recommend the following healthy  lifestyle measures: - eat a healthy whole foods diet consisting of regular small meals composed of vegetables, fruits, beans, nuts, seeds, healthy meats such as white chicken and fish and whole grains.  - avoid sweets, white starchy foods, fried foods, fast food, processed foods, sodas, red meet and other fattening foods.  - get a least 150-300 minutes of aerobic exercise per week.   Please bring a copy of  your advanced directives to our office manager.

## 2015-11-21 NOTE — Patient Instructions (Signed)
BEFORE YOU LEAVE: -schedule follow up in 4-6 months -labs  -We have ordered labs or studies at this visit. It can take up to 1-2 weeks for results and processing. We will contact you with instructions IF your results are abnormal. Normal results will be released to your [facility]. If you have not heard from [place] or can not find your results in [facility] in 2 weeks please contact our office.  We recommend the following healthy lifestyle measures: - eat a healthy whole foods diet consisting of regular small meals composed of vegetables, fruits, beans, nuts, seeds, healthy meats such as white chicken and fish and whole grains.  - avoid sweets, white starchy foods, fried foods, fast food, processed foods, sodas, red meet and other fattening foods.  - get a least 150-300 minutes of aerobic exercise per week.   Please bring a copy of your advanced directives to our office manager.

## 2015-12-01 ENCOUNTER — Telehealth: Payer: Self-pay | Admitting: Family Medicine

## 2015-12-01 MED ORDER — CLOPIDOGREL BISULFATE 75 MG PO TABS: 75.0000 mg | ORAL_TABLET | Freq: Every day | ORAL | Status: DC

## 2015-12-01 NOTE — Telephone Encounter (Signed)
Pt needs refill on plavix send to [facility] [address]. Pt call sher once med has been sent

## 2015-12-01 NOTE — Telephone Encounter (Signed)
It appears has not been filled by [place] in the past? Would advise refill with prescriber. If out and she can't reach prescribing physician ok to send 30 days until can get from prescriber. Thanks.

## 2015-12-01 NOTE — Telephone Encounter (Signed)
I called the pt and informed her of the message below and she stated Dr Luciana Axe had given her this medication.  She then placed [facility] on the phone and she was screaming in the phone that she showed the medication to Dr Maudie Mercury at her last visit and she told her that if she needed a refill to let the pharmacy know that she was now her PCP and the pt does not see [name] anymore?  Stated "don't make her loose it or I was not understanding what she was trying to say".  I advised Garnett Farm a 30 day supply will be sent to the pts pharmacy and the message left for Dr Maudie Mercury on Monday.

## 2015-12-04 ENCOUNTER — Telehealth: Payer: Self-pay | Admitting: Family Medicine

## 2015-12-04 MED ORDER — CLOPIDOGREL BISULFATE 75 MG PO TABS: 75.0000 mg | ORAL_TABLET | Freq: Every day | ORAL | Status: DC

## 2015-12-04 NOTE — Telephone Encounter (Signed)
Noted  

## 2015-12-04 NOTE — Telephone Encounter (Signed)
Spoke with Wendie Simmer, whom reports pt/daughter were very rude to her on the phone and screamed at her and another staff member confirms. My CMA does not feel comfortable returning her call. Will have [place] address. Trying to sort out her medications after recent transfer of care. All questions regarding prescribing are for her safety and wanted to ensure specialist nor managing this. If rude to staff in future may need to dismiss.

## 2015-12-04 NOTE — Telephone Encounter (Signed)
Pt decline to talk with team health. Pt would like md to call her back concerning her medication. Pt is aware md may not be able to return call today

## 2015-12-04 NOTE — Telephone Encounter (Signed)
I am sorry someone was upset. If they would like for [place] to rx - that is ok. She sees specialists for a number of issues and it looked like one of them had refilled it in the past.  Sent 90 days with 3 refills. Thanks.

## 2015-12-13 ENCOUNTER — Other Ambulatory Visit: Payer: Self-pay | Admitting: Family Medicine

## 2015-12-29 ENCOUNTER — Other Ambulatory Visit: Payer: Self-pay | Admitting: Family Medicine

## 2016-01-03 DIAGNOSIS — M5386 Other specified dorsopathies, lumbar region: Secondary | ICD-10-CM | POA: Diagnosis not present

## 2016-01-03 DIAGNOSIS — M9905 Segmental and somatic dysfunction of pelvic region: Secondary | ICD-10-CM | POA: Diagnosis not present

## 2016-01-03 DIAGNOSIS — M9903 Segmental and somatic dysfunction of lumbar region: Secondary | ICD-10-CM | POA: Diagnosis not present

## 2016-01-03 DIAGNOSIS — Q72811 Congenital shortening of right lower limb: Secondary | ICD-10-CM | POA: Diagnosis not present

## 2016-01-03 DIAGNOSIS — M25551 Pain in right hip: Secondary | ICD-10-CM | POA: Diagnosis not present

## 2016-01-03 DIAGNOSIS — M9904 Segmental and somatic dysfunction of sacral region: Secondary | ICD-10-CM | POA: Diagnosis not present

## 2016-01-29 ENCOUNTER — Telehealth: Payer: Self-pay | Admitting: Family Medicine

## 2016-01-29 ENCOUNTER — Telehealth: Payer: Self-pay | Admitting: Internal Medicine

## 2016-01-29 NOTE — Telephone Encounter (Signed)
PT calling to schedule her Prolia injection.  She said to call tomorrow after 11:00 AM

## 2016-01-29 NOTE — Telephone Encounter (Signed)
She gets this with Dr. Cruzita Lederer.

## 2016-01-29 NOTE — Telephone Encounter (Signed)
I left a detailed message at the pts home number she should get this injection done at Dr Arman Filter office.

## 2016-01-29 NOTE — Telephone Encounter (Signed)
Pt states it is time for her prolia inj in May and would like to have it at her appointment on May 22. Is that OK?

## 2016-02-01 NOTE — Telephone Encounter (Signed)
Advised that her Prolia inj is not due until next month, we will call her when the PA is complete and schedule the Prolia inj. Pt voiced understanding.

## 2016-02-04 ENCOUNTER — Other Ambulatory Visit: Payer: Self-pay | Admitting: Family Medicine

## 2016-02-12 ENCOUNTER — Telehealth: Payer: Self-pay | Admitting: Family Medicine

## 2016-02-12 NOTE — Telephone Encounter (Signed)
Patient Name: Charlene Wade DOB: 1928-09-05 Initial Comment caller states mothers feet and legs are swollen Nurse Assessment Nurse: Vallery Sa, RN, Cathy Date/Time (Eastern Time): 02/12/2016 11:49:07 AM Confirm and document reason for call. If symptomatic, describe symptoms. You must click the next button to save text entered. ---Garnett Farm states that Arica developed swelling of her feet and lower legs. No severe breathing difficulty. No chest pain. No fever. No known injury in the past week. No pain in her feet or legs. Has the patient traveled out of the country within the last 30 days? ---No Does the patient have any new or worsening symptoms? ---Yes Will a triage be completed? ---Yes Related visit to physician within the last 2 weeks? ---No Does the PT have any chronic conditions? (i.e. diabetes, asthma, etc.) ---Yes List chronic conditions. ---Back and knee problems, High Blood Pressure Is this a behavioral health or substance abuse call? ---No Guidelines Guideline Title Affirmed Question Affirmed Notes Leg Swelling and Edema [1] Can't walk or can barely walk AND [2] new onset Final Disposition User Go to ED Now Vallery Sa, RN, [employer] states she will have to check with Lauris to see which ER she wants to go to. Encouraged to call back as needed. Referrals GO TO FACILITY UNDECIDED Disagree/Comply: Comply

## 2016-03-01 ENCOUNTER — Telehealth: Payer: Self-pay | Admitting: *Deleted

## 2016-03-01 NOTE — Telephone Encounter (Signed)
Spoke with patient's caregiver, Garnett Farm who stated patient is still asleep. She was reluctant to change appointment and have pt see NP without speaking to patient. She stated she will call back next week if patient agrees to see NP. She understands that Dr Leonie Man will be in the office. She verbalized understanding of call.

## 2016-03-01 NOTE — Telephone Encounter (Signed)
LVM requesting call back re: appointment next week. Left name, number, office hours today.

## 2016-03-01 NOTE — Telephone Encounter (Signed)
Cher/Caregiver [phone] returned [facility] call.

## 2016-03-04 ENCOUNTER — Telehealth: Payer: Self-pay | Admitting: Internal Medicine

## 2016-03-04 ENCOUNTER — Telehealth: Payer: Self-pay | Admitting: Neurology

## 2016-03-04 NOTE — Telephone Encounter (Signed)
Patient's caregiver is calling back and states that someone from here called her on Friday and asked if the patient would see one of the PA at the same time on 5/9.  She was calling back to say they would see a PA at the same time. She became very irritated when I could not give her the exact 30 minute time slot so we decided to keep the patient with Dr. Leonie Man @ 2:30.

## 2016-03-04 NOTE — Telephone Encounter (Signed)
Can pt come get her prolia yet it is due this month

## 2016-03-04 NOTE — Telephone Encounter (Signed)
Rose, can you initiate a PA for a Prolia inj for Charlene Wade? She is due May 23rd or after. Thank you so much!

## 2016-03-05 ENCOUNTER — Ambulatory Visit: Payer: Medicare Other | Admitting: Neurology

## 2016-03-11 ENCOUNTER — Encounter: Payer: Self-pay | Admitting: Nurse Practitioner

## 2016-03-11 ENCOUNTER — Ambulatory Visit (INDEPENDENT_AMBULATORY_CARE_PROVIDER_SITE_OTHER): Payer: Medicare Other | Admitting: Nurse Practitioner

## 2016-03-11 VITALS — BP 144/64 | HR 72 | Ht <= 58 in | Wt 175.0 lb

## 2016-03-11 DIAGNOSIS — I10 Essential (primary) hypertension: Secondary | ICD-10-CM

## 2016-03-11 DIAGNOSIS — E785 Hyperlipidemia, unspecified: Secondary | ICD-10-CM | POA: Diagnosis not present

## 2016-03-11 DIAGNOSIS — Z8673 Personal history of transient ischemic attack (TIA), and cerebral infarction without residual deficits: Secondary | ICD-10-CM

## 2016-03-11 NOTE — Patient Instructions (Addendum)
Stressed the importance of management of risk factors to prevent further stroke Continue Plavix for secondary stroke prevention Maintain strict control of hypertension with blood pressure goal below 130/90, today's reading 144/64 continue antihypertensive medications Cholesterol with LDL cholesterol less than 70, followed by primary care, continue fenofibrate Exercise by walking, , eat healthy diet with whole grains,  fresh fruits and vegetables   memory score is stable, age-appropriate Follow-up yearly and when necessary

## 2016-03-11 NOTE — Progress Notes (Addendum)
[facility]  PATIENT: Charlene Wade DOB: 12-12-1927   REASON FOR VISIT: Follow-up for cerebral vascular accident HISTORY FROM: Patient    HISTORY OF PRESENT ILLNESS:01/26/13 (PS): [name] is 82 year old Caucasian lady seen for followup today following hospital admission in December 2013 for TIA. She was admitted with hypertensive emergency and had transient slurred speech and right-sided weakness. CT head was negative for acute infarct and showed only white matter changes. Transthoracic echo showed normal ejection fraction. Transesophageal echocardiogram done subsequently as an outpatient showed no evidence of cardiac source of embolism or patent foramen ovale. Carotid Doppler showed no significant extracranial stenosis. Hb A1c was 5. Lipid profile was normal. Patient had previously had right temporal MCA branch infarct in August 2013 and was admitted to [facility] that time. She was placed on aspirin which was subsequently changed to Aggrenox by her primary physician. She however for some reason had stopped taking Aggrenox when she was admitted in December. Patient states she's done well on the Plavix and has not had any bleeding though she has minor bruising. She states her blood pressure is under good control and today it is 135/62 in office. Her lipid profile last checked in December was fine. She states that she's lost some weight and plans to be more physically active. She uses a walker mostly but uses a cane for short distances. She states she has recovered physically quite well from her previous stroke in August and has no new neurological complaints.   UPDATE 08/17/13 (LL): [name] comes in for stroke 6 month revisit. She states she is doing well, BP is controlled. She reports no falls. She does not know when the last time she had blood work was at her PCP. She is tolerating Plavix and baby aspirin well with no signs of excessive  bruising. She states she has just finished another round of physical therapy.   UPDATE 02/16/14 (LL): [name] comes in for stroke 6 month revisit. She is tolerating Plavix and baby aspirin well with no signs of excessive bruising. Her MMSE is 28/30, unchanged from last visit with a GDS of 2 only. She states her blood pressure is well controlled, it is 124/54 in the office today. She has runny nose and post-nasal drip with cough that she states has been going on for a year; she was referred to ENT Dr. Constance Holster. 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 She is being treated with allergy meds. She denies any falls. UPDATE 08/24/2014 PS: She is doing well without any TIA or stroke symptoms. She remains on plavix and aspirin and tolerating it well. She had lipids checked by primary MD 4 weeks ago which were fine but I do not have the results.She had f/u carotid ultrasound which I personally reviewed and shows no significant extracranial stenosis.She has persistent mucus in her throat and is seeing a allergist for that.She has no new complaints.MMSE today Which is 28/30 unchanged from last visit Update 03/07/2015 PS: She returns for follow-up after last visit 6 months ago. She is accompanied by a caregiver friend. She continues to have mucus or irritation in her throat and has seen Dr. Constance Holster from ENT as well as a allergist and was started on Zyrtec and a pain medication which helped somewhat. She did discontinue aspirin and remains on Plavix alone. She continues to have decrease in short-term memory and gets confused off and on she lives alone at home but her friend visits  6 days per week for 8 hours. She is not driving. She walks with a 4 pronged cane and has not had any falls. There have been no safety concerns. She wants a referral to the gastroenterologist for evaluation for gastroesophageal reflux disease UPDATE 05/15/2017CM [name], 82 year old female returns for yearly follow-up. She  has a history of stroke event which occurred in December 2013. She is currently on Plavix without further stroke or TIA symptoms. She is accompanied today by her caregiver friend that lives with her. She continues to have some memory issues. She is not driving. She has not had any falls in the last year. She is using a quad cane there are no safety issues identified. She has left knee pain and is not a surgical candidate but does get injections every so often. She returns for reevaluation REVIEW OF SYSTEMS: Full 14 system review of systems performed and notable only for those listed, all others are neg:  Constitutional: Fatigue Cardiovascular: neg Ear/Nose/Throat: Hearing loss Skin: neg Eyes: neg Respiratory: Occasional cough Gastroitestinal: neg  Hematology/Lymphatic: neg  Endocrine: Intolerance to cold Musculoskeletal: Left knee pain, back pain Allergy/Immunology: neg Neurological: Memory loss Psychiatric: neg Sleep : neg   ALLERGIES: Allergies  Allergen Reactions  . Ace Inhibitors     unknown  . Codeine     unknown  . Levaquin [Levofloxacin]     unknown  . Penicillins     unknown  . Sulfa Antibiotics     unknown    HOME MEDICATIONS: Outpatient Prescriptions Prior to Visit  Medication Sig Dispense Refill  . acetaminophen (TYLENOL) 500 MG tablet Take 500 mg by mouth as needed for pain.    Marland Kitchen amLODipine (NORVASC) 5 MG tablet Take 1 tablet (5 mg total) by mouth daily. 90 tablet 3  . buPROPion (WELLBUTRIN) 75 MG tablet take 1 tablet by mouth once daily with EVENING MEAL 90 tablet 4  . Calcium Citrate-Vitamin D 250-200 MG-UNIT TABS Take by mouth. Take two tabs daily    . cetirizine (ZYRTEC) 10 MG tablet Take 10 mg by mouth daily.    . Cinnamon 500 MG capsule Take 1,000 mg by mouth daily.    . clopidogrel (PLAVIX) 75 MG tablet Take 1 tablet (75 mg total) by mouth daily with breakfast. 90 tablet 3  . Coenzyme Q10 (CO Q 10) 100 MG CAPS Take 1 tablet by mouth daily. Hold while in  hospital    . denosumab (PROLIA) 60 MG/ML SOLN injection Inject 60 mg into the skin every 6 (six) months. Administer in upper arm, thigh, or abdomen    . fenofibrate (TRICOR) 145 MG tablet take 1 tablet by mouth once daily 90 tablet 1  . fluticasone (FLONASE) 50 MCG/ACT nasal spray instill 1 spray into each nostril twice a day 16 g 11  . furosemide (LASIX) 20 MG tablet Take 1 tablet (20 mg total) by mouth daily. 30 tablet 3  . Glucosamine-Chondroitin (OSTEO BI-FLEX REGULAR STRENGTH) 250-200 MG TABS Take 1 tablet by mouth daily. Hold while in hospital    . losartan (COZAAR) 50 MG tablet Take 1 tablet (50 mg total) by mouth daily. 90 tablet 2  . Multiple Vitamins-Minerals (CENTRUM SILVER PO) Take by mouth daily.    Marland Kitchen oxybutynin (DITROPAN-XL) 5 MG 24 hr tablet take 1 tablet by mouth once daily 90 tablet 3  . pantoprazole (PROTONIX) 20 MG tablet Take 1 tablet (20 mg total) by mouth daily. 90 tablet 3  . ranitidine (ZANTAC) 300 MG tablet     .  ergocalciferol (VITAMIN D2) 50000 UNITS capsule Take 1 capsule (50,000 Units total) by mouth once a week. 8 capsule 0  . vitamin B-12 (CYANOCOBALAMIN) 1000 MCG tablet Take 1,000 mcg by mouth daily.     No facility-administered medications prior to visit.    PAST MEDICAL HISTORY: Past Medical History  Diagnosis Date  . Stroke [facility])     sees neurologist, hx memory loss, expressive aphasia  . Hypertension   . Hyperlipidemia   . GERD (gastroesophageal reflux disease)     hx hiatal hernia, has seen GI, ENT and allergist in the past  . OA (osteoarthritis)     knees and back, hx DDD, s/p remote lumbar disc surgery  . Osteoporosis     on prolia in the past  . Depression 10/17/2012  . BCC (basal cell carcinoma of skin) 04/10/2015    sees dermatologist  . OAB (overactive bladder) 04/10/2015  . Ectopic pregnancy   . Allergic rhinitis   . Obesity   . Venous insufficiency     PAST SURGICAL HISTORY: Past Surgical History  Procedure Laterality Date  .  Abdominal hysterectomy    . Breast reduction surgery    . Laparoscopic salpingoopherectomy    . Appendectomy    . Nasal mass excision      Basal Cell Cancer Excision  . Nasal reconstruction    . Tee without cardioversion  10/22/2012    Procedure: TRANSESOPHAGEAL ECHOCARDIOGRAM (TEE);  Surgeon: Josue Hector, MD;  Location: [facility] ENDOSCOPY;  Service: Cardiovascular;  Laterality: N/A;    FAMILY HISTORY: Family History  Problem Relation Age of Onset  . Hypertension Father   . Hypertension Mother   . Cancer - Lung Brother     SOCIAL HISTORY: Social History   Social History  . Marital Status: Widowed    Spouse Name: N/A  . Number of Children: 2  . Years of Education: 12   Occupational History  . retired    Social History Main Topics  . Smoking status: Former Research scientist (life sciences)  . Smokeless tobacco: Never Used  . Alcohol Use: No  . Drug Use: No  . Sexual Activity: No   Other Topics Concern  . Not on file   Social History Narrative   Patient is single with 2 children.   Patient is right handed.   Patient has 12 th grade education.   Patient does not drink caffeine.     PHYSICAL EXAM  Filed Vitals:   03/11/16 1523  BP: 144/64  Pulse: 72  Height: 4\' 8"  (1.422 m)  Weight: 175 lb (79.379 kg)   Body mass index is 39.26 kg/(m^2). General: well developed, well nourished, Obese female seated, in no evident distress  Head: head normocephalic and atraumatic. Orohparynx benign  Neck: supple with no carotid  bruits  Cardiovascular: regular rate and rhythm, no murmurs  Musculoskeletal mild kyphosis  Skin mild pedal edema. No rash.   Neurologic Exam  Mental Status: Awake and fully alert. Oriented to place and time. MMSE 24 out of 30 missing items in orientation calculation 1 of 3 recall animal  fluencytest 10. Clock drawing 4/4.  Cranial Nerves: Pupils equal, briskly reactive to light. Extraocular movements full without nystagmus. Visual fields full to confrontation. Hearing  intact and symmetric to finer snap. Facial sensation intact. Face, tongue, palate move normally and symmetrically. Neck flexion and extension normal.  Motor: Normal bulk and tone. Normal strength in all tested extremity muscles.  Sensory: intact to touch in the upper and lower extremities  Coordination: Rapid alternating movements normal in all extremities. Finger-to-nose performed accurately bilaterally.  Gait and Station: Arises from chair with one plus assist. Stance is wide based  Slow and careful gait. Not able to heel, toe and tandem walk . Uses a cane.  Reflexes: 1+ and symmetric. Toes downgoing.  DIAGNOSTIC DATA (LABS, IMAGING, TESTING) -    Component Value Date/Time   NA 142 11/21/2015 1210   K 4.7 11/21/2015 1210   CL 105 11/21/2015 1210   CO2 29 11/21/2015 1210   GLUCOSE 99 11/21/2015 1210   BUN 27* 11/21/2015 1210   CREATININE 1.12 11/21/2015 1210   CREATININE 1.09* 06/02/2015 1503   CALCIUM 9.7 11/21/2015 1210   PROT 6.7 10/16/2012 2138   ALBUMIN 3.6 10/16/2012 2138   AST 24 10/16/2012 2138   ALT 6 10/16/2012 2138   ALKPHOS 36* 10/16/2012 2138   BILITOT 0.3 10/16/2012 2138   GFRNONAA 46* 06/02/2015 1503   GFRNONAA 39* 10/16/2012 2138   GFRAA 53* 06/02/2015 1503   GFRAA 46* 10/16/2012 2138   Lab Results  Component Value Date   CHOL 175 11/21/2015   HDL 86.80 11/21/2015   LDLCALC 67 11/21/2015   TRIG 104.0 11/21/2015   CHOLHDL 2 11/21/2015       ASSESSMENT AND PLAN 82 year old lady with left hemispheric TIA in December 2013 and right MCA branch infarcts likely of embolic etiology without definite identified source of embolism. Vascular risk factors of hypertension and hyperlipidemia. Mild age-appropriate cognitive impairment. The patient is a current patient of Dr. Leonie Man  who is out of the office today . This note is sent to the work in doctor.     PLAN: Stressed the importance of management of risk factors to prevent further stroke Continue Plavix for  secondary stroke prevention Maintain strict control of hypertension with blood pressure goal below 130/90, today's reading 144/64 continue antihypertensive medications Cholesterol with LDL cholesterol less than 70, followed by primary care, continue fenofibrate Exercise by walking, , eat healthy diet with whole grains,  fresh fruits and vegetables   memory score is stable, age-appropriate Follow-up yearly and when necessary, if stable at next visit will discharge Dennie Bible, [facility], [facility], [facility] [address], [place], [place] [address] [phone]  I reviewed the above note and documentation by the Nurse Practitioner and agree with the history, physical exam, assessment and plan as outlined above. I was immediately available for face-to-face consultation. Star Age, MD, PhD [facility])

## 2016-03-12 ENCOUNTER — Telehealth: Payer: Self-pay | Admitting: Nurse Practitioner

## 2016-03-12 DIAGNOSIS — M1712 Unilateral primary osteoarthritis, left knee: Secondary | ICD-10-CM | POA: Diagnosis not present

## 2016-03-12 NOTE — Telephone Encounter (Signed)
Garnett Farm, pts care giver called to give list of vitamins. Please [phone]

## 2016-03-12 NOTE — Telephone Encounter (Signed)
LMVM for pt that returned call.  °

## 2016-03-13 ENCOUNTER — Encounter: Payer: Self-pay | Admitting: *Deleted

## 2016-03-13 NOTE — Telephone Encounter (Signed)
I spoke to caregiver and she gave me the list of vitamins and I placed on med list.

## 2016-03-14 NOTE — Telephone Encounter (Signed)
I have rec'd Ms. [employer] insurance verification for Prolia and she will have an estimated responsibility of $30.  Please make pt aware this is an estimate and we will not know an exact amt until insurance(s) has/have paid.  I have sent a copy of the summary of benefits to be scanned into pt's chart.    Once pt recs injection, please let me know actual injection date so I can update the Prolia portal.  If you have any questions, please let me know.  Thank you!

## 2016-03-18 ENCOUNTER — Encounter: Payer: Self-pay | Admitting: Family Medicine

## 2016-03-18 ENCOUNTER — Ambulatory Visit (INDEPENDENT_AMBULATORY_CARE_PROVIDER_SITE_OTHER): Payer: Medicare Other | Admitting: Family Medicine

## 2016-03-18 VITALS — BP 124/64 | HR 67 | Temp 97.6°F | Ht <= 58 in | Wt 172.4 lb

## 2016-03-18 DIAGNOSIS — F39 Unspecified mood [affective] disorder: Secondary | ICD-10-CM

## 2016-03-18 DIAGNOSIS — Z6838 Body mass index (BMI) 38.0-38.9, adult: Secondary | ICD-10-CM

## 2016-03-18 DIAGNOSIS — E785 Hyperlipidemia, unspecified: Secondary | ICD-10-CM

## 2016-03-18 DIAGNOSIS — I10 Essential (primary) hypertension: Secondary | ICD-10-CM

## 2016-03-18 DIAGNOSIS — N183 Chronic kidney disease, stage 3 unspecified: Secondary | ICD-10-CM

## 2016-03-18 DIAGNOSIS — Z8673 Personal history of transient ischemic attack (TIA), and cerebral infarction without residual deficits: Secondary | ICD-10-CM

## 2016-03-18 DIAGNOSIS — N3281 Overactive bladder: Secondary | ICD-10-CM

## 2016-03-18 DIAGNOSIS — M159 Polyosteoarthritis, unspecified: Secondary | ICD-10-CM

## 2016-03-18 DIAGNOSIS — R600 Localized edema: Secondary | ICD-10-CM

## 2016-03-18 DIAGNOSIS — M810 Age-related osteoporosis without current pathological fracture: Secondary | ICD-10-CM

## 2016-03-18 LAB — CBC
HCT: 38.1 % (ref 36.0–46.0)
HEMOGLOBIN: 12.4 g/dL (ref 12.0–15.0)
MCHC: 32.6 g/dL (ref 30.0–36.0)
MCV: 89.6 fl (ref 78.0–100.0)
PLATELETS: 339 10*3/uL (ref 150.0–400.0)
RBC: 4.25 Mil/uL (ref 3.87–5.11)
RDW: 14.7 % (ref 11.5–15.5)
WBC: 9.5 10*3/uL (ref 4.0–10.5)

## 2016-03-18 LAB — BASIC METABOLIC PANEL
BUN: 27 mg/dL — AB (ref 6–23)
CHLORIDE: 106 meq/L (ref 96–112)
CO2: 28 meq/L (ref 19–32)
CREATININE: 1 mg/dL (ref 0.40–1.20)
Calcium: 9.6 mg/dL (ref 8.4–10.5)
GFR: 55.84 mL/min — ABNORMAL LOW (ref >60.00)
Glucose, Bld: 101 mg/dL — ABNORMAL HIGH (ref 70–99)
Potassium: 4.5 mEq/L (ref 3.5–5.1)
Sodium: 140 mEq/L (ref 135–145)

## 2016-03-18 NOTE — Patient Instructions (Addendum)
BEFORE YOU LEAVE: -labs -schedule follow up in 1 month for blood pressure recheck  -We placed a referral for you as discussed to the cardiologist per your request. It usually takes about 1-2 weeks to process and schedule this referral. If you have not heard from [place] regarding this appointment in 2 weeks please contact our office.  STOP the norvasc per your request  Increase lasix to 1.5 tablets daily in the morning  Use compression stockings daily, remove at night. Can purchase device to help with applying at medical supply store or online.  Tylenol 500-1000mg  up to three times daily is ok for pain.

## 2016-03-18 NOTE — Progress Notes (Signed)
Pre visit review using our clinic review tool, if applicable. No additional management support is needed unless otherwise documented below in the visit note. 

## 2016-03-18 NOTE — Progress Notes (Signed)
HPI:  HTN/hyperglycemia/HLD/Obesity/CKD: -meds:norvasc 5(decreased due to swelling 03/2015), losartan 50mg , fenofibrate, lasix 20mg , fish oil  -denies: HA, CP, SOB -she wants to stop the norvasc as feels is causing her leg swelling -hx of lower ext edema, reported bilat LE edema for > 15 years at prior visit - now reports has been for 1 year and that her orthopedic doctor advised referral to cardiologist which she wants to pursue -she saw cardiology in the past, last echo in 02/17/12 with normal EF, mild diastolic dysfunction -does not wear compression socks, does not take lasix on regular basis because make her pee - uses a few days per week -hx allergy/intolerance to acei, statin -denies: CP, SOB, DOE  GERD/PND: -pantoprazole, ranitidine -hx hiatal hernia -sees Dr. Senaida Ores  -also saw ENT and allergist in the past -denies: heartburn, dysphagia  Hx Stroke -sees neurologist, hx of some memory issues -TIA 17-Feb-2012, R MCA -on plavix -stable per her report  Osteoporosis/Vit D deficiency: -sees Dr. Cruzita Lederer and on Prolia inj -last bone density [id number] -hx complication fosmax, on prolia for many years  OAB: -takes oxybutynin  -stable  OA/Chronic low back pain: -seeing ortho, had steroid inj in shoulder and knee per her report -hx of DDD, s/p remote disc surgery per her report -wonder what is safe for aches and pains  Depression: -started when son and husband died in 2004/02/17 -on wellbutrin 75 mg daily for this -stable, no SI  Allergic rhinitis: -takes zyrtec daily  ROS: See pertinent positives and negatives per HPI.  Past Medical History  Diagnosis Date  . Stroke [facility])     sees neurologist, hx memory loss, expressive aphasia  . Hypertension   . Hyperlipidemia   . GERD (gastroesophageal reflux disease)     hx hiatal hernia, has seen GI, ENT and allergist in the past  . OA (osteoarthritis)     knees and back, hx DDD, s/p remote lumbar disc surgery  . Osteoporosis     on prolia  in the past  . Depression 10/17/2012  . BCC (basal cell carcinoma of skin) 04/10/2015    sees dermatologist  . OAB (overactive bladder) 04/10/2015  . Ectopic pregnancy   . Allergic rhinitis   . Obesity   . Venous insufficiency     Past Surgical History  Procedure Laterality Date  . Abdominal hysterectomy    . Breast reduction surgery    . Laparoscopic salpingoopherectomy    . Appendectomy    . Nasal mass excision      Basal Cell Cancer Excision  . Nasal reconstruction    . Tee without cardioversion  10/22/2012    Procedure: TRANSESOPHAGEAL ECHOCARDIOGRAM (TEE);  Surgeon: Josue Hector, MD;  Location: [facility] ENDOSCOPY;  Service: Cardiovascular;  Laterality: N/A;    Family History  Problem Relation Age of Onset  . Hypertension Father   . Hypertension Mother   . Cancer - Lung Brother     Social History   Social History  . Marital Status: Widowed    Spouse Name: N/A  . Number of Children: 2  . Years of Education: 12   Occupational History  . retired    Social History Main Topics  . Smoking status: Former Research scientist (life sciences)  . Smokeless tobacco: Never Used  . Alcohol Use: No  . Drug Use: No  . Sexual Activity: No   Other Topics Concern  . None   Social History Narrative   Patient is single with 2 children.   Patient is right handed.  Patient has 12 th grade education.   Patient does not drink caffeine.     Current outpatient prescriptions:  .  acetaminophen (TYLENOL) 500 MG tablet, Take 500 mg by mouth as needed for pain., Disp: , Rfl:  .  buPROPion (WELLBUTRIN) 75 MG tablet, take 1 tablet by mouth once daily with EVENING MEAL, Disp: 90 tablet, Rfl: 4 .  Calcium Citrate-Vitamin D 250-200 MG-UNIT TABS, Take by mouth. Take two tabs daily, Disp: , Rfl:  .  cetirizine (ZYRTEC) 10 MG tablet, Take 10 mg by mouth daily., Disp: , Rfl:  .  cholecalciferol (VITAMIN D) 1000 units tablet, Take 2,000 Units by mouth 2 (two) times daily., Disp: , Rfl:  .  clopidogrel (PLAVIX) 75 MG  tablet, Take 1 tablet (75 mg total) by mouth daily with breakfast., Disp: 90 tablet, Rfl: 3 .  Coenzyme Q10 (CO Q 10) 100 MG CAPS, Take 1 tablet by mouth daily. Hold while in hospital, Disp: , Rfl:  .  Cyanocobalamin (VITAMIN B12 PO), Take by mouth., Disp: , Rfl:  .  denosumab (PROLIA) 60 MG/ML SOLN injection, Inject 60 mg into the skin every 6 (six) months. Administer in upper arm, thigh, or abdomen, Disp: , Rfl:  .  fenofibrate (TRICOR) 145 MG tablet, take 1 tablet by mouth once daily, Disp: 90 tablet, Rfl: 1 .  fluticasone (FLONASE) 50 MCG/ACT nasal spray, instill 1 spray into each nostril twice a day, Disp: 16 g, Rfl: 11 .  furosemide (LASIX) 20 MG tablet, Take 1 tablet (20 mg total) by mouth daily., Disp: 30 tablet, Rfl: 3 .  Glucosamine-Chondroitin (OSTEO BI-FLEX REGULAR STRENGTH) 250-200 MG TABS, Take 1 tablet by mouth daily. Hold while in hospital, Disp: , Rfl:  .  losartan (COZAAR) 50 MG tablet, Take 1 tablet (50 mg total) by mouth daily., Disp: 90 tablet, Rfl: 2 .  Multiple Vitamins-Minerals (CENTRUM SILVER PO), Take by mouth daily., Disp: , Rfl:  .  Multiple Vitamins-Minerals (PRESERVISION AREDS PO), Take 1 tablet by mouth 2 (two) times daily., Disp: , Rfl:  .  oxybutynin (DITROPAN-XL) 5 MG 24 hr tablet, take 1 tablet by mouth once daily, Disp: 90 tablet, Rfl: 3 .  pantoprazole (PROTONIX) 20 MG tablet, Take 1 tablet (20 mg total) by mouth daily., Disp: 90 tablet, Rfl: 3 .  ranitidine (ZANTAC) 300 MG tablet, , Disp: , Rfl:  .  UNABLE TO FIND, Med Name: fiber supplement BID, Disp: , Rfl:   EXAM:  Filed Vitals:   03/18/16 1446  BP: 124/64  Pulse: 67  Temp: 97.6 F (36.4 C)    Body mass index is 38.67 kg/(m^2).  GENERAL: vitals reviewed and listed above, alert, oriented, appears well hydrated and in no acute distress  HEENT: atraumatic, conjunttiva clear, no obvious abnormalities on inspection of external nose and ears  NECK: no obvious masses on inspection  LUNGS: clear to  auscultation bilaterally, no wheezes, rales or rhonchi, good air movement  CV: HRRR, 1+ bilateral ankle edema to mid calf peripheral edema  MS: moves all extremities without noticeable abnormality  PSYCH: pleasant and cooperative, no obvious depression or anxiety  ASSESSMENT AND PLAN:  Discussed the following assessment and plan: > 40 minute spent with this pt with > 50% spent face to face in counseling.   Essential hypertension - Plan: CBC (no diff), Basic metabolic panel Bilateral edema of lower extremity -chronic LE Edema, ECHO in the past  -advise treatment would include combo of lasix on regular basis and compression sock -  she doesn't want to do either - lengthy discussion about etiologies, implications, work up, treatment options, further work up -she wants to stop norvasc and see cardiologist -advised 20-30mg  lasix daily, compression socks - discussed methods for application and various devices that can be used to assist in application -follow up closely to recheck BP off norvasc  CKD (chronic kidney disease) stage 3, GFR 30-59 ml/min -continue to monitor, BMP today, avoid nsaids  OAB (overactive bladder) -continue current tx, difficult situation with this and her need for diuretic  Osteoarthritis of multiple joints, unspecified osteoarthritis type -advised of options for pain and advise regular activity -tylenol in safe dosing amounts, tumeric and topical sports creams would likely be safest to start  H/O: CVA (cerebrovascular accident) -cont current treatment  Hyperlipidemia BMI 38.0-38.9,adult -lifestyle recs  Mood disorder (HCC) -stable, chronically grumpy   Osteoporosis -sees Dr. Cruzita Lederer  -Patient advised to return or notify a doctor immediately if symptoms worsen or persist or new concerns arise.  Patient Instructions  BEFORE YOU LEAVE: -labs -schedule follow up in 1 month for blood pressure recheck  -We placed a referral for you as discussed to the  cardiologist per your request. It usually takes about 1-2 weeks to process and schedule this referral. If you have not heard from [place] regarding this appointment in 2 weeks please contact our office.  STOP the norvasc per your request  Increase lasix to 1.5 tablets daily in the morning  Use compression stockings daily, remove at night. Can purchase device to help with applying at medical supply store or online.  Tylenol 500-1000mg  up to three times daily is ok for pain.        Colin Benton R.

## 2016-03-19 ENCOUNTER — Other Ambulatory Visit (INDEPENDENT_AMBULATORY_CARE_PROVIDER_SITE_OTHER): Payer: Medicare Other | Admitting: *Deleted

## 2016-03-19 ENCOUNTER — Ambulatory Visit (INDEPENDENT_AMBULATORY_CARE_PROVIDER_SITE_OTHER): Payer: Medicare Other | Admitting: *Deleted

## 2016-03-19 DIAGNOSIS — M810 Age-related osteoporosis without current pathological fracture: Secondary | ICD-10-CM

## 2016-03-19 MED ORDER — DENOSUMAB 60 MG/ML ~~LOC~~ SOLN
60.0000 mg | Freq: Once | SUBCUTANEOUS | Status: AC
  Administered 2016-03-19: 60 mg via SUBCUTANEOUS

## 2016-03-20 DIAGNOSIS — M1712 Unilateral primary osteoarthritis, left knee: Secondary | ICD-10-CM | POA: Diagnosis not present

## 2016-03-25 ENCOUNTER — Other Ambulatory Visit: Payer: Self-pay | Admitting: Family Medicine

## 2016-03-29 DIAGNOSIS — M1712 Unilateral primary osteoarthritis, left knee: Secondary | ICD-10-CM | POA: Diagnosis not present

## 2016-04-02 DIAGNOSIS — M1712 Unilateral primary osteoarthritis, left knee: Secondary | ICD-10-CM | POA: Diagnosis not present

## 2016-04-03 ENCOUNTER — Telehealth: Payer: Self-pay | Admitting: Family Medicine

## 2016-04-03 DIAGNOSIS — R600 Localized edema: Secondary | ICD-10-CM

## 2016-04-03 NOTE — Telephone Encounter (Addendum)
Pt would like to switch from lasix to hctz. Pt said lasix is to harsh on her system. [facility] [address]. Pt is aware dr Maudie Mercury out of office today

## 2016-04-03 NOTE — Telephone Encounter (Signed)
Please advise 

## 2016-04-04 NOTE — Telephone Encounter (Signed)
Left message for patient to return phone call. Will route to [facility] for further follow up.

## 2016-04-04 NOTE — Telephone Encounter (Signed)
The lasix is much better for helping the edema that she was worried about at the last appt. Hctz will still make her urinate, but will not likely help as much with the edema.  Would advise to stick with lasix if she can. If not, hxtz 12.5 daily and follow up in 1 month.

## 2016-04-09 DIAGNOSIS — M1712 Unilateral primary osteoarthritis, left knee: Secondary | ICD-10-CM | POA: Diagnosis not present

## 2016-04-15 ENCOUNTER — Telehealth: Payer: Self-pay | Admitting: Cardiovascular Disease

## 2016-04-15 NOTE — Telephone Encounter (Signed)
New Message  Pt requesting to speak with RN about paperwork she asked to be mailed to her for check in. please call back to discuss

## 2016-04-15 NOTE — Telephone Encounter (Signed)
Spoke with pt, new patient paperwork mailed to confirmed home address.

## 2016-04-16 DIAGNOSIS — M1712 Unilateral primary osteoarthritis, left knee: Secondary | ICD-10-CM | POA: Diagnosis not present

## 2016-04-17 ENCOUNTER — Ambulatory Visit (INDEPENDENT_AMBULATORY_CARE_PROVIDER_SITE_OTHER): Payer: Medicare Other | Admitting: Cardiovascular Disease

## 2016-04-17 ENCOUNTER — Encounter: Payer: Self-pay | Admitting: Cardiovascular Disease

## 2016-04-17 VITALS — BP 138/72 | HR 71 | Ht <= 58 in | Wt 173.0 lb

## 2016-04-17 DIAGNOSIS — R600 Localized edema: Secondary | ICD-10-CM

## 2016-04-17 DIAGNOSIS — I10 Essential (primary) hypertension: Secondary | ICD-10-CM | POA: Diagnosis not present

## 2016-04-17 DIAGNOSIS — E785 Hyperlipidemia, unspecified: Secondary | ICD-10-CM | POA: Diagnosis not present

## 2016-04-17 NOTE — Patient Instructions (Signed)
Medication Instructions:  Your physician recommends that you continue on your current medications as directed. Please refer to the Current Medication list given to you today.   Labwork: none  Testing/Procedures: none  Follow-Up: Your physician recommends that you schedule a follow-up appointment ON AN AS NEEDED BASIS.  Any Other Special Instructions Will Be Listed Below (If Applicable).     If you need a refill on your cardiac medications before your next appointment, please call your pharmacy.

## 2016-04-17 NOTE — Progress Notes (Signed)
04/17/2016 Charlene Wade   1928/08/26  [id number]  Primary Physician Lucretia Kern., DO Primary Cardiologist: Lorretta Harp MD Renae Gloss  HPI:  Charlene Wade is a 82 year old moderately overweight widowed Caucasian female mother of one living child, grandmother and 2 grandchildren referred by her primary care physician Dr. Maudie Mercury and her orthopedist for cardiovascular evaluation because of lower extremity edema.She has a history of hypertension and hyperlipidemia. She did have a stroke 4 years ago and has left-sided weakness. She walks with a walker walker. She has had some dyspnea. She denies chest pain. She has never had a heart attack. She has chronic lower extremity edema probably contributed to by being on Norvasc.   Current Outpatient Prescriptions  Medication Sig Dispense Refill  . acetaminophen (TYLENOL) 500 MG tablet Take 500 mg by mouth as needed for pain.    Marland Kitchen buPROPion (WELLBUTRIN) 75 MG tablet take 1 tablet by mouth once daily with EVENING MEAL 90 tablet 4  . Calcium Citrate-Vitamin D 250-200 MG-UNIT TABS Take by mouth. Take two tabs daily    . cetirizine (ZYRTEC) 10 MG tablet Take 10 mg by mouth daily.    . cholecalciferol (VITAMIN D) 1000 units tablet Take 2,000 Units by mouth 2 (two) times daily.    . clopidogrel (PLAVIX) 75 MG tablet Take 1 tablet (75 mg total) by mouth daily with breakfast. 90 tablet 3  . Coenzyme Q10 (CO Q 10) 100 MG CAPS Take 1 tablet by mouth daily. Hold while in hospital    . Cyanocobalamin (VITAMIN B12 PO) Take by mouth.    . denosumab (PROLIA) 60 MG/ML SOLN injection Inject 60 mg into the skin every 6 (six) months. Administer in upper arm, thigh, or abdomen    . fenofibrate (TRICOR) 145 MG tablet take 1 tablet by mouth once daily 90 tablet 1  . fluticasone (FLONASE) 50 MCG/ACT nasal spray instill 1 spray into each nostril twice a day 16 g 11  . furosemide (LASIX) 20 MG tablet Take 1.5 tablets (30 mg total) by mouth daily. 45  tablet 3  . Glucosamine-Chondroitin (OSTEO BI-FLEX REGULAR STRENGTH) 250-200 MG TABS Take 1 tablet by mouth daily. Hold while in hospital    . losartan (COZAAR) 50 MG tablet Take 1 tablet (50 mg total) by mouth daily. 90 tablet 2  . Multiple Vitamins-Minerals (CENTRUM SILVER PO) Take by mouth daily.    . Multiple Vitamins-Minerals (PRESERVISION AREDS PO) Take 1 tablet by mouth 2 (two) times daily.    Marland Kitchen oxybutynin (DITROPAN-XL) 5 MG 24 hr tablet take 1 tablet by mouth once daily 90 tablet 3  . pantoprazole (PROTONIX) 20 MG tablet Take 1 tablet (20 mg total) by mouth daily. 90 tablet 3  . ranitidine (ZANTAC) 300 MG tablet     . UNABLE TO FIND Med Name: fiber supplement BID     No current facility-administered medications for this visit.    Allergies  Allergen Reactions  . Ace Inhibitors     unknown  . Codeine     unknown  . Levaquin [Levofloxacin]     unknown  . Penicillins     unknown  . Sulfa Antibiotics     unknown    Social History   Social History  . Marital Status: Widowed    Spouse Name: N/A  . Number of Children: 2  . Years of Education: 12   Occupational History  . retired    Social History Main Topics  .  Smoking status: Former Research scientist (life sciences)  . Smokeless tobacco: Never Used  . Alcohol Use: No  . Drug Use: No  . Sexual Activity: No   Other Topics Concern  . Not on file   Social History Narrative   Patient is single with 2 children.   Patient is right handed.   Patient has 12 th grade education.   Patient does not drink caffeine.     Review of Systems: General: negative for chills, fever, night sweats or weight changes.  Cardiovascular: negative for chest pain, dyspnea on exertion, edema, orthopnea, palpitations, paroxysmal nocturnal dyspnea or shortness of breath Dermatological: negative for rash Respiratory: negative for cough or wheezing Urologic: negative for hematuria Abdominal: negative for nausea, vomiting, diarrhea, bright red blood per rectum,  melena, or hematemesis Neurologic: negative for visual changes, syncope, or dizziness All other systems reviewed and are otherwise negative except as noted above.    Blood pressure 138/72, pulse 71, height 4\' 9"  (1.448 m), weight 173 lb (78.472 kg).  General appearance: alert and no distress Neck: no adenopathy, no carotid bruit, no JVD, supple, symmetrical, trachea midline and thyroid not enlarged, symmetric, no tenderness/mass/nodules Lungs: clear to auscultation bilaterally Heart: regular rate and rhythm, S1, S2 normal, no murmur, click, rub or gallop Extremities: trace to 1+ bilateral ankle edema  EKG ormal/71 with septal Q waves. I personally reviewed this EKG  ASSESSMENT AND PLAN:   HTN (hypertension) History of hypertension blood pressure measured at 138/72 She is on losartan. Continue current meds at current dosing  Hyperlipidemia History of hyperlipidemia not on statin therapy with recent lipid profile performed 11/21/15 revealed a total cholesterol 175, LDL 67 and HDL of 86.  Leg edema History of chronic lower extremity edema. She was on amlodipine in the past which was discontinued. This improved her edema. She does not tolerate furosemide. Today she only has minimal ankle edema I do not think she needs to be on a diuretic.      Lorretta Harp MD FACP,FACC,FAHA, [facility] 04/17/2016 4:33 PM

## 2016-04-17 NOTE — Assessment & Plan Note (Signed)
History of chronic lower extremity edema. She was on amlodipine in the past which was discontinued. This improved her edema. She does not tolerate furosemide. Today she only has minimal ankle edema I do not think she needs to be on a diuretic.

## 2016-04-17 NOTE — Assessment & Plan Note (Signed)
History of hyperlipidemia not on statin therapy with recent lipid profile performed 11/21/15 revealed a total cholesterol 175, LDL 67 and HDL of 86.

## 2016-04-17 NOTE — Assessment & Plan Note (Signed)
History of hypertension blood pressure measured at 138/72 She is on losartan. Continue current meds at current dosing

## 2016-04-22 ENCOUNTER — Ambulatory Visit (INDEPENDENT_AMBULATORY_CARE_PROVIDER_SITE_OTHER): Payer: Medicare Other | Admitting: Family Medicine

## 2016-04-22 ENCOUNTER — Encounter: Payer: Self-pay | Admitting: Family Medicine

## 2016-04-22 VITALS — BP 136/78 | HR 69 | Temp 97.6°F | Ht <= 58 in | Wt 173.8 lb

## 2016-04-22 DIAGNOSIS — N3281 Overactive bladder: Secondary | ICD-10-CM | POA: Diagnosis not present

## 2016-04-22 DIAGNOSIS — R600 Localized edema: Secondary | ICD-10-CM

## 2016-04-22 DIAGNOSIS — I10 Essential (primary) hypertension: Secondary | ICD-10-CM

## 2016-04-22 NOTE — Progress Notes (Signed)
HPI:  Follow up:  HTN/edema: -both longstanding -she was very worried about the edema and wanted to see cardiologist whom she saw recently; no recs made other then did not feel diuretic needed and improved off norvasc -resolve mostly with stopping norvasc last visit -she is not taking the lasix or using compression and feels she is doing okay -She has had several bladder sling surgeries in the past and takes oxybutynin for overactive bladder, reports if she wears a tummy controller and this is fine, but otherwise has some urinary frequency/incontinence only in the afternoons and evenings -She does not want to take Lasix, reports she would take hydrochlorothiazide if she needs a diuretic in the future - her sister has a pharmacology book and told her this would be a better choice -denies: CP, SOB, DOE  ROS: See pertinent positives and negatives per HPI.  Past Medical History  Diagnosis Date  . Stroke [facility])     sees neurologist, hx memory loss, expressive aphasia  . Hypertension   . Hyperlipidemia   . GERD (gastroesophageal reflux disease)     hx hiatal hernia, has seen GI, ENT and allergist in the past  . OA (osteoarthritis)     knees and back, hx DDD, s/p remote lumbar disc surgery  . Osteoporosis     on prolia in the past  . Depression 10/17/2012  . BCC (basal cell carcinoma of skin) 04/10/2015    sees dermatologist  . OAB (overactive bladder) 04/10/2015  . Ectopic pregnancy   . Allergic rhinitis   . Obesity   . Venous insufficiency     Past Surgical History  Procedure Laterality Date  . Abdominal hysterectomy    . Breast reduction surgery    . Laparoscopic salpingoopherectomy    . Appendectomy    . Nasal mass excision      Basal Cell Cancer Excision  . Nasal reconstruction    . Tee without cardioversion  10/22/2012    Procedure: TRANSESOPHAGEAL ECHOCARDIOGRAM (TEE);  Surgeon: Josue Hector, MD;  Location: [facility] ENDOSCOPY;  Service: Cardiovascular;  Laterality: N/A;     Family History  Problem Relation Age of Onset  . Hypertension Father   . Hypertension Mother   . Cancer - Lung Brother     Social History   Social History  . Marital Status: Widowed    Spouse Name: N/A  . Number of Children: 2  . Years of Education: 12   Occupational History  . retired    Social History Main Topics  . Smoking status: Former Research scientist (life sciences)  . Smokeless tobacco: Never Used  . Alcohol Use: No  . Drug Use: No  . Sexual Activity: No   Other Topics Concern  . None   Social History Narrative   Patient is single with 2 children.   Patient is right handed.   Patient has 12 th grade education.   Patient does not drink caffeine.     Current outpatient prescriptions:  .  acetaminophen (TYLENOL) 500 MG tablet, Take 500 mg by mouth as needed for pain., Disp: , Rfl:  .  buPROPion (WELLBUTRIN) 75 MG tablet, take 1 tablet by mouth once daily with EVENING MEAL, Disp: 90 tablet, Rfl: 4 .  Calcium Citrate-Vitamin D 250-200 MG-UNIT TABS, Take by mouth. Take two tabs daily, Disp: , Rfl:  .  cetirizine (ZYRTEC) 10 MG tablet, Take 10 mg by mouth daily., Disp: , Rfl:  .  cholecalciferol (VITAMIN D) 1000 units tablet, Take 2,000 Units by mouth  2 (two) times daily., Disp: , Rfl:  .  clopidogrel (PLAVIX) 75 MG tablet, Take 1 tablet (75 mg total) by mouth daily with breakfast., Disp: 90 tablet, Rfl: 3 .  Coenzyme Q10 (CO Q 10) 100 MG CAPS, Take 1 tablet by mouth daily. Hold while in hospital, Disp: , Rfl:  .  Cyanocobalamin (VITAMIN B12 PO), Take by mouth., Disp: , Rfl:  .  denosumab (PROLIA) 60 MG/ML SOLN injection, Inject 60 mg into the skin every 6 (six) months. Administer in upper arm, thigh, or abdomen, Disp: , Rfl:  .  fenofibrate (TRICOR) 145 MG tablet, take 1 tablet by mouth once daily, Disp: 90 tablet, Rfl: 1 .  fluticasone (FLONASE) 50 MCG/ACT nasal spray, instill 1 spray into each nostril twice a day, Disp: 16 g, Rfl: 11 .  Glucosamine-Chondroitin (OSTEO BI-FLEX REGULAR  STRENGTH) 250-200 MG TABS, Take 1 tablet by mouth daily. Hold while in hospital, Disp: , Rfl:  .  losartan (COZAAR) 50 MG tablet, Take 1 tablet (50 mg total) by mouth daily., Disp: 90 tablet, Rfl: 2 .  Multiple Vitamins-Minerals (CENTRUM SILVER PO), Take by mouth daily., Disp: , Rfl:  .  Multiple Vitamins-Minerals (PRESERVISION AREDS PO), Take 1 tablet by mouth 2 (two) times daily., Disp: , Rfl:  .  oxybutynin (DITROPAN-XL) 5 MG 24 hr tablet, take 1 tablet by mouth once daily, Disp: 90 tablet, Rfl: 3 .  pantoprazole (PROTONIX) 20 MG tablet, Take 1 tablet (20 mg total) by mouth daily., Disp: 90 tablet, Rfl: 3 .  ranitidine (ZANTAC) 300 MG tablet, , Disp: , Rfl:  .  UNABLE TO FIND, Med Name: fiber supplement BID, Disp: , Rfl:   EXAM:  Filed Vitals:   04/22/16 1441  BP: 136/78  Pulse: 69  Temp: 97.6 F (36.4 C)    Body mass index is 37.6 kg/(m^2).  GENERAL: vitals reviewed and listed above, alert, oriented, appears well hydrated and in no acute distress  HEENT: atraumatic, conjunttiva clear, no obvious abnormalities on inspection of external nose and ears  NECK: no obvious masses on inspection  LUNGS: clear to auscultation bilaterally, no wheezes, rales or rhonchi, good air movement  CV: HRRR, Trace bilateral lower extremity peripheral edema  MS: moves all extremities without noticeable abnormality  PSYCH: pleasant and cooperative, no obvious depression or anxiety  ASSESSMENT AND PLAN:  Discussed the following assessment and plan:  Essential hypertension  Bilateral edema of lower extremity  OAB (overactive bladder)  -Patient advised to return or notify a doctor immediately if symptoms worsen or persist or new concerns arise.  Patient Instructions  Follow up in 4 months.  Continue off of the lasix.  Elevate legs and use compression if needed.  Try cutting out Tea to see if this helps. A healthy diet with avoidance of processed and restaurant food can also help.  We  recommend the following healthy lifestyle measures: - eat a healthy whole foods diet consisting of regular small meals composed of vegetables, fruits, beans, nuts, seeds, healthy meats such as white chicken and fish and whole grains.  - avoid sweets, white starchy foods, fried foods, fast food, processed foods, sodas, red meet and other fattening foods.  - get a least 150-300 minutes of aerobic exercise per week.       Colin Benton R.

## 2016-04-22 NOTE — Progress Notes (Signed)
Pre visit review using our clinic review tool, if applicable. No additional management support is needed unless otherwise documented below in the visit note. 

## 2016-04-22 NOTE — Patient Instructions (Signed)
Follow up in 4 months.  Continue off of the lasix.  Elevate legs and use compression if needed.  Try cutting out Tea to see if this helps. A healthy diet with avoidance of processed and restaurant food can also help.  We recommend the following healthy lifestyle measures: - eat a healthy whole foods diet consisting of regular small meals composed of vegetables, fruits, beans, nuts, seeds, healthy meats such as white chicken and fish and whole grains.  - avoid sweets, white starchy foods, fried foods, fast food, processed foods, sodas, red meet and other fattening foods.  - get a least 150-300 minutes of aerobic exercise per week.

## 2016-04-22 NOTE — Telephone Encounter (Signed)
Patient was seen in the office today and states she stopped the Lasix.

## 2016-04-26 DIAGNOSIS — M1712 Unilateral primary osteoarthritis, left knee: Secondary | ICD-10-CM | POA: Diagnosis not present

## 2016-05-01 DIAGNOSIS — M1712 Unilateral primary osteoarthritis, left knee: Secondary | ICD-10-CM | POA: Diagnosis not present

## 2016-05-10 DIAGNOSIS — M1712 Unilateral primary osteoarthritis, left knee: Secondary | ICD-10-CM | POA: Diagnosis not present

## 2016-05-14 DIAGNOSIS — M7062 Trochanteric bursitis, left hip: Secondary | ICD-10-CM | POA: Diagnosis not present

## 2016-05-14 DIAGNOSIS — M1712 Unilateral primary osteoarthritis, left knee: Secondary | ICD-10-CM | POA: Diagnosis not present

## 2016-05-15 DIAGNOSIS — H353123 Nonexudative age-related macular degeneration, left eye, advanced atrophic without subfoveal involvement: Secondary | ICD-10-CM | POA: Diagnosis not present

## 2016-05-15 DIAGNOSIS — H52223 Regular astigmatism, bilateral: Secondary | ICD-10-CM | POA: Diagnosis not present

## 2016-05-15 DIAGNOSIS — H353213 Exudative age-related macular degeneration, right eye, with inactive scar: Secondary | ICD-10-CM | POA: Diagnosis not present

## 2016-05-15 DIAGNOSIS — H524 Presbyopia: Secondary | ICD-10-CM | POA: Diagnosis not present

## 2016-05-15 DIAGNOSIS — H5213 Myopia, bilateral: Secondary | ICD-10-CM | POA: Diagnosis not present

## 2016-05-15 DIAGNOSIS — H04123 Dry eye syndrome of bilateral lacrimal glands: Secondary | ICD-10-CM | POA: Diagnosis not present

## 2016-05-29 DIAGNOSIS — M1712 Unilateral primary osteoarthritis, left knee: Secondary | ICD-10-CM | POA: Diagnosis not present

## 2016-06-03 ENCOUNTER — Ambulatory Visit (INDEPENDENT_AMBULATORY_CARE_PROVIDER_SITE_OTHER): Payer: Medicare Other | Admitting: Internal Medicine

## 2016-06-03 ENCOUNTER — Encounter: Payer: Self-pay | Admitting: Internal Medicine

## 2016-06-03 VITALS — BP 128/88 | HR 72 | Ht <= 58 in | Wt 172.0 lb

## 2016-06-03 DIAGNOSIS — E559 Vitamin D deficiency, unspecified: Secondary | ICD-10-CM | POA: Insufficient documentation

## 2016-06-03 LAB — VITAMIN D 25 HYDROXY (VIT D DEFICIENCY, FRACTURES): VITD: 48.19 ng/mL (ref 30.00–100.00)

## 2016-06-03 NOTE — Progress Notes (Signed)
Patient ID: Charlene Wade, female   DOB: September 23, 1928, 82 y.o.   MRN: [id number]   HPI  Charlene Wade is a 82 y.o.-year-old female,  osteoporosis. She is here with her caretaker who offers part of the hx. Last visit 1 year ago.  Pt was dx with OP in 2007.  She fell and had a R humerus fx (comminuted) >> in 11/2011.  Reviewed DEXA scan reports - improved at last check: Results: 06/28/2015 ([facility]) Lumbar spine (L1-L4) Femoral neck (FN)  T-score  +0.4  RFN: -1.3 LFN: -1.3    08/01/2011 - [facility]):  L1-L3 (L4) T score: 0.0 RFN T score: -1.6 LFN T score: -1.7 FRAX score: MOF 12.6%, hip fracture risk 3.2% 05/28/2006 [name]):  L1-L4 T score: -1.0 LFN T score: -1.3 1/3 distal forearm T score: -2.7 VFA was performed and was read as abnormal, however no details given in the report and the image quality is too poor for me to analyze.  She has been on the following OP treatments:  - Fosamax - for "many years"  - developed a cyst 5-6 years ago in the bone of the wisdom tooth >> extraction >> infection >> was told she had a jaw fracture - Prolia - so far, she took it consistently since 2013 ([place] [facility]).  Received records from [place]: Prolia injections documented- no co-pay required: - 03/03/2015  - 09/14/2014   At last visit, we decided to continue Prolia. She had 2 doses since last visit: - 09/19/2015 - 03/19/2016  + h/o vitamin D deficiency >> we started Ergocalciferol 50,000 IU weekly for 8 weeks, then continued with 4000 units daily Cholecalciferol >> level normalized: Lab Results  Component Value Date   VD25OH 34.96 10/02/2015   VD25OH 15.79 (L) 06/02/2015   Pt was on calcium and vitamin D - only takes then when she remembers. She also eats dairy (yoghurt every day) but no green, leafy, vegetables.   No weight bearing exercises.   No stairs at home. Caretaker home 6 days a  week, 8h a day.   No h/o hyper/hypocalcemia. No h/o hyperparathyroidism. No h/o kidney stones. Lab Results  Component Value Date   PTH 8.3 (L) 10/13/2012   CALCIUM 9.6 03/18/2016   CALCIUM 9.7 11/21/2015   CALCIUM 9.1 06/02/2015   CALCIUM 8.7 04/25/2015   CALCIUM 9.7 10/16/2012   CALCIUM 10.0 10/14/2012   CALCIUM 10.3 10/13/2012   CALCIUM 10.6 (H) 10/12/2012   No h/o thyrotoxicosis. Last tsh: Lab Results  Component Value Date   TSH 1.48 06/02/2015   She has a h/o CKD. Last BUN/Cr: Lab Results  Component Value Date   BUN 27 (H) 03/18/2016   CREATININE 1.00 03/18/2016  GFR 43.66.  Pt does have a FH of osteoporosis. Mother with osteoporosis - arm fx.   I reviewed her chart and she also has a history of spinal stenosis - s/p surgery for ruptured disk. She had inj in spine.   ROS: Constitutional: no weight gain/loss, + fatigue, + subjective hypothermia, + nocturia Eyes: no blurry vision, no xerophthalmia ENT: no sore throat, no nodules palpated in throat, no dysphagia/odynophagia Cardiovascular: no CP/+ SOB/no palpitations/+ leg swelling Respiratory: no cough/+ SOB Gastrointestinal: no N/V/D/C/heartburn Musculoskeletal: + both: muscle/joint aches Skin: no rashes Neurological: no tremors/numbness/tingling/dizziness  I reviewed pt's medications, allergies, PMH, social hx, family hx, and changes were documented in the history of present illness. Otherwise, unchanged from my  initial visit note.  Past Medical History:  Diagnosis Date  . Allergic rhinitis   . BCC (basal cell carcinoma of skin) 04/10/2015   sees dermatologist  . Depression 10/17/2012  . Ectopic pregnancy   . GERD (gastroesophageal reflux disease)    hx hiatal hernia, has seen GI, ENT and allergist in the past  . Hyperlipidemia   . Hypertension   . OA (osteoarthritis)    knees and back, hx DDD, s/p remote lumbar disc surgery  . OAB (overactive bladder) 04/10/2015  . Obesity   . Osteoporosis    on prolia  in the past  . Stroke [facility])    sees neurologist, hx memory loss, expressive aphasia  . Venous insufficiency    Past Surgical History:  Procedure Laterality Date  . ABDOMINAL HYSTERECTOMY    . APPENDECTOMY    . BREAST REDUCTION SURGERY    . LAPAROSCOPIC SALPINGOOPHERECTOMY    . NASAL MASS EXCISION     Basal Cell Cancer Excision  . NASAL RECONSTRUCTION    . TEE WITHOUT CARDIOVERSION  10/22/2012   Procedure: TRANSESOPHAGEAL ECHOCARDIOGRAM (TEE);  Surgeon: Josue Hector, MD;  Location: [facility] ENDOSCOPY;  Service: Cardiovascular;  Laterality: N/A;   Social History   Social History  . Marital status: Widowed    Spouse name: N/A  . Number of children: 2  . Years of education: 12   Occupational History  . retired    Social History Main Topics  . Smoking status: Former Research scientist (life sciences)  . Smokeless tobacco: Never Used  . Alcohol use No  . Drug use: No  . Sexual activity: No   Other Topics Concern  . Not on file   Social History Narrative   Patient is single with 2 children.   Patient is right handed.   Patient has 12 th grade education.   Patient does not drink caffeine.   Current Outpatient Prescriptions on File Prior to Visit  Medication Sig Dispense Refill  . acetaminophen (TYLENOL) 500 MG tablet Take 500 mg by mouth as needed for pain.    Marland Kitchen buPROPion (WELLBUTRIN) 75 MG tablet take 1 tablet by mouth once daily with EVENING MEAL 90 tablet 4  . Calcium Citrate-Vitamin D 250-200 MG-UNIT TABS Take by mouth. Take two tabs daily    . cetirizine (ZYRTEC) 10 MG tablet Take 10 mg by mouth daily.    . cholecalciferol (VITAMIN D) 1000 units tablet Take 2,000 Units by mouth 2 (two) times daily.    . clopidogrel (PLAVIX) 75 MG tablet Take 1 tablet (75 mg total) by mouth daily with breakfast. 90 tablet 3  . Coenzyme Q10 (CO Q 10) 100 MG CAPS Take 1 tablet by mouth daily. Hold while in hospital    . Cyanocobalamin (VITAMIN B12 PO) Take by mouth.    . denosumab (PROLIA) 60 MG/ML SOLN injection  Inject 60 mg into the skin every 6 (six) months. Administer in upper arm, thigh, or abdomen    . fenofibrate (TRICOR) 145 MG tablet take 1 tablet by mouth once daily 90 tablet 1  . fluticasone (FLONASE) 50 MCG/ACT nasal spray instill 1 spray into each nostril twice a day 16 g 11  . Glucosamine-Chondroitin (OSTEO BI-FLEX REGULAR STRENGTH) 250-200 MG TABS Take 1 tablet by mouth daily. Hold while in hospital    . losartan (COZAAR) 50 MG tablet Take 1 tablet (50 mg total) by mouth daily. 90 tablet 2  . Multiple Vitamins-Minerals (CENTRUM SILVER PO) Take by mouth daily.    Marland Kitchen  Multiple Vitamins-Minerals (PRESERVISION AREDS PO) Take 1 tablet by mouth 2 (two) times daily.    Marland Kitchen oxybutynin (DITROPAN-XL) 5 MG 24 hr tablet take 1 tablet by mouth once daily 90 tablet 3  . pantoprazole (PROTONIX) 20 MG tablet Take 1 tablet (20 mg total) by mouth daily. 90 tablet 3  . ranitidine (ZANTAC) 300 MG tablet     . UNABLE TO FIND Med Name: fiber supplement BID     No current facility-administered medications on file prior to visit.    Allergies  Allergen Reactions  . Ace Inhibitors     unknown  . Codeine     unknown  . Levaquin [Levofloxacin]     unknown  . Penicillins     unknown  . Sulfa Antibiotics     unknown   Family History  Problem Relation Age of Onset  . Hypertension Father   . Hypertension Mother   . Cancer - Lung Brother    PE: BP 128/88 (BP Location: Right Arm, Patient Position: Sitting)   Pulse 72   Ht 4\' 8"  (1.422 m)   Wt 172 lb (78 kg)   SpO2 97%   BMI 38.56 kg/m  Wt Readings from Last 3 Encounters:  06/03/16 172 lb (78 kg)  04/22/16 173 lb 12.8 oz (78.8 kg)  04/17/16 173 lb (78.5 kg)   Constitutional: overweight, in NAD. +  kyphosis. Eyes: PERRLA, EOMI, no exophthalmos ENT: moist mucous membranes, no thyromegaly, no cervical lymphadenopathy Cardiovascular: RRR, No MRG Respiratory: CTA B Gastrointestinal: abdomen soft, NT, ND, BS+ Musculoskeletal: no deformities, strength  intact in all 4 Skin: moist, warm, no rashes Neurological: no tremor with outstretched hands, DTR normal in all 4  Assessment: 1. Osteoporosis  2. Vit D deficiency  Plan: 1. Osteoporosis - likely postmenopausal  - + 2013 humeral fx - Reviewed available DEXA scan reports - We discussed about continuing with Prolia for a total of 6 years (we cannot use bisphosphonates 2/2 decreased GFR). I don't think she is a candidate for Teriparatide at the moment.  - we reviewed her dietary and supplemental calcium and vitamin D intake - I recommended at least 1200 mg of calcium daily and continue with 4000 units of vitamin D daily. Will check a vit D today. -  again discussed fall precautions   - We will check a new Vitamin D - will arrange for Prolia - advised not to stop tx abruptly after Prolia as her BMD could decrease fast - will check a new DEXA scan in 2 years from the previous   2. Vit D def - she believes she takes 4000 units daily (after the Ergocalciferol that we started at last visit) >> will check at home - check level of vit D today  Office Visit on 06/03/2016  Component Date Value Ref Range Status  . VITD 06/03/2016 48.19  30.00 - 100.00 ng/mL Final   Excellent vit D level >> continue current vit D supplement dose.  Philemon Kingdom, MD PhD [facility] Endocrinology

## 2016-06-03 NOTE — Patient Instructions (Addendum)
Please stop at the lab.  Please make sure you get ~1200 mg calcium a day.  Look at home to see what dose of vitamin D you are taking.   Please return in 1 year.

## 2016-06-04 ENCOUNTER — Telehealth: Payer: Self-pay

## 2016-06-04 NOTE — Telephone Encounter (Signed)
Left message for patient to return call for lab results, gave call back number.

## 2016-06-05 ENCOUNTER — Telehealth: Payer: Self-pay | Admitting: Internal Medicine

## 2016-06-05 NOTE — Telephone Encounter (Signed)
Patient is returning your call.  

## 2016-06-18 DIAGNOSIS — M25562 Pain in left knee: Secondary | ICD-10-CM | POA: Diagnosis not present

## 2016-06-18 DIAGNOSIS — M1712 Unilateral primary osteoarthritis, left knee: Secondary | ICD-10-CM | POA: Diagnosis not present

## 2016-06-24 DIAGNOSIS — M1712 Unilateral primary osteoarthritis, left knee: Secondary | ICD-10-CM | POA: Diagnosis not present

## 2016-06-26 DIAGNOSIS — Z85828 Personal history of other malignant neoplasm of skin: Secondary | ICD-10-CM | POA: Diagnosis not present

## 2016-06-26 DIAGNOSIS — L821 Other seborrheic keratosis: Secondary | ICD-10-CM | POA: Diagnosis not present

## 2016-06-26 DIAGNOSIS — Z808 Family history of malignant neoplasm of other organs or systems: Secondary | ICD-10-CM | POA: Diagnosis not present

## 2016-06-26 DIAGNOSIS — D1721 Benign lipomatous neoplasm of skin and subcutaneous tissue of right arm: Secondary | ICD-10-CM | POA: Diagnosis not present

## 2016-06-26 DIAGNOSIS — D225 Melanocytic nevi of trunk: Secondary | ICD-10-CM | POA: Diagnosis not present

## 2016-07-02 DIAGNOSIS — M1712 Unilateral primary osteoarthritis, left knee: Secondary | ICD-10-CM | POA: Diagnosis not present

## 2016-07-10 DIAGNOSIS — M1712 Unilateral primary osteoarthritis, left knee: Secondary | ICD-10-CM | POA: Diagnosis not present

## 2016-07-12 ENCOUNTER — Other Ambulatory Visit: Payer: Self-pay | Admitting: Family Medicine

## 2016-07-12 NOTE — Telephone Encounter (Signed)
Rx refill sent to pharmacy. 

## 2016-07-14 ENCOUNTER — Other Ambulatory Visit: Payer: Self-pay | Admitting: Family Medicine

## 2016-07-19 DIAGNOSIS — M1712 Unilateral primary osteoarthritis, left knee: Secondary | ICD-10-CM | POA: Diagnosis not present

## 2016-08-13 ENCOUNTER — Ambulatory Visit: Payer: Medicare Other | Admitting: Family Medicine

## 2016-08-13 ENCOUNTER — Other Ambulatory Visit: Payer: Self-pay | Admitting: Family Medicine

## 2016-08-16 ENCOUNTER — Ambulatory Visit (INDEPENDENT_AMBULATORY_CARE_PROVIDER_SITE_OTHER): Payer: Medicare Other | Admitting: Family Medicine

## 2016-08-16 ENCOUNTER — Encounter: Payer: Self-pay | Admitting: Family Medicine

## 2016-08-16 VITALS — BP 138/60 | HR 74 | Temp 97.7°F | Ht <= 58 in | Wt 171.7 lb

## 2016-08-16 DIAGNOSIS — N183 Chronic kidney disease, stage 3 unspecified: Secondary | ICD-10-CM

## 2016-08-16 DIAGNOSIS — E785 Hyperlipidemia, unspecified: Secondary | ICD-10-CM | POA: Diagnosis not present

## 2016-08-16 DIAGNOSIS — R600 Localized edema: Secondary | ICD-10-CM

## 2016-08-16 DIAGNOSIS — I10 Essential (primary) hypertension: Secondary | ICD-10-CM

## 2016-08-16 DIAGNOSIS — K219 Gastro-esophageal reflux disease without esophagitis: Secondary | ICD-10-CM

## 2016-08-16 DIAGNOSIS — Z23 Encounter for immunization: Secondary | ICD-10-CM | POA: Diagnosis not present

## 2016-08-16 DIAGNOSIS — F39 Unspecified mood [affective] disorder: Secondary | ICD-10-CM

## 2016-08-16 LAB — BASIC METABOLIC PANEL
BUN: 19 mg/dL (ref 7–25)
CALCIUM: 9.2 mg/dL (ref 8.6–10.4)
CO2: 27 mmol/L (ref 20–31)
CREATININE: 1.04 mg/dL — AB (ref 0.60–0.88)
Chloride: 107 mmol/L (ref 98–110)
Glucose, Bld: 112 mg/dL — ABNORMAL HIGH (ref 65–99)
Potassium: 4.4 mmol/L (ref 3.5–5.3)
Sodium: 143 mmol/L (ref 135–146)

## 2016-08-16 NOTE — Progress Notes (Signed)
Pre visit review using our clinic review tool, if applicable. No additional management support is needed unless otherwise documented below in the visit note. 

## 2016-08-16 NOTE — Progress Notes (Signed)
HPI:  Follow up:  HTN/Chronic LE edema/CKD: -meds:losartan 50 -did not tolerate norvasc (made swelling worse), does not want to take diuretic as makes her pee, hx report intol to acei -she had cardiology eval in 28-Jan-2016 per her request for the edema -denies:CP, SOB, DOE -still with some LE edema, does not like wearing compression, but agrees to try  HLD/Hyperglycemia/Obesity: -meds: fenofibrate, fish oil -hx reported intol to statin  GERD/PND: -pantoprazole, ranitidine -hx hiatal hernia -sees Dr. 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 Cruzita Lederer and on Prolia inj -last bone density 0/3474 -hx complication fosmax, on prolia for many years  OAB: -takes oxybutynin  -stable  OA/Chronic low back pain: -seeing ortho -hx of DDD, s/p remote disc surgery per her report -uses walker and needs wheelchair licence form signature  Depression: -started when son and husband died in 01/28/04 -on wellbutrin 75 mg daily for this -stable, no SI  Allergic rhinitis: -takes zyrtec daily  ROS: See pertinent positives and negatives per HPI.  Past Medical History:  Diagnosis Date  . Allergic rhinitis   . BCC (basal cell carcinoma of skin) 04/10/2015   sees dermatologist  . Depression 10/17/2012  . Ectopic pregnancy   . GERD (gastroesophageal reflux disease)    hx hiatal hernia, has seen GI, ENT and allergist in the past  . Hyperlipidemia   . Hypertension   . OA (osteoarthritis)    knees and back, hx DDD, s/p remote lumbar disc surgery  . OAB (overactive bladder) 04/10/2015  . Obesity   . Osteoporosis    on prolia in the past  . Stroke [facility])    sees neurologist, hx memory loss, expressive aphasia  . Venous insufficiency     Past Surgical History:  Procedure Laterality Date  . ABDOMINAL HYSTERECTOMY    .  APPENDECTOMY    . BREAST REDUCTION SURGERY    . LAPAROSCOPIC SALPINGOOPHERECTOMY    . NASAL MASS EXCISION     Basal Cell Cancer Excision  . NASAL RECONSTRUCTION    . TEE WITHOUT CARDIOVERSION  10/22/2012   Procedure: TRANSESOPHAGEAL ECHOCARDIOGRAM (TEE);  Surgeon: Josue Hector, MD;  Location: [facility] ENDOSCOPY;  Service: Cardiovascular;  Laterality: N/A;    Family History  Problem Relation Age of Onset  . Hypertension Father   . Hypertension Mother   . Cancer - Lung Brother     Social History   Social History  . Marital status: Widowed    Spouse name: N/A  . Number of children: 2  . Years of education: 12   Occupational History  . retired    Social History Main Topics  . Smoking status: Former Research scientist (life sciences)  . Smokeless tobacco: Never Used  . Alcohol use No  . Drug use: No  . Sexual activity: No   Other Topics Concern  . None   Social History Narrative   Patient is single with 2 children.   Patient is right handed.   Patient has 12 th grade education.   Patient does not drink caffeine.     Current Outpatient Prescriptions:  .  acetaminophen (TYLENOL) 500 MG tablet, Take 500 mg by mouth as needed for pain., Disp: , Rfl:  .  buPROPion (WELLBUTRIN) 75 MG tablet, take 1 tablet by mouth once daily WITH EVENING MEAL, Disp: 90 tablet, Rfl: 1 .  Calcium  Citrate-Vitamin D 250-200 MG-UNIT TABS, Take by mouth. Take two tabs daily, Disp: , Rfl:  .  cetirizine (ZYRTEC) 10 MG tablet, Take 10 mg by mouth daily., Disp: , Rfl:  .  cholecalciferol (VITAMIN D) 1000 units tablet, Take 2,000 Units by mouth 2 (two) times daily., Disp: , Rfl:  .  clopidogrel (PLAVIX) 75 MG tablet, Take 1 tablet (75 mg total) by mouth daily with breakfast., Disp: 90 tablet, Rfl: 3 .  Coenzyme Q10 (CO Q 10) 100 MG CAPS, Take 1 tablet by mouth daily. Hold while in hospital, Disp: , Rfl:  .  Cyanocobalamin (VITAMIN B12 PO), Take by mouth., Disp: , Rfl:  .  denosumab (PROLIA) 60 MG/ML SOLN injection, Inject 60 mg  into the skin every 6 (six) months. Administer in upper arm, thigh, or abdomen, Disp: , Rfl:  .  fenofibrate (TRICOR) 145 MG tablet, take 1 tablet by mouth once daily, Disp: 90 tablet, Rfl: 1 .  fluticasone (FLONASE) 50 MCG/ACT nasal spray, instill 1 spray into each nostril twice a day, Disp: 16 g, Rfl: 3 .  Glucosamine-Chondroitin (OSTEO BI-FLEX REGULAR STRENGTH) 250-200 MG TABS, Take 1 tablet by mouth daily. Hold while in hospital, Disp: , Rfl:  .  losartan (COZAAR) 50 MG tablet, Take 1 tablet (50 mg total) by mouth daily., Disp: 90 tablet, Rfl: 2 .  Multiple Vitamins-Minerals (CENTRUM SILVER PO), Take by mouth daily., Disp: , Rfl:  .  Multiple Vitamins-Minerals (PRESERVISION AREDS PO), Take 1 tablet by mouth 2 (two) times daily., Disp: , Rfl:  .  oxybutynin (DITROPAN-XL) 5 MG 24 hr tablet, take 1 tablet by mouth once daily, Disp: 90 tablet, Rfl: 3 .  pantoprazole (PROTONIX) 20 MG tablet, Take 1 tablet (20 mg total) by mouth daily., Disp: 90 tablet, Rfl: 3 .  ranitidine (ZANTAC) 300 MG tablet, , Disp: , Rfl:  .  UNABLE TO FIND, Med Name: fiber supplement BID, Disp: , Rfl:   EXAM:  Vitals:   08/16/16 1504  BP: 138/60  Pulse: 74  Temp: 97.7 F (36.5 C)    Body mass index is 38.49 kg/m.  GENERAL: vitals reviewed and listed above, alert, oriented, appears well hydrated and in no acute distress  HEENT: atraumatic, conjunttiva clear, no obvious abnormalities on inspection of external nose and ears  NECK: no obvious masses on inspection  LUNGS: clear to auscultation bilaterally, no wheezes, rales or rhonchi, good air movement  CV: HRRR, 2+ bilat le edema  MS: moves all extremities without noticeable abnormality  PSYCH: pleasant and cooperative, no obvious depression or anxiety  ASSESSMENT AND PLAN:  Discussed the following assessment and plan:  Essential hypertension - Plan: Basic metabolic panel, CANCELED: Basic metabolic panel  Leg edema  Hyperlipidemia, unspecified  hyperlipidemia type  Gastroesophageal reflux disease, esophagitis presence not specified  Mood disorder (HCC)  CKD (chronic kidney disease) stage 3, GFR 30-59 ml/min  Encounter for immunization - Plan: Flu vaccine HIGH DOSE PF  -flu shot -lifestyle recs -labs -handicap license for their car form completed -compression advised -Patient advised to return or notify a doctor immediately if symptoms worsen or persist or new concerns arise.  Patient Instructions  BEFORE YOU LEAVE: -follow up: Medicare exam in January -flu shot -complete/copy/return form to pt  Please try compression socks if you can.  We have ordered labs or studies at this visit. It can take up to 1-2 weeks for results and processing. IF results require follow up or explanation, we will call you with instructions. Clinically  stable results will be released to your [facility]. If you have not heard from [place] or cannot find your results in [facility] in 2 weeks please contact our office at [phone].  If you are not yet signed up for [facility], please consider signing up.           Colin Benton R., DO

## 2016-08-16 NOTE — Patient Instructions (Signed)
BEFORE YOU LEAVE: -follow up: Medicare exam in January -flu shot -complete/copy/return form to pt  Please try compression socks if you can.  We have ordered labs or studies at this visit. It can take up to 1-2 weeks for results and processing. IF results require follow up or explanation, we will call you with instructions. Clinically stable results will be released to your [facility]. If you have not heard from [place] or cannot find your results in [facility] in 2 weeks please contact our office at [phone].  If you are not yet signed up for [facility], please consider signing up.

## 2016-08-20 ENCOUNTER — Other Ambulatory Visit: Payer: Self-pay | Admitting: Family Medicine

## 2016-08-30 ENCOUNTER — Telehealth: Payer: Self-pay | Admitting: Internal Medicine

## 2016-08-30 NOTE — Telephone Encounter (Signed)
Pt is ready to make the appt for her prolia, is she clear to do it?

## 2016-09-03 NOTE — Telephone Encounter (Signed)
I have electronically submitted pt's info for Prolia insurance verification and will notify you once I have a response. Thank you. °

## 2016-09-17 NOTE — Telephone Encounter (Signed)
I have rec'd Ms. [employer] insurance verification for Prolia and she has an estimated responsibility of $195.  Please make pt aware this in an estimate and we will not know an exact amt until insurance(s) has/have paid.  I have sent a copy of the summary of benefits to be scanned into pt's chart.  If pt cannot afford $195 for her injection, please advise her to contact Prol[employer] at [phone] and select option #1 to see if she qualifies for one of their assistance programs.  If she qualifies they will instruct her how to proceed.  If you have any questions, please let me know. Thank you.

## 2016-09-17 NOTE — Telephone Encounter (Deleted)
I have rec'd Ms. [employer] insurance verification for Prolia and she has an estimated responsibility of $195.  Please make pt aware this is an estimate and we will not know an exact amt until insurance(s) has/have paid.  I have sent a copy of the summary of benefits to be scanned into pt's chart.      If pt cannot afford $195 for her injection, please advise her to contact [employer] at [phone] and select option #1 to see if she qualifies for one of their assistance programs.  If she qualifies they will instruct her how to proceed.  If you have any questions, please let me know. Thank you.

## 2016-09-24 DIAGNOSIS — M1712 Unilateral primary osteoarthritis, left knee: Secondary | ICD-10-CM | POA: Diagnosis not present

## 2016-10-02 ENCOUNTER — Telehealth: Payer: Self-pay | Admitting: Internal Medicine

## 2016-10-02 NOTE — Telephone Encounter (Signed)
Pt's benefits have been checked she has a $0copay for prolia. Pt has been contacted and has a appt for the inj on 12/12 at 2 pm

## 2016-10-08 ENCOUNTER — Ambulatory Visit (INDEPENDENT_AMBULATORY_CARE_PROVIDER_SITE_OTHER): Payer: Medicare Other

## 2016-10-08 DIAGNOSIS — M810 Age-related osteoporosis without current pathological fracture: Secondary | ICD-10-CM | POA: Diagnosis not present

## 2016-10-08 MED ORDER — DENOSUMAB 60 MG/ML ~~LOC~~ SOLN: 60.0000 mg | Freq: Once | SUBCUTANEOUS | Status: DC

## 2016-10-08 MED ORDER — DENOSUMAB 60 MG/ML ~~LOC~~ SOLN
60.0000 mg | Freq: Once | SUBCUTANEOUS | Status: AC
  Administered 2016-10-08: 60 mg via SUBCUTANEOUS

## 2016-10-29 DIAGNOSIS — G8929 Other chronic pain: Secondary | ICD-10-CM | POA: Diagnosis not present

## 2016-10-29 DIAGNOSIS — M7062 Trochanteric bursitis, left hip: Secondary | ICD-10-CM | POA: Diagnosis not present

## 2016-10-29 DIAGNOSIS — M25511 Pain in right shoulder: Secondary | ICD-10-CM | POA: Diagnosis not present

## 2016-10-29 DIAGNOSIS — M25552 Pain in left hip: Secondary | ICD-10-CM | POA: Diagnosis not present

## 2016-11-19 ENCOUNTER — Encounter: Payer: Self-pay | Admitting: Family Medicine

## 2016-11-19 ENCOUNTER — Other Ambulatory Visit: Payer: Self-pay | Admitting: Family Medicine

## 2016-11-19 ENCOUNTER — Ambulatory Visit (INDEPENDENT_AMBULATORY_CARE_PROVIDER_SITE_OTHER): Payer: Medicare Other | Admitting: Family Medicine

## 2016-11-19 VITALS — BP 128/74 | HR 65 | Temp 97.7°F | Ht <= 58 in | Wt 166.2 lb

## 2016-11-19 DIAGNOSIS — Z0000 Encounter for general adult medical examination without abnormal findings: Secondary | ICD-10-CM | POA: Diagnosis not present

## 2016-11-19 DIAGNOSIS — I10 Essential (primary) hypertension: Secondary | ICD-10-CM | POA: Diagnosis not present

## 2016-11-19 DIAGNOSIS — Z8673 Personal history of transient ischemic attack (TIA), and cerebral infarction without residual deficits: Secondary | ICD-10-CM | POA: Diagnosis not present

## 2016-11-19 DIAGNOSIS — F39 Unspecified mood [affective] disorder: Secondary | ICD-10-CM

## 2016-11-19 DIAGNOSIS — E785 Hyperlipidemia, unspecified: Secondary | ICD-10-CM | POA: Diagnosis not present

## 2016-11-19 DIAGNOSIS — M810 Age-related osteoporosis without current pathological fracture: Secondary | ICD-10-CM

## 2016-11-19 DIAGNOSIS — K219 Gastro-esophageal reflux disease without esophagitis: Secondary | ICD-10-CM

## 2016-11-19 DIAGNOSIS — N183 Chronic kidney disease, stage 3 unspecified: Secondary | ICD-10-CM

## 2016-11-19 DIAGNOSIS — R600 Localized edema: Secondary | ICD-10-CM

## 2016-11-19 DIAGNOSIS — Z6837 Body mass index (BMI) 37.0-37.9, adult: Secondary | ICD-10-CM

## 2016-11-19 NOTE — Patient Instructions (Signed)
BEFORE YOU LEAVE: -follow up: 3-4 months -labs  We have ordered labs or studies at this visit. It can take up to 1-2 weeks for results and processing. IF results require follow up or explanation, we will call you with instructions. Clinically stable results will be released to your MYCHART. If you have not heard from us or cannot find your results in MYCHART in 2 weeks please contact our office at [phone].  If you are not yet signed up for [facility], please consider signing up.   We recommend the following healthy lifestyle for LIFE: 1) Small portions.   Tip: eat off of a salad plate instead of a dinner plate.  Tip: if you need more or a snack choose fruits, veggies and/or a handful of nuts or seeds.  2) Eat a healthy clean diet.  * Tip: Avoid (less then 1 serving per week): processed foods, sweets, sweetened drinks, white starches (rice, flour, bread, potatoes, pasta, etc), red meat, fast foods, butter  *Tip: CHOOSE instead   * 5-9 servings per day of fresh or frozen fruits and vegetables (but not corn, potatoes, bananas, canned or dried fruit)   *nuts and seeds, beans   *olives and olive oil   *small portions of lean meats such as fish and white chicken    *small portions of whole grains  3)Get at least 150 minutes of sweaty aerobic exercise per week.  4)Reduce stress - consider counseling, meditation and relaxation to balance other aspects of your life.          

## 2016-11-19 NOTE — Progress Notes (Signed)
Pre visit review using our clinic review tool, if applicable. No additional management support is needed unless otherwise documented below in the visit note. 

## 2016-11-19 NOTE — Progress Notes (Signed)
Medicare Annual Preventive Care Visit  (initial annual wellness or annual wellness exam)  Concerns and/or follow up today:  [facility] is an 83 year old female with past medical history significant for hypertension, chronic lower extremity edema, chronic kidney disease, hyperlipidemia, hyperglycemia, obesity, acid reflux, postnasal drip, history of stroke, osteoporosis, vitamin D deficiency, overactive bladder, osteoarthritis and chronic low back pain, major depressive disorder and allergic rhinitis here for her annual wellness visit. She sees Dr. Cruzita Lederer for her osteoporosis and vitamin D deficiency. Sees Dr. Gwenlyn Found for her heart and cardiovascular health. She has not concerns today. She is fasting. Due for lipids, BMP, cbc.   ROS: negative for report of fevers, unintentional weight loss, vision changes, vision loss, hearing loss or change, chest pain, sob, hemoptysis, melena, hematochezia, hematuria, genital discharge or lesions, falls, bleeding or bruising, loc, thoughts of suicide or self harm, memory loss  1.) Patient-completed health risk assessment  - completed and reviewed, see scanned documentation  2.) Review of Medical History: -PMH, PSH, Family History and current specialty and care providers reviewed and updated and listed below  - see scanned in document in chart and below  Past Medical History:  Diagnosis Date  . Allergic rhinitis   . BCC (basal cell carcinoma of skin) 04/10/2015   sees dermatologist  . Depression 10/17/2012  . Ectopic pregnancy   . GERD (gastroesophageal reflux disease)    hx hiatal hernia, has seen GI, ENT and allergist in the past  . Hyperlipidemia   . Hypertension   . OA (osteoarthritis)    knees and back, hx DDD, s/p remote lumbar disc surgery  . OAB (overactive bladder) 04/10/2015  . Obesity   . Osteoporosis    on prolia in the past  . Stroke [facility])    sees neurologist, hx memory loss, expressive aphasia  . Venous insufficiency     Past  Surgical History:  Procedure Laterality Date  . ABDOMINAL HYSTERECTOMY    . APPENDECTOMY    . BREAST REDUCTION SURGERY    . LAPAROSCOPIC SALPINGOOPHERECTOMY    . NASAL MASS EXCISION     Basal Cell Cancer Excision  . NASAL RECONSTRUCTION    . TEE WITHOUT CARDIOVERSION  10/22/2012   Procedure: TRANSESOPHAGEAL ECHOCARDIOGRAM (TEE);  Surgeon: Josue Hector, MD;  Location: [facility] ENDOSCOPY;  Service: Cardiovascular;  Laterality: N/A;    Social History   Social History  . Marital status: Widowed    Spouse name: N/A  . Number of children: 2  . Years of education: 12   Occupational History  . retired    Social History Main Topics  . Smoking status: Former Research scientist (life sciences)  . Smokeless tobacco: Never Used  . Alcohol use No  . Drug use: No  . Sexual activity: No   Other Topics Concern  . Not on file   Social History Narrative   Patient is single with 2 children.   Patient is right handed.   Patient has 12 th grade education.   Patient does not drink caffeine.    Family History  Problem Relation Age of Onset  . Hypertension Father   . Hypertension Mother   . Cancer - Lung Brother     Current Outpatient Prescriptions on File Prior to Visit  Medication Sig Dispense Refill  . acetaminophen (TYLENOL) 500 MG tablet Take 500 mg by mouth as needed for pain.    Marland Kitchen buPROPion (WELLBUTRIN) 75 MG tablet take 1 tablet by mouth once daily WITH EVENING MEAL 90 tablet 1  .  Calcium Citrate-Vitamin D 250-200 MG-UNIT TABS Take by mouth. Take two tabs daily    . cetirizine (ZYRTEC) 10 MG tablet Take 10 mg by mouth daily.    . cholecalciferol (VITAMIN D) 1000 units tablet Take 2,000 Units by mouth 2 (two) times daily.    . clopidogrel (PLAVIX) 75 MG tablet Take 1 tablet (75 mg total) by mouth daily with breakfast. 90 tablet 3  . Coenzyme Q10 (CO Q 10) 100 MG CAPS Take 1 tablet by mouth daily. Hold while in hospital    . Cyanocobalamin (VITAMIN B12 PO) Take by mouth.    . denosumab (PROLIA) 60 MG/ML SOLN  injection Inject 60 mg into the skin every 6 (six) months. Administer in upper arm, thigh, or abdomen    . fenofibrate (TRICOR) 145 MG tablet take 1 tablet by mouth once daily 90 tablet 1  . fluticasone (FLONASE) 50 MCG/ACT nasal spray instill 1 spray into each nostril twice a day 16 g 3  . Glucosamine-Chondroitin (OSTEO BI-FLEX REGULAR STRENGTH) 250-200 MG TABS Take 1 tablet by mouth daily. Hold while in hospital    . Multiple Vitamins-Minerals (CENTRUM SILVER PO) Take by mouth daily.    . Multiple Vitamins-Minerals (PRESERVISION AREDS PO) Take 1 tablet by mouth 2 (two) times daily.    Marland Kitchen oxybutynin (DITROPAN-XL) 5 MG 24 hr tablet take 1 tablet by mouth once daily 90 tablet 3  . pantoprazole (PROTONIX) 20 MG tablet take 1 tablet by mouth once daily 90 tablet 2  . ranitidine (ZANTAC) 300 MG tablet     . UNABLE TO FIND Med Name: fiber supplement BID     No current facility-administered medications on file prior to visit.      3.) Review of functional ability and level of safety:  Any difficulty hearing?  See scanned documentation  History of falling?  See scanned documentation  Any trouble with IADLs - using a phone, using transportation, grocery shopping, preparing meals, doing housework, doing laundry, taking medications and managing money?  See scanned documentation  Advance Directives?  She has set up already.  See summary of recommendations in Patient Instructions below.  4.) Physical Exam Vitals:   11/19/16 1503  BP: 128/74  Pulse: 65  Temp: 97.7 F (36.5 C)   Estimated body mass index is 37.26 kg/m as calculated from the following:   Height as of this encounter: 4\' 8"  (1.422 m).   Weight as of this encounter: 166 lb 3.2 oz (75.4 kg). Body mass index is 37.26 kg/m.  EKG (optional): deferred  General: alert, appear well hydrated and in no acute distress  HEENT: visual acuity grossly intact  CV: HRRR  Lungs: CTA bilaterally  Psych: pleasant and cooperative,  no obvious depression or anxiety  Cognitive function grossly intact  See patient instructions for recommendations.  Education and counseling regarding the above review of health provided with a plan for the following: -see scanned patient completed form for further details -fall prevention strategies discussed  -healthy lifestyle discussed -see patient instructions below for any other recommendations provided  4)The following written screening schedule of preventive measures were reviewed with assessment and plan made per below, orders and patient instructions:        Alcohol screening done     Obesity Screening and counseling done     STI screening (Hep C if born 1943-65) offered and per pt wishes declined, not sexually active     Tobacco Screening done        Pneumococcal (PPSV23 -  one dose after 64, one before if risk factors), influenza yearly and hepatitis B vaccines (if high risk - end stage renal disease, IV drugs, homosexual men, live in home for mentally retarded, hemophilia receiving factors) ASSESSMENT/PLAN: done      Screening mammograph (yearly if >40) ASSESSMENT/PLAN: refused further      Screening Pap smear/pelvic exam (q2 years) ASSESSMENT/PLAN: refused further      Colorectal cancer screening (FOBT yearly or flex sig q4y or colonoscopy q10y or barium enema q4y) ASSESSMENT/PLAN: aged out and refused further      Bone mass measurements(covered q2y if indicated - estrogen def, osteoporosis, hyperparathyroid, vertebral abnormalities, osteoporosis or steroids) ASSESSMENT/PLAN: sees Dr. Cruzita Lederer for bone health      Screening for glaucoma(q1y if high risk - diabetes, FH, AA and > 29 or hispanic and > 65) ASSESSMENT/PLAN: utd per her report, sees Dr. Bing Plume      Medical nutritional therapy for individuals with diabetes or renal disease ASSESSMENT/PLAN: n/a      Cardiovascular screening blood tests (lipids q5y) ASSESSMENT/PLAN: see orders and labs      Diabetes  screening tests ASSESSMENT/PLAN: see orders and labs   7.) Summary:   Medicare annual wellness visit, subsequent -risk factors and conditions per above assessment were discussed and treatment, recommendations and referrals were offered per documentation above and orders and patient instructions.  Essential hypertension - Plan: Basic metabolic panel, CBC (no diff) -mildly elevated on arrival by adequate on 5 minute seated check -cont current tx, labs, monitor  Hyperlipidemia, unspecified hyperlipidemia type - Plan: Lipid Panel -lifestyle rec, summary med diet provided  H/O: CVA (cerebrovascular accident) -lifestyle recs, cont current tx  Gastroesophageal reflux disease, esophagitis presence not specified -stable  BMI 37.0-37.9, adult -lifestyle recs  Mood disorder (HCC) -stable  Age-related osteoporosis without current pathological fracture -sees endo for this  Leg edema -stable  CKD (chronic kidney disease) stage 3, GFR 30-59 ml/min -labs  Patient Instructions  BEFORE YOU LEAVE: -follow up: 3-4 months -labs  We have ordered labs or studies at this visit. It can take up to 1-2 weeks for results and processing. IF results require follow up or explanation, we will call you with instructions. Clinically stable results will be released to your [facility]. If you have not heard from [place] or cannot find your results in [facility] in 2 weeks please contact our office at [phone].  If you are not yet signed up for [facility], please consider signing up.    We recommend the following healthy lifestyle for LIFE: 1) Small portions.   Tip: eat off of a salad plate instead of a dinner plate.  Tip: if you need more or a snack choose fruits, veggies and/or a handful of nuts or seeds.  2) Eat a healthy clean diet.  * Tip: Avoid (less then 1 serving per week): processed foods, sweets, sweetened drinks, white starches (rice, flour, bread, potatoes, pasta, etc), red meat, fast foods,  butter  *Tip: CHOOSE instead   * 5-9 servings per day of fresh or frozen fruits and vegetables (but not corn, potatoes, bananas, canned or dried fruit)   *nuts and seeds, beans   *olives and olive oil   *small portions of lean meats such as fish and white chicken    *small portions of whole grains  3)Get at least 150 minutes of sweaty aerobic exercise per week.  4)Reduce stress - consider counseling, meditation and relaxation to balance other aspects of your life.  Colin Benton R., DO

## 2016-11-20 LAB — LIPID PANEL
Cholesterol: 184 mg/dL (ref 0–200)
HDL: 79.2 mg/dL (ref >39.00)
LDL Cholesterol: 85 mg/dL (ref 0–99)
NonHDL: 104.3
TRIGLYCERIDES: 98 mg/dL (ref 0.0–149.0)
Total CHOL/HDL Ratio: 2
VLDL: 19.6 mg/dL (ref 0.0–40.0)

## 2016-11-20 LAB — CBC
HCT: 38.1 % (ref 36.0–46.0)
HEMOGLOBIN: 12.5 g/dL (ref 12.0–15.0)
MCHC: 32.7 g/dL (ref 30.0–36.0)
MCV: 90.3 fl (ref 78.0–100.0)
PLATELETS: 263 10*3/uL (ref 150.0–400.0)
RBC: 4.22 Mil/uL (ref 3.87–5.11)
RDW: 14.9 % (ref 11.5–15.5)
WBC: 7.8 10*3/uL (ref 4.0–10.5)

## 2016-11-20 LAB — BASIC METABOLIC PANEL
BUN: 20 mg/dL (ref 6–23)
CALCIUM: 8.7 mg/dL (ref 8.4–10.5)
CO2: 27 meq/L (ref 19–32)
Chloride: 109 mEq/L (ref 96–112)
Creatinine, Ser: 1.05 mg/dL (ref 0.40–1.20)
GFR: 52.7 mL/min — ABNORMAL LOW (ref >60.00)
GLUCOSE: 91 mg/dL (ref 70–99)
Potassium: 4.3 mEq/L (ref 3.5–5.1)
SODIUM: 142 meq/L (ref 135–145)

## 2016-12-07 ENCOUNTER — Other Ambulatory Visit: Payer: Self-pay | Admitting: Family Medicine

## 2016-12-21 ENCOUNTER — Other Ambulatory Visit: Payer: Self-pay | Admitting: Family Medicine

## 2016-12-23 ENCOUNTER — Ambulatory Visit (INDEPENDENT_AMBULATORY_CARE_PROVIDER_SITE_OTHER): Payer: Medicare Other | Admitting: Family Medicine

## 2016-12-23 ENCOUNTER — Encounter: Payer: Self-pay | Admitting: Family Medicine

## 2016-12-23 VITALS — BP 152/80 | HR 80 | Temp 98.3°F | Ht <= 58 in

## 2016-12-23 DIAGNOSIS — L03116 Cellulitis of left lower limb: Secondary | ICD-10-CM | POA: Diagnosis not present

## 2016-12-23 DIAGNOSIS — I872 Venous insufficiency (chronic) (peripheral): Secondary | ICD-10-CM | POA: Diagnosis not present

## 2016-12-23 MED ORDER — DOXYCYCLINE HYCLATE 100 MG PO TABS: 100.0000 mg | ORAL_TABLET | Freq: Two times a day (BID) | ORAL | 0 refills | Status: DC

## 2016-12-23 NOTE — Patient Instructions (Addendum)
  Take the antibiotic (doxycycline) as instructed on bottle. Do not get in the sun AT ALL while taking this medication.  Elevate legs for 30 minutes twice daily  Long term, you will need to wear compression  Lasix 20mg  daily for 1 week, then as needed  Follow up in 1 week if any redness persists, sooner if worsening

## 2016-12-23 NOTE — Progress Notes (Signed)
Pre visit review using our clinic review tool, if applicable. No additional management support is needed unless otherwise documented below in the visit note. 

## 2016-12-23 NOTE — Progress Notes (Signed)
HPI:  Acute visit for:  LE edema: -chronic issue -does not elevate, does no wear compression, does not like to take diuretic -some erythema L LE -no fevers, malaise, SOB, DOE, orthopnea  ROS: See pertinent positives and negatives per HPI.  Past Medical History:  Diagnosis Date  . Allergic rhinitis   . BCC (basal cell carcinoma of skin) 04/10/2015   sees dermatologist  . Depression 10/17/2012  . Ectopic pregnancy   . GERD (gastroesophageal reflux disease)    hx hiatal hernia, has seen GI, ENT and allergist in the past  . Hyperlipidemia   . Hypertension   . OA (osteoarthritis)    knees and back, hx DDD, s/p remote lumbar disc surgery  . OAB (overactive bladder) 04/10/2015  . Obesity   . Osteoporosis    on prolia in the past  . Stroke [facility])    sees neurologist, hx memory loss, expressive aphasia  . Venous insufficiency     Past Surgical History:  Procedure Laterality Date  . ABDOMINAL HYSTERECTOMY    . APPENDECTOMY    . BREAST REDUCTION SURGERY    . LAPAROSCOPIC SALPINGOOPHERECTOMY    . NASAL MASS EXCISION     Basal Cell Cancer Excision  . NASAL RECONSTRUCTION    . TEE WITHOUT CARDIOVERSION  10/22/2012   Procedure: TRANSESOPHAGEAL ECHOCARDIOGRAM (TEE);  Surgeon: Josue Hector, MD;  Location: [facility] ENDOSCOPY;  Service: Cardiovascular;  Laterality: N/A;    Family History  Problem Relation Age of Onset  . Hypertension Father   . Hypertension Mother   . Cancer - Lung Brother     Social History   Social History  . Marital status: Widowed    Spouse name: N/A  . Number of children: 2  . Years of education: 12   Occupational History  . retired    Social History Main Topics  . Smoking status: Former Research scientist (life sciences)  . Smokeless tobacco: Never Used  . Alcohol use No  . Drug use: No  . Sexual activity: No   Other Topics Concern  . None   Social History Narrative   Patient is single with 2 children.   Patient is right handed.   Patient has 12 th grade education.   Patient does not drink caffeine.     Current Outpatient Prescriptions:  .  acetaminophen (TYLENOL) 500 MG tablet, Take 500 mg by mouth as needed for pain., Disp: , Rfl:  .  buPROPion (WELLBUTRIN) 75 MG tablet, take 1 tablet by mouth once daily WITH EVENING MEAL, Disp: 90 tablet, Rfl: 1 .  Calcium Citrate-Vitamin D 250-200 MG-UNIT TABS, Take by mouth. Take two tabs daily, Disp: , Rfl:  .  cetirizine (ZYRTEC) 10 MG tablet, Take 10 mg by mouth daily., Disp: , Rfl:  .  cholecalciferol (VITAMIN D) 1000 units tablet, Take 2,000 Units by mouth 2 (two) times daily., Disp: , Rfl:  .  clopidogrel (PLAVIX) 75 MG tablet, Take 1 tablet (75 mg total) by mouth daily with breakfast., Disp: 90 tablet, Rfl: 3 .  Coenzyme Q10 (CO Q 10) 100 MG CAPS, Take 1 tablet by mouth daily. Hold while in hospital, Disp: , Rfl:  .  Cyanocobalamin (VITAMIN B12 PO), Take by mouth., Disp: , Rfl:  .  denosumab (PROLIA) 60 MG/ML SOLN injection, Inject 60 mg into the skin every 6 (six) months. Administer in upper arm, thigh, or abdomen, Disp: , Rfl:  .  fenofibrate (TRICOR) 145 MG tablet, take 1 tablet by mouth once daily, Disp: 90 tablet,  Rfl: 1 .  fluticasone (FLONASE) 50 MCG/ACT nasal spray, instill 1 spray into each nostril twice a day, Disp: 16 g, Rfl: 3 .  Glucosamine-Chondroitin (OSTEO BI-FLEX REGULAR STRENGTH) 250-200 MG TABS, Take 1 tablet by mouth daily. Hold while in hospital, Disp: , Rfl:  .  losartan (COZAAR) 50 MG tablet, take 1 tablet by mouth once daily, Disp: 90 tablet, Rfl: 2 .  Multiple Vitamins-Minerals (CENTRUM SILVER PO), Take by mouth daily., Disp: , Rfl:  .  Multiple Vitamins-Minerals (PRESERVISION AREDS PO), Take 1 tablet by mouth 2 (two) times daily., Disp: , Rfl:  .  oxybutynin (DITROPAN-XL) 5 MG 24 hr tablet, take 1 tablet by mouth once daily, Disp: 90 tablet, Rfl: 1 .  pantoprazole (PROTONIX) 20 MG tablet, take 1 tablet by mouth once daily, Disp: 90 tablet, Rfl: 2 .  ranitidine (ZANTAC) 300 MG tablet, ,  Disp: , Rfl:  .  UNABLE TO FIND, Med Name: fiber supplement BID, Disp: , Rfl:  .  doxycycline (VIBRA-TABS) 100 MG tablet, Take 1 tablet (100 mg total) by mouth 2 (two) times daily., Disp: 10 tablet, Rfl: 0  EXAM:  Vitals:   12/23/16 1518  BP: (!) 152/80  Pulse: 80  Temp: 98.3 F (36.8 C)    There is no height or weight on file to calculate BMI.  GENERAL: vitals reviewed and listed above, alert, oriented, appears well hydrated and in no acute distress  HEENT: atraumatic, conjunttiva clear, no obvious abnormalities on inspection of external nose and ears  NECK: no obvious masses on inspection, no JVD  LUNGS: clear to auscultation bilaterally, no wheezes, rales or rhonchi, good air movement  CV: HRRR, bilat 1+ pitting edema ankles and lower legs to mid calf, small patch erythema of skin L ant lower leg  MS: moves all extremities without noticeable abnormality  SKIN: small grapefruit sized patchy erythema.mild warmth L ant lower leg  PSYCH: pleasant and cooperative, no obvious depression or anxiety  ASSESSMENT AND PLAN:  Discussed the following assessment and plan:  Venous insufficiency  Cellulitis of left lower extremity  -? Venous stasis dermatitis or mild developing cellulitis - daughter and pt prefer to try short course abx -advised elevation, compression, lasix for 1 week 20mg  daily -follow up 1 week if any remaining redness - sooner prn -rx for compression and donner provided -follow up UP at follow up -Patient advised to return or notify a doctor immediately if symptoms worsen or persist or new concerns arise.  Patient Instructions   Take the antibiotic (doxycycline) as instructed on bottle. Do not get in the sun AT ALL while taking this medication.  Elevate legs for 30 minutes twice daily  Long term, you will need to wear compression  Lasix 20mg  daily for 1 week, then as needed  Follow up in 1 week if any redness persists, sooner if worsening   Colin Benton R., DO

## 2016-12-27 DIAGNOSIS — M25562 Pain in left knee: Secondary | ICD-10-CM | POA: Diagnosis not present

## 2016-12-27 DIAGNOSIS — M1712 Unilateral primary osteoarthritis, left knee: Secondary | ICD-10-CM | POA: Diagnosis not present

## 2016-12-27 DIAGNOSIS — G8929 Other chronic pain: Secondary | ICD-10-CM | POA: Diagnosis not present

## 2017-01-03 ENCOUNTER — Other Ambulatory Visit: Payer: Self-pay | Admitting: Family Medicine

## 2017-01-14 ENCOUNTER — Other Ambulatory Visit: Payer: Self-pay | Admitting: *Deleted

## 2017-01-14 MED ORDER — BUPROPION HCL 75 MG PO TABS: ORAL_TABLET | ORAL | 1 refills | Status: DC

## 2017-01-14 NOTE — Telephone Encounter (Signed)
Rx done. 

## 2017-01-31 ENCOUNTER — Encounter: Payer: Self-pay | Admitting: Family Medicine

## 2017-01-31 ENCOUNTER — Ambulatory Visit: Payer: Medicare Other | Admitting: Family Medicine

## 2017-01-31 ENCOUNTER — Ambulatory Visit (INDEPENDENT_AMBULATORY_CARE_PROVIDER_SITE_OTHER): Payer: Medicare Other | Admitting: Family Medicine

## 2017-01-31 VITALS — BP 140/70 | HR 77 | Temp 97.4°F | Ht <= 58 in | Wt 174.0 lb

## 2017-01-31 DIAGNOSIS — R600 Localized edema: Secondary | ICD-10-CM | POA: Diagnosis not present

## 2017-01-31 DIAGNOSIS — L03116 Cellulitis of left lower limb: Secondary | ICD-10-CM | POA: Diagnosis not present

## 2017-01-31 DIAGNOSIS — R590 Localized enlarged lymph nodes: Secondary | ICD-10-CM

## 2017-01-31 MED ORDER — DOXYCYCLINE HYCLATE 100 MG PO TABS: 100.0000 mg | ORAL_TABLET | Freq: Two times a day (BID) | ORAL | 0 refills | Status: DC

## 2017-01-31 MED ORDER — FUROSEMIDE 20 MG PO TABS: 20.0000 mg | ORAL_TABLET | Freq: Every day | ORAL | 3 refills | Status: DC

## 2017-01-31 NOTE — Progress Notes (Signed)
Pre visit review using our clinic review tool, if applicable. No additional management support is needed unless otherwise documented below in the visit note. 

## 2017-01-31 NOTE — Patient Instructions (Addendum)
BEFORE YOU LEAVE: -follow up: 1 week   Take the doxycycline twice daily for 3 days.  Elevate legs for 30 minutes twice daily.  Lasix 20mg  twice daily for 3-5 days, then back to 20mg  daily.  Compression for life  See your ear , nose and throat doctor about the next swelling

## 2017-01-31 NOTE — Progress Notes (Signed)
HPI:   Acute visit for LE edema: -longstanding bilat LE edema -has intermittent worsening and chronic edema -this improved off norvasc -she saw cardiologist for this in 2017 as has a lot of anxiety about it -at last visit was not using compression or lasix as was doing better -today reports: increased swelling bilat x 1 week and some redness R lower leg -denies:fevers, malaise, pain, SOB, DOE, CP -taking lasix 20mg  daily, using compression somtimes  LAD neck: -x at least 1 year -hx oral surgery and jaw surgery - thinks started then but not sure -saw dentist and he advised ENT eval -has ENT, Dr Carmelina Peal, has not seen him for this -no dysphagia, fevers, wt loss, pain  ROS: See pertinent positives and negatives per HPI.  Past Medical History:  Diagnosis Date  . Allergic rhinitis   . BCC (basal cell carcinoma of skin) 04/10/2015   sees dermatologist  . Depression 10/17/2012  . Ectopic pregnancy   . GERD (gastroesophageal reflux disease)    hx hiatal hernia, has seen GI, ENT and allergist in the past  . Hyperlipidemia   . Hypertension   . OA (osteoarthritis)    knees and back, hx DDD, s/p remote lumbar disc surgery  . OAB (overactive bladder) 04/10/2015  . Obesity   . Osteoporosis    on prolia in the past  . Stroke [facility])    sees neurologist, hx memory loss, expressive aphasia  . Venous insufficiency     Past Surgical History:  Procedure Laterality Date  . ABDOMINAL HYSTERECTOMY    . APPENDECTOMY    . BREAST REDUCTION SURGERY    . LAPAROSCOPIC SALPINGOOPHERECTOMY    . NASAL MASS EXCISION     Basal Cell Cancer Excision  . NASAL RECONSTRUCTION    . TEE WITHOUT CARDIOVERSION  10/22/2012   Procedure: TRANSESOPHAGEAL ECHOCARDIOGRAM (TEE);  Surgeon: Josue Hector, MD;  Location: [facility] ENDOSCOPY;  Service: Cardiovascular;  Laterality: N/A;    Family History  Problem Relation Age of Onset  . Hypertension Father   . Hypertension Mother   . Cancer - Lung Brother     Social  History   Social History  . Marital status: Widowed    Spouse name: N/A  . Number of children: 2  . Years of education: 12   Occupational History  . retired    Social History Main Topics  . Smoking status: Former Research scientist (life sciences)  . Smokeless tobacco: Never Used  . Alcohol use No  . Drug use: No  . Sexual activity: No   Other Topics Concern  . None   Social History Narrative   Patient is single with 2 children.   Patient is right handed.   Patient has 12 th grade education.   Patient does not drink caffeine.     Current Outpatient Prescriptions:  .  acetaminophen (TYLENOL) 500 MG tablet, Take 500 mg by mouth as needed for pain., Disp: , Rfl:  .  buPROPion (WELLBUTRIN) 75 MG tablet, take 1 tablet by mouth once daily WITH EVENING MEAL, Disp: 90 tablet, Rfl: 1 .  Calcium Citrate-Vitamin D 250-200 MG-UNIT TABS, Take by mouth. Take two tabs daily, Disp: , Rfl:  .  cetirizine (ZYRTEC) 10 MG tablet, Take 10 mg by mouth daily., Disp: , Rfl:  .  cholecalciferol (VITAMIN D) 1000 units tablet, Take 2,000 Units by mouth 2 (two) times daily., Disp: , Rfl:  .  clopidogrel (PLAVIX) 75 MG tablet, take 1 tablet by mouth once daily with BREAKFAST, Disp:  90 tablet, Rfl: 3 .  Coenzyme Q10 (CO Q 10) 100 MG CAPS, Take 1 tablet by mouth daily. Hold while in hospital, Disp: , Rfl:  .  Cyanocobalamin (VITAMIN B12 PO), Take by mouth., Disp: , Rfl:  .  denosumab (PROLIA) 60 MG/ML SOLN injection, Inject 60 mg into the skin every 6 (six) months. Administer in upper arm, thigh, or abdomen, Disp: , Rfl:  .  fenofibrate (TRICOR) 145 MG tablet, take 1 tablet by mouth once daily, Disp: 90 tablet, Rfl: 1 .  fluticasone (FLONASE) 50 MCG/ACT nasal spray, instill 1 spray into each nostril twice a day, Disp: 16 g, Rfl: 3 .  Glucosamine-Chondroitin (OSTEO BI-FLEX REGULAR STRENGTH) 250-200 MG TABS, Take 1 tablet by mouth daily. Hold while in hospital, Disp: , Rfl:  .  losartan (COZAAR) 50 MG tablet, take 1 tablet by mouth  once daily, Disp: 90 tablet, Rfl: 2 .  Multiple Vitamins-Minerals (CENTRUM SILVER PO), Take by mouth daily., Disp: , Rfl:  .  Multiple Vitamins-Minerals (PRESERVISION AREDS PO), Take 1 tablet by mouth 2 (two) times daily., Disp: , Rfl:  .  oxybutynin (DITROPAN-XL) 5 MG 24 hr tablet, take 1 tablet by mouth once daily, Disp: 90 tablet, Rfl: 1 .  pantoprazole (PROTONIX) 20 MG tablet, take 1 tablet by mouth once daily, Disp: 90 tablet, Rfl: 2 .  ranitidine (ZANTAC) 300 MG tablet, , Disp: , Rfl:  .  UNABLE TO FIND, Med Name: fiber supplement BID, Disp: , Rfl:  .  doxycycline (VIBRA-TABS) 100 MG tablet, Take 1 tablet (100 mg total) by mouth 2 (two) times daily., Disp: 6 tablet, Rfl: 0 .  furosemide (LASIX) 20 MG tablet, Take 1 tablet (20 mg total) by mouth daily., Disp: 30 tablet, Rfl: 3  EXAM:  Vitals:   01/31/17 1536  BP: 140/70  Pulse: 77  Temp: 97.4 F (36.3 C)    Body mass index is 39.01 kg/m.  GENERAL: vitals reviewed and listed above, alert, oriented, appears well hydrated and in no acute distress  HEENT: atraumatic, conjunttiva clear, no obvious abnormalities on inspection of external nose and ears  NECK: no obvious masses on inspection, nontender subcut swelling submental region  LUNGS: clear to auscultation bilaterally, no wheezes, rales or rhonchi, good air movement  CV: HRRR,bilat LE edema to knees  SKIN: patch of mildly erythematous and mildly warm skin R ant le  MS: moves all extremities without noticeable abnormality  PSYCH: pleasant and cooperative, no obvious depression or anxiety  ASSESSMENT AND PLAN:  Discussed the following assessment and plan:  Leg edema  Cellulitis of left lower extremity  LAD (lymphadenopathy), submental  -elevation -increase lasix 20 to bid for 3-5 days, then back to once daily -doxy x 3 days -ENT eval advised for the submental LAD - daughter agrees to call as she already sees ENT - but not for this -follow up 1 week -Patient  advised to return or notify a doctor immediately if symptoms worsen or persist or new concerns arise.  Patient Instructions  BEFORE YOU LEAVE: -follow up: 1 week   Take the doxycycline twice daily for 3 days.  Elevate legs for 30 minutes twice daily.  Lasix 20mg  twice daily for 3-5 days, then back to 20mg  daily.  Compression for life  See your ear , nose and throat doctor about the next swelling   Colin Benton R., DO

## 2017-02-06 NOTE — Progress Notes (Signed)
HPI:  1 week f/u Venous stasis dermatitis with cellulitis: -did increase lasix, elevation, doxy last visit -reports: doing much better - redness gone, swelling much improved, did not wear compression or take lasix today (she really hates both of these measures and misses several times per week)  Ingrown toenail L: -great toe, occ pain and discomfort -wants to see podiatrist  Sleep disorder: -per daughter refuses to go to bed until 3-4 in the morning  -she has been this way her whole life, reports she prefers to sleep during the day and it used to drive her husband crazy but she does not want to change sleep habits -some snoring -daytime somnolence if schedule doesn't allow her to sleep during the day -she doesn't want to change sleep schedule and doesn't want testing or tx for sleep apnea  ROS: See pertinent positives and negatives per HPI.  Past Medical History:  Diagnosis Date  . Allergic rhinitis   . BCC (basal cell carcinoma of skin) 04/10/2015   sees dermatologist  . Depression 10/17/2012  . Ectopic pregnancy   . GERD (gastroesophageal reflux disease)    hx hiatal hernia, has seen GI, ENT and allergist in the past  . Hyperlipidemia   . Hypertension   . OA (osteoarthritis)    knees and back, hx DDD, s/p remote lumbar disc surgery  . OAB (overactive bladder) 04/10/2015  . Obesity   . Osteoporosis    on prolia in the past  . Stroke [facility])    sees neurologist, hx memory loss, expressive aphasia  . Venous insufficiency     Past Surgical History:  Procedure Laterality Date  . ABDOMINAL HYSTERECTOMY    . APPENDECTOMY    . BREAST REDUCTION SURGERY    . LAPAROSCOPIC SALPINGOOPHERECTOMY    . NASAL MASS EXCISION     Basal Cell Cancer Excision  . NASAL RECONSTRUCTION    . TEE WITHOUT CARDIOVERSION  10/22/2012   Procedure: TRANSESOPHAGEAL ECHOCARDIOGRAM (TEE);  Surgeon: Josue Hector, MD;  Location: [facility] ENDOSCOPY;  Service: Cardiovascular;  Laterality: N/A;    Family  History  Problem Relation Age of Onset  . Hypertension Father   . Hypertension Mother   . Cancer - Lung Brother     Social History   Social History  . Marital status: Widowed    Spouse name: N/A  . Number of children: 2  . Years of education: 12   Occupational History  . retired    Social History Main Topics  . Smoking status: Former Research scientist (life sciences)  . Smokeless tobacco: Never Used  . Alcohol use No  . Drug use: No  . Sexual activity: No   Other Topics Concern  . None   Social History Narrative   Patient is single with 2 children.   Patient is right handed.   Patient has 12 th grade education.   Patient does not drink caffeine.     Current Outpatient Prescriptions:  .  acetaminophen (TYLENOL) 500 MG tablet, Take 500 mg by mouth as needed for pain., Disp: , Rfl:  .  buPROPion (WELLBUTRIN) 75 MG tablet, take 1 tablet by mouth once daily WITH EVENING MEAL, Disp: 90 tablet, Rfl: 1 .  Calcium Citrate-Vitamin D 250-200 MG-UNIT TABS, Take by mouth. Take two tabs daily, Disp: , Rfl:  .  cetirizine (ZYRTEC) 10 MG tablet, Take 10 mg by mouth daily., Disp: , Rfl:  .  cholecalciferol (VITAMIN D) 1000 units tablet, Take 2,000 Units by mouth 2 (two) times daily., Disp: ,  Rfl:  .  clopidogrel (PLAVIX) 75 MG tablet, take 1 tablet by mouth once daily with BREAKFAST, Disp: 90 tablet, Rfl: 3 .  Coenzyme Q10 (CO Q 10) 100 MG CAPS, Take 1 tablet by mouth daily. Hold while in hospital, Disp: , Rfl:  .  Cyanocobalamin (VITAMIN B12 PO), Take by mouth., Disp: , Rfl:  .  denosumab (PROLIA) 60 MG/ML SOLN injection, Inject 60 mg into the skin every 6 (six) months. Administer in upper arm, thigh, or abdomen, Disp: , Rfl:  .  fenofibrate (TRICOR) 145 MG tablet, take 1 tablet by mouth once daily, Disp: 90 tablet, Rfl: 1 .  fluticasone (FLONASE) 50 MCG/ACT nasal spray, instill 1 spray into each nostril twice a day, Disp: 16 g, Rfl: 3 .  furosemide (LASIX) 20 MG tablet, Take 1 tablet (20 mg total) by mouth  daily., Disp: 30 tablet, Rfl: 3 .  Glucosamine-Chondroitin (OSTEO BI-FLEX REGULAR STRENGTH) 250-200 MG TABS, Take 1 tablet by mouth daily. Hold while in hospital, Disp: , Rfl:  .  losartan (COZAAR) 50 MG tablet, take 1 tablet by mouth once daily, Disp: 90 tablet, Rfl: 2 .  Multiple Vitamins-Minerals (CENTRUM SILVER PO), Take by mouth daily., Disp: , Rfl:  .  Multiple Vitamins-Minerals (PRESERVISION AREDS PO), Take 1 tablet by mouth 2 (two) times daily., Disp: , Rfl:  .  oxybutynin (DITROPAN-XL) 5 MG 24 hr tablet, take 1 tablet by mouth once daily, Disp: 90 tablet, Rfl: 1 .  pantoprazole (PROTONIX) 20 MG tablet, take 1 tablet by mouth once daily, Disp: 90 tablet, Rfl: 2 .  ranitidine (ZANTAC) 300 MG tablet, , Disp: , Rfl:  .  UNABLE TO FIND, Med Name: fiber supplement BID, Disp: , Rfl:   EXAM:  Vitals:   02/07/17 1520  BP: 140/62  Pulse: 75  Temp: 98.2 F (36.8 C)    Body mass index is 38.14 kg/m.  GENERAL: vitals reviewed and listed above, alert, oriented, appears well hydrated and in no acute distress  HEENT: atraumatic, conjunttiva clear, no obvious abnormalities on inspection of external nose and ears  NECK: no obvious masses on inspection  LUNGS: clear to auscultation bilaterally, no wheezes, rales or rhonchi, good air movement  CV: HRRR, bilat LE edema improved  SKIN: mild venous stasis dermatitis bilat LEs - no signs infections, ingrown R great toenail  MS: moves all extremities without noticeable abnormality  PSYCH: pleasant and cooperative, no obvious depression or anxiety  ASSESSMENT AND PLAN:  Discussed the following assessment and plan:  Leg edema Cellulitis of left lower extremity -cellulitis resolved, edema improved -discussed tx options -advised compression, elevation, daily lasix - double lasix dose for 3-5 days as for worsening swelling, f/u prn, labs at regular f/u  Ingrown toenail - Plan: Ambulatory referral to Podiatry -referral to podiatry per her  request  Sleep disorder -she doesn't want evaluation or tx or to change habits; advised she notify me if changes her mind  -Patient advised to return or notify a doctor immediately if symptoms worsen or persist or new concerns arise.  Patient Instructions  Follow up as scheduled.  Compression.  Elevation.  Double up on lasix (40mg  daily) for 3-5 days whenever swelling is worse.  Low sodium diet.  -We placed a referral for you as discussed to the foot doctor about the ingrown toenail. It usually takes about 1-2 weeks to process and schedule this referral. If you have not heard from [place] regarding this appointment in 2 weeks please contact our office.  -  let me know if you want to see a sleep specialist   Colin Benton R., DO

## 2017-02-07 ENCOUNTER — Encounter: Payer: Self-pay | Admitting: Family Medicine

## 2017-02-07 ENCOUNTER — Ambulatory Visit (INDEPENDENT_AMBULATORY_CARE_PROVIDER_SITE_OTHER): Payer: Medicare Other | Admitting: Family Medicine

## 2017-02-07 VITALS — BP 140/62 | HR 75 | Temp 98.2°F | Ht <= 58 in | Wt 170.1 lb

## 2017-02-07 DIAGNOSIS — L03116 Cellulitis of left lower limb: Secondary | ICD-10-CM | POA: Diagnosis not present

## 2017-02-07 DIAGNOSIS — G479 Sleep disorder, unspecified: Secondary | ICD-10-CM | POA: Diagnosis not present

## 2017-02-07 DIAGNOSIS — L600 Ingrowing nail: Secondary | ICD-10-CM | POA: Diagnosis not present

## 2017-02-07 DIAGNOSIS — R600 Localized edema: Secondary | ICD-10-CM | POA: Diagnosis not present

## 2017-02-07 NOTE — Progress Notes (Signed)
Pre visit review using our clinic review tool, if applicable. No additional management support is needed unless otherwise documented below in the visit note. 

## 2017-02-07 NOTE — Patient Instructions (Signed)
Follow up as scheduled.  Compression.  Elevation.  Double up on lasix (40mg  daily) for 3-5 days whenever swelling is worse.  Low sodium diet.  -We placed a referral for you as discussed to the foot doctor about the ingrown toenail. It usually takes about 1-2 weeks to process and schedule this referral. If you have not heard from [place] regarding this appointment in 2 weeks please contact our office.  -let me know if you want to see a sleep specialist

## 2017-02-09 ENCOUNTER — Other Ambulatory Visit: Payer: Self-pay | Admitting: Family Medicine

## 2017-02-20 ENCOUNTER — Telehealth: Payer: Self-pay | Admitting: *Deleted

## 2017-02-20 NOTE — Telephone Encounter (Signed)
Information has been submitted to pts insurance for verification of benefits. Awaiting response for coverage  

## 2017-02-25 DIAGNOSIS — R221 Localized swelling, mass and lump, neck: Secondary | ICD-10-CM | POA: Insufficient documentation

## 2017-03-04 ENCOUNTER — Encounter: Payer: Self-pay | Admitting: Podiatry

## 2017-03-04 ENCOUNTER — Ambulatory Visit (INDEPENDENT_AMBULATORY_CARE_PROVIDER_SITE_OTHER): Payer: Medicare Other | Admitting: Podiatry

## 2017-03-04 DIAGNOSIS — L600 Ingrowing nail: Secondary | ICD-10-CM

## 2017-03-04 NOTE — Progress Notes (Signed)
She presents today with a chief complaint of ingrown toenail hallux right.  Objective: Vital signs are stable she is alert and oriented 3 and reviewed her past medical history medications allergy surgery and social history. Pulses are palpable but barely so We fill time is somewhat sluggish. Neurologic sensorium is intact. Normal muscle strength bilateral. No open lesions or wounds. She has a callus to the distal medial aspect of the hallux nail fold. Mildly sharply incurvated nail.  Assessment: Ingrown nail callus hallux right.  Plan: Debrided the area today and debrided the nail she stated that this felt much better expressed to her that the only other thing we could do would be a chemical matrixectomy she would like to try that next visit if necessary.

## 2017-03-11 ENCOUNTER — Encounter: Payer: Self-pay | Admitting: Nurse Practitioner

## 2017-03-11 ENCOUNTER — Ambulatory Visit (INDEPENDENT_AMBULATORY_CARE_PROVIDER_SITE_OTHER): Payer: Medicare Other | Admitting: Nurse Practitioner

## 2017-03-11 VITALS — BP 140/80 | HR 69 | Ht <= 58 in | Wt 169.6 lb

## 2017-03-11 DIAGNOSIS — Z8673 Personal history of transient ischemic attack (TIA), and cerebral infarction without residual deficits: Secondary | ICD-10-CM | POA: Diagnosis not present

## 2017-03-11 DIAGNOSIS — E785 Hyperlipidemia, unspecified: Secondary | ICD-10-CM

## 2017-03-11 DIAGNOSIS — I10 Essential (primary) hypertension: Secondary | ICD-10-CM

## 2017-03-11 NOTE — Progress Notes (Signed)
[facility]  PATIENT: Charlene Wade DOB: 1928/05/17   REASON FOR VISIT: Follow-up for cerebral vascular accident HISTORY FROM: Patient and caregiver    HISTORY OF PRESENT ILLNESS:01/26/13 (PS): Charlene Wade is 83 year old Caucasian lady seen for followup today following hospital admission in December 2013 for TIA. She was admitted with hypertensive emergency and had transient slurred speech and right-sided weakness. CT head was negative for acute infarct and showed only white matter changes. Transthoracic echo showed normal ejection fraction. Transesophageal echocardiogram done subsequently as an outpatient showed no evidence of cardiac source of embolism or patent foramen ovale. Carotid Doppler showed no significant extracranial stenosis. Hb A1c was 5. Lipid profile was normal. Patient had previously had right temporal MCA branch infarct in August 2013 and was admitted to [facility] that time. She was placed on aspirin which was subsequently changed to Aggrenox by her primary physician. She however for some reason had stopped taking Aggrenox when she was admitted in December. Patient states she's done well on the Plavix and has not had any bleeding though she has minor bruising. She states her blood pressure is under good control and today it is 135/62 in office. Her lipid profile last checked in December was fine. She states that she's lost some weight and plans to be more physically active. She uses a walker mostly but uses a cane for short distances. She states she has recovered physically quite well from her previous stroke in August and has no new neurological complaints.   UPDATE 08/17/13 (LL): Charlene Wade comes in for stroke 6 month revisit. She states she is doing well, BP is controlled. She reports no falls. She does not know when the last time she had blood work was at her PCP. She is tolerating Plavix and baby aspirin well with no signs of  excessive bruising. She states she has just finished another round of physical therapy.  UPDATE 08/24/2014 PS: She is doing well without any TIA or stroke symptoms. She remains on plavix and aspirin and tolerating it well. She had lipids checked by primary MD 4 weeks ago which were fine but I do not have the results.She had f/u carotid ultrasound which I personally reviewed and shows no significant extracranial stenosis.She has persistent mucus in her throat and is seeing a allergist for that.She has no new complaints.MMSE today Which is 28/30 unchanged from last visit Update 03/07/2015 PS: She returns for follow-up after last visit 6 months ago. She is accompanied by a caregiver friend. She continues to have mucus or irritation in her throat and has seen Dr. Constance Holster from ENT as well as a allergist and was started on Zyrtec and a pain medication which helped somewhat. She did discontinue aspirin and remains on Plavix alone. She continues to have decrease in short-term memory and gets confused off and on she lives alone at home but her friend visits 6 days per week for 8 hours. She is not driving. She walks with a 4 pronged cane and has not had any falls. There have been no safety concerns. She wants a referral to the gastroenterologist for evaluation for gastroesophageal reflux disease UPDATE 05/15/2017CM Charlene Wade, 82 year old female returns for yearly follow-up. She has a history of stroke event which occurred in December 2013. She is currently on Plavix without further stroke or TIA symptoms. She is accompanied today by her caregiver friend that lives with her. She continues to have some memory issues. She is not driving. She  has not had any falls in the last year. She is using a quad cane there are no safety issues identified. She has left knee pain and is not a surgical candidate but does get injections every so often. She returns for reevaluation  UPDATE 05/15/2018CM  Charlene Wade, 83 year old female  returns for follow-up with her caregiver. She has a history of stroke which occurred in December 2013. She is currently on Plavix without further stroke or TIA symptoms. She has no bruising and bleeding She is also on fenofibrate for her cholesterol. Most  recent lipid panel 01/23/2018LDL 85. CBC BMP and vitamin D level all within normal limits . MMSE today 28 out of 30. She no longer drives. She has had one fall in the last year she is using a rolling walker to ambulate. She continues to have left knee pain is not a surgical candidate. Blood pressure in the office today 140/80. She returns for reevaluation  REVIEW OF SYSTEMS: Full 14 system review of systems performed and notable only for those listed, all others are neg:  Constitutional: neg Cardiovascular: neg Ear/Nose/Throat: Hearing loss Skin: neg Eyes: neg Respiratory: Occasional cough,Occasional shortness of breath  Gastroitestinal: neg  Hematology/Lymphatic: neg  Endocrine: Intolerance to cold Musculoskeletal: Left knee pain, Allergy/Immunology: neg Neurological: neg Psychiatric: neg Sleep : neg   ALLERGIES: Allergies  Allergen Reactions  . Ace Inhibitors     unknown  . Codeine     unknown  . Levaquin [Levofloxacin]     unknown  . Penicillins     unknown  . Sulfa Antibiotics     unknown  . Sulfasalazine Other (See Comments)    unknown    HOME MEDICATIONS: Outpatient Medications Prior to Visit  Medication Sig Dispense Refill  . acetaminophen (TYLENOL) 500 MG tablet Take 500 mg by mouth as needed for pain.    Marland Kitchen buPROPion (WELLBUTRIN) 75 MG tablet take 1 tablet by mouth once daily WITH EVENING MEAL 90 tablet 1  . Calcium Citrate-Vitamin D 250-200 MG-UNIT TABS Take by mouth. Take two tabs daily    . cetirizine (ZYRTEC) 10 MG tablet Take 10 mg by mouth daily.    . cholecalciferol (VITAMIN D) 1000 units tablet Take 2,000 Units by mouth 2 (two) times daily.    . clopidogrel (PLAVIX) 75 MG tablet take 1 tablet by mouth once  daily with BREAKFAST 90 tablet 3  . Coenzyme Q10 (CO Q 10) 100 MG CAPS Take 1 tablet by mouth daily. Hold while in hospital    . Cyanocobalamin (VITAMIN B12 PO) Take by mouth.    . denosumab (PROLIA) 60 MG/ML SOLN injection Inject 60 mg into the skin every 6 (six) months. Administer in upper arm, thigh, or abdomen    . fenofibrate (TRICOR) 145 MG tablet take 1 tablet by mouth once daily 90 tablet 1  . fluticasone (FLONASE) 50 MCG/ACT nasal spray instill 1 spray into each nostril twice a day 16 g 3  . furosemide (LASIX) 20 MG tablet Take 1 tablet (20 mg total) by mouth daily. 30 tablet 3  . Glucosamine-Chondroitin (OSTEO BI-FLEX REGULAR STRENGTH) 250-200 MG TABS Take 1 tablet by mouth daily. Hold while in hospital    . losartan (COZAAR) 50 MG tablet take 1 tablet by mouth once daily 90 tablet 2  . Multiple Vitamins-Minerals (CENTRUM SILVER PO) Take by mouth daily.    . Multiple Vitamins-Minerals (PRESERVISION AREDS PO) Take 1 tablet by mouth 2 (two) times daily.    Marland Kitchen oxybutynin (DITROPAN-XL) 5  MG 24 hr tablet take 1 tablet by mouth once daily 90 tablet 1  . pantoprazole (PROTONIX) 20 MG tablet take 1 tablet by mouth once daily 90 tablet 2  . UNABLE TO FIND Med Name: fiber supplement BID     No facility-administered medications prior to visit.     PAST MEDICAL HISTORY: Past Medical History:  Diagnosis Date  . Allergic rhinitis   . BCC (basal cell carcinoma of skin) 04/10/2015   sees dermatologist  . Depression 10/17/2012  . Ectopic pregnancy   . GERD (gastroesophageal reflux disease)    hx hiatal hernia, has seen GI, ENT and allergist in the past  . Hyperlipidemia   . Hypertension   . OA (osteoarthritis)    knees and back, hx DDD, s/p remote lumbar disc surgery  . OAB (overactive bladder) 04/10/2015  . Obesity   . Osteoporosis    on prolia in the past  . Stroke [facility])    sees neurologist, hx memory loss, expressive aphasia  . Venous insufficiency     PAST SURGICAL HISTORY: Past  Surgical History:  Procedure Laterality Date  . ABDOMINAL HYSTERECTOMY    . APPENDECTOMY    . BREAST REDUCTION SURGERY    . LAPAROSCOPIC SALPINGOOPHERECTOMY    . NASAL MASS EXCISION     Basal Cell Cancer Excision  . NASAL RECONSTRUCTION    . TEE WITHOUT CARDIOVERSION  10/22/2012   Procedure: TRANSESOPHAGEAL ECHOCARDIOGRAM (TEE);  Surgeon: Josue Hector, MD;  Location: [facility] ENDOSCOPY;  Service: Cardiovascular;  Laterality: N/A;    FAMILY HISTORY: Family History  Problem Relation Age of Onset  . Hypertension Father   . Hypertension Mother   . Cancer - Lung Brother     SOCIAL HISTORY: Social History   Social History  . Marital status: Widowed    Spouse name: N/A  . Number of children: 2  . Years of education: 12   Occupational History  . retired    Social History Main Topics  . Smoking status: Former Research scientist (life sciences)  . Smokeless tobacco: Never Used  . Alcohol use No  . Drug use: No  . Sexual activity: No   Other Topics Concern  . Not on file   Social History Narrative   Patient is single with 2 children.   Patient is right handed.   Patient has 12 th grade education.   Patient does not drink caffeine.     PHYSICAL EXAM  Vitals:   03/11/17 1450  BP: (!) 140/80   Pulse: 69  Weight: 169 lb 9.6 oz (76.9 kg)  Height: 4\' 8"  (1.422 m)   Body mass index is 38.02 kg/m. General: well developed, well nourished, Obese female seated, in no evident distress  Head: head normocephalic and atraumatic. Orohparynx benign  Neck: supple with no carotid  bruits  Cardiovascular: regular rate and rhythm, no murmurs  Musculoskeletal mild kyphosis  Skin mild pedal edema. No rash.   Neurologic Exam  Mental Status: Awake and fully alert. Oriented to place and time. MMSE 28 out of 30 missing  date in the address of this building  Clock drawing 4/4. Aft 9 Cranial Nerves: Pupils equal, briskly reactive to light. Extraocular movements full without nystagmus. Visual fields full to  confrontation. Hearing intact and symmetric to finer snap. Facial sensation intact. Face, tongue, palate move normally and symmetrically. Neck flexion and extension normal.  Motor: Normal bulk and tone. Normal strength in all tested extremity muscles.  Sensory: intact to touch in the upper and  lower extremities Coordination: Rapid alternating movements normal in all extremities. Finger-to-nose performed accurately bilaterally.  Gait and Station: Arises from chair with push off. Stance is wide based  Slow and careful gait. Not able to heel, toe and tandem walk . Uses a rolling walker.  Reflexes: 1+ and symmetric. Toes downgoing.  DIAGNOSTIC DATA (LABS, IMAGING, TESTING) -    Component Value Date/Time   NA 142 11/19/2016 1546   K 4.3 11/19/2016 1546   CL 109 11/19/2016 1546   CO2 27 11/19/2016 1546   GLUCOSE 91 11/19/2016 1546   BUN 20 11/19/2016 1546   CREATININE 1.05 11/19/2016 1546   CREATININE 1.04 (H) 08/16/2016 1550   CALCIUM 8.7 11/19/2016 1546   PROT 6.7 10/16/2012 2138   ALBUMIN 3.6 10/16/2012 2138   AST 24 10/16/2012 2138   ALT 6 10/16/2012 2138   ALKPHOS 36 (L) 10/16/2012 2138   BILITOT 0.3 10/16/2012 2138   GFRNONAA 46 (L) 06/02/2015 1503   GFRAA 53 (L) 06/02/2015 1503   Lab Results  Component Value Date   CHOL 184 11/19/2016   HDL 79.20 11/19/2016   LDLCALC 85 11/19/2016   TRIG 98.0 11/19/2016   CHOLHDL 2 11/19/2016   01/23/2018LDL 85. CBC BMP and vitamin D level all within normal limits .     ASSESSMENT AND PLAN 83 year old lady with left hemispheric TIA in December 2013 and right MCA branch infarcts likely of embolic etiology without definite identified source of embolism. Vascular risk factors of hypertension and hyperlipidemia. Mild age-appropriate cognitive impairment. The patient is a current patient of Dr. Leonie Man  who is out of the office today . This note is sent to the work in doctor.     PLAN: Stressed the importance of management of risk factors to  prevent further stroke Continue Plavix for secondary stroke prevention Maintain strict control of hypertension with blood pressure goal below 130/90, today's reading 140/80 continue antihypertensive medications Cholesterol with LDL cholesterol less than 70, followed by primary care, continue fenofibrate Exercise by walking, use walker at all times for safe ambulation  eat healthy diet with whole grains,  fresh fruits and vegetables   memory score is stable, age-appropriate Will discharge from stroke clinic I spent 25 minutes in total face to face time with the patient more than 50% of which was spent counseling and coordination of care, reviewing test results reviewing medications and discussing and reviewing the diagnosis of stroke and stroke prevention and management of risk factors. Her memory score is stable and  age-appropriate  Dennie Bible, [facility], [place], [facility], APRN  [facility] [address], [place], [place] [address] [phone]

## 2017-03-11 NOTE — Patient Instructions (Addendum)
Stressed the importance of management of risk factors to prevent further stroke Continue Plavix for secondary stroke prevention Maintain strict control of hypertension with blood pressure goal below 130/90, today's reading 140/80 continue antihypertensive medications Cholesterol with LDL cholesterol less than 70, followed by primary care, continue fenofibrate Exercise by walking, use walker at all times for safe ambulation  eat healthy diet with whole grains,  fresh fruits and vegetables   memory score is stable, age-appropriate Will discharge from stroke clinic Stroke Prevention Some medical conditions and behaviors are associated with an increased chance of having a stroke. You may prevent a stroke by making healthy choices and managing medical conditions. How can I reduce my risk of having a stroke?  Stay physically active. Get at least 30 minutes of activity on most or all days.  Do not smoke. It may also be helpful to avoid exposure to secondhand smoke.  Limit alcohol use. Moderate alcohol use is considered to be:  No more than 2 drinks per day for men.  No more than 1 drink per day for nonpregnant women.  Eat healthy foods. This involves:  Eating 5 or more servings of fruits and vegetables a day.  Making dietary changes that address high blood pressure (hypertension), high cholesterol, diabetes, or obesity.  Manage your cholesterol levels.  Making food choices that are high in fiber and low in saturated fat, trans fat, and cholesterol may control cholesterol levels.  Take any prescribed medicines to control cholesterol as directed by your health care provider.  Manage your diabetes.  Controlling your carbohydrate and sugar intake is recommended to manage diabetes.  Take any prescribed medicines to control diabetes as directed by your health care provider.  Control your hypertension.  Making food choices that are low in salt (sodium), saturated fat, trans fat, and  cholesterol is recommended to manage hypertension.  Ask your health care provider if you need treatment to lower your blood pressure. Take any prescribed medicines to control hypertension as directed by your health care provider.  If you are 54-34 years of age, have your blood pressure checked every 3-5 years. If you are 87 years of age or older, have your blood pressure checked every year.  Maintain a healthy weight.  Reducing calorie intake and making food choices that are low in sodium, saturated fat, trans fat, and cholesterol are recommended to manage weight.  Stop drug abuse.  Avoid taking birth control pills.  Talk to your health care provider about the risks of taking birth control pills if you are over 52 years old, smoke, get migraines, or have ever had a blood clot.  Get evaluated for sleep disorders (sleep apnea).  Talk to your health care provider about getting a sleep evaluation if you snore a lot or have excessive sleepiness.  Take medicines only as directed by your health care provider.  For some people, aspirin or blood thinners (anticoagulants) are helpful in reducing the risk of forming abnormal blood clots that can lead to stroke. If you have the irregular heart rhythm of atrial fibrillation, you should be on a blood thinner unless there is a good reason you cannot take them.  Understand all your medicine instructions.  Make sure that other conditions (such as anemia or atherosclerosis) are addressed. Get help right away if:  You have sudden weakness or numbness of the face, arm, or leg, especially on one side of the body.  Your face or eyelid droops to one side.  You have sudden confusion.  You have trouble speaking (aphasia) or understanding.  You have sudden trouble seeing in one or both eyes.  You have sudden trouble walking.  You have dizziness.  You have a loss of balance or coordination.  You have a sudden, severe headache with no known  cause.  You have new chest pain or an irregular heartbeat. Any of these symptoms may represent a serious problem that is an emergency. Do not wait to see if the symptoms will go away. Get medical help at once. Call your local emergency services (911 in [place] Do not drive yourself to the hospital. This information is not intended to replace advice given to you by your health care provider. Make sure you discuss any questions you have with your health care provider. Document Released: 11/21/2004 Document Revised: 03/21/2016 Document Reviewed: 04/16/2013 Elsevier Interactive Patient Education  2017 [employer].

## 2017-03-11 NOTE — Progress Notes (Signed)
I reviewed above note and agree with the assessment and plan.  Rosalin Hawking, MD PhD Stroke Neurology 03/11/2017 7:12 PM

## 2017-03-21 ENCOUNTER — Encounter: Payer: Self-pay | Admitting: Family Medicine

## 2017-03-21 ENCOUNTER — Ambulatory Visit (INDEPENDENT_AMBULATORY_CARE_PROVIDER_SITE_OTHER): Payer: Medicare Other | Admitting: Family Medicine

## 2017-03-21 VITALS — BP 132/50 | HR 75 | Temp 97.7°F | Ht <= 58 in | Wt 170.6 lb

## 2017-03-21 DIAGNOSIS — Z8673 Personal history of transient ischemic attack (TIA), and cerebral infarction without residual deficits: Secondary | ICD-10-CM

## 2017-03-21 DIAGNOSIS — N183 Chronic kidney disease, stage 3 unspecified: Secondary | ICD-10-CM

## 2017-03-21 DIAGNOSIS — E785 Hyperlipidemia, unspecified: Secondary | ICD-10-CM | POA: Diagnosis not present

## 2017-03-21 DIAGNOSIS — I10 Essential (primary) hypertension: Secondary | ICD-10-CM

## 2017-03-21 DIAGNOSIS — R600 Localized edema: Secondary | ICD-10-CM

## 2017-03-21 LAB — CBC
HCT: 34.6 % — ABNORMAL LOW (ref 35.0–45.0)
Hemoglobin: 11.3 g/dL — ABNORMAL LOW (ref 11.7–15.5)
MCH: 29.4 pg (ref 27.0–33.0)
MCHC: 32.7 g/dL (ref 32.0–36.0)
MCV: 89.9 fL (ref 80.0–100.0)
MPV: 9.5 fL (ref 7.5–12.5)
Platelets: 338 10*3/uL (ref 140–400)
RBC: 3.85 MIL/uL (ref 3.80–5.10)
RDW: 14.2 % (ref 11.0–15.0)
WBC: 7.9 10*3/uL (ref 3.8–10.8)

## 2017-03-21 NOTE — Patient Instructions (Signed)
BEFORE YOU LEAVE: -follow up: 3-4 months -labs  Stop the multivitamin  Take Vit D3 1000 IU daily (sam's club or cosco version is good)  Wear the compression socks daily  Advise regular aerobic exercise (at least 150 minutes per week of sweaty exercise) and a healthy diet. Try to eat at least 5-9 servings of vegetables and fruits per day (not corn, potatoes or bananas.) Avoid sweets, red meat, pork, butter, fried foods, fast food, processed food, excessive dairy, eggs and coconut. Replace bad fats with good fats - fish, nuts and seeds, canola oil, olive oil.

## 2017-03-21 NOTE — Progress Notes (Signed)
HPI:  Charlene Wade is a pleasant 83 y.o. here for follow up. Chronic medical problems summarized below were reviewed for changes and stability and were updated as needed below. These issues and their treatment remain stable for the most part. Has questions about taking multivitamins and eye supplement prescribed by her eye doctor. ENT saw her from the neck mass - daughter reports they are watching it and she will have 3 month follow up with ENT. Thankfully doing better with compression socks and edema has been better. Denies CP, SOB, DOE, treatment intolerance or new symptoms.  HTN/Hx Stroke/HLD/Chronic LE edema: -sees cardiologist, Dr. Gwenlyn Found -meds: plavix, lasix, losartan, tricor -wearing compression socks more  GERD: -meds pantoprazole  Seasonal allergies: -meds: zyrtec, flonase  Osteoporosis/vit D def: -sees endo for management -on prolia, vit D  OAB: -meds: ditropan  Depression: -meds: welbutrin    ROS: See pertinent positives and negatives per HPI.  Past Medical History:  Diagnosis Date  . Allergic rhinitis   . BCC (basal cell carcinoma of skin) 04/10/2015   sees dermatologist  . Depression 10/17/2012  . Ectopic pregnancy   . GERD (gastroesophageal reflux disease)    hx hiatal hernia, has seen GI, ENT and allergist in the past  . Hyperlipidemia   . Hypertension   . OA (osteoarthritis)    knees and back, hx DDD, s/p remote lumbar disc surgery  . OAB (overactive bladder) 04/10/2015  . Obesity   . Osteoporosis    on prolia in the past  . Stroke [facility])    sees neurologist, hx memory loss, expressive aphasia  . Venous insufficiency     Past Surgical History:  Procedure Laterality Date  . ABDOMINAL HYSTERECTOMY    . APPENDECTOMY    . BREAST REDUCTION SURGERY    . LAPAROSCOPIC SALPINGOOPHERECTOMY    . NASAL MASS EXCISION     Basal Cell Cancer Excision  . NASAL RECONSTRUCTION    . TEE WITHOUT CARDIOVERSION  10/22/2012   Procedure: TRANSESOPHAGEAL  ECHOCARDIOGRAM (TEE);  Surgeon: Josue Hector, MD;  Location: [facility] ENDOSCOPY;  Service: Cardiovascular;  Laterality: N/A;    Family History  Problem Relation Age of Onset  . Hypertension Father   . Hypertension Mother   . Cancer - Lung Brother     Social History   Social History  . Marital status: Widowed    Spouse name: N/A  . Number of children: 2  . Years of education: 12   Occupational History  . retired    Social History Main Topics  . Smoking status: Former Research scientist (life sciences)  . Smokeless tobacco: Never Used  . Alcohol use No  . Drug use: No  . Sexual activity: No   Other Topics Concern  . None   Social History Narrative   Patient is single with 2 children.   Patient is right handed.   Patient has 12 th grade education.   Patient does not drink caffeine.     Current Outpatient Prescriptions:  .  acetaminophen (TYLENOL) 500 MG tablet, Take 500 mg by mouth as needed for pain., Disp: , Rfl:  .  buPROPion (WELLBUTRIN) 75 MG tablet, take 1 tablet by mouth once daily WITH EVENING MEAL, Disp: 90 tablet, Rfl: 1 .  Calcium Citrate-Vitamin D 250-200 MG-UNIT TABS, Take by mouth. Take two tabs daily, Disp: , Rfl:  .  cetirizine (ZYRTEC) 10 MG tablet, Take 10 mg by mouth daily., Disp: , Rfl:  .  cholecalciferol (VITAMIN D) 1000 units tablet, Take 2,000  Units by mouth 2 (two) times daily., Disp: , Rfl:  .  clopidogrel (PLAVIX) 75 MG tablet, take 1 tablet by mouth once daily with BREAKFAST, Disp: 90 tablet, Rfl: 3 .  Coenzyme Q10 (CO Q 10) 100 MG CAPS, Take 1 tablet by mouth daily. Hold while in hospital, Disp: , Rfl:  .  Cyanocobalamin (VITAMIN B12 PO), Take by mouth., Disp: , Rfl:  .  denosumab (PROLIA) 60 MG/ML SOLN injection, Inject 60 mg into the skin every 6 (six) months. Administer in upper arm, thigh, or abdomen, Disp: , Rfl:  .  fenofibrate (TRICOR) 145 MG tablet, take 1 tablet by mouth once daily, Disp: 90 tablet, Rfl: 1 .  fluticasone (FLONASE) 50 MCG/ACT nasal spray, instill 1  spray into each nostril twice a day, Disp: 16 g, Rfl: 3 .  furosemide (LASIX) 20 MG tablet, Take 1 tablet (20 mg total) by mouth daily., Disp: 30 tablet, Rfl: 3 .  Glucosamine-Chondroitin (OSTEO BI-FLEX REGULAR STRENGTH) 250-200 MG TABS, Take 1 tablet by mouth daily. Hold while in hospital, Disp: , Rfl:  .  losartan (COZAAR) 50 MG tablet, take 1 tablet by mouth once daily, Disp: 90 tablet, Rfl: 2 .  Multiple Vitamins-Minerals (CENTRUM SILVER PO), Take by mouth daily., Disp: , Rfl:  .  Multiple Vitamins-Minerals (PRESERVISION AREDS PO), Take 1 tablet by mouth 2 (two) times daily., Disp: , Rfl:  .  oxybutynin (DITROPAN-XL) 5 MG 24 hr tablet, take 1 tablet by mouth once daily, Disp: 90 tablet, Rfl: 1 .  pantoprazole (PROTONIX) 20 MG tablet, take 1 tablet by mouth once daily, Disp: 90 tablet, Rfl: 2 .  UNABLE TO FIND, Med Name: fiber supplement BID, Disp: , Rfl:   EXAM:  Vitals:   03/21/17 1613  BP: (!) 132/50  Pulse: 75  Temp: 97.7 F (36.5 C)    Body mass index is 38.25 kg/m.  GENERAL: vitals reviewed and listed above, alert, oriented, appears well hydrated and in no acute distress  HEENT: atraumatic, conjunttiva clear, no obvious abnormalities on inspection of external nose and ears  NECK: no obvious masses on inspection  LUNGS: clear to auscultation bilaterally, no wheezes, rales or rhonchi, good air movement  CV: HRRR, 1 + ankle peripheral edema  MS: moves all extremities without noticeable abnormality  PSYCH: pleasant and cooperative, no obvious depression or anxiety  ASSESSMENT AND PLAN:  Discussed the following assessment and plan:  Essential hypertension - Plan: Basic metabolic panel, CBC (no diff), CANCELED: Basic metabolic panel, CANCELED: CBC  Hyperlipidemia, unspecified hyperlipidemia type  H/O: CVA (cerebrovascular accident)  CKD (chronic kidney disease) stage 3, GFR 30-59 ml/min  Leg edema  -advised against MV in addition to eye supplement (advised  following eye doctor recs regarding eye supplements)and discussed quality concerns with supplements -did advise regular Vit D3 and suggested brands recommended by consumer labs -lifestyle recs -glad she is using compression socks and legs are doing better -BP labs today -follow up 3-4 months -Patient advised to return or notify a doctor immediately if symptoms worsen or persist or new concerns arise.  Patient Instructions  BEFORE YOU LEAVE: -follow up: 3-4 months -labs  Stop the multivitamin  Take Vit D3 1000 IU daily ([employer] club or cosco version is good)  Wear the compression socks daily  Advise regular aerobic exercise (at least 150 minutes per week of sweaty exercise) and a healthy diet. Try to eat at least 5-9 servings of vegetables and fruits per day (not corn, potatoes or bananas.) Avoid sweets,  red meat, pork, butter, fried foods, fast food, processed food, excessive dairy, eggs and coconut. Replace bad fats with good fats - fish, nuts and seeds, canola oil, olive oil.      Colin Benton R., DO

## 2017-03-22 LAB — BASIC METABOLIC PANEL
BUN: 27 mg/dL — AB (ref 7–25)
CALCIUM: 9 mg/dL (ref 8.6–10.4)
CHLORIDE: 108 mmol/L (ref 98–110)
CO2: 23 mmol/L (ref 20–31)
CREATININE: 1.22 mg/dL — AB (ref 0.60–0.88)
GLUCOSE: 99 mg/dL (ref 65–99)
Potassium: 4.8 mmol/L (ref 3.5–5.3)
Sodium: 144 mmol/L (ref 135–146)

## 2017-04-02 DIAGNOSIS — M1712 Unilateral primary osteoarthritis, left knee: Secondary | ICD-10-CM | POA: Diagnosis not present

## 2017-04-03 ENCOUNTER — Telehealth: Payer: Self-pay

## 2017-04-03 NOTE — Telephone Encounter (Signed)
Verification of benefits have been processed and an approval has been received for pts prolia injection. Pts estimated cost are appx $0. This is only an estimate and cannot be confirmed until benefits are paid. Please advise pt and schedule if needed. If scheduled, once the injection is received, pls contact me back with the date it was received so that I am able to update prolia folder. thanks  Injection can be received after 04/09/17

## 2017-04-03 NOTE — Telephone Encounter (Signed)
Called patient and notified of Prolia information and when it could be scheduled.

## 2017-04-08 ENCOUNTER — Other Ambulatory Visit: Payer: Self-pay | Admitting: Family Medicine

## 2017-04-16 ENCOUNTER — Ambulatory Visit (INDEPENDENT_AMBULATORY_CARE_PROVIDER_SITE_OTHER): Payer: Medicare Other

## 2017-04-16 DIAGNOSIS — M810 Age-related osteoporosis without current pathological fracture: Secondary | ICD-10-CM | POA: Diagnosis not present

## 2017-04-16 MED ORDER — DENOSUMAB 60 MG/ML ~~LOC~~ SOLN
60.0000 mg | Freq: Once | SUBCUTANEOUS | Status: AC
  Administered 2017-04-16: 60 mg via SUBCUTANEOUS

## 2017-05-09 DIAGNOSIS — M7062 Trochanteric bursitis, left hip: Secondary | ICD-10-CM | POA: Diagnosis not present

## 2017-05-09 DIAGNOSIS — M1712 Unilateral primary osteoarthritis, left knee: Secondary | ICD-10-CM | POA: Diagnosis not present

## 2017-05-14 DIAGNOSIS — H353213 Exudative age-related macular degeneration, right eye, with inactive scar: Secondary | ICD-10-CM | POA: Diagnosis not present

## 2017-05-14 DIAGNOSIS — H04123 Dry eye syndrome of bilateral lacrimal glands: Secondary | ICD-10-CM | POA: Diagnosis not present

## 2017-05-14 DIAGNOSIS — H01001 Unspecified blepharitis right upper eyelid: Secondary | ICD-10-CM | POA: Diagnosis not present

## 2017-05-14 DIAGNOSIS — H353123 Nonexudative age-related macular degeneration, left eye, advanced atrophic without subfoveal involvement: Secondary | ICD-10-CM | POA: Diagnosis not present

## 2017-05-14 DIAGNOSIS — H5213 Myopia, bilateral: Secondary | ICD-10-CM | POA: Diagnosis not present

## 2017-05-14 DIAGNOSIS — H524 Presbyopia: Secondary | ICD-10-CM | POA: Diagnosis not present

## 2017-05-14 DIAGNOSIS — H52223 Regular astigmatism, bilateral: Secondary | ICD-10-CM | POA: Diagnosis not present

## 2017-05-17 ENCOUNTER — Other Ambulatory Visit: Payer: Self-pay | Admitting: Family Medicine

## 2017-05-19 ENCOUNTER — Encounter (INDEPENDENT_AMBULATORY_CARE_PROVIDER_SITE_OTHER): Payer: Medicare Other | Admitting: Ophthalmology

## 2017-05-19 DIAGNOSIS — H43813 Vitreous degeneration, bilateral: Secondary | ICD-10-CM | POA: Diagnosis not present

## 2017-05-19 DIAGNOSIS — I10 Essential (primary) hypertension: Secondary | ICD-10-CM

## 2017-05-19 DIAGNOSIS — H35033 Hypertensive retinopathy, bilateral: Secondary | ICD-10-CM | POA: Diagnosis not present

## 2017-05-19 DIAGNOSIS — H353114 Nonexudative age-related macular degeneration, right eye, advanced atrophic with subfoveal involvement: Secondary | ICD-10-CM | POA: Diagnosis not present

## 2017-05-19 DIAGNOSIS — H353122 Nonexudative age-related macular degeneration, left eye, intermediate dry stage: Secondary | ICD-10-CM | POA: Diagnosis not present

## 2017-05-21 ENCOUNTER — Other Ambulatory Visit: Payer: Self-pay | Admitting: Family Medicine

## 2017-06-09 ENCOUNTER — Encounter: Payer: Self-pay | Admitting: Internal Medicine

## 2017-06-09 ENCOUNTER — Ambulatory Visit (INDEPENDENT_AMBULATORY_CARE_PROVIDER_SITE_OTHER): Payer: Medicare Other | Admitting: Internal Medicine

## 2017-06-09 VITALS — BP 132/84 | HR 95 | Ht <= 58 in | Wt 169.0 lb

## 2017-06-09 DIAGNOSIS — M810 Age-related osteoporosis without current pathological fracture: Secondary | ICD-10-CM | POA: Diagnosis not present

## 2017-06-09 DIAGNOSIS — E559 Vitamin D deficiency, unspecified: Secondary | ICD-10-CM

## 2017-06-09 LAB — VITAMIN D 25 HYDROXY (VIT D DEFICIENCY, FRACTURES): VITD: 31.94 ng/mL (ref 30.00–100.00)

## 2017-06-09 NOTE — Patient Instructions (Signed)
Please continue vitamin D at the same dose for now.  Please stop at the lab.  We will call you with the DEXA scan schedule.  Please continue Prolia.  Please return in 1 year.

## 2017-06-09 NOTE — Progress Notes (Addendum)
Patient ID: Charlene Wade, female   DOB: Mar 27, 1928, 83 y.o.   MRN: [id number]   HPI  Charlene Wade is a 83 y.o.-year-old female,  Osteoporosis and vitamin D deficiency.Last visit 1 year ago. She is here with her caregiver who offers some of the history, especially related to her past medical history.  Since last visit she developed L hip bursitis. She got several steroid inj's. She also had steroid  inj in knee.  She had 2 recent root canals >> no jaw pain, healed well.   Reviewed history: Pt was dx with OP in 2007.  She fell and had a R humerus fx (comminuted) >> in 11/2011.  Reviewed DEXA scan reports - improved at last check, however, this was done on a different machine:  06/28/2015 ([place]) Lumbar spine (L1-L4) Femoral neck (FN)  T-score  +0.4  RFN: -1.3 LFN: -1.3    08/01/2011 - [facility]):  L1-L3 (L4) T score: 0.0 RFN T score: -1.6 LFN T score: -1.7 FRAX score: MOF 12.6%, hip fracture risk 3.2% 05/28/2006 [facility]):  L1-L4 T score: -1.0 LFN T score: -1.3 1/3 distal forearm T score: -2.7 VFA was performed and was read as abnormal, however no details given in the report and the image quality is too poor for me to analyze.  She has been on the following OP treatments:  - Fosamax - for "many years"  - developed a cyst 5-6 years ago in the bone of the wisdom tooth >> extraction >> infection >> was told she had a jaw fracture - Prolia - so far, she took it consistently since 2013 ([place]).  Received records from [place]: Prolia injections documented- no co-pay required:  She continues on Prolia. She had 6 doses: - 09/14/2014  - 03/03/2015  - 09/19/2015 - 03/19/2016  - 10/08/2016 - 04/16/2017  + h/o vitamin D deficiency: Lab Results  Component Value Date   VD25OH 48.19 06/03/2016   VD25OH 34.96 10/02/2015   VD25OH 15.79 (L) 06/02/2015   She takes vitamin D 4000  units daily  (she is not sure if she did not go back to 2000 IU since last visit) (prev. On  [place]). She also eats dairy (yoghurt every day) but no green, leafy, vegetables.   No weightbearing exercises.  No stairs at home. 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   No h/o hyper/hypocalcemia. No h/o hyperparathyroidism. No history of kidney stones. Lab Results  Component Value Date   PTH 8.3 (L) 10/13/2012   CALCIUM 9.0 03/21/2017   CALCIUM 8.7 11/19/2016   CALCIUM 9.2 08/16/2016   CALCIUM 9.6 03/18/2016   CALCIUM 9.7 11/21/2015   CALCIUM 9.1 06/02/2015   CALCIUM 8.7 04/25/2015   CALCIUM 9.7 10/16/2012   CALCIUM 10.0 10/14/2012   CALCIUM 10.3 10/13/2012   No h/o thyrotoxicosis. Last TSH normal: Lab Results  Component Value Date   TSH 1.48 06/02/2015   + CKD. Last BUN/Cr - worse - she believes due to diuretics: Lab Results  Component Value Date   BUN 27 (H) 03/21/2017   CREATININE 1.22 (H) 03/21/2017  GFR 43.66.  Pt does have a FH of osteoporosis. Mother with osteoporosis - arm fx.   She also has a history of spinal stenosis - s/p surgery for ruptured disk. She had inj in spine.   ROS: Constitutional: no weight gain/no weight loss, + fatigue, no subjective hyperthermia, + subjective hypothermia, +  nocturia  Eyes: no blurry vision, no xerophthalmia ENT: no sore throat, no nodules palpated in throat, no dysphagia, no odynophagia, no hoarseness Cardiovascular: no CP/+ SOB/no palpitations/+ leg swelling Respiratory: + cough/+ SOB/+ wheezing Gastrointestinal: no N/no V/no D/no C/no acid reflux Musculoskeletal: + muscle aches/+ joint aches Skin: no rashes, no hair loss Neurological: no tremors/no numbness/no tingling/no dizziness  I reviewed pt's medications, allergies, PMH, social hx, family hx, and changes were documented in the history of present illness. Otherwise, unchanged from my initial visit note.  Past Medical History:  Diagnosis Date  . Allergic rhinitis   . BCC  (basal cell carcinoma of skin) 04/10/2015   sees dermatologist  . Depression 10/17/2012  . Ectopic pregnancy   . GERD (gastroesophageal reflux disease)    hx hiatal hernia, has seen GI, ENT and allergist in the past  . Hyperlipidemia   . Hypertension   . OA (osteoarthritis)    knees and back, hx DDD, s/p remote lumbar disc surgery  . OAB (overactive bladder) 04/10/2015  . Obesity   . Osteoporosis    on prolia in the past  . Stroke [facility])    sees neurologist, hx memory loss, expressive aphasia  . Venous insufficiency    Past Surgical History:  Procedure Laterality Date  . ABDOMINAL HYSTERECTOMY    . APPENDECTOMY    . BREAST REDUCTION SURGERY    . LAPAROSCOPIC SALPINGOOPHERECTOMY    . NASAL MASS EXCISION     Basal Cell Cancer Excision  . NASAL RECONSTRUCTION    . TEE WITHOUT CARDIOVERSION  10/22/2012   Procedure: TRANSESOPHAGEAL ECHOCARDIOGRAM (TEE);  Surgeon: Josue Hector, MD;  Location: [facility] ENDOSCOPY;  Service: Cardiovascular;  Laterality: N/A;   Social History   Social History  . Marital status: Widowed    Spouse name: N/A  . Number of children: 2  . Years of education: 12   Occupational History  . retired    Social History Main Topics  . Smoking status: Former Research scientist (life sciences)  . Smokeless tobacco: Never Used  . Alcohol use No  . Drug use: No  . Sexual activity: No   Other Topics Concern  . Not on file   Social History Narrative   Patient is single with 2 children.   Patient is right handed.   Patient has 12 th grade education.   Patient does not drink caffeine.   Current Outpatient Prescriptions on File Prior to Visit  Medication Sig Dispense Refill  . acetaminophen (TYLENOL) 500 MG tablet Take 500 mg by mouth as needed for pain.    Marland Kitchen buPROPion (WELLBUTRIN) 75 MG tablet take 1 tablet by mouth once daily WITH EVENING MEAL 90 tablet 1  . Calcium Citrate-Vitamin D 250-200 MG-UNIT TABS Take by mouth. Take two tabs daily    . cetirizine (ZYRTEC) 10 MG tablet Take 10 mg  by mouth daily.    . cholecalciferol (VITAMIN D) 1000 units tablet Take 2,000 Units by mouth 2 (two) times daily.    . clopidogrel (PLAVIX) 75 MG tablet take 1 tablet by mouth once daily with BREAKFAST 90 tablet 3  . Coenzyme Q10 (CO Q 10) 100 MG CAPS Take 1 tablet by mouth daily. Hold while in hospital    . Cyanocobalamin (VITAMIN B12 PO) Take by mouth.    . denosumab (PROLIA) 60 MG/ML SOLN injection Inject 60 mg into the skin every 6 (six) months. Administer in upper arm, thigh, or abdomen    . fenofibrate (TRICOR) 145 MG tablet take 1  tablet by mouth once daily 90 tablet 1  . fluticasone (FLONASE) 50 MCG/ACT nasal spray instill 1 spray into each nostril twice a day 16 g 3  . furosemide (LASIX) 20 MG tablet Take 1 tablet (20 mg total) by mouth daily. 30 tablet 3  . Glucosamine-Chondroitin (OSTEO BI-FLEX REGULAR STRENGTH) 250-200 MG TABS Take 1 tablet by mouth daily. Hold while in hospital    . losartan (COZAAR) 50 MG tablet take 1 tablet by mouth once daily 90 tablet 1  . Multiple Vitamins-Minerals (CENTRUM SILVER PO) Take by mouth daily.    . Multiple Vitamins-Minerals (PRESERVISION AREDS PO) Take 1 tablet by mouth 2 (two) times daily.    Marland Kitchen oxybutynin (DITROPAN-XL) 5 MG 24 hr tablet take 1 tablet by mouth once daily 90 tablet 1  . pantoprazole (PROTONIX) 20 MG tablet take 1 tablet by mouth once daily 90 tablet 1  . UNABLE TO FIND Med Name: fiber supplement BID     No current facility-administered medications on file prior to visit.    Allergies  Allergen Reactions  . Ace Inhibitors     unknown  . Codeine     unknown  . Levaquin [Levofloxacin]     unknown  . Penicillins     unknown  . Sulfa Antibiotics     unknown  . Sulfasalazine Other (See Comments)    unknown   Family History  Problem Relation Age of Onset  . Hypertension Father   . Hypertension Mother   . Cancer - Lung Brother    PE: BP 132/84   Pulse 95   Ht 4\' 8"  (1.422 m)   Wt 169 lb (76.7 kg)   SpO2 95%   BMI  37.89 kg/m  Wt Readings from Last 3 Encounters:  06/09/17 169 lb (76.7 kg)  03/21/17 170 lb 9.6 oz (77.4 kg)  03/11/17 169 lb 9.6 oz (76.9 kg)   Constitutional: overweight, in NAD Eyes: PERRLA, EOMI, no exophthalmos ENT: moist mucous membranes, no thyromegaly, no cervical lymphadenopathy Cardiovascular: tachycardia, RR, No MRG Respiratory: CTA B Gastrointestinal: abdomen soft, NT, ND, BS+ Musculoskeletal: + kyphosis, strength intact in all 4 Skin: moist, warm, no rashes Neurological: no tremor with outstretched hands, DTR normal in all 4  Assessment: 1. Osteoporosis  2. Vit D deficiency  Plan: 1. Osteoporosis - Likely postmenopausal +/- 2/2 steroids - she had a humeral fracture in 2013, no fractures since - We reviewed together her previous DEXA scan reports - she is currently on Prolia for 3 years. We cannot use bisphosphonates due to decreased GFR. I plan to continue Prolia for at least 6 years.  -  no side effects from Prolia  - she had recent dental work  without any problems - will arrange for a new Prolia infusion in December - will check a new DEXA scan after 06/27/2017 >> ordered  2. Vit D def - on 4000 IU vit D daily, she believes -  will check at home  - check level of vit D today  Component     Latest Ref Rng & Units 06/09/2017  VITD     30.00 - 100.00 ng/mL 31.94  If she is taking 2000 units daily >> increase to 4000.  Addendum: 07/09/2017:  Lumbar spine L2-L4 (L1) Femoral neck (FN)  T-score +0.9 RFN: -1.7 LFN: -1.2  Change in BMD from previous DXA test (%) +7.8%* -2.1%  (*) statistically significant  T score is better at the spine level, stable at L hip and decreased  slightly at R hip.  Philemon Kingdom, MD PhD [facility] Endocrinology

## 2017-06-10 ENCOUNTER — Telehealth: Payer: Self-pay | Admitting: Internal Medicine

## 2017-06-10 ENCOUNTER — Telehealth: Payer: Self-pay

## 2017-06-10 NOTE — Telephone Encounter (Signed)
Called and notified patient of appointment date to have a dexa scan. September 6th, @ 9:30am. Left call back number to the office if they needed [facility] number to reschedule.

## 2017-06-10 NOTE — Telephone Encounter (Signed)
-----   Message from Philemon Kingdom, MD sent at 06/10/2017 11:32 AM EDT ----- Charlene Wade, can you please call pt: Vit D low-normal. If she is taking 2000 units daily >> increase to 4000. If on 4000, she can remain on this for now.

## 2017-06-10 NOTE — Telephone Encounter (Signed)
Patient's caregiver stated that 07/03/17 appointment at 9:30 will not work. Please call to reschedule. OK to leave message. Caregiver will be out for next 4 days so would like a call back today.

## 2017-06-10 NOTE — Telephone Encounter (Signed)
I contacted them already, and had them contact [facility].

## 2017-06-10 NOTE — Telephone Encounter (Signed)
Routing to you °

## 2017-06-10 NOTE — Telephone Encounter (Signed)
Called and advised patients caregiver of lab information, also the change in medication if needed. They also advised that the bone density appointment did not work for them so I gave the number to [place] office to reschedule.

## 2017-06-15 ENCOUNTER — Other Ambulatory Visit: Payer: Self-pay | Admitting: Family Medicine

## 2017-06-16 DIAGNOSIS — R221 Localized swelling, mass and lump, neck: Secondary | ICD-10-CM | POA: Diagnosis not present

## 2017-07-03 ENCOUNTER — Other Ambulatory Visit: Payer: Medicare Other

## 2017-07-04 DIAGNOSIS — M1712 Unilateral primary osteoarthritis, left knee: Secondary | ICD-10-CM | POA: Diagnosis not present

## 2017-07-07 ENCOUNTER — Ambulatory Visit: Payer: Medicare Other | Admitting: Family Medicine

## 2017-07-08 ENCOUNTER — Encounter: Payer: Self-pay | Admitting: Family Medicine

## 2017-07-08 ENCOUNTER — Ambulatory Visit (INDEPENDENT_AMBULATORY_CARE_PROVIDER_SITE_OTHER): Payer: Medicare Other | Admitting: Family Medicine

## 2017-07-08 VITALS — BP 138/70 | HR 73 | Temp 97.7°F | Ht <= 58 in | Wt 170.1 lb

## 2017-07-08 DIAGNOSIS — Z23 Encounter for immunization: Secondary | ICD-10-CM | POA: Diagnosis not present

## 2017-07-08 DIAGNOSIS — F39 Unspecified mood [affective] disorder: Secondary | ICD-10-CM | POA: Diagnosis not present

## 2017-07-08 DIAGNOSIS — N183 Chronic kidney disease, stage 3 unspecified: Secondary | ICD-10-CM

## 2017-07-08 DIAGNOSIS — Z1389 Encounter for screening for other disorder: Secondary | ICD-10-CM

## 2017-07-08 DIAGNOSIS — Z1331 Encounter for screening for depression: Secondary | ICD-10-CM

## 2017-07-08 DIAGNOSIS — I872 Venous insufficiency (chronic) (peripheral): Secondary | ICD-10-CM

## 2017-07-08 DIAGNOSIS — L03116 Cellulitis of left lower limb: Secondary | ICD-10-CM | POA: Diagnosis not present

## 2017-07-08 DIAGNOSIS — E6609 Other obesity due to excess calories: Secondary | ICD-10-CM

## 2017-07-08 DIAGNOSIS — Z6838 Body mass index (BMI) 38.0-38.9, adult: Secondary | ICD-10-CM

## 2017-07-08 DIAGNOSIS — IMO0001 Reserved for inherently not codable concepts without codable children: Secondary | ICD-10-CM

## 2017-07-08 DIAGNOSIS — I10 Essential (primary) hypertension: Secondary | ICD-10-CM | POA: Diagnosis not present

## 2017-07-08 DIAGNOSIS — E785 Hyperlipidemia, unspecified: Secondary | ICD-10-CM

## 2017-07-08 MED ORDER — DULOXETINE HCL 20 MG PO CPEP: 20.0000 mg | ORAL_CAPSULE | Freq: Every day | ORAL | 3 refills | Status: DC

## 2017-07-08 MED ORDER — DOXYCYCLINE HYCLATE 100 MG PO TABS: 100.0000 mg | ORAL_TABLET | Freq: Two times a day (BID) | ORAL | 0 refills | Status: DC

## 2017-07-08 NOTE — Addendum Note (Signed)
Addended by: Tomi Likens on: 07/08/2017 04:09 PM   Modules accepted: Orders

## 2017-07-08 NOTE — Addendum Note (Signed)
Addended by: Lucretia Kern on: 07/08/2017 04:10 PM   Modules accepted: Orders

## 2017-07-08 NOTE — Addendum Note (Signed)
Addended by: Agnes Lawrence on: 07/08/2017 03:30 PM   Modules accepted: Orders

## 2017-07-08 NOTE — Progress Notes (Addendum)
HPI:  Charlene Wade is a pleasant 83 y.o. here for follow up. Chronic medical problems summarized below were reviewed for changes. Doing okay. She is taking juice plus. She has not been elevating her legs and does not take her Lasix on a regular basis. Because of this, she has had some increased swelling in her lower extremities bilaterally and now has developed some redness in the left leg over the last week or two.. Denies CP, SOB, DOE, treatment intolerance or new symptoms. Due for flu shot, labs and AWV in Jan (after 23rd)  Neck Mass: -seeing ENT  HTN/Hx Stroke/HLD/Chronic LE edema: -sees cardiologist, Dr. Gwenlyn Found -meds: plavix, lasix, losartan, tricor -wearing compression socks intermittently  GERD: -meds pantoprazole  Seasonal allergies: -meds: zyrtec, flonase  Osteoporosis/vit D def: -sees endo for management -on prolia, vit D  OAB: -meds: ditropan  Depression: -meds: welbutrin -phq9 with score 17 -reviewed with patient - she reports symptoms are fairly stable and she has a poor mood chronically -she has a lot of musculoskeletal pain and she thinks this contiributes -reports though she admits "sometimes feel I would be better off dead"Lord no,  I would NEVER hurt myself" "I already hurt enough!" -denies hallucinations, SI, thoughts of harm to self or others -feels safe -denies psychiatry or CBT intervention   ROS: See pertinent positives and negatives per HPI.  Past Medical History:  Diagnosis Date  . Allergic rhinitis   . BCC (basal cell carcinoma of skin) 04/10/2015   sees dermatologist  . Depression 10/17/2012  . Ectopic pregnancy   . GERD (gastroesophageal reflux disease)    hx hiatal hernia, has seen GI, ENT and allergist in the past  . Hyperlipidemia   . Hypertension   . OA (osteoarthritis)    knees and back, hx DDD, s/p remote lumbar disc surgery  . OAB (overactive bladder) 04/10/2015  . Obesity   . Osteoporosis    on prolia in the past  .  Stroke [facility])    sees neurologist, hx memory loss, expressive aphasia  . Venous insufficiency     Past Surgical History:  Procedure Laterality Date  . ABDOMINAL HYSTERECTOMY    . APPENDECTOMY    . BREAST REDUCTION SURGERY    . LAPAROSCOPIC SALPINGOOPHERECTOMY    . NASAL MASS EXCISION     Basal Cell Cancer Excision  . NASAL RECONSTRUCTION    . TEE WITHOUT CARDIOVERSION  10/22/2012   Procedure: TRANSESOPHAGEAL ECHOCARDIOGRAM (TEE);  Surgeon: Josue Hector, MD;  Location: [facility] ENDOSCOPY;  Service: Cardiovascular;  Laterality: N/A;    Family History  Problem Relation Age of Onset  . Hypertension Father   . Hypertension Mother   . Cancer - Lung Brother     Social History   Social History  . Marital status: Widowed    Spouse name: N/A  . Number of children: 2  . Years of education: 12   Occupational History  . retired    Social History Main Topics  . Smoking status: Former Research scientist (life sciences)  . Smokeless tobacco: Never Used  . Alcohol use No  . Drug use: No  . Sexual activity: No   Other Topics Concern  . None   Social History Narrative   Patient is single with 2 children.   Patient is right handed.   Patient has 12 th grade education.   Patient does not drink caffeine.     Current Outpatient Prescriptions:  .  acetaminophen (TYLENOL) 500 MG tablet, Take 500 mg by mouth as  needed for pain., Disp: , Rfl:  .  buPROPion (WELLBUTRIN) 75 MG tablet, take 1 tablet by mouth once daily WITH EVENING MEAL, Disp: 90 tablet, Rfl: 1 .  cetirizine (ZYRTEC) 10 MG tablet, Take 10 mg by mouth daily., Disp: , Rfl:  .  cholecalciferol (VITAMIN D) 1000 units tablet, Take 2,000 Units by mouth 2 (two) times daily., Disp: , Rfl:  .  clopidogrel (PLAVIX) 75 MG tablet, take 1 tablet by mouth once daily with BREAKFAST, Disp: 90 tablet, Rfl: 3 .  Cyanocobalamin (VITAMIN B12 PO), Take by mouth., Disp: , Rfl:  .  denosumab (PROLIA) 60 MG/ML SOLN injection, Inject 60 mg into the skin every 6 (six) months.  Administer in upper arm, thigh, or abdomen, Disp: , Rfl:  .  fenofibrate (TRICOR) 145 MG tablet, take 1 tablet by mouth once daily, Disp: 90 tablet, Rfl: 1 .  fluticasone (FLONASE) 50 MCG/ACT nasal spray, instill 1 spray into each nostril twice a day, Disp: 16 g, Rfl: 3 .  furosemide (LASIX) 20 MG tablet, Take 1 tablet (20 mg total) by mouth daily., Disp: 30 tablet, Rfl: 3 .  losartan (COZAAR) 50 MG tablet, take 1 tablet by mouth once daily, Disp: 90 tablet, Rfl: 1 .  Multiple Vitamins-Minerals (PRESERVISION AREDS PO), Take 1 tablet by mouth 2 (two) times daily., Disp: , Rfl:  .  NON FORMULARY, Juice Plus supplements, Disp: , Rfl:  .  oxybutynin (DITROPAN-XL) 5 MG 24 hr tablet, take 1 tablet by mouth once daily, Disp: 90 tablet, Rfl: 1 .  pantoprazole (PROTONIX) 20 MG tablet, take 1 tablet by mouth once daily, Disp: 90 tablet, Rfl: 1 .  UNABLE TO FIND, Med Name: fiber supplement BID, Disp: , Rfl:  .  doxycycline (VIBRA-TABS) 100 MG tablet, Take 1 tablet (100 mg total) by mouth 2 (two) times daily., Disp: 10 tablet, Rfl: 0  EXAM:  Vitals:   07/08/17 1439  BP: 138/70  Pulse: 73  Temp: 97.7 F (36.5 C)    Body mass index is 38.14 kg/m.  GENERAL: vitals reviewed and listed above, alert, oriented, appears well hydrated and in no acute distress  HEENT: atraumatic, conjunttiva clear, no obvious abnormalities on inspection of external nose and ears  NECK: no obvious masses on inspection  LUNGS: clear to auscultation bilaterally, no wheezes, rales or rhonchi, good air movement  CV: HRRR, bilateral lower extremity with skin changes per below  SKIN: area of sharply demarcated erythema of the left anterior leg edema in this region and warmth  MS: moves all extremities without noticeable abnormality  PSYCH: pleasant and cooperative, no obvious depression or anxiety  ASSESSMENT AND PLAN:  Discussed the following assessment and plan:  Cellulitis of left lower extremity -she has  tolerated and responded well with Doxy in the past, Rx sent after discussion with the patient and the daughter - they prefer to start an antibiotic given the storm is coming in -Advised elevation and regular use of her Lasix -She has been using her compression socks intermittently -Follow-up in one week  Venous insufficiency -Impression and elevation, regular use of Lasix of and advised - she is noncompliant  Essential hypertension - Plan: Basic metabolic panel, CBC CKD (chronic kidney disease) stage 3, GFR 30-59 ml/min Hyperlipidemia, unspecified hyperlipidemia type -lifestyle recommendations -Labs per orders -Continue current medications  Mood disorder (HCC)/Chronic pain -denies SI, thoughts of self harm and feels safe -see phq9, stable per pt -discussed options with her and daughter, pt declines psychiatry or CBT, she  wants to try cymbalta -stop wellbutrin and start cymbalta low dose after length discuss over 15 minutes face to face counseling  Class 2 obesity due to excess calories with serious comorbidity and body mass index (BMI) of 38.0 to 38.9 in adult -lifestyle recommendations advised, and advised a healthy diet and regular activity as tolerated  -Patient advised to return or notify a doctor immediately if symptoms worsen or persist or new concerns arise.  Patient Instructions  BEFORE YOU LEAVE: -flu shot -phq9 -follow up:  1)1 week to recheck leg 2) AWV with susan and follow up with Dr. Maudie Mercury on the same day ( Jan after the 23rd) -lab  Doxycycline for leg.  Elevated legs 30 minutes twice dialy  Lasix daily  We have ordered labs or studies at this visit. It can take up to 1-2 weeks for results and processing. IF results require follow up or explanation, we will call you with instructions. Clinically stable results will be released to your [facility]. If you have not heard from [place] or cannot find your results in [facility] in 2 weeks please contact our office at [phone].   If you are not yet signed up for [facility], please consider signing up.  Advise regular aerobic exercise (at least 150 minutes per week of sweaty exercise) and a healthy diet. Try to eat at least 5-9 servings of vegetables and fruits per day (not corn, potatoes or bananas.) Avoid sweets, red meat, pork, butter, fried foods, fast food, processed food, excessive dairy, eggs and coconut. Replace bad fats with good fats - fish, nuts and seeds, canola oil, olive oil.           Colin Benton R., DO

## 2017-07-08 NOTE — Patient Instructions (Addendum)
BEFORE YOU LEAVE: -flu shot -phq9 -follow up:  1)1 week to recheck leg 2) AWV with susan and follow up with Dr. Maudie Mercury on the same day ( Jan after the 23rd) -lab  Doxycycline for leg.  Elevated legs 30 minutes twice dialy  Lasix daily  STOP the wellbutrin  START the Cymbalta 20mg  daily for depression and pain  We have ordered labs or studies at this visit. It can take up to 1-2 weeks for results and processing. IF results require follow up or explanation, we will call you with instructions. Clinically stable results will be released to your [facility]. If you have not heard from [place] or cannot find your results in [facility] in 2 weeks please contact our office at [phone].  If you are not yet signed up for [facility], please consider signing up.  Advise regular aerobic exercise (at least 150 minutes per week of sweaty exercise) and a healthy diet. Try to eat at least 5-9 servings of vegetables and fruits per day (not corn, potatoes or bananas.) Avoid sweets, red meat, pork, butter, fried foods, fast food, processed food, excessive dairy, eggs and coconut. Replace bad fats with good fats - fish, nuts and seeds, canola oil, olive oil.

## 2017-07-09 ENCOUNTER — Ambulatory Visit (INDEPENDENT_AMBULATORY_CARE_PROVIDER_SITE_OTHER)
Admission: RE | Admit: 2017-07-09 | Discharge: 2017-07-09 | Disposition: A | Discharge status: Home / Self Care | Payer: Medicare Other | Source: Ambulatory Visit | Attending: Internal Medicine | Admitting: Internal Medicine

## 2017-07-09 DIAGNOSIS — M810 Age-related osteoporosis without current pathological fracture: Secondary | ICD-10-CM

## 2017-07-09 LAB — CBC
HEMATOCRIT: 37.4 % (ref 36.0–46.0)
Hemoglobin: 12 g/dL (ref 12.0–15.0)
MCHC: 32.2 g/dL (ref 30.0–36.0)
MCV: 90.7 fl (ref 78.0–100.0)
Platelets: 325 10*3/uL (ref 150.0–400.0)
RBC: 4.13 Mil/uL (ref 3.87–5.11)
RDW: 14.9 % (ref 11.5–15.5)
WBC: 8.9 10*3/uL (ref 4.0–10.5)

## 2017-07-09 LAB — BASIC METABOLIC PANEL
BUN: 31 mg/dL — AB (ref 6–23)
CALCIUM: 9.8 mg/dL (ref 8.4–10.5)
CHLORIDE: 103 meq/L (ref 96–112)
CO2: 26 mEq/L (ref 19–32)
CREATININE: 1.24 mg/dL — AB (ref 0.40–1.20)
GFR: 43.43 mL/min — ABNORMAL LOW (ref >60.00)
Glucose, Bld: 93 mg/dL (ref 70–99)
Potassium: 4.8 mEq/L (ref 3.5–5.1)
Sodium: 140 mEq/L (ref 135–145)

## 2017-07-10 ENCOUNTER — Telehealth: Payer: Self-pay

## 2017-07-10 NOTE — Telephone Encounter (Signed)
-----   Message from Philemon Kingdom, MD sent at 07/10/2017  1:06 PM EDT ----- Almyra Free, can you please call pt: Bone density scan results are back; T score is better at the spine level, stable at L hip and decreased slightly at R hip. We should continue Prolia.

## 2017-07-11 DIAGNOSIS — M1712 Unilateral primary osteoarthritis, left knee: Secondary | ICD-10-CM | POA: Diagnosis not present

## 2017-07-15 ENCOUNTER — Ambulatory Visit: Payer: Medicare Other | Admitting: Family Medicine

## 2017-07-15 ENCOUNTER — Ambulatory Visit (INDEPENDENT_AMBULATORY_CARE_PROVIDER_SITE_OTHER): Payer: Medicare Other | Admitting: Family Medicine

## 2017-07-15 ENCOUNTER — Encounter: Payer: Self-pay | Admitting: Family Medicine

## 2017-07-15 VITALS — BP 148/70 | HR 75 | Temp 98.2°F | Ht <= 58 in | Wt 166.5 lb

## 2017-07-15 DIAGNOSIS — L03116 Cellulitis of left lower limb: Secondary | ICD-10-CM

## 2017-07-15 DIAGNOSIS — D1721 Benign lipomatous neoplasm of skin and subcutaneous tissue of right arm: Secondary | ICD-10-CM | POA: Diagnosis not present

## 2017-07-15 DIAGNOSIS — Z23 Encounter for immunization: Secondary | ICD-10-CM | POA: Diagnosis not present

## 2017-07-15 DIAGNOSIS — R609 Edema, unspecified: Secondary | ICD-10-CM | POA: Diagnosis not present

## 2017-07-15 DIAGNOSIS — L821 Other seborrheic keratosis: Secondary | ICD-10-CM | POA: Diagnosis not present

## 2017-07-15 DIAGNOSIS — D225 Melanocytic nevi of trunk: Secondary | ICD-10-CM | POA: Diagnosis not present

## 2017-07-15 DIAGNOSIS — I872 Venous insufficiency (chronic) (peripheral): Secondary | ICD-10-CM | POA: Diagnosis not present

## 2017-07-15 DIAGNOSIS — Z85828 Personal history of other malignant neoplasm of skin: Secondary | ICD-10-CM | POA: Diagnosis not present

## 2017-07-15 DIAGNOSIS — Z808 Family history of malignant neoplasm of other organs or systems: Secondary | ICD-10-CM | POA: Diagnosis not present

## 2017-07-15 NOTE — Patient Instructions (Signed)
BEFORE YOU LEAVE: -follow up:in 4-6 weeks - Chronic pain and depression  Take the Lasix every single day for the next 2 weeks, then take once daily as many days per week as you are able.  Continue compression socks.

## 2017-07-15 NOTE — Progress Notes (Signed)
HPI:  Follow-up cellulitis and lower extremity edema: -Left lower extremity cellulitis -Feels is improving -Wearing compression socks -Takes her Lasix intermittently, makes her PE so she doesn't take on days that she goes out -no fevers, malaise, worsening redness or swelling  ROS: See pertinent positives and negatives per HPI.  Past Medical History:  Diagnosis Date  . Allergic rhinitis   . BCC (basal cell carcinoma of skin) 04/10/2015   sees dermatologist  . Depression 10/17/2012  . Ectopic pregnancy   . GERD (gastroesophageal reflux disease)    hx hiatal hernia, has seen GI, ENT and allergist in the past  . Hyperlipidemia   . Hypertension   . OA (osteoarthritis)    knees and back, hx DDD, s/p remote lumbar disc surgery  . OAB (overactive bladder) 04/10/2015  . Obesity   . Osteoporosis    on prolia in the past  . Stroke [facility])    sees neurologist, hx memory loss, expressive aphasia  . Venous insufficiency     Past Surgical History:  Procedure Laterality Date  . ABDOMINAL HYSTERECTOMY    . APPENDECTOMY    . BREAST REDUCTION SURGERY    . LAPAROSCOPIC SALPINGOOPHERECTOMY    . NASAL MASS EXCISION     Basal Cell Cancer Excision  . NASAL RECONSTRUCTION    . TEE WITHOUT CARDIOVERSION  10/22/2012   Procedure: TRANSESOPHAGEAL ECHOCARDIOGRAM (TEE);  Surgeon: Josue Hector, MD;  Location: [facility] ENDOSCOPY;  Service: Cardiovascular;  Laterality: N/A;    Family History  Problem Relation Age of Onset  . Hypertension Father   . Hypertension Mother   . Cancer - Lung Brother     Social History   Social History  . Marital status: Widowed    Spouse name: N/A  . Number of children: 2  . Years of education: 12   Occupational History  . retired    Social History Main Topics  . Smoking status: Former Research scientist (life sciences)  . Smokeless tobacco: Never Used  . Alcohol use No  . Drug use: No  . Sexual activity: No   Other Topics Concern  . None   Social History Narrative   Patient is  single with 2 children.   Patient is right handed.   Patient has 12 th grade education.   Patient does not drink caffeine.     Current Outpatient Prescriptions:  .  acetaminophen (TYLENOL) 500 MG tablet, Take 500 mg by mouth as needed for pain., Disp: , Rfl:  .  cetirizine (ZYRTEC) 10 MG tablet, Take 10 mg by mouth daily., Disp: , Rfl:  .  cholecalciferol (VITAMIN D) 1000 units tablet, Take 2,000 Units by mouth 2 (two) times daily., Disp: , Rfl:  .  clopidogrel (PLAVIX) 75 MG tablet, take 1 tablet by mouth once daily with BREAKFAST, Disp: 90 tablet, Rfl: 3 .  Cyanocobalamin (VITAMIN B12 PO), Take by mouth., Disp: , Rfl:  .  denosumab (PROLIA) 60 MG/ML SOLN injection, Inject 60 mg into the skin every 6 (six) months. Administer in upper arm, thigh, or abdomen, Disp: , Rfl:  .  doxycycline (VIBRA-TABS) 100 MG tablet, Take 1 tablet (100 mg total) by mouth 2 (two) times daily., Disp: 10 tablet, Rfl: 0 .  DULoxetine (CYMBALTA) 20 MG capsule, Take 1 capsule (20 mg total) by mouth daily., Disp: 30 capsule, Rfl: 3 .  fenofibrate (TRICOR) 145 MG tablet, take 1 tablet by mouth once daily, Disp: 90 tablet, Rfl: 1 .  fluticasone (FLONASE) 50 MCG/ACT nasal spray, instill 1  spray into each nostril twice a day, Disp: 16 g, Rfl: 3 .  furosemide (LASIX) 20 MG tablet, Take 1 tablet (20 mg total) by mouth daily., Disp: 30 tablet, Rfl: 3 .  losartan (COZAAR) 50 MG tablet, take 1 tablet by mouth once daily, Disp: 90 tablet, Rfl: 1 .  Multiple Vitamins-Minerals (PRESERVISION AREDS PO), Take 1 tablet by mouth 2 (two) times daily., Disp: , Rfl:  .  NON FORMULARY, Juice Plus supplements, Disp: , Rfl:  .  oxybutynin (DITROPAN-XL) 5 MG 24 hr tablet, take 1 tablet by mouth once daily, Disp: 90 tablet, Rfl: 1 .  pantoprazole (PROTONIX) 20 MG tablet, take 1 tablet by mouth once daily, Disp: 90 tablet, Rfl: 1 .  UNABLE TO FIND, Med Name: fiber supplement BID, Disp: , Rfl:   EXAM:  Vitals:   07/15/17 1253  BP: (!)  148/70  Pulse: 75  Temp: 98.2 F (36.8 C)    Body mass index is 37.33 kg/m.  GENERAL: vitals reviewed and listed above, alert, oriented, appears well hydrated and in no acute distress  HEENT: atraumatic, conjunttiva clear, no obvious abnormalities on inspection of external nose and ears  NECK: no obvious masses on inspection  LUNGS: clear to auscultation bilaterally, no wheezes, rales or rhonchi, good air movement  CV: HRRR, plan her compression socks, bilateral ankle edema  Skin: Improvement in erythema and warmth of the left lower extremity  MS: moves all extremities without noticeable abnormality  PSYCH: pleasant and cooperative, no obvious depression or anxiety  ASSESSMENT AND PLAN:  Discussed the following assessment and plan:  Cellulitis of left lower extremity  -Leg cellulitis has resolved -Encouraged daily use of her Lasix -Continued compression and elevation -Patient advised to return or notify a doctor immediately if symptoms worsen or persist or new concerns arise.  Patient Instructions  BEFORE YOU LEAVE: -follow up:in 4-6 weeks - Chronic pain and depression  Take the Lasix every single day for the next 2 weeks, then take once daily as many days per week as you are able.  Continue compression socks.      Colin Benton R., DO

## 2017-07-18 DIAGNOSIS — M1712 Unilateral primary osteoarthritis, left knee: Secondary | ICD-10-CM | POA: Diagnosis not present

## 2017-07-21 ENCOUNTER — Encounter (INDEPENDENT_AMBULATORY_CARE_PROVIDER_SITE_OTHER): Payer: Medicare Other | Admitting: Ophthalmology

## 2017-07-21 DIAGNOSIS — H353122 Nonexudative age-related macular degeneration, left eye, intermediate dry stage: Secondary | ICD-10-CM | POA: Diagnosis not present

## 2017-07-21 DIAGNOSIS — H353114 Nonexudative age-related macular degeneration, right eye, advanced atrophic with subfoveal involvement: Secondary | ICD-10-CM

## 2017-07-21 DIAGNOSIS — H35033 Hypertensive retinopathy, bilateral: Secondary | ICD-10-CM | POA: Diagnosis not present

## 2017-07-21 DIAGNOSIS — I10 Essential (primary) hypertension: Secondary | ICD-10-CM | POA: Diagnosis not present

## 2017-07-21 DIAGNOSIS — H43813 Vitreous degeneration, bilateral: Secondary | ICD-10-CM | POA: Diagnosis not present

## 2017-08-05 ENCOUNTER — Other Ambulatory Visit: Payer: Self-pay | Admitting: Family Medicine

## 2017-08-19 ENCOUNTER — Telehealth: Payer: Self-pay | Admitting: Family Medicine

## 2017-08-19 NOTE — Telephone Encounter (Signed)
° ° ° °  [name] pt care given call to ask for a RX for a life chair. The would to get it from [facility] formally [place] giver will pick up RX when ready    [phone]

## 2017-08-21 NOTE — Telephone Encounter (Signed)
I called Charlene Wade and she stated the pt has had a lift chair before and is now requesting an Rx for a new one.  She stated the pt does have a hard time getting up from any seat and the pt asked for a lift chair.  Message sent to Dr Maudie Mercury.

## 2017-08-21 NOTE — Telephone Encounter (Signed)
I am not sure what this is? Would need specific name and diagnosis for medical supply needed. Thanks.

## 2017-08-21 NOTE — Telephone Encounter (Signed)
Per Charlene Wade pt is requesting an order for a LIFT chair.

## 2017-08-21 NOTE — Telephone Encounter (Signed)
I called [facility] and informed her the Rx was left at the front desk for her to pick up.

## 2017-08-21 NOTE — Telephone Encounter (Signed)
Please call. Could you find out from pt/caregiver more details. Why feels needs lift, medical diagnosis, etc? If to weak to get out of chair may need/want to consider PT? Thanks.

## 2017-08-21 NOTE — Telephone Encounter (Signed)
Thanks. rx on your desk.

## 2017-08-27 ENCOUNTER — Other Ambulatory Visit: Payer: Self-pay | Admitting: Family Medicine

## 2017-08-29 DIAGNOSIS — M7062 Trochanteric bursitis, left hip: Secondary | ICD-10-CM | POA: Diagnosis not present

## 2017-08-29 DIAGNOSIS — M1712 Unilateral primary osteoarthritis, left knee: Secondary | ICD-10-CM | POA: Diagnosis not present

## 2017-09-02 ENCOUNTER — Encounter: Payer: Self-pay | Admitting: Family Medicine

## 2017-09-02 ENCOUNTER — Ambulatory Visit (INDEPENDENT_AMBULATORY_CARE_PROVIDER_SITE_OTHER): Payer: Medicare Other | Admitting: Family Medicine

## 2017-09-02 VITALS — BP 126/68 | HR 75 | Temp 97.5°F | Ht <= 58 in | Wt 167.7 lb

## 2017-09-02 DIAGNOSIS — I10 Essential (primary) hypertension: Secondary | ICD-10-CM

## 2017-09-02 DIAGNOSIS — F3341 Major depressive disorder, recurrent, in partial remission: Secondary | ICD-10-CM | POA: Insufficient documentation

## 2017-09-02 DIAGNOSIS — G8929 Other chronic pain: Secondary | ICD-10-CM

## 2017-09-02 NOTE — Patient Instructions (Addendum)
BEFORE YOU LEAVE: -follow up: 3 months  Continue cymbalta.  Have a happy holiday season!

## 2017-09-02 NOTE — Progress Notes (Signed)
HPI:  Follow up visit for Chronic pain and depression: -started Cymbalta 06/2017 (complained of chronic pain and mild depression for years after visit and wanted medication, we opted to try cymbalta) -reports: maybe a little better, mood ok -denies: SI, thoughts of self harm -sees ortho for the OA  ROS: See pertinent positives and negatives per HPI.  Past Medical History:  Diagnosis Date  . Allergic rhinitis   . BCC (basal cell carcinoma of skin) 04/10/2015   sees dermatologist  . Depression 10/17/2012  . Ectopic pregnancy   . GERD (gastroesophageal reflux disease)    hx hiatal hernia, has seen GI, ENT and allergist in the past  . Hyperlipidemia   . Hypertension   . OA (osteoarthritis)    knees and back, hx DDD, s/p remote lumbar disc surgery  . OAB (overactive bladder) 04/10/2015  . Obesity   . Osteoporosis    on prolia in the past  . Stroke [facility])    sees neurologist, hx memory loss, expressive aphasia  . Venous insufficiency     Past Surgical History:  Procedure Laterality Date  . ABDOMINAL HYSTERECTOMY    . APPENDECTOMY    . BREAST REDUCTION SURGERY    . LAPAROSCOPIC SALPINGOOPHERECTOMY    . NASAL MASS EXCISION     Basal Cell Cancer Excision  . NASAL RECONSTRUCTION      Family History  Problem Relation Age of Onset  . Hypertension Father   . Hypertension Mother   . Cancer - Lung Brother     Social History   Socioeconomic History  . Marital status: Widowed    Spouse name: None  . Number of children: 2  . Years of education: 74  . Highest education level: None  Social Needs  . Financial resource strain: None  . Food insecurity - worry: None  . Food insecurity - inability: None  . Transportation needs - medical: None  . Transportation needs - non-medical: None  Occupational History  . Occupation: retired  Tobacco Use  . Smoking status: Former Research scientist (life sciences)  . Smokeless tobacco: Never Used  Substance and Sexual Activity  . Alcohol use: No  . Drug use: No   . Sexual activity: No  Other Topics Concern  . None  Social History Narrative   Patient is single with 2 children.   Patient is right handed.   Patient has 12 th grade education.   Patient does not drink caffeine.     Current Outpatient Medications:  .  acetaminophen (TYLENOL) 500 MG tablet, Take 500 mg by mouth as needed for pain., Disp: , Rfl:  .  cetirizine (ZYRTEC) 10 MG tablet, Take 10 mg by mouth daily., Disp: , Rfl:  .  cholecalciferol (VITAMIN D) 1000 units tablet, Take 2,000 Units by mouth 2 (two) times daily., Disp: , Rfl:  .  clopidogrel (PLAVIX) 75 MG tablet, take 1 tablet by mouth once daily with BREAKFAST, Disp: 90 tablet, Rfl: 3 .  Cyanocobalamin (VITAMIN B12 PO), Take by mouth., Disp: , Rfl:  .  denosumab (PROLIA) 60 MG/ML SOLN injection, Inject 60 mg into the skin every 6 (six) months. Administer in upper arm, thigh, or abdomen, Disp: , Rfl:  .  doxycycline (VIBRA-TABS) 100 MG tablet, Take 1 tablet (100 mg total) by mouth 2 (two) times daily., Disp: 10 tablet, Rfl: 0 .  DULoxetine (CYMBALTA) 20 MG capsule, Take 1 capsule (20 mg total) by mouth daily., Disp: 30 capsule, Rfl: 3 .  fenofibrate (TRICOR) 145 MG tablet, take  1 tablet by mouth once daily, Disp: 90 tablet, Rfl: 1 .  fluticasone (FLONASE) 50 MCG/ACT nasal spray, instill 1 spray into each nostril twice a day, Disp: 16 g, Rfl: 3 .  furosemide (LASIX) 20 MG tablet, Take 1 tablet (20 mg total) by mouth daily., Disp: 30 tablet, Rfl: 3 .  losartan (COZAAR) 50 MG tablet, take 1 tablet by mouth once daily, Disp: 90 tablet, Rfl: 1 .  Multiple Vitamins-Minerals (PRESERVISION AREDS PO), Take 1 tablet by mouth 2 (two) times daily., Disp: , Rfl:  .  NON FORMULARY, Juice Plus supplements, Disp: , Rfl:  .  oxybutynin (DITROPAN-XL) 5 MG 24 hr tablet, take 1 tablet by mouth once daily, Disp: 90 tablet, Rfl: 1 .  pantoprazole (PROTONIX) 20 MG tablet, take 1 tablet by mouth once daily, Disp: 90 tablet, Rfl: 1 .  UNABLE TO FIND,  Med Name: fiber supplement BID, Disp: , Rfl:   EXAM:  Vitals:   09/02/17 1605  BP: 126/68  Pulse: 75  Temp: (!) 97.5 F (36.4 C)    Body mass index is 37.6 kg/m.  GENERAL: vitals reviewed and listed above, alert, oriented, appears well hydrated and in no acute distress  HEENT: atraumatic, conjunttiva clear, no obvious abnormalities on inspection of external nose and ears  NECK: no obvious masses on inspection  LUNGS: clear to auscultation bilaterally, no wheezes, rales or rhonchi, good air movement  CV: HRRR, no peripheral edema  MS: moves all extremities without noticeable abnormality  PSYCH: pleasant and cooperative, no obvious depression or anxiety  ASSESSMENT AND PLAN:  Discussed the following assessment and plan:  Recurrent major depressive disorder, in partial remission ([place])  Other chronic pain  Essential hypertension  -doing well -BP good on recheck -continue cymbalta -advised regular gentle activity -Patient advised to return or notify a doctor immediately if symptoms worsen or persist or new concerns arise.  Patient Instructions  BEFORE YOU LEAVE: -follow up: 3 months  Continue cymbalta.  Have a happy holiday season!      Colin Benton R., DO

## 2017-09-10 DIAGNOSIS — M7062 Trochanteric bursitis, left hip: Secondary | ICD-10-CM | POA: Diagnosis not present

## 2017-09-16 DIAGNOSIS — M7062 Trochanteric bursitis, left hip: Secondary | ICD-10-CM | POA: Diagnosis not present

## 2017-09-19 DIAGNOSIS — J189 Pneumonia, unspecified organism: Secondary | ICD-10-CM | POA: Diagnosis not present

## 2017-09-20 ENCOUNTER — Ambulatory Visit (INDEPENDENT_AMBULATORY_CARE_PROVIDER_SITE_OTHER): Payer: Medicare Other | Admitting: Family Medicine

## 2017-09-20 ENCOUNTER — Encounter: Payer: Self-pay | Admitting: Family Medicine

## 2017-09-20 VITALS — BP 138/86 | HR 70 | Temp 98.5°F | Resp 18 | Ht <= 58 in | Wt 170.0 lb

## 2017-09-20 DIAGNOSIS — J181 Lobar pneumonia, unspecified organism: Secondary | ICD-10-CM | POA: Diagnosis not present

## 2017-09-20 DIAGNOSIS — J189 Pneumonia, unspecified organism: Secondary | ICD-10-CM

## 2017-09-20 MED ORDER — DOXYCYCLINE HYCLATE 100 MG PO TABS: 100.0000 mg | ORAL_TABLET | Freq: Two times a day (BID) | ORAL | 0 refills | Status: DC

## 2017-09-20 NOTE — Progress Notes (Signed)
Pre visit review using our clinic review tool, if applicable. No additional management support is needed unless otherwise documented below in the visit note. 

## 2017-09-20 NOTE — Progress Notes (Signed)
Chief Complaint  Patient presents with  . Cough    and chest congestion; diagnosed with pneumonia- may be worse     Charlene Wade here for URI complaints.  Duration: 2 days  Associated symptoms: sinus headache, rhinorrhea, shortness of breath and productive cough Denies: sinus pain, itchy watery eyes, ear pain, ear drainage, sore throat, myalgia and fevers/rigors Treatment to date: Zpak Sick contacts: No  ROS:  Const: Denies fevers HEENT: As noted in HPI Lungs: +SOB  Past Medical History:  Diagnosis Date  . Allergic rhinitis   . BCC (basal cell carcinoma of skin) 04/10/2015   sees dermatologist  . Depression 10/17/2012  . Ectopic pregnancy   . GERD (gastroesophageal reflux disease)    hx hiatal hernia, has seen GI, ENT and allergist in the past  . Hyperlipidemia   . Hypertension   . OA (osteoarthritis)    knees and back, hx DDD, s/p remote lumbar disc surgery  . OAB (overactive bladder) 04/10/2015  . Obesity   . Osteoporosis    on prolia in the past  . Stroke [facility])    sees neurologist, hx memory loss, expressive aphasia  . Venous insufficiency    Family History  Problem Relation Age of Onset  . Hypertension Father   . Hypertension Mother   . Cancer - Lung Brother     BP 138/86 (BP Location: Left Arm, Patient Position: Sitting, Cuff Size: Normal)   Pulse 70   Temp 98.5 F (36.9 C) (Oral)   Resp 18   Ht 4\' 8"  (1.422 m)   Wt 170 lb (77.1 kg)   SpO2 95%   BMI 38.11 kg/m  General: Awake, alert, appears stated age HEENT: AT, [place], ears patent b/l and TM's neg, nares patent w/o discharge, pharynx pink and without exudates, MMM Neck: No masses or asymmetry Heart: RRR, no bruits Lungs: CTAB, no accessory muscle use Psych: Age appropriate judgment and insight, normal mood and affect  Community acquired pneumonia of right lower lobe of lung (HCC)  Change azithromycin to doxycycline.  She looks clinically well and is able to joke around.  She does not have any  conversational dyspnea or signs of accessory muscle use.  I do not think I need to send her to the hospital at this point. Continue to push fluids, practice good hand hygiene, cover mouth when coughing. F/u prn.  Go to the emergency department if symptoms worsen or fail to improve. Pt voiced understanding and agreement to the plan.  [place], DO 09/20/17 11:57 AM

## 2017-09-20 NOTE — Patient Instructions (Addendum)
Stop the Zpak.  Take the new medicine with food.   Continue to push fluids, practice good hand hygiene, and cover your mouth if you cough.  Let us know if you need anything.

## 2017-09-22 ENCOUNTER — Ambulatory Visit (INDEPENDENT_AMBULATORY_CARE_PROVIDER_SITE_OTHER): Payer: Medicare Other | Admitting: Family Medicine

## 2017-09-22 ENCOUNTER — Telehealth: Payer: Self-pay | Admitting: *Deleted

## 2017-09-22 ENCOUNTER — Encounter: Payer: Self-pay | Admitting: Family Medicine

## 2017-09-22 VITALS — BP 120/80 | HR 90 | Temp 98.5°F | Ht <= 58 in

## 2017-09-22 DIAGNOSIS — J189 Pneumonia, unspecified organism: Secondary | ICD-10-CM

## 2017-09-22 DIAGNOSIS — J181 Lobar pneumonia, unspecified organism: Secondary | ICD-10-CM

## 2017-09-22 MED ORDER — BENZONATATE 100 MG PO CAPS: 100.0000 mg | ORAL_CAPSULE | Freq: Three times a day (TID) | ORAL | 0 refills | Status: DC | PRN

## 2017-09-22 NOTE — Patient Instructions (Signed)
Continue the doxycycline.  Use the Tessalon as needed for cough per instructions.  Go to the hospital if you are worsening or you are not improving significantly over the next 12-24 hours.

## 2017-09-22 NOTE — Telephone Encounter (Signed)
Patient Name: Charlene Wade Gender: Female DOB: 08/29/1928 Age: 83 Y 4 M 2 D Return Phone Number: [phone] (Primary) Address: City/State/Zip: [place] [address] Client [facility] - Client Client Site [facility] - Night Physician Colin Benton- MD Contact Type Call Who Is Calling Patient / Member / Family / Caregiver Call Type Triage / Clinical Caller Name Sabas Sous Relationship To Patient Care [place] Return Phone Number [phone] (Primary) Chief Complaint BREATHING - shortness of breath or sounds breathless Reason for Call Symptomatic / Request for [place] states yesterday her patient went to UC , Dx with pneumonia, now has labored breathing Translation No Nurse Assessment Nurse: Wynetta Emery, RN, Baker Janus Date/Time Eilene Ghazi Time): 09/20/2017 10:02:56 AM Confirm and document reason for call. If symptomatic, describe symptoms. ---Seen in UC for cough dx with PNA and started antibiotics; presents with shortness of breath. Does the patient have any new or worsening symptoms? ---Yes Will a triage be completed? ---Yes Related visit to physician within the last 2 weeks? ---Yes Does the PT have any chronic conditions? (i.e. diabetes, asthma, etc.) ---Yes List chronic conditions. ---PNA Is this a behavioral health or substance abuse call? ---No Guidelines Guideline Title Affirmed Question Affirmed Notes Nurse Date/Time (Eastern Time) Pneumonia on Antibiotic Follow-up Call [1] Taking antibiotic > 24 hours for pneumonia AND [2] breathing is worse Ivin Booty 09/20/2017 10:04:57 AM Disp. Time Eilene Ghazi Time) Disposition Final User 09/20/2017 10:01:08 AM Send to Urgent Queue Eather Colas 09/20/2017 10:01:25 AM Send to Urgent Queue Eather Colas 09/20/2017 10:08:07 AM Call PCP Now Yes Wynetta Emery, RN, Juleen [place] NOTE: All timestamps contained within this report are represented as [place]  Standard Time. CONFIDENTIALTY NOTICE: This fax transmission is intended only for the addressee. It contains information that is legally privileged, confidential or otherwise protected from use or disclosure. If you are not the intended recipient, you are strictly prohibited from reviewing, disclosing, copying using or disseminating any of this information or taking any action in reliance on or regarding this information. If you have received this fax in error, please notify us immediately by telephone so that we can arrange for its return to [place]. Phone: [phone], Toll-Free: [phone], Fax: [phone] Page: 2 of 2 Call Id: [id number] [place] Disagree/Comply Comply Caller Understands Yes PreDisposition Call Doctor Care Advice Given Per Guideline CALL PCP NOW: You need to discuss this with your doctor. I'll page him now. If you haven't heard from the on-call doctor within 30 minutes, call again. CARE ADVICE given per Pneumonia on Antibiotic Follow-Up Call ([place]) guideline.

## 2017-09-22 NOTE — Telephone Encounter (Signed)
[facility] by Dr. Nani Ravens and seen today in office by PCP.

## 2017-09-22 NOTE — Progress Notes (Signed)
HPI:  Acute visit for respiratory illness: -started: 4 days ago, seen at [facility] urgent care 11/23 with right basilar infiltrate on chest x-ray and was given Z-Pak, seen in our urgent care over the holiday that she was not improving and then was given doxycycline  -symptoms:nasal congestion, sore throat, cough, sinus headache, mild shortness of breath initially, then a very bad cough and shortness of breath -she has felt quite tired, had a little bit of dizziness this morning and has a persistent bad cough that is productive of clear to yellow mucus and mild shortness of breath.  Overall, she thinks she has started to improve since starting the doxycycline.  He has had a poor appetite and has not had much to eat or drink the last 2 days.  Her daughter feels like she is doing better today with oral intake. -Denies fevers, wheezing, chest pain, rash, nausea, vomiting, diarrhea -has tried: Robitussin Z-Pak, doxycycline last day and a half -sick contacts/travel/risks: no reported flu, strep or tick exposure  ROS: See pertinent positives and negatives per HPI.  Past Medical History:  Diagnosis Date  . Allergic rhinitis   . BCC (basal cell carcinoma of skin) 04/10/2015   sees dermatologist  . Depression 10/17/2012  . Ectopic pregnancy   . GERD (gastroesophageal reflux disease)    hx hiatal hernia, has seen GI, ENT and allergist in the past  . Hyperlipidemia   . Hypertension   . OA (osteoarthritis)    knees and back, hx DDD, s/p remote lumbar disc surgery  . OAB (overactive bladder) 04/10/2015  . Obesity   . Osteoporosis    on prolia in the past  . Stroke [facility])    sees neurologist, hx memory loss, expressive aphasia  . Venous insufficiency     Past Surgical History:  Procedure Laterality Date  . ABDOMINAL HYSTERECTOMY    . APPENDECTOMY    . BREAST REDUCTION SURGERY    . LAPAROSCOPIC SALPINGOOPHERECTOMY    . NASAL MASS EXCISION     Basal Cell Cancer Excision  . NASAL RECONSTRUCTION     . TEE WITHOUT CARDIOVERSION  10/22/2012   Procedure: TRANSESOPHAGEAL ECHOCARDIOGRAM (TEE);  Surgeon: Josue Hector, MD;  Location: [facility] ENDOSCOPY;  Service: Cardiovascular;  Laterality: N/A;    Family History  Problem Relation Age of Onset  . Hypertension Father   . Hypertension Mother   . Cancer - Lung Brother     Social History   Socioeconomic History  . Marital status: Widowed    Spouse name: None  . Number of children: 2  . Years of education: 55  . Highest education level: None  Social Needs  . Financial resource strain: None  . Food insecurity - worry: None  . Food insecurity - inability: None  . Transportation needs - medical: None  . Transportation needs - non-medical: None  Occupational History  . Occupation: retired  Tobacco Use  . Smoking status: Former Research scientist (life sciences)  . Smokeless tobacco: Never Used  Substance and Sexual Activity  . Alcohol use: No  . Drug use: No  . Sexual activity: No  Other Topics Concern  . None  Social History Narrative   Patient is single with 2 children.   Patient is right handed.   Patient has 12 th grade education.   Patient does not drink caffeine.     Current Outpatient Medications:  .  acetaminophen (TYLENOL) 500 MG tablet, Take 500 mg by mouth as needed for pain., Disp: , Rfl:  .  cetirizine (ZYRTEC) 10 MG tablet, Take 10 mg by mouth daily., Disp: , Rfl:  .  cholecalciferol (VITAMIN D) 1000 units tablet, Take 2,000 Units by mouth 2 (two) times daily., Disp: , Rfl:  .  clopidogrel (PLAVIX) 75 MG tablet, take 1 tablet by mouth once daily with BREAKFAST, Disp: 90 tablet, Rfl: 3 .  Cyanocobalamin (VITAMIN B12 PO), Take by mouth., Disp: , Rfl:  .  denosumab (PROLIA) 60 MG/ML SOLN injection, Inject 60 mg into the skin every 6 (six) months. Administer in upper arm, thigh, or abdomen, Disp: , Rfl:  .  doxycycline (VIBRA-TABS) 100 MG tablet, Take 1 tablet (100 mg total) by mouth 2 (two) times daily., Disp: 20 tablet, Rfl: 0 .  DULoxetine  (CYMBALTA) 20 MG capsule, Take 1 capsule (20 mg total) by mouth daily., Disp: 30 capsule, Rfl: 3 .  fenofibrate (TRICOR) 145 MG tablet, take 1 tablet by mouth once daily, Disp: 90 tablet, Rfl: 1 .  fluticasone (FLONASE) 50 MCG/ACT nasal spray, instill 1 spray into each nostril twice a day, Disp: 16 g, Rfl: 3 .  furosemide (LASIX) 20 MG tablet, Take 1 tablet (20 mg total) by mouth daily., Disp: 30 tablet, Rfl: 3 .  losartan (COZAAR) 50 MG tablet, take 1 tablet by mouth once daily, Disp: 90 tablet, Rfl: 1 .  Multiple Vitamins-Minerals (PRESERVISION AREDS PO), Take 1 tablet by mouth 2 (two) times daily., Disp: , Rfl:  .  NON FORMULARY, Juice Plus supplements, Disp: , Rfl:  .  oxybutynin (DITROPAN-XL) 5 MG 24 hr tablet, take 1 tablet by mouth once daily, Disp: 90 tablet, Rfl: 1 .  pantoprazole (PROTONIX) 20 MG tablet, take 1 tablet by mouth once daily, Disp: 90 tablet, Rfl: 1 .  UNABLE TO FIND, Med Name: fiber supplement BID, Disp: , Rfl:  .  benzonatate (TESSALON PERLES) 100 MG capsule, Take 1 capsule (100 mg total) by mouth 3 (three) times daily as needed., Disp: 20 capsule, Rfl: 0  EXAM:  Vitals:   09/22/17 1026  BP: 120/80  Pulse: 90  Temp: 98.5 F (36.9 C)  SpO2: 95%    Body mass index is 38.11 kg/m.  GENERAL: vitals reviewed and listed above, alert, oriented, appears well hydrated and in no acute distress  HEENT: atraumatic, conjunttiva clear, no obvious abnormalities on inspection of external nose and ears, normal appearance of ear canals and TMs, clear nasal congestion, mild post oropharyngeal erythema with PND, no tonsillar edema or exudate, no sinus TTP  NECK: no obvious masses on inspection  LUNGS: clear to auscultation bilaterally except for crackles right base, no retractions, tachypnea or signs of respiratory distress  CV: HRRR  MS: moves all extremities without noticeable abnormality  PSYCH: pleasant and cooperative, no obvious depression or anxiety  ASSESSMENT AND  PLAN:  Discussed the following assessment and plan:  Community acquired pneumonia of right lower lobe of lung ([place])  -X-ray report is a soft call on the pneumonia, however exam reveals crackles in the right base consistent with infiltrate here -She continues to have a number of symptoms, however she her daughter feel like she may be improving on the doxycycline -She was encouraged to eat small healthy meals today and drink plenty of fluids -After lengthy conversation, she has opted to continue the doxycycline close monitoring over the next 12 hours and she is to go to the hospital if she is worsening or if she is not improving significantly over the next day -Tessalon for cough  Patient Instructions  Continue the doxycycline.  Use the Tessalon as needed for cough per instructions.  Go to the hospital if you are worsening or you are not improving significantly over the next 12-24 hours.   Colin Benton R., DO

## 2017-09-23 ENCOUNTER — Ambulatory Visit: Payer: Self-pay

## 2017-09-23 NOTE — Telephone Encounter (Signed)
  Reason for Disposition . [1] Taking antibiotic < 24 hours for pneumonia AND [2] new onset of fever  Answer Assessment - Initial Assessment Questions 1. SYMPTOMS: "What symptoms are you most concerned about?" "Is it better, the same, or worse compared to when you saw the doctor?"     Still weak. Not coughing as breakfast. 2. BREATHING DIFFICULTY: "Are you having any difficulty breathing?" If so, ask "How bad is it?"  (e.g., none, mild, moderate, severe)   - MILD: No SOB at rest, mild SOB with walking, speaks normally in sentences, can lay down, no retractions, pulse < 100.    - MODERATE: SOB at rest, SOB with minimal exertion and prefers to sit, cannot lie down flat, speaks in phrases, mild retractions, audible wheezing, pulse 100-120.    - SEVERE: Very SOB at rest, speaks in single words, struggling to breathe, sitting hunched forward, retractions, pulse > 120      Breathing and shortness is better. 3. FEVER: "Do you have a fever?" If so, ask: "What is your temperature, how was it measured, and when did it start?"     No 4. SPUTUM: "Describe the color of your sputum" (clear, white, yellow, green, blood-tinged)     Clear 5. DIAGNOSIS CONFIRMATION: "When was the pneumonia diagnosed?" "By whom?"     Yesterday. 6. ANTIBIOTIC: "Are you taking an antibiotic?"  If so, "Which one?" "When was it started?"     2 days ago 7. OTHER TREATMENT: "Are you receiving any other treatment for the pneumonia?" (e.g., albuterol nebulizer, oxygen) If so, ask, "How often?" and "Do they help?"     Tessalon Perles are helping. 8. HOSPITAL ADMISSION: "Were you hospitalized for this pneumonia?" If so, ask "When were you discharged home from the hospital?"     No  Protocols used: PNEUMONIA ON ANTIBIOTIC FOLLOW-UP CALL-A-AH

## 2017-09-26 ENCOUNTER — Encounter (HOSPITAL_COMMUNITY): Payer: Self-pay | Admitting: Emergency Medicine

## 2017-09-26 ENCOUNTER — Ambulatory Visit: Payer: Self-pay | Admitting: *Deleted

## 2017-09-26 ENCOUNTER — Inpatient Hospital Stay (HOSPITAL_COMMUNITY)
Admission: EM | Admit: 2017-09-26 | Discharge: 2017-09-28 | DRG: 308 | Disposition: A | Discharge status: Home / Self Care | Payer: Medicare Other | Attending: Internal Medicine | Admitting: Internal Medicine

## 2017-09-26 ENCOUNTER — Emergency Department (HOSPITAL_COMMUNITY): Payer: Medicare Other

## 2017-09-26 DIAGNOSIS — Z8249 Family history of ischemic heart disease and other diseases of the circulatory system: Secondary | ICD-10-CM | POA: Diagnosis not present

## 2017-09-26 DIAGNOSIS — Z6837 Body mass index (BMI) 37.0-37.9, adult: Secondary | ICD-10-CM | POA: Diagnosis not present

## 2017-09-26 DIAGNOSIS — E669 Obesity, unspecified: Secondary | ICD-10-CM | POA: Diagnosis present

## 2017-09-26 DIAGNOSIS — Z87891 Personal history of nicotine dependence: Secondary | ICD-10-CM | POA: Diagnosis not present

## 2017-09-26 DIAGNOSIS — N3941 Urge incontinence: Secondary | ICD-10-CM | POA: Diagnosis present

## 2017-09-26 DIAGNOSIS — Z85828 Personal history of other malignant neoplasm of skin: Secondary | ICD-10-CM

## 2017-09-26 DIAGNOSIS — I129 Hypertensive chronic kidney disease with stage 1 through stage 4 chronic kidney disease, or unspecified chronic kidney disease: Secondary | ICD-10-CM | POA: Diagnosis present

## 2017-09-26 DIAGNOSIS — R600 Localized edema: Secondary | ICD-10-CM | POA: Diagnosis not present

## 2017-09-26 DIAGNOSIS — R4701 Aphasia: Secondary | ICD-10-CM | POA: Diagnosis not present

## 2017-09-26 DIAGNOSIS — M810 Age-related osteoporosis without current pathological fracture: Secondary | ICD-10-CM | POA: Diagnosis present

## 2017-09-26 DIAGNOSIS — E785 Hyperlipidemia, unspecified: Secondary | ICD-10-CM | POA: Diagnosis present

## 2017-09-26 DIAGNOSIS — I4891 Unspecified atrial fibrillation: Secondary | ICD-10-CM

## 2017-09-26 DIAGNOSIS — I481 Persistent atrial fibrillation: Principal | ICD-10-CM | POA: Diagnosis present

## 2017-09-26 DIAGNOSIS — G459 Transient cerebral ischemic attack, unspecified: Secondary | ICD-10-CM | POA: Diagnosis not present

## 2017-09-26 DIAGNOSIS — J181 Lobar pneumonia, unspecified organism: Secondary | ICD-10-CM | POA: Diagnosis present

## 2017-09-26 DIAGNOSIS — N183 Chronic kidney disease, stage 3 unspecified: Secondary | ICD-10-CM | POA: Diagnosis present

## 2017-09-26 DIAGNOSIS — K219 Gastro-esophageal reflux disease without esophagitis: Secondary | ICD-10-CM | POA: Diagnosis present

## 2017-09-26 DIAGNOSIS — R51 Headache: Secondary | ICD-10-CM | POA: Diagnosis not present

## 2017-09-26 DIAGNOSIS — R471 Dysarthria and anarthria: Secondary | ICD-10-CM

## 2017-09-26 DIAGNOSIS — M79605 Pain in left leg: Secondary | ICD-10-CM | POA: Diagnosis present

## 2017-09-26 DIAGNOSIS — N3281 Overactive bladder: Secondary | ICD-10-CM | POA: Diagnosis not present

## 2017-09-26 DIAGNOSIS — R479 Unspecified speech disturbances: Secondary | ICD-10-CM

## 2017-09-26 DIAGNOSIS — D638 Anemia in other chronic diseases classified elsewhere: Secondary | ICD-10-CM | POA: Diagnosis present

## 2017-09-26 DIAGNOSIS — I10 Essential (primary) hypertension: Secondary | ICD-10-CM | POA: Diagnosis not present

## 2017-09-26 DIAGNOSIS — I872 Venous insufficiency (chronic) (peripheral): Secondary | ICD-10-CM | POA: Diagnosis present

## 2017-09-26 DIAGNOSIS — F419 Anxiety disorder, unspecified: Secondary | ICD-10-CM | POA: Diagnosis not present

## 2017-09-26 DIAGNOSIS — Z7902 Long term (current) use of antithrombotics/antiplatelets: Secondary | ICD-10-CM

## 2017-09-26 DIAGNOSIS — R402441 Other coma, without documented Glasgow coma scale score, or with partial score reported, in the field [EMT or ambulance]: Secondary | ICD-10-CM | POA: Diagnosis not present

## 2017-09-26 DIAGNOSIS — F329 Major depressive disorder, single episode, unspecified: Secondary | ICD-10-CM | POA: Diagnosis present

## 2017-09-26 DIAGNOSIS — I639 Cerebral infarction, unspecified: Secondary | ICD-10-CM | POA: Diagnosis not present

## 2017-09-26 DIAGNOSIS — R0602 Shortness of breath: Secondary | ICD-10-CM | POA: Diagnosis not present

## 2017-09-26 HISTORY — DX: Unspecified atrial fibrillation: I48.91

## 2017-09-26 LAB — I-STAT CHEM 8, ED
BUN: 27 mg/dL — ABNORMAL HIGH (ref 6–20)
Calcium, Ion: 1.19 mmol/L (ref 1.15–1.40)
Chloride: 104 mmol/L (ref 101–111)
Creatinine, Ser: 1 mg/dL (ref 0.44–1.00)
Glucose, Bld: 101 mg/dL — ABNORMAL HIGH (ref 65–99)
HEMATOCRIT: 32 % — AB (ref 36.0–46.0)
HEMOGLOBIN: 10.9 g/dL — AB (ref 12.0–15.0)
POTASSIUM: 3.7 mmol/L (ref 3.5–5.1)
Sodium: 140 mmol/L (ref 135–145)
TCO2: 22 mmol/L (ref 22–32)

## 2017-09-26 LAB — COMPREHENSIVE METABOLIC PANEL
ALBUMIN: 2.5 g/dL — AB (ref 3.5–5.0)
ALT: 9 U/L — ABNORMAL LOW (ref 14–54)
ANION GAP: 9 (ref 5–15)
AST: 25 U/L (ref 15–41)
Alkaline Phosphatase: 53 U/L (ref 38–126)
BUN: 25 mg/dL — ABNORMAL HIGH (ref 6–20)
CALCIUM: 9 mg/dL (ref 8.9–10.3)
CHLORIDE: 106 mmol/L (ref 101–111)
CO2: 22 mmol/L (ref 22–32)
Creatinine, Ser: 1.17 mg/dL — ABNORMAL HIGH (ref 0.44–1.00)
GFR calc non Af Amer: 41 mL/min — ABNORMAL LOW (ref >60)
GFR, EST AFRICAN AMERICAN: 47 mL/min — AB (ref >60)
GLUCOSE: 102 mg/dL — AB (ref 65–99)
POTASSIUM: 3.6 mmol/L (ref 3.5–5.1)
SODIUM: 137 mmol/L (ref 135–145)
Total Bilirubin: 0.4 mg/dL (ref 0.3–1.2)
Total Protein: 5.9 g/dL — ABNORMAL LOW (ref 6.5–8.1)

## 2017-09-26 LAB — CBC WITH DIFFERENTIAL/PLATELET
BASOS ABS: 0 10*3/uL (ref 0.0–0.1)
Basophils Relative: 0 %
EOS PCT: 1 %
Eosinophils Absolute: 0.1 10*3/uL (ref 0.0–0.7)
HCT: 32.2 % — ABNORMAL LOW (ref 36.0–46.0)
Hemoglobin: 10.5 g/dL — ABNORMAL LOW (ref 12.0–15.0)
LYMPHS PCT: 17 %
Lymphs Abs: 2.1 10*3/uL (ref 0.7–4.0)
MCH: 29.1 pg (ref 26.0–34.0)
MCHC: 32.6 g/dL (ref 30.0–36.0)
MCV: 89.2 fL (ref 78.0–100.0)
MONO ABS: 1.4 10*3/uL — AB (ref 0.1–1.0)
MONOS PCT: 11 %
Neutro Abs: 8.4 10*3/uL — ABNORMAL HIGH (ref 1.7–7.7)
Neutrophils Relative %: 71 %
PLATELETS: 428 10*3/uL — AB (ref 150–400)
RBC: 3.61 MIL/uL — ABNORMAL LOW (ref 3.87–5.11)
RDW: 14.6 % (ref 11.5–15.5)
WBC: 12 10*3/uL — ABNORMAL HIGH (ref 4.0–10.5)

## 2017-09-26 LAB — RETICULOCYTES
RBC.: 3.75 MIL/uL — ABNORMAL LOW (ref 3.87–5.11)
RETIC COUNT ABSOLUTE: 86.3 10*3/uL (ref 19.0–186.0)
Retic Ct Pct: 2.3 % (ref 0.4–3.1)

## 2017-09-26 LAB — I-STAT CG4 LACTIC ACID, ED
LACTIC ACID, VENOUS: 1.01 mmol/L (ref 0.5–1.9)
Lactic Acid, Venous: 1.3 mmol/L (ref 0.5–1.9)

## 2017-09-26 LAB — IRON AND TIBC
IRON: 24 ug/dL — AB (ref 28–170)
Saturation Ratios: 9 % — ABNORMAL LOW (ref 10.4–31.8)
TIBC: 263 ug/dL (ref 250–450)
UIBC: 239 ug/dL

## 2017-09-26 LAB — PROTIME-INR
INR: 1.38
PROTHROMBIN TIME: 16.8 s — AB (ref 11.4–15.2)

## 2017-09-26 LAB — FERRITIN: Ferritin: 79 ng/mL (ref 11–307)

## 2017-09-26 LAB — I-STAT TROPONIN, ED: Troponin i, poc: 0.31 ng/mL (ref 0.00–0.08)

## 2017-09-26 LAB — FOLATE: FOLATE: 78 ng/mL (ref >5.9)

## 2017-09-26 LAB — APTT: APTT: 44 s — AB (ref 24–36)

## 2017-09-26 LAB — ETHANOL: Alcohol, Ethyl (B): <10 mg/dL (ref <10)

## 2017-09-26 MED ORDER — DILTIAZEM HCL 100 MG IV SOLR
5.0000 mg/h | INTRAVENOUS | Status: DC
  Administered 2017-09-26: 15 mg/h via INTRAVENOUS
  Administered 2017-09-27 (×2): 10 mg/h via INTRAVENOUS
  Filled 2017-09-26 (×5): qty 100

## 2017-09-26 MED ORDER — ACETAMINOPHEN 325 MG PO TABS
650.0000 mg | ORAL_TABLET | ORAL | Status: DC | PRN
  Administered 2017-09-28: 650 mg via ORAL
  Filled 2017-09-26: qty 2

## 2017-09-26 MED ORDER — POLYVINYL ALCOHOL 1.4 % OP SOLN: 1.0000 [drp] | Freq: Two times a day (BID) | OPHTHALMIC | Status: DC | PRN

## 2017-09-26 MED ORDER — HEPARIN (PORCINE) IN NACL 100-0.45 UNIT/ML-% IJ SOLN
1050.0000 [IU]/h | INTRAMUSCULAR | Status: DC
  Administered 2017-09-26: 900 [IU]/h via INTRAVENOUS
  Filled 2017-09-26 (×3): qty 250

## 2017-09-26 MED ORDER — PANTOPRAZOLE SODIUM 20 MG PO TBEC
20.0000 mg | DELAYED_RELEASE_TABLET | Freq: Every day | ORAL | Status: DC
  Administered 2017-09-26 – 2017-09-28 (×3): 20 mg via ORAL
  Filled 2017-09-26 (×3): qty 1

## 2017-09-26 MED ORDER — LOSARTAN POTASSIUM 50 MG PO TABS
50.0000 mg | ORAL_TABLET | Freq: Every day | ORAL | Status: DC
  Administered 2017-09-26 – 2017-09-28 (×3): 50 mg via ORAL
  Filled 2017-09-26 (×4): qty 1

## 2017-09-26 MED ORDER — FLUTICASONE PROPIONATE 50 MCG/ACT NA SUSP
1.0000 | Freq: Every day | NASAL | Status: DC
  Administered 2017-09-26 – 2017-09-28 (×3): 1 via NASAL
  Filled 2017-09-26 (×2): qty 16

## 2017-09-26 MED ORDER — HEPARIN BOLUS VIA INFUSION
4000.0000 [IU] | Freq: Once | INTRAVENOUS | Status: AC
  Administered 2017-09-26: 4000 [IU] via INTRAVENOUS
  Filled 2017-09-26: qty 4000

## 2017-09-26 MED ORDER — CARVEDILOL 3.125 MG PO TABS
3.1250 mg | ORAL_TABLET | Freq: Two times a day (BID) | ORAL | Status: DC
  Administered 2017-09-26 – 2017-09-28 (×3): 3.125 mg via ORAL
  Filled 2017-09-26 (×5): qty 1

## 2017-09-26 MED ORDER — FENOFIBRATE 54 MG PO TABS
54.0000 mg | ORAL_TABLET | Freq: Every day | ORAL | Status: DC
Start: 2017-09-26 — End: 2017-09-28
  Administered 2017-09-26 – 2017-09-28 (×3): 54 mg via ORAL
  Filled 2017-09-26 (×3): qty 1

## 2017-09-26 MED ORDER — DOXYCYCLINE HYCLATE 100 MG PO TABS
100.0000 mg | ORAL_TABLET | Freq: Two times a day (BID) | ORAL | Status: DC
  Administered 2017-09-26 – 2017-09-28 (×3): 100 mg via ORAL
  Filled 2017-09-26 (×3): qty 1

## 2017-09-26 MED ORDER — PRESERVISION AREDS PO CAPS: ORAL_CAPSULE | Freq: Three times a day (TID) | ORAL | Status: DC

## 2017-09-26 MED ORDER — OXYBUTYNIN CHLORIDE ER 5 MG PO TB24
5.0000 mg | ORAL_TABLET | Freq: Every day | ORAL | Status: DC
  Administered 2017-09-26 – 2017-09-28 (×3): 5 mg via ORAL
  Filled 2017-09-26 (×3): qty 1

## 2017-09-26 MED ORDER — ONDANSETRON HCL 4 MG/2ML IJ SOLN
4.0000 mg | Freq: Four times a day (QID) | INTRAMUSCULAR | Status: DC | PRN
Start: 2017-09-26 — End: 2017-09-28

## 2017-09-26 MED ORDER — POLYETHYL GLYCOL-PROPYL GLYCOL 0.4-0.3 % OP GEL: Freq: Two times a day (BID) | OPHTHALMIC | Status: DC | PRN

## 2017-09-26 MED ORDER — SODIUM CHLORIDE 0.9% FLUSH
3.0000 mL | Freq: Two times a day (BID) | INTRAVENOUS | Status: DC
  Administered 2017-09-27 (×2): 3 mL via INTRAVENOUS

## 2017-09-26 MED ORDER — DULOXETINE HCL 20 MG PO CPEP
20.0000 mg | ORAL_CAPSULE | Freq: Every day | ORAL | Status: DC
  Administered 2017-09-26 – 2017-09-28 (×3): 20 mg via ORAL
  Filled 2017-09-26 (×4): qty 1

## 2017-09-26 MED ORDER — SODIUM CHLORIDE 0.9 % IV SOLN: 250.0000 mL | INTRAVENOUS | Status: DC | PRN

## 2017-09-26 MED ORDER — SODIUM CHLORIDE 0.9% FLUSH: 3.0000 mL | INTRAVENOUS | Status: DC | PRN

## 2017-09-26 MED ORDER — LORATADINE 10 MG PO TABS
10.0000 mg | ORAL_TABLET | Freq: Every day | ORAL | Status: DC
  Administered 2017-09-26 – 2017-09-28 (×3): 10 mg via ORAL
  Filled 2017-09-26 (×3): qty 1

## 2017-09-26 MED ORDER — FUROSEMIDE 10 MG/ML IJ SOLN
20.0000 mg | Freq: Once | INTRAMUSCULAR | Status: AC
  Administered 2017-09-26: 20 mg via INTRAVENOUS
  Filled 2017-09-26: qty 2

## 2017-09-26 MED ORDER — BENZONATATE 100 MG PO CAPS
100.0000 mg | ORAL_CAPSULE | Freq: Three times a day (TID) | ORAL | Status: DC | PRN
  Administered 2017-09-26: 100 mg via ORAL
  Filled 2017-09-26 (×2): qty 1

## 2017-09-26 MED ORDER — DILTIAZEM LOAD VIA INFUSION
10.0000 mg | Freq: Once | INTRAVENOUS | Status: AC
  Administered 2017-09-26: 10 mg via INTRAVENOUS
  Filled 2017-09-26: qty 10

## 2017-09-26 MED ORDER — PROSIGHT PO TABS
1.0000 | ORAL_TABLET | Freq: Every day | ORAL | Status: DC
  Administered 2017-09-26 – 2017-09-28 (×3): 1 via ORAL
  Filled 2017-09-26 (×3): qty 1

## 2017-09-26 NOTE — Consult Note (Signed)
Cardiology Consultation:   Patient ID: Charlene Wade; [id number]; 03-27-1928   Admit date: 09/26/2017 Date of Consult: 09/26/2017  Primary Care Provider: Lucretia Kern, DO Primary Cardiologist: Dr. Gwenlyn Found Primary Electrophysiologist: NA   Patient Profile:   Charlene Wade is a 83 y.o. female with a hx of right MCA stroke (2013) with mild residual, HTN, lower extremity edema, depression, and recent dx of RLL PNA treated with 10-day course of doxycycline (09/19/17) who is being seen today for the evaluation of new onset atrial fibrillation with RVR at the request of Dr. Wynelle Cleveland.   History of Present Illness:   Charlene Wade is a pleasant 83yo who presents to the ED today for increased confusion and garbled speech per her sister and care taker who are currently at her beside. She has a history of of R MCA stroke in 2013 in which she was seen by Cardiology to perform a TEE. At that time she was noted to be in SR. EF was 60% with no valvular disease. She has no history of CAD in her past, although this may have been the underlying culprit back in 2013.  According to the notes, she was seen by Dr. Gwenlyn Found last year (2017) for increased lower extremity edema, which was contributed to her Amlodipine use and was subsequently discontinued. At the time of her visit, she was her edema was noted to be improved and she was not started on a diuretic. She has a history of hypertension. It appears that she was tried on ACE-I which was not tolerated well and was switched to Losartan. She remains on this. EKG in 2016, NSR 71bmp.   On 11/13 she was taken to a local urgent care in which she was treated for PNA with a course of Doxycycline. She has felt better since then and denies fevers, cough, N/V/D. She reports more fatigue and orthopnea but has contributed this to her recent PNA dx.   In the ED, pt was given bolus of diltiazem and started on gtt at 5mg .hr. Pt HR remains in afib with a rate ranging from  110's-120's. Her head CT scan was non-acute, negative for bleed, OLD MCA infarct and small vessel disease. CXR reveals mild RLL infiltrates with possible effusion. Troponin=0.31, WBC=12, K=3.7, Hb=10.9  Past Medical History:  Diagnosis Date  . Allergic rhinitis   . BCC (basal cell carcinoma of skin) 04/10/2015   sees dermatologist  . Depression 10/17/2012  . Ectopic pregnancy   . GERD (gastroesophageal reflux disease)    hx hiatal hernia, has seen GI, ENT and allergist in the past  . Hyperlipidemia   . Hypertension   . OA (osteoarthritis)    knees and back, hx DDD, s/p remote lumbar disc surgery  . OAB (overactive bladder) 04/10/2015  . Obesity   . Osteoporosis    on prolia in the past  . Stroke [facility])    sees neurologist, hx memory loss, expressive aphasia  . Venous insufficiency     Past Surgical History:  Procedure Laterality Date  . ABDOMINAL HYSTERECTOMY    . APPENDECTOMY    . BREAST REDUCTION SURGERY    . LAPAROSCOPIC SALPINGOOPHERECTOMY    . NASAL MASS EXCISION     Basal Cell Cancer Excision  . NASAL RECONSTRUCTION    . TEE WITHOUT CARDIOVERSION  10/22/2012   Procedure: TRANSESOPHAGEAL ECHOCARDIOGRAM (TEE);  Surgeon: Josue Hector, MD;  Location[facility] ENDOSCOPY;  Service: Cardiovascular;  Laterality: N/A;     Prior to Admission  medications   Medication Sig Start Date End Date Taking? Authorizing Provider  acetaminophen (TYLENOL) 500 MG tablet Take 500 mg by mouth 2 (two) times daily as needed for headache (pain).    Yes [provider]  benzonatate (TESSALON PERLES) 100 MG capsule Take 1 capsule (100 mg total) by mouth 3 (three) times daily as needed. Patient taking differently: Take 100 mg by mouth 3 (three) times daily as needed for cough.  09/22/17  Yes Lucretia Kern, DO  cetirizine (ZYRTEC) 10 MG tablet Take 10 mg by mouth at bedtime.    Yes [provider]  cholecalciferol (VITAMIN D) 1000 units tablet Take 2,000 Units by mouth 2 (two) times daily.    Yes [provider]  clopidogrel (PLAVIX) 75 MG tablet take 1 tablet by mouth once daily with BREAKFAST Patient taking differently: take 1 tablet (75 mg) by mouth once daily with BREAKFAST 01/05/17  Yes Colin Benton R, DO  Cyanocobalamin (VITAMIN B12 PO) Take 1 tablet by mouth daily.    Yes [provider]  denosumab (PROLIA) 60 MG/ML SOLN injection Inject 60 mg into the skin every 6 (six) months. Administer in upper arm, thigh, or abdomen (last injection June or July 2018)   Yes [provider]  diclofenac sodium (VOLTAREN) 1 % GEL Apply 1 application topically 4 (four) times daily as needed (arm pain/bursitis).   Yes [provider]  doxycycline (VIBRA-TABS) 100 MG tablet Take 1 tablet (100 mg total) by mouth 2 (two) times daily. Patient taking differently: Take 100 mg by mouth 2 (two) times daily. 10 day course started 09/20/17 09/20/17  Yes Wendling, Crosby Oyster, DO  DULoxetine (CYMBALTA) 20 MG capsule Take 1 capsule (20 mg total) by mouth daily. 07/08/17  Yes Lucretia Kern, DO  fenofibrate (TRICOR) 145 MG tablet take 1 tablet by mouth once daily Patient taking differently: take 1 tablet (145 mg) by mouth once daily 08/05/17  Yes Lucretia Kern, DO  fluticasone [facility]) 50 MCG/ACT nasal spray instill 1 spray into each nostril twice a day 08/28/17  Yes Colin Benton R, DO  furosemide (LASIX) 20 MG tablet Take 1 tablet (20 mg total) by mouth daily. 01/31/17  Yes Lucretia Kern, DO  losartan (COZAAR) 50 MG tablet take 1 tablet by mouth once daily Patient taking differently: take 1 tablet (50 mg) by mouth once daily 05/22/17  Yes Lucretia Kern, DO  Multiple Vitamins-Minerals (PRESERVISION AREDS PO) Take 1 tablet by mouth 3 (three) times daily.    Yes [provider]  oxybutynin (DITROPAN-XL) 5 MG 24 hr tablet take 1 tablet by mouth once daily Patient taking differently: take 1 tablet (5 mg) by mouth once daily at bedtime 06/16/17  Yes Colin Benton R, DO    pantoprazole (PROTONIX) 20 MG tablet take 1 tablet by mouth once daily Patient taking differently: take 1 tablet (20 mg) by mouth once daily 05/19/17  Yes Colin Benton R, DO  Polyethyl Glycol-Propyl Glycol (SYSTANE OP) Place 1 drop into both eyes 2 (two) times daily as needed (dry eyes).   Yes [provider]    Inpatient Medications: Scheduled Meds: . doxycycline  100 mg Oral BID  . DULoxetine  20 mg Oral Daily  . fenofibrate  54 mg Oral Daily  . fluticasone  1 spray Each Nare Daily  . heparin  4,000 Units Intravenous Once  . loratadine  10 mg Oral Daily  . losartan  50 mg Oral Daily  . oxybutynin  5 mg Oral Daily  . pantoprazole  20 mg Oral Daily  . PRESERVISION AREDS   Oral TID  . sodium chloride flush  3 mL Intravenous Q12H   Continuous Infusions: . sodium chloride    . diltiazem (CARDIZEM) infusion 7.5 mg/hr (09/26/17 1814)  . heparin     PRN Meds:   Allergies:    Allergies  Allergen Reactions  . Levaquin [Levofloxacin] Other (See Comments)    Severe intermittent arm pain  . Ace Inhibitors Cough  . Codeine Other (See Comments)    Unknown reaction - didn't feel well?  . Penicillins Other (See Comments)    Caused high fever Has patient had a PCN reaction causing immediate rash, facial/tongue/throat swelling, SOB or lightheadedness with hypotension: No Has patient had a PCN reaction causing severe rash involving mucus membranes or skin necrosis: No Has patient had a PCN reaction that required hospitalization: No Has patient had a PCN reaction occurring within the last 10 years: No If all of the above answers are "NO", then may proceed with Cephalosporin use.  . Sulfa Antibiotics Other (See Comments)    Possibly caused fever  . Sulfasalazine Other (See Comments)    Possibly caused fever    Social History:   Social History   Socioeconomic History  . Marital status: Widowed    Spouse name: Not on file  . Number of children: 2  . Years of education: 21   . Highest education level: Not on file  Social Needs  . Financial resource strain: Not on file  . Food insecurity - worry: Not on file  . Food insecurity - inability: Not on file  . Transportation needs - medical: Not on file  . Transportation needs - non-medical: Not on file  Occupational History  . Occupation: retired  Tobacco Use  . Smoking status: Former Research scientist (life sciences)  . Smokeless tobacco: Never Used  Substance and Sexual Activity  . Alcohol use: No  . Drug use: No  . Sexual activity: No  Other Topics Concern  . Not on file  Social History Narrative   Patient is single with 2 children.   Patient is right handed.   Patient has 12 th grade education.   Patient does not drink caffeine.    Family History:   Family History  Problem Relation Age of Onset  . Hypertension Father   . Hypertension Mother   . Cancer - Lung Brother    Family Status:  Family Status  Relation Name Status  . Father  Deceased at age 83  . Mother  Deceased at age 95  . Brother  (Not Specified)    ROS:  Please see the history of present illness.  All other ROS reviewed and negative.     Physical Exam/Data:   Vitals:   09/26/17 1441 09/26/17 1600 09/26/17 1730 09/26/17 1800  BP: (!) 161/105 (!) 171/96 (!) 164/119 (!) 162/94  Pulse: 61 (!) 44 (!) 111   Resp: (!) 21 18 (!) 21 (!) 23  Temp: 97.9 F (36.6 C)     TempSrc: Oral     SpO2: 98% 95% 100%    No intake or output data in the 24 hours ending 09/26/17 1838 There were no vitals filed for this visit. There is no height or weight on file to calculate BMI.   General: Well developed, well nourished, NAD Skin: Warm, dry, intact  Head: Normocephalic, atraumatic, clear, moist mucus membranes. Neck: Negative for carotid bruits. No JVD Lungs:Crackles in bilateral  bases. No wheezes. Breathing is unlabored. Cardiovascular: RRR with S1 S2. No murmurs, rubs, gallops, or LV heave appreciated. Abdomen: Soft, non-tender, non-distended with normoactive  bowel sounds.  No rebound/guarding. No obvious abdominal masses. MSK: Strength and tone appear normal for age. 5/5 in all extremities Extremities: 3+ pitting edema BLE.  No clubbing or cyanosis. DP/PT pulses 2+ bilaterally Neuro: Alert and oriented. No focal deficits. No facial asymmetry. MAE spontaneously. Psych: Responds to questions appropriately with normal affect.     EKG:  11/30 The EKG was personally reviewed and demonstrates:  Atrial fibrillation with rapid ventricular response. Rate 136 Telemetry:  11/30 Telemetry was personally reviewed and demonstrates: Afib RVR Rate 110-120's   Relevant CV Studies:  TEE: 09/2012  Study Conclusions  - Left ventricle: The cavity size was normal. Wall thickness was normal. Systolic function was normal. The estimated ejection fraction was in the range of 60% to 65%. - Mitral valve: Thickened with mild diastolic restriction MVA ok by Pt1/2 small gradient Mild regurgitation. - Left atrium: The atrium was moderately dilated. - Right atrium: No evidence of thrombus in the atrial cavity or appendage. - Atrial septum: No defect or patent foramen ovale was identified. Echo contrast study showed no right-to-left atrial level shunt, at baseline or with provocation. Transesophageal echocardiography. 2D and color Doppler. Height: Height: 142.2cm. Height: 56in. Weight: Weight: 68.5kg. Weight: 150.7lb. Body mass index: BMI: 33.9kg/m^2. Body surface area:  BSA: 1.30m^2. Blood pressure: 167/69. Patient status: Outpatient. Location: Endoscopy.  CATH: NA  Laboratory Data:  Chemistry Recent Labs  Lab 09/26/17 1504 09/26/17 1527  NA 137 140  K 3.6 3.7  CL 106 104  CO2 22  --   GLUCOSE 102* 101*  BUN 25* 27*  CREATININE 1.17* 1.00  CALCIUM 9.0  --   GFRNONAA 41*  --   GFRAA 47*  --   ANIONGAP 9  --     Total Protein  Date Value Ref Range Status  09/26/2017 5.9 (L) 6.5 - 8.1 g/dL Final   Albumin  Date Value Ref  Range Status  09/26/2017 2.5 (L) 3.5 - 5.0 g/dL Final   AST  Date Value Ref Range Status  09/26/2017 25 15 - 41 U/L Final   ALT  Date Value Ref Range Status  09/26/2017 9 (L) 14 - 54 U/L Final   Alkaline Phosphatase  Date Value Ref Range Status  09/26/2017 53 38 - 126 U/L Final   Total Bilirubin  Date Value Ref Range Status  09/26/2017 0.4 0.3 - 1.2 mg/dL Final   Hematology Recent Labs  Lab 09/26/17 1504 09/26/17 1527  WBC 12.0*  --   RBC 3.61*  --   HGB 10.5* 10.9*  HCT 32.2* 32.0*  MCV 89.2  --   MCH 29.1  --   MCHC 32.6  --   RDW 14.6  --   PLT 428*  --    Cardiac EnzymesNo results for input(s): TROPONINI in the last 168 hours.  Recent Labs  Lab 09/26/17 1525  TROPIPOC 0.31*    BNPNo results for input(s): BNP, PROBNP in the last 168 hours.  DDimer No results for input(s): DDIMER in the last 168 hours. TSH:  Lab Results  Component Value Date   TSH 1.48 06/02/2015   Lipids: Lab Results  Component Value Date   CHOL 184 11/19/2016   HDL 79.20 11/19/2016   LDLCALC 85 11/19/2016   TRIG 98.0 11/19/2016   CHOLHDL 2 11/19/2016   HgbA1c: Lab Results  Component Value Date  HGBA1C 5.9 04/25/2015    Radiology/Studies:  Ct Head Wo Contrast  Result Date: 09/26/2017 CLINICAL DATA:  History of stroke many years ago. New onset of headaches. EXAM: CT HEAD WITHOUT CONTRAST TECHNIQUE: Contiguous axial images were obtained from the base of the skull through the vertex without intravenous contrast. COMPARISON:  Multiple priors. Most recent MR 10/14/2012. Most recent CT 10/16/2012. FINDINGS: Brain: No evidence for acute stroke, acute hemorrhage, or extra-axial fluid. Advanced atrophy, with hydrocephalus ex vacuo. Calcified or ossified meningioma, LEFT frontal convexity, 14 x 19 x 21 mm, stable. No adjacent vasogenic edema. Extensive hypoattenuation of white matter representing small vessel disease. Moderately large remote RIGHT posterior frontal, posterior temporal, and  parietal cortical and subcortical infarction with gliosis and encephalomalacia. Vascular: Calcification of the cavernous internal carotid arteries and distal vertebral arteries consistent with cerebrovascular atherosclerotic disease. No signs of intracranial large vessel occlusion. Calcific MCA branches in the RIGHT sylvian fissure is stable. Skull: Normal. Negative for fracture or focal lesion. Sinuses/Orbits: No acute finding.  BILATERAL cataract extraction. Other: Similar appearance to priors. IMPRESSION: Atrophy and small vessel disease.  Old RIGHT MCA territory infarct. Chronic calcified or ossified LEFT convexity meningioma, without changes of associated vasogenic edema in the cortex. Calcific cerebrovascular atherosclerotic disease. No acute intracranial findings. Electronically Signed   By: Staci Righter M.D.   On: 09/26/2017 17:00   Dg Chest Portable 1 View  Result Date: 09/26/2017 CLINICAL DATA:  Short of breath EXAM: PORTABLE CHEST 1 VIEW COMPARISON:  01/25/2015 FINDINGS: Cardiac enlargement without heart failure.  Atherosclerotic aorta. Right lower lobe airspace disease with mild progression. Probable atelectasis. Small right effusion. Left lung clear. IMPRESSION: Mild right lower lobe airspace disease and small right effusion. Electronically Signed   By: Franchot Gallo M.D.   On: 09/26/2017 15:30    Assessment and Plan:   1.Atrial fibrillation with rapid ventricular response: -Titrate diltiazem gtt for HR control  -Add Coreg 3.125mg  PO BID for better rate and BP control -Echocardiogram to assess for changes from 2013.  -Consider transitioning to Eliquis in AM given that she was started on Heparin gtt this evening. Head CT negative for acute bleed. PAF possible culprit for her stroke in 2013, although this was never captured on telemetry.  2. Bilateral lower extremity edema -Will give one dose of lasix tonight to help with LE swelling -BUN/Cr=27/1.00 -K=3.7 -Baseline weight per family  is 170lb. Notes from office visit in 2016 are consistent with this.   3. HTN -BP's elevated in the range of 160's/100's -Will add coreg 3.125mg  for greater BP control -One dose lasix given this PM, Consider daily diuretic  -Continue ARB  Principal Problem:   New onset a-fib ([place]) Active Problems:   Essential hypertension   Leg edema   Dysarthria  For questions or updates, please contact [facility] Please consult [email or web address] for contact info under Cardiology/STEMI.   Signed, Kathyrn Drown, NP  09/26/2017 6:38 PM   I have seen and examined the patient along with Kathyrn Drown, NP.  I have reviewed the chart, notes and new data.  I agree with NP's note.  Key new complaints: She is completely unaware of the palpitations and cannot tell when the arrhythmia began.  It seems that she may be describing some mild orthopnea, although she states she just does not feel comfortable lying flat, does not specifically describe dyspnea.  She has chronic lower extremity edema and reports that this is only slightly worse than usual.  Does not have chest discomfort/pain.  Key examination changes: Rapid irregular rhythm but otherwise normal cardiac exam, , 3+ soft pitting symmetrical lower extremity edema with varicose veins. Key new findings / data: ECG shows atrial fibrillation rapid ventricular response, no ischemic repolarization abnormalities.  Normocytic normochromic mild anemia.  Initial creatinine level was reportedly 1.17, check just 20 minutes later was normal at 1.00.  Nominal elevation in cardiac troponin I 0.31.  PLAN: Atrial fibrillation is the most likely culprit for her current neurological event and the remote stroke, although this is the first time the arrhythmia has been positively identified.  Rate control with intravenous diltiazem has been initiated and will try to transition her to oral medications as soon as possible.  Echo in 2013 showed normal EF.  This should be repeated, but  suspect we will not see major structural changes. CHADSVasc 6-7 (age 42, CVA 2, gender, hypertension, +/- heart failure).  Very high risk for recurrent embolic event.  She is elderly, but weighs over 150 pounds and has normal renal function.  Recommend Eliquis 5 mg twice daily. Suspect she has diastolic heart failure and that symptoms will improve with rate control.  If symptoms are readily managed with rate control alone, there is little incentive to pursue return to sinus rhythm in this elderly woman that is unaware of palpitations.  Sanda Klein, MD, [place] [phone] 09/26/2017, 7:23 PM

## 2017-09-26 NOTE — Progress Notes (Signed)
ANTICOAGULATION CONSULT NOTE - Initial Consult  Pharmacy Consult for heparin Indication: atrial fibrillation  Allergies  Allergen Reactions  . Levaquin [Levofloxacin] Other (See Comments)    Severe intermittent arm pain  . Ace Inhibitors Cough  . Codeine Other (See Comments)    Unknown reaction - didn't feel well?  . Penicillins Other (See Comments)    Caused high fever Has patient had a PCN reaction causing immediate rash, facial/tongue/throat swelling, SOB or lightheadedness with hypotension: No Has patient had a PCN reaction causing severe rash involving mucus membranes or skin necrosis: No Has patient had a PCN reaction that required hospitalization: No Has patient had a PCN reaction occurring within the last 10 years: No If all of the above answers are "NO", then may proceed with Cephalosporin use.  . Sulfa Antibiotics Other (See Comments)    Possibly caused fever  . Sulfasalazine Other (See Comments)    Possibly caused fever    Patient Measurements:   Heparin Dosing Weight: 63 kg   Vital Signs: Temp: 97.9 F (36.6 C) (11/30 1441) Temp Source: Oral (11/30 1441) BP: 161/105 (11/30 1441) Pulse Rate: 61 (11/30 1441)  Labs: Recent Labs    09/26/17 1504 09/26/17 1527  HGB 10.5* 10.9*  HCT 32.2* 32.0*  PLT 428*  --   APTT 44*  --   LABPROT 16.8*  --   INR 1.38  --   CREATININE 1.17* 1.00    Estimated Creatinine Clearance: 32.9 mL/min (by C-G formula based on SCr of 1 mg/dL).   Medical History: Past Medical History:  Diagnosis Date  . Allergic rhinitis   . BCC (basal cell carcinoma of skin) 04/10/2015   sees dermatologist  . Depression 10/17/2012  . Ectopic pregnancy   . GERD (gastroesophageal reflux disease)    hx hiatal hernia, has seen GI, ENT and allergist in the past  . Hyperlipidemia   . Hypertension   . OA (osteoarthritis)    knees and back, hx DDD, s/p remote lumbar disc surgery  . OAB (overactive bladder) 04/10/2015  . Obesity   . Osteoporosis     on prolia in the past  . Stroke [facility])    sees neurologist, hx memory loss, expressive aphasia  . Venous insufficiency     Medications:   (Not in a hospital admission)  Assessment: 33 YOF who presented to the ED with worsening aphasia and confusion. She was found to have new onset A.fib with RVR and pharmacy was consulted to start IV heparin. H/H low, Plt 428k. Pt is not on any anticoagulation at home   Goal of Therapy:  Heparin level 0.3-0.7 units/ml Monitor platelets by anticoagulation protocol: Yes   Plan:  -Heparin 4000 units IV followed by heparin infusion at 900 units/hr -F/u 8 hr HL -Monitor daily HL, CBC and s/s of bleeding  Albertina Parr, PharmD., BCPS Clinical Pharmacist Pager [phone]

## 2017-09-26 NOTE — ED Triage Notes (Signed)
Patient from home where she was found to be aphasic by a visiting home health aid. Per EMS, the aid thought the patient was having a hard time talking but later when the patient was in the shower, the patient called the aid for help at which point the aid realized the patient was having difficulty "getting her words out". Patient has been undergoing treatment for pneumonia for the last week. No difficulty breathing. Currently mildly aphasic. Heart rate irregular with rate between 130-150.

## 2017-09-26 NOTE — Consult Note (Signed)
Neurology Consultation Reason for Consult: Difficulty speaking Referring Physician: Wynelle Cleveland, S  CC: Occultly speaking  History is obtained from: Patient  HPI: Charlene Wade is a 83 y.o. female who was in her normal state of health last night.  She woke up this morning, and thinks that she was normal, but did not really speak much until she was in the shower and it was too hot.  She tried to say that she was having difficulty and it was too hot, but the speech coming out was not understandable.  She states that she was saying incorrect words and that she was not understandable.  She did not have any difficulty understanding others.  She currently feels that she is completely resolved and back to baseline.  She denies numbness/weakness/visual change.  No antecedent symptoms.  She has been slightly short of breath, and having new pain in her left leg.  In the emergency department she was noted to be in atrial fibrillation and has been started on both diltiazem and heparin.  LKW: 11/29 prior to bed tpa given?: no, out of window, resolved symptoms.   ROS: A 14 point ROS was performed and is negative except as noted in the HPI.  Past Medical History:  Diagnosis Date  . Allergic rhinitis   . BCC (basal cell carcinoma of skin) 04/10/2015   sees dermatologist  . Depression 10/17/2012  . Ectopic pregnancy   . GERD (gastroesophageal reflux disease)    hx hiatal hernia, has seen GI, ENT and allergist in the past  . Hyperlipidemia   . Hypertension   . OA (osteoarthritis)    knees and back, hx DDD, s/p remote lumbar disc surgery  . OAB (overactive bladder) 04/10/2015  . Obesity   . Osteoporosis    on prolia in the past  . Stroke [facility])    sees neurologist, hx memory loss, expressive aphasia  . Venous insufficiency      Family History  Problem Relation Age of Onset  . Hypertension Father   . Hypertension Mother   . Cancer - Lung Brother      Social History:  reports that she has  quit smoking. she has never used smokeless tobacco. She reports that she does not drink alcohol or use drugs.   Exam: Current vital signs: BP (!) 155/121 (BP Location: Left Arm)   Pulse (!) 122   Temp 97.9 F (36.6 C) (Oral)   Resp 20   SpO2 100%  Vital signs in last 24 hours: Temp:  [97.9 F (36.6 C)] 97.9 F (36.6 C) (11/30 1441) Pulse Rate:  [44-122] 122 (11/30 1921) Resp:  [18-23] 20 (11/30 1921) BP: (155-171)/(94-121) 155/121 (11/30 1921) SpO2:  [95 %-100 %] 100 % (11/30 1921)   Physical Exam  Constitutional: Appears well-developed and well-nourished.  Psych: Affect appropriate to situation Eyes: No scleral injection HENT: No OP obstrucion Head: Normocephalic.  Cardiovascular: Normal rate and regular rhythm.  Respiratory: Effort normal and breath sounds normal to anterior ascultation GI: Soft.  No distension. There is no tenderness.  Skin: WDI  Neuro: Mental Status: Patient is awake, alert, oriented to person, place, month, year, and situation. Patient is able to give a clear and coherent history. No signs of aphasia or neglect.  She is able to repeat. Cranial Nerves: II: Visual Fields are full. Pupils are equal, round, and reactive to light.   III,IV, VI: EOMI without ptosis or diploplia.  V: Facial sensation is symmetric to temperature VII: Facial movement is symmetric.  VIII: hearing is intact to voice X: Uvula elevates symmetrically XI: Shoulder shrug is symmetric. XII: tongue is midline without atrophy or fasciculations.  Motor: Tone is normal. Bulk is normal. 5/5 strength was present in bilateral arms, bilateral legs are limited due to pain, but she is able to hold both without drift. Sensory: Sensation is symmetric to light touch and temperature in the arms and legs. Cerebellar: Finger-nose-finger without ataxia bilaterally, but she does not perform heel-knee-shin due to pain.  I have reviewed labs in epic and the results pertinent to this consultation  are: CMP-unremarkable  I have reviewed the images obtained: CT head-unremarkable  Impression: 83 year old female with transient aphasia in the setting of new onset atrial fibrillation.  I do suspect that this was transient ischemic attack.  She is on the heparin, and I agree with starting this, but I would favor getting MRI prior to starting longer term anticoagulation.  Recommendations: 1. HgbA1c, fasting lipid panel 2. MRI, MRA  of the brain without contrast 3. Frequent neuro checks 4. Echocardiogram 5. Carotid dopplers 6. Prophylactic therapy-anticoagulation 7. Risk factor modification 8. Telemetry monitoring 9. PT consult, OT consult, Speech consult 10. please page stroke NP  Or  PA  Or MD  from 8am -4 pm as this patient will be followed by the stroke team at this point.   You can look them up on [email or web address]     Roland Rack, MD [facility] [phone]  If 7pm- 7am, please page neurology on call as listed in [place].

## 2017-09-26 NOTE — ED Notes (Signed)
Patient aware of need for urine sample.  Attempted on bedside commode without success

## 2017-09-26 NOTE — H&P (Signed)
History and Physical    Charlene Wade GUR:[id number] DOB: 08/15/1928 DOA: 09/26/2017    PCP: Lucretia Kern, DO  Patient coming from: home  Chief Complaint: trouble speaking  HPI: Charlene Wade is a 83 y.o. female with medical history of chronic pain, depression, HTN and HLD who has been on treatment for a suspected RLL pneumonia as outpt with Doxycycline. She is brought in by her caretaker for on and off difficulty with speaking for the past two days. The patient was having trouble organizing her words. There was not slurred speech. In the ER, she is found to have A-fib with RVR and after Cardizem infusion has been started, she has regained her ability to speak correctly. The patient states that she has had no other neurological issues such as change in vision, swallowing, numbness or tingling, focal weakness or headaches. She states her cough is improved and is now not longer productive. She has had some mild dyspnea on exertion. No chest pain or palpitations.   ED Course:  Trop 0.31 WBC 12 CXR- mild RLL infiltrate and possible effusion Currently on Cardizem infusion at 5 mg/hr  Review of Systems:  + for b/l pedal edema which was been worse lately Poor appetite for the past week All other systems reviewed and apart from HPI, are negative.  Past Medical History:  Diagnosis Date  . Allergic rhinitis   . BCC (basal cell carcinoma of skin) 04/10/2015   sees dermatologist  . Depression 10/17/2012  . Ectopic pregnancy   . GERD (gastroesophageal reflux disease)    hx hiatal hernia, has seen GI, ENT and allergist in the past  . Hyperlipidemia   . Hypertension   . OA (osteoarthritis)    knees and back, hx DDD, s/p remote lumbar disc surgery  . OAB (overactive bladder) 04/10/2015  . Obesity   . Osteoporosis    on prolia in the past  . Stroke [facility])    sees neurologist, hx memory loss, expressive aphasia  . Venous insufficiency     Past Surgical History:  Procedure Laterality  Date  . ABDOMINAL HYSTERECTOMY    . APPENDECTOMY    . BREAST REDUCTION SURGERY    . LAPAROSCOPIC SALPINGOOPHERECTOMY    . NASAL MASS EXCISION     Basal Cell Cancer Excision  . NASAL RECONSTRUCTION    . TEE WITHOUT CARDIOVERSION  10/22/2012   Procedure: TRANSESOPHAGEAL ECHOCARDIOGRAM (TEE);  Surgeon: Josue Hector, MD;  Location: [facility] ENDOSCOPY;  Service: Cardiovascular;  Laterality: N/A;    Social History:   reports that she has quit smoking in 1980 but her husband smoked cigars around her. She has never used smokeless tobacco. She reports that she does not drink alcohol or use drugs. Usually uses a walker. Has a caregiver during the daytime.   Allergies  Allergen Reactions  . Levaquin [Levofloxacin] Other (See Comments)    Severe intermittent arm pain  . Ace Inhibitors Cough  . Codeine Other (See Comments)    Unknown reaction - didn't feel well?  . Penicillins Other (See Comments)    Caused high fever Has patient had a PCN reaction causing immediate rash, facial/tongue/throat swelling, SOB or lightheadedness with hypotension: No Has patient had a PCN reaction causing severe rash involving mucus membranes or skin necrosis: No Has patient had a PCN reaction that required hospitalization: No Has patient had a PCN reaction occurring within the last 10 years: No If all of the above answers are "NO", then may proceed with Cephalosporin  use.  . Sulfa Antibiotics Other (See Comments)    Possibly caused fever  . Sulfasalazine Other (See Comments)    Possibly caused fever    Family History  Problem Relation Age of Onset  . Hypertension Father   . Hypertension Mother   . Cancer - Lung Brother      Prior to Admission medications   Medication Sig Start Date End Date Taking? Authorizing Provider  acetaminophen (TYLENOL) 500 MG tablet Take 500 mg by mouth 2 (two) times daily as needed for headache (pain).    Yes [provider]  benzonatate (TESSALON PERLES) 100 MG capsule  Take 1 capsule (100 mg total) by mouth 3 (three) times daily as needed. Patient taking differently: Take 100 mg by mouth 3 (three) times daily as needed for cough.  09/22/17  Yes Lucretia Kern, DO  cetirizine (ZYRTEC) 10 MG tablet Take 10 mg by mouth at bedtime.    Yes [provider]  cholecalciferol (VITAMIN D) 1000 units tablet Take 2,000 Units by mouth 2 (two) times daily.   Yes [provider]  clopidogrel (PLAVIX) 75 MG tablet take 1 tablet by mouth once daily with BREAKFAST Patient taking differently: take 1 tablet (75 mg) by mouth once daily with BREAKFAST 01/05/17  Yes Colin Benton R, DO  Cyanocobalamin (VITAMIN B12 PO) Take 1 tablet by mouth daily.    Yes [provider]  denosumab (PROLIA) 60 MG/ML SOLN injection Inject 60 mg into the skin every 6 (six) months. Administer in upper arm, thigh, or abdomen (last injection June or July 2018)   Yes [provider]  diclofenac sodium (VOLTAREN) 1 % GEL Apply 1 application topically 4 (four) times daily as needed (arm pain/bursitis).   Yes [provider]  doxycycline (VIBRA-TABS) 100 MG tablet Take 1 tablet (100 mg total) by mouth 2 (two) times daily. Patient taking differently: Take 100 mg by mouth 2 (two) times daily. 10 day course started 09/20/17 09/20/17  Yes Wendling, Crosby Oyster, DO  DULoxetine (CYMBALTA) 20 MG capsule Take 1 capsule (20 mg total) by mouth daily. 07/08/17  Yes Lucretia Kern, DO  fenofibrate (TRICOR) 145 MG tablet take 1 tablet by mouth once daily Patient taking differently: take 1 tablet (145 mg) by mouth once daily 08/05/17  Yes Lucretia Kern, DO  fluticasone [facility]) 50 MCG/ACT nasal spray instill 1 spray into each nostril twice a day 08/28/17  Yes Colin Benton R, DO  furosemide (LASIX) 20 MG tablet Take 1 tablet (20 mg total) by mouth daily. 01/31/17  Yes Lucretia Kern, DO  losartan (COZAAR) 50 MG tablet take 1 tablet by mouth once daily Patient taking differently: take 1 tablet  (50 mg) by mouth once daily 05/22/17  Yes Lucretia Kern, DO  Multiple Vitamins-Minerals (PRESERVISION AREDS PO) Take 1 tablet by mouth 3 (three) times daily.    Yes [provider]  oxybutynin (DITROPAN-XL) 5 MG 24 hr tablet take 1 tablet by mouth once daily Patient taking differently: take 1 tablet (5 mg) by mouth once daily at bedtime 06/16/17  Yes Colin Benton R, DO  pantoprazole (PROTONIX) 20 MG tablet take 1 tablet by mouth once daily Patient taking differently: take 1 tablet (20 mg) by mouth once daily 05/19/17  Yes Colin Benton R, DO  Polyethyl Glycol-Propyl Glycol (SYSTANE OP) Place 1 drop into both eyes 2 (two) times daily as needed (dry eyes).   Yes [provider]    Physical Exam: Wt Readings  from Last 3 Encounters:  09/20/17 77.1 kg (170 lb)  09/02/17 76.1 kg (167 lb 11.2 oz)  07/15/17 75.5 kg (166 lb 8 oz)   Vitals:   09/26/17 1441 09/26/17 1600 09/26/17 1730 09/26/17 1800  BP: (!) 161/105 (!) 171/96 (!) 164/119 (!) 162/94  Pulse: 61 (!) 44 (!) 111   Resp: (!) 21 18 (!) 21 (!) 23  Temp: 97.9 F (36.6 C)     TempSrc: Oral     SpO2: 98% 95% 100%       Constitutional: NAD, calm, comfortable Eyes: PERTLA, lids and conjunctivae normal ENMT: Mucous membranes are moist. Posterior pharynx clear of any exudate or lesions. Normal dentition.  Neck: normal, supple, no masses, no thyromegaly Respiratory: clear to auscultation bilaterally, no wheezing, no crackles. Normal respiratory effort. No accessory muscle use.  Cardiovascular: S1 & S2 heard, IIRR- HR in 100-120s no murmurs / rubs / gallops. 2 + pitting lower extremity edema. 2+ pedal pulses. No carotid bruits.  Abdomen: No distension, no tenderness, no masses palpated. No hepatosplenomegaly. Bowel sounds normal.  Musculoskeletal: no clubbing / cyanosis. No joint deformity upper and lower extremities. Good ROM, no contractures. Normal muscle tone.  Skin: no rashes, lesions, ulcers. No induration Neurologic: CN  2-12 grossly intact. Sensation intact, DTR normal. Strength 5/5 in all 4 limbs.  Psychiatric: Normal judgment and insight. Alert and oriented x 3. Normal mood.     Labs on Admission: I have personally reviewed following labs and imaging studies  CBC: Recent Labs  Lab 09/26/17 1504 09/26/17 1527  WBC 12.0*  --   NEUTROABS 8.4*  --   HGB 10.5* 10.9*  HCT 32.2* 32.0*  MCV 89.2  --   PLT 428*  --    Basic Metabolic Panel: Recent Labs  Lab 09/26/17 1504 09/26/17 1527  NA 137 140  K 3.6 3.7  CL 106 104  CO2 22  --   GLUCOSE 102* 101*  BUN 25* 27*  CREATININE 1.17* 1.00  CALCIUM 9.0  --    GFR: Estimated Creatinine Clearance: 32.9 mL/min (by C-G formula based on SCr of 1 mg/dL). Liver Function Tests: Recent Labs  Lab 09/26/17 1504  AST 25  ALT 9*  ALKPHOS 53  BILITOT 0.4  PROT 5.9*  ALBUMIN 2.5*   No results for input(s): LIPASE, AMYLASE in the last 168 hours. No results for input(s): AMMONIA in the last 168 hours. Coagulation Profile: Recent Labs  Lab 09/26/17 1504  INR 1.38   Cardiac Enzymes: No results for input(s): CKTOTAL, CKMB, CKMBINDEX, TROPONINI in the last 168 hours. BNP (last 3 results) No results for input(s): PROBNP in the last 8760 hours. HbA1C: No results for input(s): HGBA1C in the last 72 hours. CBG: No results for input(s): GLUCAP in the last 168 hours. Lipid Profile: No results for input(s): CHOL, HDL, LDLCALC, TRIG, CHOLHDL, LDLDIRECT in the last 72 hours. Thyroid Function Tests: No results for input(s): TSH, T4TOTAL, FREET4, T3FREE, THYROIDAB in the last 72 hours. Anemia Panel: No results for input(s): VITAMINB12, FOLATE, FERRITIN, TIBC, IRON, RETICCTPCT in the last 72 hours. Urine analysis:    Component Value Date/Time   COLORURINE YELLOW 10/16/2012 2210   APPEARANCEUR CLEAR 10/16/2012 2210   LABSPEC 1.012 10/16/2012 2210   PHURINE 6.5 10/16/2012 2210   GLUCOSEU NEGATIVE 10/16/2012 2210   HGBUR NEGATIVE 10/16/2012 2210    BILIRUBINUR NEGATIVE 10/16/2012 2210   KETONESUR NEGATIVE 10/16/2012 2210   PROTEINUR NEGATIVE 10/16/2012 2210   UROBILINOGEN 0.2 10/16/2012 2210  NITRITE NEGATIVE 10/16/2012 2210   LEUKOCYTESUR TRACE (A) 10/16/2012 2210   Sepsis Labs: @LABRCNTIP (procalcitonin:4,lacticidven:4) )No results found for this or any previous visit (from the past 240 hour(s)).   Radiological Exams on Admission: Ct Head Wo Contrast  Result Date: 09/26/2017 CLINICAL DATA:  History of stroke many years ago. New onset of headaches. EXAM: CT HEAD WITHOUT CONTRAST TECHNIQUE: Contiguous axial images were obtained from the base of the skull through the vertex without intravenous contrast. COMPARISON:  Multiple priors. Most recent MR 10/14/2012. Most recent CT 10/16/2012. FINDINGS: Brain: No evidence for acute stroke, acute hemorrhage, or extra-axial fluid. Advanced atrophy, with hydrocephalus ex vacuo. Calcified or ossified meningioma, LEFT frontal convexity, 14 x 19 x 21 mm, stable. No adjacent vasogenic edema. Extensive hypoattenuation of white matter representing small vessel disease. Moderately large remote RIGHT posterior frontal, posterior temporal, and parietal cortical and subcortical infarction with gliosis and encephalomalacia. Vascular: Calcification of the cavernous internal carotid arteries and distal vertebral arteries consistent with cerebrovascular atherosclerotic disease. No signs of intracranial large vessel occlusion. Calcific MCA branches in the RIGHT sylvian fissure is stable. Skull: Normal. Negative for fracture or focal lesion. Sinuses/Orbits: No acute finding.  BILATERAL cataract extraction. Other: Similar appearance to priors. IMPRESSION: Atrophy and small vessel disease.  Old RIGHT MCA territory infarct. Chronic calcified or ossified LEFT convexity meningioma, without changes of associated vasogenic edema in the cortex. Calcific cerebrovascular atherosclerotic disease. No acute intracranial findings.  Electronically Signed   By: Staci Righter M.D.   On: 09/26/2017 17:00   Dg Chest Portable 1 View  Result Date: 09/26/2017 CLINICAL DATA:  Short of breath EXAM: PORTABLE CHEST 1 VIEW COMPARISON:  01/25/2015 FINDINGS: Cardiac enlargement without heart failure.  Atherosclerotic aorta. Right lower lobe airspace disease with mild progression. Probable atelectasis. Small right effusion. Left lung clear. IMPRESSION: Mild right lower lobe airspace disease and small right effusion. Electronically Signed   By: Franchot Gallo M.D.   On: 09/26/2017 15:30    EKG: Independently reviewed. A-fib at 132 Bpm  Assessment/Plan Principal Problem:   New onset a-fib  - has been started on Cardizem and Heparin infusions - f/u ECHO and TSH - cardiology consulted by ER (Dr Sallyanne Kuster) - appreciate their assitance  Active Problems: aphasia - h/o CVA in the past - likely due to rapid HR and now improved as HR is better controlled - CT head negative - check carotid duplex, fasting lipids and A1c - Neuro has been consulted - cont Plavix which she takes at home  Recent pneumonia - cont Doxycyline which was started on 11/24  Mildly elevated troponin - possibly cardiac strain     Essential hypertension - cont Losartan and Cardizem    Leg edema - chronic issue- on Lasix and uses TEDS- will elevated  Urinary urgency and incontinence - Ditropan  Anemia - check anemia panel in AM  DVT prophylaxis: heparin infusion  Code Status: full code  Family Communication: sister  Disposition Plan: SDU  Consults called: Cardiology and Neurology called by ER  Admission status: inpatient    Debbe Odea MD [facility] Pager: [email or web address] Password [email or web address] 7PM-7AM, please contact night-coverage   09/26/2017, 6:19 PM

## 2017-09-26 NOTE — ED Provider Notes (Signed)
Emergency Department Provider Note   I have reviewed the triage vital signs and the nursing notes.   HISTORY  Chief Complaint Altered Mental Status   HPI Charlene Wade is a 83 y.o. female with PMH of GERD, HTN, HLD, and prior CVA presents to the emergency department for evaluation of worsening aphasia and confusion.  Patient has reportedly had mild aphasic symptoms which have been waxing and waning over the past 2 days.  Today her home health aide was assisting her in the shower and the patient had significant worsening word finding difficulty and slurred speech.  Patient continues to have some of this but reports its improved slightly.  She denies any unilateral numbness or weakness.  No severe headache.  Patient is currently being treated as an outpatient for pneumonia diagnosed by the primary care physician.  She was initially placed on azithromycin and then changed to doxycycline yesterday.  Patient denies any chest pain or difficulty breathing.  She has no known history of atrial fibrillation. Healthy denies any fevers.  Patient denies dysuria, hesitancy, urgency.   Past Medical History:  Diagnosis Date  . Allergic rhinitis   . BCC (basal cell carcinoma of skin) 04/10/2015   sees dermatologist  . Depression 10/17/2012  . Ectopic pregnancy   . GERD (gastroesophageal reflux disease)    hx hiatal hernia, has seen GI, ENT and allergist in the past  . Hyperlipidemia   . Hypertension   . OA (osteoarthritis)    knees and back, hx DDD, s/p remote lumbar disc surgery  . OAB (overactive bladder) 04/10/2015  . Obesity   . Osteoporosis    on prolia in the past  . Stroke [facility])    sees neurologist, hx memory loss, expressive aphasia  . Venous insufficiency     Patient Active Problem List   Diagnosis Date Noted  . New onset a-fib ([place]) 09/26/2017  . Dysarthria 09/26/2017  . Recurrent major depressive disorder, in partial remission ([place]) 09/02/2017  . Other chronic pain  09/02/2017  . Class 2 obesity due to excess calories with serious comorbidity and body mass index (BMI) of 38.0 to 38.9 in adult 07/08/2017  . Vitamin D deficiency 06/03/2016  . CKD (chronic kidney disease) stage 3, GFR 30-59 ml/min (HCC) 04/26/2015  . GERD (gastroesophageal reflux disease) 04/10/2015  . Mood disorder ([place]) 04/10/2015  . Osteoporosis 04/10/2015  . Leg edema 04/10/2015  . OAB (overactive bladder) 04/10/2015  . Osteoarthritis 04/10/2015  . Allergic rhinitis 04/10/2015  . Hyperlipidemia 10/13/2012  . H/O: CVA (cerebrovascular accident) 10/12/2012  . Essential hypertension 10/12/2012    Past Surgical History:  Procedure Laterality Date  . ABDOMINAL HYSTERECTOMY    . APPENDECTOMY    . BREAST REDUCTION SURGERY    . LAPAROSCOPIC SALPINGOOPHERECTOMY    . NASAL MASS EXCISION     Basal Cell Cancer Excision  . NASAL RECONSTRUCTION    . TEE WITHOUT CARDIOVERSION  10/22/2012   Procedure: TRANSESOPHAGEAL ECHOCARDIOGRAM (TEE);  Surgeon: Josue Hector, MD;  Location: [facility] ENDOSCOPY;  Service: Cardiovascular;  Laterality: N/A;    Current Outpatient Rx  . Order #: [id number] Class: Historical Med  . Order #: [id number] Class: Normal  . Order #: [id number] Class: Historical Med  . Order #: [id number] Class: Historical Med  . Order #: [id number] Class: Normal  . Order #: [id number] Class: Historical Med  . Order #: [id number] Class: Historical Med  . Order #: [id number] Class: Historical Med  . Order #: [id number] Class: Normal  . Order #: [id number] Class: Normal  . Order #:  [id number] Class: Normal  . Order #: [id number] Class: Normal  . Order #: [id number] Class: Normal  . Order #: [id number] Class: Normal  . Order #: [id number] Class: Historical Med  . Order #: [id number] Class: Normal  . Order #: [id number] Class: Normal  . Order #: [id number] Class: Historical Med    Allergies Levaquin [levofloxacin]; Ace inhibitors; Codeine; Penicillins; Sulfa antibiotics; and Sulfasalazine  Family History  Problem  Relation Age of Onset  . Hypertension Father   . Hypertension Mother   . Cancer - Lung Brother     Social History Social History   Tobacco Use  . Smoking status: Former Research scientist (life sciences)  . Smokeless tobacco: Never Used  Substance Use Topics  . Alcohol use: No  . Drug use: No    Review of Systems  Constitutional: No fever/chills Eyes: No visual changes. ENT: No sore throat. Cardiovascular: Denies chest pain. Respiratory: Denies shortness of breath. Gastrointestinal: No abdominal pain.  No nausea, no vomiting.  No diarrhea.  No constipation. Genitourinary: Negative for dysuria. Musculoskeletal: Negative for back pain. Skin: Negative for rash. Neurological: Negative for headaches, focal weakness or numbness. Positive aphasia and confusion.   10-point ROS otherwise negative.  ____________________________________________   PHYSICAL EXAM:  VITAL SIGNS: ED Triage Vitals  Enc Vitals Group     BP 09/26/17 1437 (!) 161/105     Pulse Rate 09/26/17 1441 61     Resp 09/26/17 1441 (!) 21     Temp 09/26/17 1441 97.9 F (36.6 C)     Temp Source 09/26/17 1441 Oral     SpO2 09/26/17 1441 98 %    Constitutional: Alert and oriented. Appears uncomfortable but in no acute distress.  Eyes: Conjunctivae are normal.  Head: Atraumatic. Nose: No congestion/rhinnorhea. Mouth/Throat: Mucous membranes are dry.   Neck: No stridor.  Cardiovascular: A-fib with RVR. Good peripheral circulation. Grossly normal heart sounds.   Respiratory: Normal respiratory effort.  No retractions. Lungs CTAB. Gastrointestinal: Soft and nontender. No distention.  Musculoskeletal: No lower extremity tenderness with 2+ edema. No gross deformities of extremities. Neurologic:  Normal speech and language. No gross focal neurologic deficits are appreciated.  Skin:  Skin is warm, dry and intact. No rash noted.  ____________________________________________   LABS (all labs ordered are listed, but only abnormal results  are displayed)  Labs Reviewed  PROTIME-INR - Abnormal; Notable for the following components:      Result Value   Prothrombin Time 16.8 (*)    All other components within normal limits  APTT - Abnormal; Notable for the following components:   aPTT 44 (*)    All other components within normal limits  COMPREHENSIVE METABOLIC PANEL - Abnormal; Notable for the following components:   Glucose, Bld 102 (*)    BUN 25 (*)    Creatinine, Ser 1.17 (*)    Total Protein 5.9 (*)    Albumin 2.5 (*)    ALT 9 (*)    GFR calc non Af Amer 41 (*)    GFR calc Af Amer 47 (*)    All other components within normal limits  URINALYSIS, ROUTINE W REFLEX MICROSCOPIC - Abnormal; Notable for the following components:   APPearance HAZY (*)    Protein, ur 100 (*)    Bacteria, UA RARE (*)    Squamous Epithelial / LPF 0-5 (*)    All other components within normal limits  CBC WITH DIFFERENTIAL/PLATELET - Abnormal; Notable for the following components:   WBC 12.0 (*)    RBC 3.61 (*)    Hemoglobin 10.5 (*)  HCT 32.2 (*)    Platelets 428 (*)    Neutro Abs 8.4 (*)    Monocytes Absolute 1.4 (*)    All other components within normal limits  IRON AND TIBC - Abnormal; Notable for the following components:   Iron 24 (*)    Saturation Ratios 9 (*)    All other components within normal limits  RETICULOCYTES - Abnormal; Notable for the following components:   RBC. 3.75 (*)    All other components within normal limits  I-STAT CHEM 8, ED - Abnormal; Notable for the following components:   BUN 27 (*)    Glucose, Bld 101 (*)    Hemoglobin 10.9 (*)    HCT 32.0 (*)    All other components within normal limits  I-STAT TROPONIN, ED - Abnormal; Notable for the following components:   Troponin i, poc 0.31 (*)    All other components within normal limits  RAPID URINE DRUG SCREEN, HOSP PERFORMED  ETHANOL  FOLATE  FERRITIN  DIFFERENTIAL  HEPARIN LEVEL (UNFRACTIONATED)  CBC  BASIC METABOLIC PANEL  LIPID PANEL  TSH    HEMOGLOBIN A1C  VITAMIN B12  I-STAT CG4 LACTIC ACID, ED  I-STAT TROPONIN, ED  I-STAT CG4 LACTIC ACID, ED   ____________________________________________  EKG   EKG Interpretation  Date/Time:  Friday September 26 2017 14:38:52 EST Ventricular Rate:  136 PR Interval:    QRS Duration: 89 QT Interval:  317 QTC Calculation: 477 R Axis:   64 Text Interpretation:  Atrial fibrillation with rapid V-rate Abnormal ekg Confirmed by Carmin Muskrat [phone]) on 09/26/2017 2:44:02 PM       ____________________________________________  RADIOLOGY  Ct Head Wo Contrast  Result Date: 09/26/2017 CLINICAL DATA:  History of stroke many years ago. New onset of headaches. EXAM: CT HEAD WITHOUT CONTRAST TECHNIQUE: Contiguous axial images were obtained from the base of the skull through the vertex without intravenous contrast. COMPARISON:  Multiple priors. Most recent MR 10/14/2012. Most recent CT 10/16/2012. FINDINGS: Brain: No evidence for acute stroke, acute hemorrhage, or extra-axial fluid. Advanced atrophy, with hydrocephalus ex vacuo. Calcified or ossified meningioma, LEFT frontal convexity, 14 x 19 x 21 mm, stable. No adjacent vasogenic edema. Extensive hypoattenuation of white matter representing small vessel disease. Moderately large remote RIGHT posterior frontal, posterior temporal, and parietal cortical and subcortical infarction with gliosis and encephalomalacia. Vascular: Calcification of the cavernous internal carotid arteries and distal vertebral arteries consistent with cerebrovascular atherosclerotic disease. No signs of intracranial large vessel occlusion. Calcific MCA branches in the RIGHT sylvian fissure is stable. Skull: Normal. Negative for fracture or focal lesion. Sinuses/Orbits: No acute finding.  BILATERAL cataract extraction. Other: Similar appearance to priors. IMPRESSION: Atrophy and small vessel disease.  Old RIGHT MCA territory infarct. Chronic calcified or ossified LEFT convexity  meningioma, without changes of associated vasogenic edema in the cortex. Calcific cerebrovascular atherosclerotic disease. No acute intracranial findings. Electronically Signed   By: Staci Righter M.D.   On: 09/26/2017 17:00   Dg Chest Portable 1 View  Result Date: 09/26/2017 CLINICAL DATA:  Short of breath EXAM: PORTABLE CHEST 1 VIEW COMPARISON:  01/25/2015 FINDINGS: Cardiac enlargement without heart failure.  Atherosclerotic aorta. Right lower lobe airspace disease with mild progression. Probable atelectasis. Small right effusion. Left lung clear. IMPRESSION: Mild right lower lobe airspace disease and small right effusion. Electronically Signed   By: Franchot Gallo M.D.   On: 09/26/2017 15:30    ____________________________________________   PROCEDURES  Procedure(s) performed:   Procedures  CRITICAL CARE  Performed by: Margette Fast Total critical care time: 45 minutes Critical care time was exclusive of separately billable procedures and treating other patients. Critical care was necessary to treat or prevent imminent or life-threatening deterioration. Critical care was time spent personally by me on the following activities: development of treatment plan with patient and/or surrogate as well as nursing, discussions with consultants, evaluation of patient's response to treatment, examination of patient, obtaining history from patient or surrogate, ordering and performing treatments and interventions, ordering and review of laboratory studies, ordering and review of radiographic studies, pulse oximetry and re-evaluation of patient's condition.  Nanda Quinton, MD Emergency Medicine  ____________________________________________   INITIAL IMPRESSION / ASSESSMENT AND PLAN / ED COURSE  Pertinent labs & imaging results that were available during my care of the patient were reviewed by me and considered in my medical decision making (see chart for details).  Patient presents to the  emergency department for evaluation of waxing and waning aphasia and word finding difficulty worsening today but present over the past 2 days.  Patient appears to be in atrial fibrillation with RVR.  This is new for her.  She is not anticoagulated.  Also being treated for pneumonia as an outpatient.  Differential remains broad but my concern for acute stroke is elevated.  Plan for CT head, labs, diltiazem infusion to control heart rate.  Will also evaluate for underlying infectious process that could be driving the patient's new onset A. fib RVR which may be precipitating strokelike symptoms.  Spoke with Dr. Malen Gauze regarding the patient. They will consult but advise hospitalist admit. CT head pending.   Discussed patient's case with Hospitalist to request admission. Patient and family (if present) updated with plan. Care transferred to Hospitalist service.  I reviewed all nursing notes, vitals, pertinent old records, EKGs, labs, imaging (as available).  ____________________________________________  FINAL CLINICAL IMPRESSION(S) / ED DIAGNOSES  Final diagnoses:  Atrial fibrillation with RVR (HCC)  Speech disturbance, unspecified type     MEDICATIONS GIVEN DURING THIS VISIT:  Medications  diltiazem (CARDIZEM) 1 mg/mL load via infusion 10 mg (10 mg Intravenous Bolus from Bag 09/26/17 1535)    And  diltiazem (CARDIZEM) 100 mg in dextrose 5 % 100 mL (1 mg/mL) infusion (15 mg/hr Intravenous New Bag/Given 09/26/17 2350)  heparin ADULT infusion 100 units/mL (25000 units/2102mL sodium chloride 0.45%) (900 Units/hr Intravenous New Bag/Given 09/26/17 1857)  acetaminophen (TYLENOL) tablet 650 mg (not administered)  ondansetron (ZOFRAN) injection 4 mg (not administered)  sodium chloride flush (NS) 0.9 % injection 3 mL (3 mLs Intravenous Not Given 09/26/17 2126)  sodium chloride flush (NS) 0.9 % injection 3 mL (not administered)  0.9 %  sodium chloride infusion (not administered)  benzonatate  (TESSALON) capsule 100 mg (100 mg Oral Given 09/26/17 2353)  loratadine (CLARITIN) tablet 10 mg (10 mg Oral Given 09/26/17 2125)  doxycycline (VIBRA-TABS) tablet 100 mg (100 mg Oral Given 09/26/17 2106)  DULoxetine (CYMBALTA) DR capsule 20 mg (20 mg Oral Given 09/26/17 2117)  fenofibrate tablet 54 mg (54 mg Oral Given 09/26/17 2116)  fluticasone (FLONASE) 50 MCG/ACT nasal spray 1 spray (1 spray Each Nare Given 09/26/17 2353)  losartan (COZAAR) tablet 50 mg (50 mg Oral Given 09/26/17 2116)  oxybutynin (DITROPAN-XL) 24 hr tablet 5 mg (5 mg Oral Given 09/26/17 2116)  pantoprazole (PROTONIX) EC tablet 20 mg (20 mg Oral Given 09/26/17 2114)  carvedilol (COREG) tablet 3.125 mg (3.125 mg Oral Given 09/26/17 2114)  multivitamin (PROSIGHT) tablet 1 tablet (1 tablet  Oral Given 09/26/17 2125)  polyvinyl alcohol (LIQUIFILM TEARS) 1.4 % ophthalmic solution 1 drop (not administered)  heparin bolus via infusion 4,000 Units (4,000 Units Intravenous Bolus from Bag 09/26/17 1909)  furosemide (LASIX) injection 20 mg (20 mg Intravenous Given 09/26/17 2117)    Note:  This document was prepared using Dragon voice recognition software and may include unintentional dictation errors.  Nanda Quinton, MD Emergency Medicine    Long, [facility], MD 09/27/17 [phone]

## 2017-09-26 NOTE — ED Notes (Signed)
IV site assessed and found to be pink and warm to touch but has effortless blood return and flushes well. Pt has no complaints of pain or discomfort at this time. Medication administration resumed.

## 2017-09-26 NOTE — Telephone Encounter (Signed)
  Reason for Disposition . [1] Loss of speech or garbled speech AND [2] sudden onset AND [3] present now  Answer Assessment - Initial Assessment Questions 1. SYMPTOM: "What is the main symptom you are concerned about?" (e.g., weakness, numbness)     Slurred speech, difficulty understanding words used by pt 2. ONSET: "When did this start?" (minutes, hours, days; while sleeping)     Today 3. LAST NORMAL: "When was the last time you were normal (no symptoms)?"     Unsure, caregiver states she believes she was like this yesterday 4. PATTERN "Does this come and go, or has it been constant since it started?"  "Is it present now?"     Constant 5. CARDIAC SYMPTOMS: "Have you had any of the following symptoms: chest pain, difficulty breathing, palpitations?"     Unable to assess 6. NEUROLOGIC SYMPTOMS: "Have you had any of the following symptoms: headache, dizziness, vision loss, double vision, changes in speech, unsteady on your feet?"    Changes in speech 7. OTHER SYMPTOMS: "Do you have any other symptoms?"     Not following commands, difficult speech  Protocols used: NEUROLOGIC DEFICIT-A-AH  Sabas Sous, pt's caregiver calling stating that the pt is not acting like her self. States pt is having some slurred speech with difficulty understanding her words. Pt not following commands at this time and is currently walking around the house naked not wanting to put on a brief. Advised caregiver to call EMS to have further evaluation.

## 2017-09-27 ENCOUNTER — Inpatient Hospital Stay (HOSPITAL_COMMUNITY): Payer: Medicare Other

## 2017-09-27 DIAGNOSIS — F419 Anxiety disorder, unspecified: Secondary | ICD-10-CM

## 2017-09-27 DIAGNOSIS — I4891 Unspecified atrial fibrillation: Secondary | ICD-10-CM

## 2017-09-27 DIAGNOSIS — R471 Dysarthria and anarthria: Secondary | ICD-10-CM

## 2017-09-27 LAB — VITAMIN B12: VITAMIN B 12: 2063 pg/mL — AB (ref 180–914)

## 2017-09-27 LAB — LIPID PANEL
Cholesterol: 103 mg/dL (ref 0–200)
HDL: 37 mg/dL — ABNORMAL LOW
LDL Cholesterol: 41 mg/dL (ref 0–99)
Total CHOL/HDL Ratio: 2.8 ratio
Triglycerides: 124 mg/dL
VLDL: 25 mg/dL (ref 0–40)

## 2017-09-27 LAB — URINALYSIS, ROUTINE W REFLEX MICROSCOPIC
Bilirubin Urine: NEGATIVE
GLUCOSE, UA: NEGATIVE mg/dL
Hgb urine dipstick: NEGATIVE
KETONES UR: NEGATIVE mg/dL
Leukocytes, UA: NEGATIVE
Nitrite: NEGATIVE
PROTEIN: 100 mg/dL — AB
Specific Gravity, Urine: 1.013 (ref 1.005–1.030)
pH: 5 (ref 5.0–8.0)

## 2017-09-27 LAB — CBC
HCT: 33 % — ABNORMAL LOW (ref 36.0–46.0)
HEMOGLOBIN: 10.7 g/dL — AB (ref 12.0–15.0)
MCH: 28.6 pg (ref 26.0–34.0)
MCHC: 32.4 g/dL (ref 30.0–36.0)
MCV: 88.2 fL (ref 78.0–100.0)
Platelets: 413 10*3/uL — ABNORMAL HIGH (ref 150–400)
RBC: 3.74 MIL/uL — ABNORMAL LOW (ref 3.87–5.11)
RDW: 14.3 % (ref 11.5–15.5)
WBC: 11.4 10*3/uL — AB (ref 4.0–10.5)

## 2017-09-27 LAB — BASIC METABOLIC PANEL WITH GFR
Anion gap: 10 (ref 5–15)
BUN: 27 mg/dL — ABNORMAL HIGH (ref 6–20)
CO2: 21 mmol/L — ABNORMAL LOW (ref 22–32)
Calcium: 8.7 mg/dL — ABNORMAL LOW (ref 8.9–10.3)
Chloride: 105 mmol/L (ref 101–111)
Creatinine, Ser: 1.05 mg/dL — ABNORMAL HIGH (ref 0.44–1.00)
GFR calc Af Amer: 54 mL/min — ABNORMAL LOW
GFR calc non Af Amer: 46 mL/min — ABNORMAL LOW
Glucose, Bld: 117 mg/dL — ABNORMAL HIGH (ref 65–99)
Potassium: 3.6 mmol/L (ref 3.5–5.1)
Sodium: 136 mmol/L (ref 135–145)

## 2017-09-27 LAB — ECHOCARDIOGRAM COMPLETE

## 2017-09-27 LAB — HEMOGLOBIN A1C
Hgb A1c MFr Bld: 5.9 % — ABNORMAL HIGH (ref 4.8–5.6)
Mean Plasma Glucose: 122.63 mg/dL

## 2017-09-27 LAB — HEPARIN LEVEL (UNFRACTIONATED)
HEPARIN UNFRACTIONATED: 0.17 [IU]/mL — AB (ref 0.30–0.70)
HEPARIN UNFRACTIONATED: 0.64 [IU]/mL (ref 0.30–0.70)

## 2017-09-27 LAB — RAPID URINE DRUG SCREEN, HOSP PERFORMED
Amphetamines: NOT DETECTED
BARBITURATES: NOT DETECTED
BENZODIAZEPINES: NOT DETECTED
Cocaine: NOT DETECTED
OPIATES: NOT DETECTED
TETRAHYDROCANNABINOL: NOT DETECTED

## 2017-09-27 LAB — TSH: TSH: 0.438 u[IU]/mL (ref 0.350–4.500)

## 2017-09-27 LAB — MRSA PCR SCREENING: MRSA BY PCR: NEGATIVE

## 2017-09-27 MED ORDER — HEPARIN BOLUS VIA INFUSION
1000.0000 [IU] | Freq: Once | INTRAVENOUS | Status: AC
  Administered 2017-09-27: 1000 [IU] via INTRAVENOUS
  Filled 2017-09-27: qty 1000

## 2017-09-27 MED ORDER — FLUCONAZOLE 150 MG PO TABS
150.0000 mg | ORAL_TABLET | Freq: Once | ORAL | Status: AC
  Administered 2017-09-28: 150 mg via ORAL
  Filled 2017-09-27: qty 1

## 2017-09-27 MED ORDER — HEPARIN (PORCINE) IN NACL 100-0.45 UNIT/ML-% IJ SOLN
1150.0000 [IU]/h | INTRAMUSCULAR | Status: DC
  Administered 2017-09-27: 1250 [IU]/h via INTRAVENOUS
  Filled 2017-09-27: qty 250

## 2017-09-27 MED ORDER — LORAZEPAM 2 MG/ML IJ SOLN
0.5000 mg | Freq: Once | INTRAMUSCULAR | Status: AC
  Administered 2017-09-27: 0.5 mg via INTRAVENOUS
  Filled 2017-09-27: qty 1

## 2017-09-27 MED ORDER — ORAL CARE MOUTH RINSE
15.0000 mL | Freq: Two times a day (BID) | OROMUCOSAL | Status: DC
  Administered 2017-09-27: 15 mL via OROMUCOSAL

## 2017-09-27 NOTE — Progress Notes (Signed)
PROGRESS NOTE    Charlene Wade   XBJ:[id number]  DOB: [birth year]  DOA: 09/26/2017 PCP: Lucretia Kern, DO   Brief Narrative:  Charlene Wade is a 83 y.o. female with medical history of chronic pain, depression, HTN and HLD who has been on treatment for a suspected RLL pneumonia as outpt with Doxycycline. She is brought in by her caretaker for on and off difficulty with speaking for the past two days. The patient was having trouble organizing her words. There was not slurred speech. In the ER, she is found to have A-fib with RVR and after Cardizem infusion has been started, she has regained her ability to speak correctly. The patient states that she has had no other neurological issues such as change in vision, swallowing, numbness or tingling, focal weakness or headaches. She states her cough is improved and is now not longer productive. She has had some mild dyspnea on exertion. No chest pain or palpitations.    Subjective: Sleeping after Ativan   Assessment & Plan:  Principal Problem:   New onset a-fib  - has been started on Cardizem and Heparin infusions and Coreg -  TSH normal - ECHO noted below - cardiology consulted by ER (Dr Sallyanne Kuster) - appreciate their assitance  Active Problems: aphasia - h/o CVA in the past - likely due to rapid HR and now improved as HR is better controlled - CT head negative - check carotid duplex, fasting lipids and A1c - Neuro has been consulted - cont Plavix which she takes at home - Neuro work up- MRI >> Severely motion degraded MRI brain exam, of marginal diagnostic utility. No convincing areas of acute infarction are observed. See discussion above.  Recent pneumonia - cont Doxycyline which was started on 11/24  Mildly elevated troponin - possibly cardiac strain     Essential hypertension - cont Losartan, Coreg and Cardizem    Leg edema - chronic issue- on Lasix and uses TEDS- will elevated  Urinary urgency and  incontinence - Ditropan  Anemia - anemia panel consistent with AOCD  DVT prophylaxis: Heparin infusion Code Status: Full code Family Communication: sister Disposition Plan: follow in SDU Consultants:   Cardiology   Neuro  Procedures:   2 D ECHO Left ventricle: The cavity size was normal. Wall thickness was   increased in a pattern of mild LVH. Systolic function was normal.   The estimated ejection fraction was in the range of 60% to 65%.   Wall motion was normal; there were no regional wall motion   abnormalities. The study is not technically sufficient to allow   evaluation of LV diastolic function. - Mitral valve: Severely calcified annulus. Mildly thickened   leaflets . There was mild regurgitation. - Left atrium: The atrium was normal in size. Volume/bsa, ES,   (1-plane Simpson&'s, A2C): 24.3 ml/m^2. Antimicrobials:  Anti-infectives (From admission, onward)   Start     Dose/Rate Route Frequency Ordered Stop   09/26/17 2030  doxycycline (VIBRA-TABS) tablet 100 mg    Comments:  Patient taking differently: 10 day course started 09/20/17     100 mg Oral 2 times daily 09/26/17 1812         Objective: Vitals:   09/27/17 1100 09/27/17 1116 09/27/17 1200 09/27/17 1443  BP: 115/60 115/60 134/83 (!) 148/72  Pulse: 80 82 60 98  Resp: (!) 21 20 17  (!) 22  Temp:      TempSrc:      SpO2: 100% 100% 96% 92%  Intake/Output Summary (Last 24 hours) at 09/27/2017 1522 Last data filed at 09/27/2017 1232 Gross per 24 hour  Intake 174.15 ml  Output 700 ml  Net -525.85 ml   There were no vitals filed for this visit.  Examination: General exam: Appears comfortable  HEENT: PERRLA, oral mucosa moist, no sclera icterus or thrush Respiratory system: Clear to auscultation. Respiratory effort normal. Cardiovascular system: S1 & S2 heard, RRR.  No murmurs  Gastrointestinal system: Abdomen soft, non-tender, nondistended. Normal bowel sound. No organomegaly Central nervous system:    No focal neurological deficits. Extremities: No cyanosis, clubbing or edema Skin: No rashes or ulcers      Data Reviewed: I have personally reviewed following labs and imaging studies  CBC: Recent Labs  Lab 09/26/17 1504 09/26/17 1527 09/27/17 0151  WBC 12.0*  --  11.4*  NEUTROABS 8.4*  --   --   HGB 10.5* 10.9* 10.7*  HCT 32.2* 32.0* 33.0*  MCV 89.2  --  88.2  PLT 428*  --  638*   Basic Metabolic Panel: Recent Labs  Lab 09/26/17 1504 09/26/17 1527 09/27/17 0151  NA 137 140 136  K 3.6 3.7 3.6  CL 106 104 105  CO2 22  --  21*  GLUCOSE 102* 101* 117*  BUN 25* 27* 27*  CREATININE 1.17* 1.00 1.05*  CALCIUM 9.0  --  8.7*   GFR: Estimated Creatinine Clearance: 31.3 mL/min (A) (by C-G formula based on SCr of 1.05 mg/dL (H)). Liver Function Tests: Recent Labs  Lab 09/26/17 1504  AST 25  ALT 9*  ALKPHOS 53  BILITOT 0.4  PROT 5.9*  ALBUMIN 2.5*   No results for input(s): LIPASE, AMYLASE in the last 168 hours. No results for input(s): AMMONIA in the last 168 hours. Coagulation Profile: Recent Labs  Lab 09/26/17 1504  INR 1.38   Cardiac Enzymes: No results for input(s): CKTOTAL, CKMB, CKMBINDEX, TROPONINI in the last 168 hours. BNP (last 3 results) No results for input(s): PROBNP in the last 8760 hours. HbA1C: Recent Labs    09/27/17 0151  HGBA1C 5.9*   CBG: No results for input(s): GLUCAP in the last 168 hours. Lipid Profile: Recent Labs    09/27/17 0151  CHOL 103  HDL 37*  LDLCALC 41  TRIG 124  CHOLHDL 2.8   Thyroid Function Tests: Recent Labs    09/27/17 0151  TSH 0.438   Anemia Panel: Recent Labs    09/26/17 1918 09/27/17 0151  VITAMINB12  --  2,063*  FOLATE 78.0  --   FERRITIN 79  --   TIBC 263  --   IRON 24*  --   RETICCTPCT 2.3  --    Urine analysis:    Component Value Date/Time   COLORURINE YELLOW 09/26/2017 2351   APPEARANCEUR HAZY (A) 09/26/2017 2351   LABSPEC 1.013 09/26/2017 2351   PHURINE 5.0 09/26/2017 2351    GLUCOSEU NEGATIVE 09/26/2017 2351   HGBUR NEGATIVE 09/26/2017 2351   BILIRUBINUR NEGATIVE 09/26/2017 2351   KETONESUR NEGATIVE 09/26/2017 2351   PROTEINUR 100 (A) 09/26/2017 2351   UROBILINOGEN 0.2 10/16/2012 2210   NITRITE NEGATIVE 09/26/2017 2351   LEUKOCYTESUR NEGATIVE 09/26/2017 2351   Sepsis Labs: @LABRCNTIP (procalcitonin:4,lacticidven:4) )No results found for this or any previous visit (from the past 240 hour(s)).       Radiology Studies: Ct Head Wo Contrast  Result Date: 09/26/2017 CLINICAL DATA:  History of stroke many years ago. New onset of headaches. EXAM: CT HEAD WITHOUT CONTRAST TECHNIQUE: Contiguous axial images  were obtained from the base of the skull through the vertex without intravenous contrast. COMPARISON:  Multiple priors. Most recent MR 10/14/2012. Most recent CT 10/16/2012. FINDINGS: Brain: No evidence for acute stroke, acute hemorrhage, or extra-axial fluid. Advanced atrophy, with hydrocephalus ex vacuo. Calcified or ossified meningioma, LEFT frontal convexity, 14 x 19 x 21 mm, stable. No adjacent vasogenic edema. Extensive hypoattenuation of white matter representing small vessel disease. Moderately large remote RIGHT posterior frontal, posterior temporal, and parietal cortical and subcortical infarction with gliosis and encephalomalacia. Vascular: Calcification of the cavernous internal carotid arteries and distal vertebral arteries consistent with cerebrovascular atherosclerotic disease. No signs of intracranial large vessel occlusion. Calcific MCA branches in the RIGHT sylvian fissure is stable. Skull: Normal. Negative for fracture or focal lesion. Sinuses/Orbits: No acute finding.  BILATERAL cataract extraction. Other: Similar appearance to priors. IMPRESSION: Atrophy and small vessel disease.  Old RIGHT MCA territory infarct. Chronic calcified or ossified LEFT convexity meningioma, without changes of associated vasogenic edema in the cortex. Calcific cerebrovascular  atherosclerotic disease. No acute intracranial findings. Electronically Signed   By: Staci Righter M.D.   On: 09/26/2017 17:00   Mr Virgel Paling GX Contrast  Result Date: 09/27/2017 CLINICAL DATA:  Transient aphasia in the setting of new onset atrial fibrillation. Suspected transient ischemic attack. EXAM: MRI HEAD WITHOUT CONTRAST MRA HEAD WITHOUT CONTRAST TECHNIQUE: Multiplanar, multiecho pulse sequences of the brain and surrounding structures were obtained without intravenous contrast. Angiographic images of the head were obtained using MRA technique without contrast. COMPARISON:  CT head 09/26/2017. FINDINGS: There is severe motion degradation. The study is of marginal diagnostic utility. MRI HEAD FINDINGS Brain: No convincing areas of restricted diffusion are observed. Due to motion degradation, there is insufficient correlation between the axial and coronal DWI sequences and their corresponding ADC maps. There is advanced atrophy with chronic microvascular ischemic change. A chronic calcified or ossified 2 cm LEFT frontal meningioma is redemonstrated, without adjacent edema. Vascular: Flow voids are maintained. Skull and upper cervical spine: Not assessed. Sinuses/Orbits: No visible layering fluid. BILATERAL cataract extraction. Other: None. MRA HEAD FINDINGS Patency of the internal carotid arteries is established. Patency of the basilar artery is established with BILATERAL vertebral contribution, greater on the LEFT. Both middle cerebral arteries demonstrate flow related enhancement. IMPRESSION: Severely motion degraded MRI brain exam, of marginal diagnostic utility. No convincing areas of acute infarction are observed. See discussion above. Severe motion degradation of the MRA sequences as well. Gross patency of the carotid, vertebral, and basilar arteries is established. Electronically Signed   By: Staci Righter M.D.   On: 09/27/2017 15:02   Mr Brain Wo Contrast  Result Date: 09/27/2017 CLINICAL DATA:   Transient aphasia in the setting of new onset atrial fibrillation. Suspected transient ischemic attack. EXAM: MRI HEAD WITHOUT CONTRAST MRA HEAD WITHOUT CONTRAST TECHNIQUE: Multiplanar, multiecho pulse sequences of the brain and surrounding structures were obtained without intravenous contrast. Angiographic images of the head were obtained using MRA technique without contrast. COMPARISON:  CT head 09/26/2017. FINDINGS: There is severe motion degradation. The study is of marginal diagnostic utility. MRI HEAD FINDINGS Brain: No convincing areas of restricted diffusion are observed. Due to motion degradation, there is insufficient correlation between the axial and coronal DWI sequences and their corresponding ADC maps. There is advanced atrophy with chronic microvascular ischemic change. A chronic calcified or ossified 2 cm LEFT frontal meningioma is redemonstrated, without adjacent edema. Vascular: Flow voids are maintained. Skull and upper cervical spine: Not assessed. Sinuses/Orbits: No visible  layering fluid. BILATERAL cataract extraction. Other: None. MRA HEAD FINDINGS Patency of the internal carotid arteries is established. Patency of the basilar artery is established with BILATERAL vertebral contribution, greater on the LEFT. Both middle cerebral arteries demonstrate flow related enhancement. IMPRESSION: Severely motion degraded MRI brain exam, of marginal diagnostic utility. No convincing areas of acute infarction are observed. See discussion above. Severe motion degradation of the MRA sequences as well. Gross patency of the carotid, vertebral, and basilar arteries is established. Electronically Signed   By: Staci Righter M.D.   On: 09/27/2017 15:02   Dg Chest Portable 1 View  Result Date: 09/26/2017 CLINICAL DATA:  Short of breath EXAM: PORTABLE CHEST 1 VIEW COMPARISON:  01/25/2015 FINDINGS: Cardiac enlargement without heart failure.  Atherosclerotic aorta. Right lower lobe airspace disease with mild  progression. Probable atelectasis. Small right effusion. Left lung clear. IMPRESSION: Mild right lower lobe airspace disease and small right effusion. Electronically Signed   By: Franchot Gallo M.D.   On: 09/26/2017 15:30      Scheduled Meds: . carvedilol  3.125 mg Oral BID WC  . doxycycline  100 mg Oral BID  . DULoxetine  20 mg Oral Daily  . fenofibrate  54 mg Oral Daily  . fluticasone  1 spray Each Nare Daily  . loratadine  10 mg Oral Daily  . losartan  50 mg Oral Daily  . multivitamin  1 tablet Oral Daily  . oxybutynin  5 mg Oral Daily  . pantoprazole  20 mg Oral Daily  . sodium chloride flush  3 mL Intravenous Q12H   Continuous Infusions: . sodium chloride    . diltiazem (CARDIZEM) infusion 10 mg/hr (09/27/17 0536)  . heparin 1,250 Units/hr (09/27/17 1232)     LOS: 1 day    Time spent in minutes: [place], MD [facility] Pager: [email or web address] Password [email or web address] 09/27/2017, 3:22 PM

## 2017-09-27 NOTE — Progress Notes (Signed)
ANTICOAGULATION CONSULT NOTE  Pharmacy Consult:  Heparin Indication: atrial fibrillation  Allergies  Allergen Reactions  . Levaquin [Levofloxacin] Other (See Comments)    Severe intermittent arm pain  . Ace Inhibitors Cough  . Codeine Other (See Comments)    Unknown reaction - didn't feel well?  . Penicillins Other (See Comments)    Caused high fever Has patient had a PCN reaction causing immediate rash, facial/tongue/throat swelling, SOB or lightheadedness with hypotension: No Has patient had a PCN reaction causing severe rash involving mucus membranes or skin necrosis: No Has patient had a PCN reaction that required hospitalization: No Has patient had a PCN reaction occurring within the last 10 years: No If all of the above answers are "NO", then may proceed with Cephalosporin use.  . Sulfa Antibiotics Other (See Comments)    Possibly caused fever  . Sulfasalazine Other (See Comments)    Possibly caused fever    Patient Measurements: Heparin Dosing Weight: 63 kg   Vital Signs: BP: 115/60 (12/01 1116) Pulse Rate: 82 (12/01 1116)  Labs: Recent Labs    09/26/17 1504 09/26/17 1527 09/27/17 0151 09/27/17 1102  HGB 10.5* 10.9* 10.7*  --   HCT 32.2* 32.0* 33.0*  --   PLT 428*  --  413*  --   APTT 44*  --   --   --   LABPROT 16.8*  --   --   --   INR 1.38  --   --   --   HEPARINUNFRC  --   --  <0.10* 0.17*  CREATININE 1.17* 1.00 1.05*  --     Estimated Creatinine Clearance: 31.3 mL/min (A) (by C-G formula based on SCr of 1.05 mg/dL (H)).   Assessment: 25 YOF who presented to the ED with worsening aphasia and confusion, and possible TIA. She was found to have new onset A.fib with RVR and pharmacy was consulted to manage IV heparin.  Heparin level is sub-therapeutic; no bleeding reported.  No issue with heparin infusion per RN.   Goal of Therapy:  Heparin level 0.3-0.5 units/mL Monitor platelets by anticoagulation protocol: Yes    Plan:  Increase heparin gtt to  1250 units/hr Check 8 hr heparin level Daily heparin level and CBC F/u ECHO, stopping doxycycline since patient completed treatment course   Sherle Mello D. Mina Marble, PharmD, BCPS Pager:  [phone] 09/27/2017, 12:17 PM

## 2017-09-27 NOTE — ED Notes (Signed)
Patient transported to MRI with RN 

## 2017-09-27 NOTE — Progress Notes (Signed)
ANTICOAGULATION CONSULT NOTE  Pharmacy Consult:  Heparin Indication: atrial fibrillation  Allergies  Allergen Reactions  . Levaquin [Levofloxacin] Other (See Comments)    Severe intermittent arm pain  . Ace Inhibitors Cough  . Codeine Other (See Comments)    Unknown reaction - didn't feel well?  . Penicillins Other (See Comments)    Caused high fever Has patient had a PCN reaction causing immediate rash, facial/tongue/throat swelling, SOB or lightheadedness with hypotension: No Has patient had a PCN reaction causing severe rash involving mucus membranes or skin necrosis: No Has patient had a PCN reaction that required hospitalization: No Has patient had a PCN reaction occurring within the last 10 years: No If all of the above answers are "NO", then may proceed with Cephalosporin use.  . Sulfa Antibiotics Other (See Comments)    Possibly caused fever  . Sulfasalazine Other (See Comments)    Possibly caused fever    Patient Measurements: Heparin Dosing Weight: 63 kg   Vital Signs: Temp: 97.7 F (36.5 C) (12/01 2000) Temp Source: Axillary (12/01 2000) BP: 102/83 (12/01 2000) Pulse Rate: 79 (12/01 2000)  Labs: Recent Labs    09/26/17 1504 09/26/17 1527 09/27/17 0151 09/27/17 1102 09/27/17 2058  HGB 10.5* 10.9* 10.7*  --   --   HCT 32.2* 32.0* 33.0*  --   --   PLT 428*  --  413*  --   --   APTT 44*  --   --   --   --   LABPROT 16.8*  --   --   --   --   INR 1.38  --   --   --   --   HEPARINUNFRC  --   --  <0.10* 0.17* 0.64  CREATININE 1.17* 1.00 1.05*  --   --     Estimated Creatinine Clearance: 30.6 mL/min (A) (by C-G formula based on SCr of 1.05 mg/dL (H)).   Assessment: 62 YOF who presented to the ED with worsening aphasia and confusion, and possible TIA. She was found to have new onset A.fib with RVR and pharmacy was consulted to manage IV heparin.   Heparin level is slightly subtherapeutic at 0.64 (goal 0.3-0.5) after most recent rate change.    Goal of  Therapy:  Heparin level 0.3-0.5 units/mL Monitor platelets by anticoagulation protocol: Yes    Plan:  1. Decrease heparin infusion to 1150 units/hr 2. Repeat heparin level in 8 hours   Vincenza Hews, PharmD, BCPS 09/27/2017, 9:50 PM

## 2017-09-27 NOTE — Progress Notes (Signed)
*  PRELIMINARY RESULTS* Echocardiogram 2D Echocardiogram has been performed.  Leavy Cella 09/27/2017, 11:10 AM

## 2017-09-27 NOTE — ED Notes (Signed)
SWAT RN Melina will stay with pt in MRI, Report given to RN

## 2017-09-27 NOTE — Progress Notes (Signed)
STROKE TEAM PROGRESS NOTE   HISTORY OF PRESENT ILLNESS (per record) Charlene Wade is a 83 y.o. female who was in her normal state of health last night.  She woke up this morning, and thinks that she was normal, but did not really speak much until she was in the shower and it was too hot.  She tried to say that she was having difficulty and it was too hot, but the speech coming out was not understandable.  She states that she was saying incorrect words and that she was not understandable.  She did not have any difficulty understanding others.  She currently feels that she is completely resolved and back to baseline.  She denies numbness/weakness/visual change.  No antecedent symptoms.  She has been slightly short of breath, and having new pain in her left leg.  In the emergency department she was noted to be in atrial fibrillation and has been started on both diltiazem and heparin.  LKW: 11/29 prior to bed tpa given?: no, out of window, resolved symptoms.     SUBJECTIVE (INTERVAL HISTORY) No family members present. Dr. Leonie Man discussed the patient's case and answered questions. An MRI of the brain is planned. The patient requested anxiety medication for claustrophobia..   OBJECTIVE Temp:  [97 F (36.1 C)] 97 F (36.1 C) (12/01 1634) Pulse Rate:  [60-165] 89 (12/01 1634) Cardiac Rhythm: Atrial fibrillation;Other (Comment) (12/01 1620) Resp:  [14-28] 14 (12/01 1634) BP: (98-164)/(53-121) 132/73 (12/01 1634) SpO2:  [89 %-100 %] 89 % (12/01 1634)  CBC:  Recent Labs  Lab 09/26/17 1504 09/26/17 1527 09/27/17 0151  WBC 12.0*  --  11.4*  NEUTROABS 8.4*  --   --   HGB 10.5* 10.9* 10.7*  HCT 32.2* 32.0* 33.0*  MCV 89.2  --  88.2  PLT 428*  --  413*    Basic Metabolic Panel:  Recent Labs  Lab 09/26/17 1504 09/26/17 1527 09/27/17 0151  NA 137 140 136  K 3.6 3.7 3.6  CL 106 104 105  CO2 22  --  21*  GLUCOSE 102* 101* 117*  BUN 25* 27* 27*  CREATININE 1.17* 1.00 1.05*   CALCIUM 9.0  --  8.7*    Lipid Panel:     Component Value Date/Time   CHOL 103 09/27/2017 0151   TRIG 124 09/27/2017 0151   HDL 37 (L) 09/27/2017 0151   CHOLHDL 2.8 09/27/2017 0151   VLDL 25 09/27/2017 0151   LDLCALC 41 09/27/2017 0151   HgbA1c:  Lab Results  Component Value Date   HGBA1C 5.9 (H) 09/27/2017   Urine Drug Screen:     Component Value Date/Time   LABOPIA NONE DETECTED 09/26/2017 2351   COCAINSCRNUR NONE DETECTED 09/26/2017 2351   LABBENZ NONE DETECTED 09/26/2017 2351   AMPHETMU NONE DETECTED 09/26/2017 2351   THCU NONE DETECTED 09/26/2017 2351   LABBARB NONE DETECTED 09/26/2017 2351    Alcohol Level     Component Value Date/Time   ETH <10 09/26/2017 1918    IMAGING  Mr Jodene Nam Head Wo Contrast 09/27/2017 IMPRESSION:  Severely motion degraded MRI brain exam, of marginal diagnostic utility.  No convincing areas of acute infarction are observed. See discussion above.  Severe motion degradation of the MRA sequences as well.  Gross patency of the carotid, vertebral, and basilar arteries is established.    Ct Head Wo Contrast 09/26/2017 IMPRESSION:  Atrophy and small vessel disease.  Old RIGHT MCA territory infarct. Chronic calcified or ossified LEFT convexity meningioma,  without changes of associated vasogenic edema in the cortex. Calcific cerebrovascular atherosclerotic disease. No acute intracranial findings.   Dg Chest Portable 1 View 09/26/2017 IMPRESSION:  Mild right lower lobe airspace disease and small right effusion.    Transthoracic Echocardiogram  09/27/2017 Study Conclusions - Left ventricle: The cavity size was normal. Wall thickness was   increased in a pattern of mild LVH. Systolic function was normal.   The estimated ejection fraction was in the range of 60% to 65%.   Wall motion was normal; there were no regional wall motion   abnormalities. The study is not technically sufficient to allow   evaluation of LV diastolic function. -  Mitral valve: Severely calcified annulus. Mildly thickened   leaflets . There was mild regurgitation. - Left atrium: The atrium was normal in size. Volume/bsa, ES,   (1-plane Simpson&'s, A2C): 24.3 ml/m^2.   Bilateral Carotid Dopplers - pending 00/00/00     PHYSICAL EXAM Vitals:   09/27/17 1116 09/27/17 1200 09/27/17 1443 09/27/17 1634  BP: 115/60 134/83 (!) 148/72 132/73  Pulse: 82 60 98 89  Resp: 20 17 (!) 22 14  Temp:    (!) 97 F (36.1 C)  TempSrc:    Axillary  SpO2: 100% 96% 92% (!) 89%  Pleasant elderly Caucasian lady not in distress. . Afebrile. Head is nontraumatic. Neck is supple without bruit.    Cardiac exam no murmur or gallop. Lungs are clear to auscultation. Distal pulses are well felt. Neurological Exam ;  Awake  Alert oriented x 3. Normal speech and language.eye movements full without nystagmus.fundi were not visualized. Vision acuity and fields appear normal. Hearing is normal. Palatal movements are normal. Face symmetric. Tongue midline. Normal strength, tone, reflexes and coordination. Normal sensation. Gait deferred.     ASSESSMENT/PLAN [name] is a 83 y.o. female with history of previous stroke, obesity, recently treated for right lower lobe pneumonia, hypertension, hyperlipidemia, and newly diagnosed atrial fibrillation presenting with transient speech difficulties. She did not receive IV t-PA due to resolution of deficits  Possible TIA:  Most likely embolic secondary to atrial fibrillation without anticoagulation.  Resultant -  resolution of deficits.  CT head - Old RIGHT MCA territory infarct.  MRI head - motion degraded. No obvious acute infarct.  MRA head - motion degraded. No high-grade stenosis noted.  Carotid Doppler - pending  2D Echo - EF 60-65%. No cardiac source of emboli identified.  LDL - 41  HgbA1c - 5.9   VTE prophylaxis - IV heparin Diet Heart Room service appropriate? Yes; Fluid consistency: Thin  clopidogrel 75  mg daily prior to admission, now on heparin IV  Patient counseled to be compliant with her antithrombotic medications  Ongoing aggressive stroke risk factor management  Therapy recommendations:  pending  Disposition:  Pending  Hypertension  Stable  Permissive hypertension (OK if < 220/120) but gradually normalize in 5-7 days  Long-term BP goal normotensive  Hyperlipidemia  Home meds:  Fenofibrate 54 mg daily continued.  LDL 41, goal < 70   Other Stroke Risk Factors  Advanced age  Former cigarette smoker - quit  Obesity, There is no height or weight on file to calculate BMI., recommend weight loss, diet and exercise as appropriate   Hx stroke/TIA  Atrial fibrillation   Other Active Problems  Atrial fibrillation -> cardiology consult   Suspected pneumonia - doxycycline  Mild leukocytosis  Anemia  Renal - BUN 27 ; creatinine 1.05   Plan / Recommendations  Per  cardiology - change to Eliquis and oral diltiazem when taking appropriate.  Hospital day # 1  Mikey Bussing PA-C [facility] Pager [phone] 09/27/2017, 5:09 PM I have personally examined this patient, reviewed notes, independently viewed imaging studies, participated in medical decision making and plan of care.ROS completed by me personally and pertinent positives fully documented  I have made any additions or clarifications directly to the above note. Agree with note above.  She presented with transient expressive language difficulties in the setting of new onset atrial fibrillation likely left hemispheric TIA.  She will need long-term anticoagulation with Eliquis.  Patient appears to be claustrophobic hence give Ativan on call for MRI.  Long discussion with daughter and patient and answered questions.  Greater than 50% time during this 35-minute visit was spent on counseling and coordination of care about TIA, atrial fibrillation, discussion about risk reduction and bleeding and  answering questions  Antony Contras, MD Medical Director [place] Pager: [phone] 09/27/2017 5:51 PM   To contact Stroke Continuity provider, please refer to [email or web address] After hours, contact General Neurology

## 2017-09-27 NOTE — Progress Notes (Signed)
Pt appears to have vaginal yeast infection. Per dtr, common and diflucan has helped in the past. Relayed to attending MD

## 2017-09-27 NOTE — Progress Notes (Signed)
ANTICOAGULATION CONSULT NOTE - F/u Consult  Pharmacy Consult for heparin Indication: atrial fibrillation  Allergies  Allergen Reactions  . Levaquin [Levofloxacin] Other (See Comments)    Severe intermittent arm pain  . Ace Inhibitors Cough  . Codeine Other (See Comments)    Unknown reaction - didn't feel well?  . Penicillins Other (See Comments)    Caused high fever Has patient had a PCN reaction causing immediate rash, facial/tongue/throat swelling, SOB or lightheadedness with hypotension: No Has patient had a PCN reaction causing severe rash involving mucus membranes or skin necrosis: No Has patient had a PCN reaction that required hospitalization: No Has patient had a PCN reaction occurring within the last 10 years: No If all of the above answers are "NO", then may proceed with Cephalosporin use.  . Sulfa Antibiotics Other (See Comments)    Possibly caused fever  . Sulfasalazine Other (See Comments)    Possibly caused fever    Patient Measurements: Heparin Dosing Weight: 63 kg   Vital Signs: BP: 139/66 (11/30 2330) Pulse Rate: 101 (11/30 2330)  Labs: Recent Labs    09/26/17 1504 09/26/17 1527 09/27/17 0151  HGB 10.5* 10.9* 10.7*  HCT 32.2* 32.0* 33.0*  PLT 428*  --  413*  APTT 44*  --   --   LABPROT 16.8*  --   --   INR 1.38  --   --   HEPARINUNFRC  --   --  <0.10*  CREATININE 1.17* 1.00 1.05*    Estimated Creatinine Clearance: 31.3 mL/min (A) (by C-G formula based on SCr of 1.05 mg/dL (H)).   Medical History: Past Medical History:  Diagnosis Date  . Allergic rhinitis   . BCC (basal cell carcinoma of skin) 04/10/2015   sees dermatologist  . Depression 10/17/2012  . Ectopic pregnancy   . GERD (gastroesophageal reflux disease)    hx hiatal hernia, has seen GI, ENT and allergist in the past  . Hyperlipidemia   . Hypertension   . OA (osteoarthritis)    knees and back, hx DDD, s/p remote lumbar disc surgery  . OAB (overactive bladder) 04/10/2015  .  Obesity   . Osteoporosis    on prolia in the past  . Stroke [facility])    sees neurologist, hx memory loss, expressive aphasia  . Venous insufficiency    Assessment: 74 YOF who presented to the ED with worsening aphasia and confusion. She was found to have new onset A.fib with RVR and pharmacy was consulted to start IV heparin.   Heparin level is undetectable. RN evaluate patient at bedside and reports no issues with line and states patient has not lost IV access overnight. Level drawn appropriately at ~7hrs after infusion started. CBC stable, no bleeding noted.   Per neurology notes, suspects patient had TIA, so will aim for lower heparin goal.   Will conservatively bolus and rate increase given possible TIA, in addition to advanced age and CrCl ~ 30 mL/min.   Goal of Therapy:  Heparin level 0.3-0.5 units/mL Monitor platelets by anticoagulation protocol: Yes   Plan:  Heparin bolus 1000 units IV x1  Increase heparin gtt to 1050 units/hr  Heparin level in 8 hrs Daily heparin level and CBC Monitor for s/s bleeding  Lavonda Jumbo, PharmD Clinical Pharmacist 09/27/17 3:22 AM

## 2017-09-27 NOTE — Progress Notes (Signed)
Subjective:  She just got back from MRI and was given Ativan there so I cannot arouse her to get a good history.  Objective:  Vital Signs in the last 24 hours: BP 134/83   Pulse 60   Temp 97.9 F (36.6 C) (Oral)   Resp 17   SpO2 96%   Physical Exam: Elderly female somnolent in no acute distress Lungs:  Clear Cardiac: Irregular rhythm, normal S1 and S2, no S3 Abdomen:  Soft, nontender, no masses Extremities: Venous insufficiency changes noted, 2+ edema. Intake/Output from previous day: 11/30 0701 - 12/01 0700 In: -  Out: 700 [Urine:700]  Weight There were no vitals filed for this visit.  Lab Results: Basic Metabolic Panel: Recent Labs    09/26/17 1504 09/26/17 1527 09/27/17 0151  NA 137 140 136  K 3.6 3.7 3.6  CL 106 104 105  CO2 22  --  21*  GLUCOSE 102* 101* 117*  BUN 25* 27* 27*  CREATININE 1.17* 1.00 1.05*   CBC: Recent Labs    09/26/17 1504 09/26/17 1527 09/27/17 0151  WBC 12.0*  --  11.4*  NEUTROABS 8.4*  --   --   HGB 10.5* 10.9* 10.7*  HCT 32.2* 32.0* 33.0*  MCV 89.2  --  88.2  PLT 428*  --  413*   Cardiac Enzymes: Troponin (Point of Care Test) Recent Labs    09/26/17 1525  TROPIPOC 0.31*   Telemetry: Atrial fibrillation rate currently controlled  Assessment/Plan:  1.  Recent stroke 2.  Persistent atrial fibrillation 3.  Obesity  Recommendations:  Fairly somnolent at this time.  When able to take oral medicines switched to Eliquis as well as an oral diltiazem.  In the meantime consider intravenous treatment.     Kerry Hough  MD [facility]  09/27/2017, 2:33 PM

## 2017-09-28 ENCOUNTER — Other Ambulatory Visit: Payer: Self-pay

## 2017-09-28 ENCOUNTER — Encounter (HOSPITAL_COMMUNITY): Payer: Self-pay | Admitting: Emergency Medicine

## 2017-09-28 ENCOUNTER — Inpatient Hospital Stay (HOSPITAL_COMMUNITY): Payer: Medicare Other

## 2017-09-28 DIAGNOSIS — N183 Chronic kidney disease, stage 3 (moderate): Secondary | ICD-10-CM

## 2017-09-28 DIAGNOSIS — N3281 Overactive bladder: Secondary | ICD-10-CM

## 2017-09-28 DIAGNOSIS — G459 Transient cerebral ischemic attack, unspecified: Secondary | ICD-10-CM

## 2017-09-28 DIAGNOSIS — R4701 Aphasia: Secondary | ICD-10-CM

## 2017-09-28 LAB — BASIC METABOLIC PANEL
ANION GAP: 11 (ref 5–15)
BUN: 30 mg/dL — ABNORMAL HIGH (ref 6–20)
CO2: 20 mmol/L — ABNORMAL LOW (ref 22–32)
Calcium: 8.6 mg/dL — ABNORMAL LOW (ref 8.9–10.3)
Chloride: 102 mmol/L (ref 101–111)
Creatinine, Ser: 1.09 mg/dL — ABNORMAL HIGH (ref 0.44–1.00)
GFR calc non Af Amer: 44 mL/min — ABNORMAL LOW (ref >60)
GFR, EST AFRICAN AMERICAN: 51 mL/min — AB (ref >60)
Glucose, Bld: 107 mg/dL — ABNORMAL HIGH (ref 65–99)
POTASSIUM: 3.5 mmol/L (ref 3.5–5.1)
SODIUM: 133 mmol/L — AB (ref 135–145)

## 2017-09-28 LAB — CBC
HCT: 34.6 % — ABNORMAL LOW (ref 36.0–46.0)
Hemoglobin: 11 g/dL — ABNORMAL LOW (ref 12.0–15.0)
MCH: 28.9 pg (ref 26.0–34.0)
MCHC: 31.8 g/dL (ref 30.0–36.0)
MCV: 91.1 fL (ref 78.0–100.0)
PLATELETS: 433 10*3/uL — AB (ref 150–400)
RBC: 3.8 MIL/uL — AB (ref 3.87–5.11)
RDW: 14.6 % (ref 11.5–15.5)
WBC: 12.2 10*3/uL — AB (ref 4.0–10.5)

## 2017-09-28 LAB — HEPARIN LEVEL (UNFRACTIONATED)

## 2017-09-28 MED ORDER — APIXABAN 5 MG PO TABS: 5.0000 mg | ORAL_TABLET | Freq: Two times a day (BID) | ORAL | 0 refills | Status: DC

## 2017-09-28 MED ORDER — FUROSEMIDE 20 MG PO TABS: 20.0000 mg | ORAL_TABLET | Freq: Every day | ORAL | 3 refills | Status: DC | PRN

## 2017-09-28 MED ORDER — CARVEDILOL 3.125 MG PO TABS: 3.1250 mg | ORAL_TABLET | Freq: Two times a day (BID) | ORAL | 0 refills | Status: DC

## 2017-09-28 MED ORDER — APIXABAN 5 MG PO TABS
5.0000 mg | ORAL_TABLET | Freq: Two times a day (BID) | ORAL | Status: DC
  Administered 2017-09-28: 5 mg via ORAL
  Filled 2017-09-28: qty 1

## 2017-09-28 MED ORDER — DILTIAZEM HCL ER COATED BEADS 240 MG PO CP24
240.0000 mg | ORAL_CAPSULE | Freq: Every day | ORAL | Status: DC
  Administered 2017-09-28: 240 mg via ORAL
  Filled 2017-09-28: qty 1

## 2017-09-28 MED ORDER — MAGNESIUM OXIDE 400 (241.3 MG) MG PO TABS
400.0000 mg | ORAL_TABLET | Freq: Once | ORAL | Status: AC
  Administered 2017-09-28: 400 mg via ORAL
  Filled 2017-09-28: qty 1

## 2017-09-28 MED ORDER — DILTIAZEM HCL ER COATED BEADS 240 MG PO CP24: 240.0000 mg | ORAL_CAPSULE | Freq: Every day | ORAL | 0 refills | Status: DC

## 2017-09-28 NOTE — Progress Notes (Signed)
ANTICOAGULATION CONSULT NOTE  Pharmacy Consult:  Heparin Indication: atrial fibrillation  Allergies  Allergen Reactions  . Levaquin [Levofloxacin] Other (See Comments)    Severe intermittent arm pain  . Ace Inhibitors Cough  . Codeine Other (See Comments)    Unknown reaction - didn't feel well?  . Penicillins Other (See Comments)    Caused high fever Has patient had a PCN reaction causing immediate rash, facial/tongue/throat swelling, SOB or lightheadedness with hypotension: No Has patient had a PCN reaction causing severe rash involving mucus membranes or skin necrosis: No Has patient had a PCN reaction that required hospitalization: No Has patient had a PCN reaction occurring within the last 10 years: No If all of the above answers are "NO", then may proceed with Cephalosporin use.  . Sulfa Antibiotics Other (See Comments)    Possibly caused fever  . Sulfasalazine Other (See Comments)    Possibly caused fever    Patient Measurements: Heparin Dosing Weight: 63 kg   Assessment: 8 YOF who presented to the ED with worsening aphasia and confusion, and possible TIA. Started on heparin for new Afib with RVR (CHADsVASc 6-7), possible new TIA. Now to transition to Eliquis. Hgb 10.7, plts 413.  Goal of Therapy:  Monitor platelets by anticoagulation protocol: Yes   Plan:  Stop heparin gtt Start Eliquis 5mg  PO BID Monitor CBC, s/s of bleed  Elenor Quinones, PharmD, [facility] Clinical Pharmacist Pager [phone] 09/28/2017 7:59 AM

## 2017-09-28 NOTE — Discharge Summary (Signed)
Physician Discharge Summary  Charlene Wade HCW:[id number] DOB: Feb 01, 1928 DOA: 09/26/2017  PCP: Lucretia Kern, DO  Admit date: 09/26/2017 Discharge date: 09/28/2017  Admitted From: home Disposition:  home   Recommendations for Outpatient Follow-up:  1. F/u rate control of A-fib    Discharge Condition:  stable   CODE STATUS:  Full code   Consultations:  stable    Discharge Diagnoses:  Principal Problem:   New onset a-fib ([place]) Active Problems:   Aphasia   Essential hypertension   Leg edema   OAB (overactive bladder)   CKD (chronic kidney disease) stage 3, GFR 30-59 ml/min (HCC)    Subjective: Noted to have a cough. She states that she gets it on an off and is not a major issue. No other complaints.   Brief Summary: Charlene Wade is a 83 y.o.femalewith medical history ofchronic pain, depression, HTN and HLDwho has been on treatment for a suspected RLL pneumonia as outpt withDoxycycline. She is brought in by her caretaker for on and off difficulty with speaking for the past two days. The patient was having trouble organizing her words. There was not slurred speech. In the ER, she is found to have A-fib with RVR and after Cardizem infusion has been started, she has regained her ability to speak correctly. The patient states that she has had no other neurological issues such as change in vision, swallowing, numbness or tingling, focal weakness or headaches. She states her cough is improved and is now not longer productive. She has had some mild dyspnea on exertion. No chest pain or palpitations.    Hospital Course:  Principal Problem: New onset a-fib  - has been started on Cardizem and Heparin infusions and Coreg -  TSH normal - ECHO noted below - cardiology consulted by ER (Dr Sallyanne Kuster) - appreciate their assitance - transitioned to oral Cardizem and Eliquis- d/c Plavix which the patient states she was on for a CVA  Active Problems: aphasia - h/o CVA in the  past - likely due to rapid HR resolved quickly in ER once HR was better controlled on Cardizem infusion - CT head negative - carotid duplex unremarkable- see report below - LDL 41 - A1c 5.9 - MRI >> Severely motion degraded MRI brain exam, of marginal diagnostic utility. No convincing areas of acute infarction are observed. See discussion above. - switch from Plavix to Eliquis  Recent pneumonia - cont Doxycyline which was started on 11/24  Mildly elevated troponin - possibly cardiac strain  Essential hypertension - cont Losartan, Coreg and Cardizem  Leg edema - chronic issue- on Lasix and uses TEDS- will elevated- should use Lasix only PRN to prevent dehydration  Urinary urgency and incontinence - Ditropan  Anemia - anemia panel consistent with AOCD     Discharge Instructions  Discharge Instructions    Diet - low sodium heart healthy   Complete by:  As directed    Increase activity slowly   Complete by:  As directed      Allergies as of 09/28/2017      Reactions   Levaquin [levofloxacin] Other (See Comments)   Severe intermittent arm pain   Ace Inhibitors Cough   Codeine Other (See Comments)   Unknown reaction - didn't feel well?   Penicillins Other (See Comments)   Caused high fever Has patient had a PCN reaction causing immediate rash, facial/tongue/throat swelling, SOB or lightheadedness with hypotension: No Has patient had a PCN reaction causing severe rash involving mucus membranes or  skin necrosis: No Has patient had a PCN reaction that required hospitalization: No Has patient had a PCN reaction occurring within the last 10 years: No If all of the above answers are "NO", then may proceed with Cephalosporin use.   Sulfa Antibiotics Other (See Comments)   Possibly caused fever   Sulfasalazine Other (See Comments)   Possibly caused fever      Medication List    STOP taking these medications   clopidogrel 75 MG tablet Commonly known as:   PLAVIX     TAKE these medications   acetaminophen 500 MG tablet Commonly known as:  TYLENOL Take 500 mg by mouth 2 (two) times daily as needed for headache (pain).   apixaban 5 MG Tabs tablet Commonly known as:  ELIQUIS Take 1 tablet (5 mg total) by mouth 2 (two) times daily.   benzonatate 100 MG capsule Commonly known as:  TESSALON PERLES Take 1 capsule (100 mg total) by mouth 3 (three) times daily as needed. What changed:  reasons to take this   carvedilol 3.125 MG tablet Commonly known as:  COREG Take 1 tablet (3.125 mg total) by mouth 2 (two) times daily with a meal.   cetirizine 10 MG tablet Commonly known as:  ZYRTEC Take 10 mg by mouth at bedtime.   cholecalciferol 1000 units tablet Commonly known as:  VITAMIN D Take 2,000 Units by mouth 2 (two) times daily.   denosumab 60 MG/ML Soln injection Commonly known as:  PROLIA Inject 60 mg into the skin every 6 (six) months. Administer in upper arm, thigh, or abdomen (last injection June or July 2018)   diclofenac sodium 1 % Gel Commonly known as:  VOLTAREN Apply 1 application topically 4 (four) times daily as needed (arm pain/bursitis).   diltiazem 240 MG 24 hr capsule Commonly known as:  CARDIZEM CD Take 1 capsule (240 mg total) by mouth daily.   doxycycline 100 MG tablet Commonly known as:  VIBRA-TABS Take 1 tablet (100 mg total) by mouth 2 (two) times daily. What changed:  additional instructions   DULoxetine 20 MG capsule Commonly known as:  CYMBALTA Take 1 capsule (20 mg total) by mouth daily.   fenofibrate 145 MG tablet Commonly known as:  TRICOR take 1 tablet by mouth once daily What changed:    how much to take  how to take this  when to take this   fluticasone 50 MCG/ACT nasal spray Commonly known as:  FLONASE instill 1 spray into each nostril twice a day   furosemide 20 MG tablet Commonly known as:  LASIX Take 1 tablet (20 mg total) by mouth daily as needed. Use only as needed for severe  leg swelling. What changed:    when to take this  reasons to take this  additional instructions   losartan 50 MG tablet Commonly known as:  COZAAR take 1 tablet by mouth once daily What changed:    how much to take  how to take this  when to take this   oxybutynin 5 MG 24 hr tablet Commonly known as:  DITROPAN-XL take 1 tablet by mouth once daily What changed:    how much to take  how to take this  when to take this   pantoprazole 20 MG tablet Commonly known as:  PROTONIX take 1 tablet by mouth once daily What changed:    how much to take  how to take this  when to take this   PRESERVISION AREDS PO Take 1 tablet  by mouth 3 (three) times daily.   SYSTANE OP Place 1 drop into both eyes 2 (two) times daily as needed (dry eyes).   VITAMIN B12 PO Take 1 tablet by mouth daily.       Allergies  Allergen Reactions  . Levaquin [Levofloxacin] Other (See Comments)    Severe intermittent arm pain  . Ace Inhibitors Cough  . Codeine Other (See Comments)    Unknown reaction - didn't feel well?  . Penicillins Other (See Comments)    Caused high fever Has patient had a PCN reaction causing immediate rash, facial/tongue/throat swelling, SOB or lightheadedness with hypotension: No Has patient had a PCN reaction causing severe rash involving mucus membranes or skin necrosis: No Has patient had a PCN reaction that required hospitalization: No Has patient had a PCN reaction occurring within the last 10 years: No If all of the above answers are "NO", then may proceed with Cephalosporin use.  . Sulfa Antibiotics Other (See Comments)    Possibly caused fever  . Sulfasalazine Other (See Comments)    Possibly caused fever     Procedures/Studies: Carotid duplex Preliminary report:  1-39% ICA plaquing. Vertebral artery flow is antegrade.   Study Conclusions  - Left ventricle: The cavity size was normal. Wall thickness was   increased in a pattern of mild LVH.  Systolic function was normal.   The estimated ejection fraction was in the range of 60% to 65%.   Wall motion was normal; there were no regional wall motion   abnormalities. The study is not technically sufficient to allow   evaluation of LV diastolic function. - Mitral valve: Severely calcified annulus. Mildly thickened   leaflets . There was mild regurgitation. - Left atrium: The atrium was normal in size. Volume/bsa, ES,   Ct Head Wo Contrast  Result Date: 09/26/2017 CLINICAL DATA:  History of stroke many years ago. New onset of headaches. EXAM: CT HEAD WITHOUT CONTRAST TECHNIQUE: Contiguous axial images were obtained from the base of the skull through the vertex without intravenous contrast. COMPARISON:  Multiple priors. Most recent MR 10/14/2012. Most recent CT 10/16/2012. FINDINGS: Brain: No evidence for acute stroke, acute hemorrhage, or extra-axial fluid. Advanced atrophy, with hydrocephalus ex vacuo. Calcified or ossified meningioma, LEFT frontal convexity, 14 x 19 x 21 mm, stable. No adjacent vasogenic edema. Extensive hypoattenuation of white matter representing small vessel disease. Moderately large remote RIGHT posterior frontal, posterior temporal, and parietal cortical and subcortical infarction with gliosis and encephalomalacia. Vascular: Calcification of the cavernous internal carotid arteries and distal vertebral arteries consistent with cerebrovascular atherosclerotic disease. No signs of intracranial large vessel occlusion. Calcific MCA branches in the RIGHT sylvian fissure is stable. Skull: Normal. Negative for fracture or focal lesion. Sinuses/Orbits: No acute finding.  BILATERAL cataract extraction. Other: Similar appearance to priors. IMPRESSION: Atrophy and small vessel disease.  Old RIGHT MCA territory infarct. Chronic calcified or ossified LEFT convexity meningioma, without changes of associated vasogenic edema in the cortex. Calcific cerebrovascular atherosclerotic disease.  No acute intracranial findings. Electronically Signed   By: Staci Righter M.D.   On: 09/26/2017 17:00   Mr Virgel Paling GX Contrast  Result Date: 09/27/2017 CLINICAL DATA:  Transient aphasia in the setting of new onset atrial fibrillation. Suspected transient ischemic attack. EXAM: MRI HEAD WITHOUT CONTRAST MRA HEAD WITHOUT CONTRAST TECHNIQUE: Multiplanar, multiecho pulse sequences of the brain and surrounding structures were obtained without intravenous contrast. Angiographic images of the head were obtained using MRA technique without contrast. COMPARISON:  CT  head 09/26/2017. FINDINGS: There is severe motion degradation. The study is of marginal diagnostic utility. MRI HEAD FINDINGS Brain: No convincing areas of restricted diffusion are observed. Due to motion degradation, there is insufficient correlation between the axial and coronal DWI sequences and their corresponding ADC maps. There is advanced atrophy with chronic microvascular ischemic change. A chronic calcified or ossified 2 cm LEFT frontal meningioma is redemonstrated, without adjacent edema. Vascular: Flow voids are maintained. Skull and upper cervical spine: Not assessed. Sinuses/Orbits: No visible layering fluid. BILATERAL cataract extraction. Other: None. MRA HEAD FINDINGS Patency of the internal carotid arteries is established. Patency of the basilar artery is established with BILATERAL vertebral contribution, greater on the LEFT. Both middle cerebral arteries demonstrate flow related enhancement. IMPRESSION: Severely motion degraded MRI brain exam, of marginal diagnostic utility. No convincing areas of acute infarction are observed. See discussion above. Severe motion degradation of the MRA sequences as well. Gross patency of the carotid, vertebral, and basilar arteries is established. Electronically Signed   By: Staci Righter M.D.   On: 09/27/2017 15:02   Mr Brain Wo Contrast  Result Date: 09/27/2017 CLINICAL DATA:  Transient aphasia in the  setting of new onset atrial fibrillation. Suspected transient ischemic attack. EXAM: MRI HEAD WITHOUT CONTRAST MRA HEAD WITHOUT CONTRAST TECHNIQUE: Multiplanar, multiecho pulse sequences of the brain and surrounding structures were obtained without intravenous contrast. Angiographic images of the head were obtained using MRA technique without contrast. COMPARISON:  CT head 09/26/2017. FINDINGS: There is severe motion degradation. The study is of marginal diagnostic utility. MRI HEAD FINDINGS Brain: No convincing areas of restricted diffusion are observed. Due to motion degradation, there is insufficient correlation between the axial and coronal DWI sequences and their corresponding ADC maps. There is advanced atrophy with chronic microvascular ischemic change. A chronic calcified or ossified 2 cm LEFT frontal meningioma is redemonstrated, without adjacent edema. Vascular: Flow voids are maintained. Skull and upper cervical spine: Not assessed. Sinuses/Orbits: No visible layering fluid. BILATERAL cataract extraction. Other: None. MRA HEAD FINDINGS Patency of the internal carotid arteries is established. Patency of the basilar artery is established with BILATERAL vertebral contribution, greater on the LEFT. Both middle cerebral arteries demonstrate flow related enhancement. IMPRESSION: Severely motion degraded MRI brain exam, of marginal diagnostic utility. No convincing areas of acute infarction are observed. See discussion above. Severe motion degradation of the MRA sequences as well. Gross patency of the carotid, vertebral, and basilar arteries is established. Electronically Signed   By: Staci Righter M.D.   On: 09/27/2017 15:02   Dg Chest Portable 1 View  Result Date: 09/26/2017 CLINICAL DATA:  Short of breath EXAM: PORTABLE CHEST 1 VIEW COMPARISON:  01/25/2015 FINDINGS: Cardiac enlargement without heart failure.  Atherosclerotic aorta. Right lower lobe airspace disease with mild progression. Probable  atelectasis. Small right effusion. Left lung clear. IMPRESSION: Mild right lower lobe airspace disease and small right effusion. Electronically Signed   By: Franchot Gallo M.D.   On: 09/26/2017 15:30       Discharge Exam: Vitals:   09/28/17 1100 09/28/17 1153  BP: (!) 116/95 121/62  Pulse: 83   Resp: 14   Temp:    SpO2: 95%    Vitals:   09/28/17 1000 09/28/17 1043 09/28/17 1100 09/28/17 1153  BP:  (!) 111/53 (!) 116/95 121/62  Pulse: 80  83   Resp: 15  14   Temp:      TempSrc:      SpO2: 95%  95%   Weight:  Height:        General: Pt is alert, awake, not in acute distress Cardiovascular: RRR, S1/S2 +, no rubs, no gallops Respiratory: CTA bilaterally, no wheezing, no rhonchi Abdominal: Soft, NT, ND, bowel sounds + Extremities: no edema, no cyanosis    The results of significant diagnostics from this hospitalization (including imaging, microbiology, ancillary and laboratory) are listed below for reference.     Microbiology: Recent Results (from the past 240 hour(s))  MRSA PCR Screening     Status: None   Collection Time: 09/27/17  3:54 PM  Result Value Ref Range Status   MRSA by PCR NEGATIVE NEGATIVE Final    Comment:        The GeneXpert MRSA Assay (FDA approved for NASAL specimens only), is one component of a comprehensive MRSA colonization surveillance program. It is not intended to diagnose MRSA infection nor to guide or monitor treatment for MRSA infections.      Labs: BNP (last 3 results) No results for input(s): BNP in the last 8760 hours. Basic Metabolic Panel: Recent Labs  Lab 09/26/17 1504 09/26/17 1527 09/27/17 0151 09/28/17 0653  NA 137 140 136 133*  K 3.6 3.7 3.6 3.5  CL 106 104 105 102  CO2 22  --  21* 20*  GLUCOSE 102* 101* 117* 107*  BUN 25* 27* 27* 30*  CREATININE 1.17* 1.00 1.05* 1.09*  CALCIUM 9.0  --  8.7* 8.6*   Liver Function Tests: Recent Labs  Lab 09/26/17 1504  AST 25  ALT 9*  ALKPHOS 53  BILITOT 0.4  PROT  5.9*  ALBUMIN 2.5*   No results for input(s): LIPASE, AMYLASE in the last 168 hours. No results for input(s): AMMONIA in the last 168 hours. CBC: Recent Labs  Lab 09/26/17 1504 09/26/17 1527 09/27/17 0151 09/28/17 0653  WBC 12.0*  --  11.4* 12.2*  NEUTROABS 8.4*  --   --   --   HGB 10.5* 10.9* 10.7* 11.0*  HCT 32.2* 32.0* 33.0* 34.6*  MCV 89.2  --  88.2 91.1  PLT 428*  --  413* 433*   Cardiac Enzymes: No results for input(s): CKTOTAL, CKMB, CKMBINDEX, TROPONINI in the last 168 hours. BNP: Invalid input(s): POCBNP CBG: No results for input(s): GLUCAP in the last 168 hours. D-Dimer No results for input(s): DDIMER in the last 72 hours. Hgb A1c Recent Labs    09/27/17 0151  HGBA1C 5.9*   Lipid Profile Recent Labs    09/27/17 0151  CHOL 103  HDL 37*  LDLCALC 41  TRIG 124  CHOLHDL 2.8   Thyroid function studies Recent Labs    09/27/17 0151  TSH 0.438   Anemia work up Recent Labs    09/26/17 1918 09/27/17 0151  VITAMINB12  --  2,063*  FOLATE 78.0  --   FERRITIN 79  --   TIBC 263  --   IRON 24*  --   RETICCTPCT 2.3  --    Urinalysis    Component Value Date/Time   COLORURINE YELLOW 09/26/2017 2351   APPEARANCEUR HAZY (A) 09/26/2017 2351   LABSPEC 1.013 09/26/2017 2351   PHURINE 5.0 09/26/2017 2351   GLUCOSEU NEGATIVE 09/26/2017 2351   HGBUR NEGATIVE 09/26/2017 2351   BILIRUBINUR NEGATIVE 09/26/2017 2351   KETONESUR NEGATIVE 09/26/2017 2351   PROTEINUR 100 (A) 09/26/2017 2351   UROBILINOGEN 0.2 10/16/2012 2210   NITRITE NEGATIVE 09/26/2017 2351   LEUKOCYTESUR NEGATIVE 09/26/2017 2351   Sepsis Labs Invalid input(s): PROCALCITONIN,  WBC,  LACTICIDVEN Microbiology  Recent Results (from the past 240 hour(s))  MRSA PCR Screening     Status: None   Collection Time: 09/27/17  3:54 PM  Result Value Ref Range Status   MRSA by PCR NEGATIVE NEGATIVE Final    Comment:        The GeneXpert MRSA Assay (FDA approved for NASAL specimens only), is one  component of a comprehensive MRSA colonization surveillance program. It is not intended to diagnose MRSA infection nor to guide or monitor treatment for MRSA infections.      Time coordinating discharge: Over 30 minutes  SIGNED:   Debbe Odea, MD  [facility] 09/28/2017, 12:03 PM Pager   If 7PM-7AM, please contact night-coverage [email or web address] Password [email or web address]

## 2017-09-28 NOTE — Progress Notes (Signed)
VASCULAR LAB PRELIMINARY  PRELIMINARY  PRELIMINARY  PRELIMINARY  Carotid duplex completed.    Preliminary report:  1-39% ICA plaquing. Vertebral artery flow is antegrade.   Faust Thorington, RVT 09/28/2017, 10:55 AM

## 2017-09-28 NOTE — Progress Notes (Signed)
Subjective:  Able to give history this morning.  Mainly complains of pain involving her left leg where she has had cellulitis.  No complaints of shortness of breath or chest pain.  Currently off of intravenous diltiazem.  Objective:  Vital Signs in the last 24 hours: BP 139/65   Pulse 92   Temp 97.7 F (36.5 C) (Axillary) Comment (Src): pt. sleeping  Resp 15   Ht 4\' 8"  (1.422 m)   Wt 74.9 kg (165 lb 2 oz)   SpO2 100%   BMI 37.02 kg/m   Physical Exam: Elderly female currently in no acute distress Lungs:  Clear Cardiac: Irregular rhythm, normal S1 and S2, no S3 Abdomen:  Soft, nontender, no masses Extremities: Venous insufficiency changes noted, 1+ edema.  Left leg currently with ice pack on it so the leg is cold but the foot appeared warm to me.  Intake/Output from previous day: 12/01 0701 - 12/02 0700 In: 1507.3 [P.O.:240; I.V.:1267.3] Out: -   Weight Filed Weights   09/27/17 2000 09/27/17 2030 09/28/17 0426  Weight: 74.1 kg (163 lb 5.8 oz) 74.9 kg (165 lb 2 oz) 74.9 kg (165 lb 2 oz)    Lab Results: Basic Metabolic Panel: Recent Labs    09/27/17 0151 09/28/17 0653  NA 136 133*  K 3.6 3.5  CL 105 102  CO2 21* 20*  GLUCOSE 117* 107*  BUN 27* 30*  CREATININE 1.05* 1.09*   CBC: Recent Labs    09/26/17 1504  09/27/17 0151 09/28/17 0653  WBC 12.0*  --  11.4* 12.2*  NEUTROABS 8.4*  --   --   --   HGB 10.5*   < > 10.7* 11.0*  HCT 32.2*   < > 33.0* 34.6*  MCV 89.2  --  88.2 91.1  PLT 428*  --  413* 433*   < > = values in this interval not displayed.   Cardiac Enzymes: Troponin (Point of Care Test) Recent Labs    09/26/17 1525  TROPIPOC 0.31*   Telemetry: Atrial fibrillation rate currently controlled  Echo: LVH with EF 60-65%, normal left atrial size, severe mitral calcification   Assessment/Plan:  1.  Persistent atrial fibrillation currently rate controlled 2.  Edema 3.  Hypertensive heart disease 4.  History of TIA and stroke currently in  symptoms resolved  Recommendations:  Diltiazem for rate control as long as remains asymptomatic.  Anticoagulation with Eliquis.  Followup with Dr. Gwenlyn Found in 1-2 weeks.   Kerry Hough  MD [facility]  09/28/2017, 9:43 AM

## 2017-09-28 NOTE — Progress Notes (Signed)
Discharge Note:  Patient alert and oriented x 4 and in no distress. Patient and her sister were given discharge instructions regarding signs and symptoms to report, medication, diet, activity, and upcoming appointments. They verbalized understanding of all instructions. Peripheral IV and telemetry discontinued. Patient confirmed that she had all of her personal belongings. She was transported out vial wheelchair by the charge RN, Elmyra Ricks.

## 2017-09-28 NOTE — Progress Notes (Signed)
STROKE TEAM PROGRESS NOTE   HISTORY OF PRESENT ILLNESS (per record) Charlene Wade is a 83 y.o. female who was in her normal state of health last night.  She woke up this morning, and thinks that she was normal, but did not really speak much until she was in the shower and it was too hot.  She tried to say that she was having difficulty and it was too hot, but the speech coming out was not understandable.  She states that she was saying incorrect words and that she was not understandable.  She did not have any difficulty understanding others.  She currently feels that she is completely resolved and back to baseline.  She denies numbness/weakness/visual change.  No antecedent symptoms.  She has been slightly short of breath, and having new pain in her left leg.  In the emergency department she was noted to be in atrial fibrillation and has been started on both diltiazem and heparin.  LKW: 11/29 prior to bed tpa given?: no, out of window, resolved symptoms.     SUBJECTIVE (INTERVAL HISTORY) No family members present. Dr. Wynelle Cleveland is present at the bedside. MRI scan of the brain was limited due to motion artifact but does not show definite stroke. Patient has been started on eliquis and Cardizem  OBJECTIVE Temp:  [97 F (36.1 C)-97.8 F (36.6 C)] 97.8 F (36.6 C) (12/02 0938) Pulse Rate:  [60-98] 66 (12/02 0938) Cardiac Rhythm: Atrial fibrillation (12/02 0830) Resp:  [12-22] 19 (12/02 0938) BP: (102-157)/(53-89) 111/53 (12/02 1043) SpO2:  [89 %-100 %] 96 % (12/02 0938) Weight:  [163 lb 5.8 oz (74.1 kg)-165 lb 2 oz (74.9 kg)] 165 lb 2 oz (74.9 kg) (12/02 0426)  CBC:  Recent Labs  Lab 09/26/17 1504  09/27/17 0151 09/28/17 0653  WBC 12.0*  --  11.4* 12.2*  NEUTROABS 8.4*  --   --   --   HGB 10.5*   < > 10.7* 11.0*  HCT 32.2*   < > 33.0* 34.6*  MCV 89.2  --  88.2 91.1  PLT 428*  --  413* 433*   < > = values in this interval not displayed.    Basic Metabolic Panel:  Recent  Labs  Lab 09/27/17 0151 09/28/17 0653  NA 136 133*  K 3.6 3.5  CL 105 102  CO2 21* 20*  GLUCOSE 117* 107*  BUN 27* 30*  CREATININE 1.05* 1.09*  CALCIUM 8.7* 8.6*    Lipid Panel:     Component Value Date/Time   CHOL 103 09/27/2017 0151   TRIG 124 09/27/2017 0151   HDL 37 (L) 09/27/2017 0151   CHOLHDL 2.8 09/27/2017 0151   VLDL 25 09/27/2017 0151   LDLCALC 41 09/27/2017 0151   HgbA1c:  Lab Results  Component Value Date   HGBA1C 5.9 (H) 09/27/2017   Urine Drug Screen:     Component Value Date/Time   LABOPIA NONE DETECTED 09/26/2017 2351   COCAINSCRNUR NONE DETECTED 09/26/2017 2351   LABBENZ NONE DETECTED 09/26/2017 2351   AMPHETMU NONE DETECTED 09/26/2017 2351   THCU NONE DETECTED 09/26/2017 2351   LABBARB NONE DETECTED 09/26/2017 2351    Alcohol Level     Component Value Date/Time   ETH <10 09/26/2017 1918    IMAGING  Mr Jodene Nam Head Wo Contrast 09/27/2017 IMPRESSION:  Severely motion degraded MRI brain exam, of marginal diagnostic utility.  No convincing areas of acute infarction are observed. See discussion above.  Severe motion degradation of the MRA  sequences as well.  Gross patency of the carotid, vertebral, and basilar arteries is established.    Ct Head Wo Contrast 09/26/2017 IMPRESSION:  Atrophy and small vessel disease.  Old RIGHT MCA territory infarct. Chronic calcified or ossified LEFT convexity meningioma, without changes of associated vasogenic edema in the cortex. Calcific cerebrovascular atherosclerotic disease. No acute intracranial findings.   Dg Chest Portable 1 View 09/26/2017 IMPRESSION:  Mild right lower lobe airspace disease and small right effusion.    Transthoracic Echocardiogram  09/27/2017 Study Conclusions - Left ventricle: The cavity size was normal. Wall thickness was   increased in a pattern of mild LVH. Systolic function was normal.   The estimated ejection fraction was in the range of 60% to 65%.   Wall motion was  normal; there were no regional wall motion   abnormalities. The study is not technically sufficient to allow   evaluation of LV diastolic function. - Mitral valve: Severely calcified annulus. Mildly thickened   leaflets . There was mild regurgitation. - Left atrium: The atrium was normal in size. Volume/bsa, ES,   (1-plane Simpson&'s, A2C): 24.3 ml/m^2.   Bilateral Carotid Dopplers -no significant extracranial stenosis     PHYSICAL EXAM Vitals:   09/28/17 0426 09/28/17 0803 09/28/17 0938 09/28/17 1043  BP:  139/65 (!) 157/89 (!) 111/53  Pulse:  92 66   Resp:   19   Temp: 97.7 F (36.5 C)  97.8 F (36.6 C)   TempSrc: Axillary  Oral   SpO2:   96%   Weight: 165 lb 2 oz (74.9 kg)     Height:      Pleasant elderly Caucasian lady not in distress. . Afebrile. Head is nontraumatic. Neck is supple without bruit.    Cardiac exam no murmur or gallop. Lungs are clear to auscultation. Distal pulses are well felt. Neurological Exam ;  Awake  Alert oriented x 3. Normal speech and language.eye movements full without nystagmus.fundi were not visualized. Vision acuity and fields appear normal. Hearing is normal. Palatal movements are normal. Face symmetric. Tongue midline. Normal strength, tone, reflexes and coordination. Normal sensation. Gait deferred.     ASSESSMENT/PLAN [name] is a 83 y.o. female with history of previous stroke, obesity, recently treated for right lower lobe pneumonia, hypertension, hyperlipidemia, and newly diagnosed atrial fibrillation presenting with transient speech difficulties. She did not receive IV t-PA due to resolution of deficits  Possible TIA:  Most likely embolic secondary to atrial fibrillation without anticoagulation.  Resultant -  resolution of deficits.  CT head - Old RIGHT MCA territory infarct.  MRI head - motion degraded. No obvious acute infarct.  MRA head - motion degraded. No high-grade stenosis noted.  Carotid Doppler -no  significant extracranial stenosis 2D Echo - EF 60-65%. No cardiac source of emboli identified.  LDL - 41  HgbA1c - 5.9   VTE prophylaxis - IV heparin Diet Heart Room service appropriate? Yes; Fluid consistency: Thin Diet - low sodium heart healthy  clopidogrel 75 mg daily prior to admission, now on heparin IV  Patient counseled to be compliant with her antithrombotic medications  Ongoing aggressive stroke risk factor management  Therapy recommendations:  pending  Disposition:  Pending  Hypertension  Stable  Permissive hypertension (OK if < 220/120) but gradually normalize in 5-7 days  Long-term BP goal normotensive  Hyperlipidemia  Home meds:  Fenofibrate 54 mg daily continued.  LDL 41, goal < 70   Other Stroke Risk Factors  Advanced age  Former  cigarette smoker - quit  Obesity, Body mass index is 37.02 kg/m., recommend weight loss, diet and exercise as appropriate   Hx stroke/TIA  Atrial fibrillation   Other Active Problems  Atrial fibrillation -> cardiology consult   Suspected pneumonia - doxycycline  Mild leukocytosis  Anemia  Renal - BUN 27 ; creatinine 1.05   Plan / Recommendations  Per cardiology - change to Eliquis and oral diltiazem when taking appropriate.  Hospital day # 2    I have personally examined this patient, reviewed notes, independently viewed imaging studies, participated in medical decision making and plan of care.ROS completed by me personally and pertinent positives fully documented  I have made any additions or clarifications directly to the above note.  .  She presented with transient expressive language difficulties in the setting of new onset atrial fibrillation likely left hemispheric TIA.  She will need long-term anticoagulation with Eliquis.     Greater than 50% time during this 25-minute visit was spent on counseling and coordination of care about TIA, atrial fibrillation, discussion about risk reduction and bleeding  and answering questions. Discussed with patient and Dr. Wynelle Cleveland. Stroke team will sign off. Follow-up as an outpatient in stroke clinic in 6 weeks.  Antony Contras, MD Medical Director [facility] Pager: [phone] 09/28/2017 10:58 AM   To contact Stroke Continuity provider, please refer to [email or web address] After hours, contact General Neurology

## 2017-10-01 ENCOUNTER — Other Ambulatory Visit: Payer: Self-pay | Admitting: Family Medicine

## 2017-10-15 ENCOUNTER — Ambulatory Visit (INDEPENDENT_AMBULATORY_CARE_PROVIDER_SITE_OTHER): Payer: Medicare Other

## 2017-10-15 DIAGNOSIS — M810 Age-related osteoporosis without current pathological fracture: Secondary | ICD-10-CM | POA: Diagnosis not present

## 2017-10-16 DIAGNOSIS — M810 Age-related osteoporosis without current pathological fracture: Secondary | ICD-10-CM | POA: Diagnosis not present

## 2017-10-16 MED ORDER — DENOSUMAB 60 MG/ML ~~LOC~~ SOLN
60.0000 mg | Freq: Once | SUBCUTANEOUS | Status: AC
  Administered 2017-10-16: 60 mg via SUBCUTANEOUS

## 2017-10-19 ENCOUNTER — Other Ambulatory Visit: Payer: Self-pay | Admitting: Family Medicine

## 2017-10-20 ENCOUNTER — Telehealth: Payer: Self-pay | Admitting: Family Medicine

## 2017-10-20 MED ORDER — APIXABAN 5 MG PO TABS: 5.0000 mg | ORAL_TABLET | Freq: Two times a day (BID) | ORAL | 0 refills | Status: DC

## 2017-10-20 NOTE — Telephone Encounter (Signed)
Dr Elease Hashimoto reviewed the message below and approved a 30-day suppl of Eliquis and this was sent to the pts pharmacy.

## 2017-10-20 NOTE — Telephone Encounter (Signed)
See request for Eliquis below /

## 2017-10-20 NOTE — Telephone Encounter (Signed)
Copied from [place] [id number]. Topic: Quick Communication - Rx Refill/Question >> Oct 20, 2017 11:00 AM Scherrie Gerlach wrote: [facility] Sous the caregiver called to advise pt was in the hospital the day after thanksgiving and hospital changed her meds. Hospital put her on apixaban (ELIQUIS) 5 MG TABS tablet.  This was dnied by Dr Maudie Mercury because she doesn't know about this change [facility] stating this need to be refilled.  [facility] states in the hospital, they dc'd her plavix, and replaced with Eliquis  Pt has scheduled a hospital fup for 0108/2019 . (But will need the eliquis before then.)  apixaban (ELIQUIS) 5 MG TABS tablet (BID) [id number] [address], [place] [phone] (Phone) [phone] (Fax)

## 2017-11-04 ENCOUNTER — Encounter: Payer: Self-pay | Admitting: Family Medicine

## 2017-11-04 ENCOUNTER — Ambulatory Visit (INDEPENDENT_AMBULATORY_CARE_PROVIDER_SITE_OTHER): Payer: Medicare Other | Admitting: Family Medicine

## 2017-11-04 VITALS — BP 120/70 | HR 83 | Temp 98.2°F | Ht <= 58 in | Wt 164.6 lb

## 2017-11-04 DIAGNOSIS — I10 Essential (primary) hypertension: Secondary | ICD-10-CM

## 2017-11-04 DIAGNOSIS — I4891 Unspecified atrial fibrillation: Secondary | ICD-10-CM | POA: Diagnosis not present

## 2017-11-04 DIAGNOSIS — Z8673 Personal history of transient ischemic attack (TIA), and cerebral infarction without residual deficits: Secondary | ICD-10-CM

## 2017-11-04 DIAGNOSIS — D649 Anemia, unspecified: Secondary | ICD-10-CM | POA: Diagnosis not present

## 2017-11-04 DIAGNOSIS — R600 Localized edema: Secondary | ICD-10-CM | POA: Diagnosis not present

## 2017-11-04 DIAGNOSIS — E785 Hyperlipidemia, unspecified: Secondary | ICD-10-CM

## 2017-11-04 DIAGNOSIS — J181 Lobar pneumonia, unspecified organism: Secondary | ICD-10-CM | POA: Diagnosis not present

## 2017-11-04 DIAGNOSIS — J189 Pneumonia, unspecified organism: Secondary | ICD-10-CM

## 2017-11-04 NOTE — Progress Notes (Signed)
HPI:  Charlene Wade is a pleasant 84 year old with past medical history significant for hypertension, hyperlipidemia, lower extremity edema, morbid obesity and depression following-up after a hospitalization a little over a month ago.  Per daughter and review of discharge notes, hospitalized for slurred speech, atrial fib with RVR. Echo with LVH, mitral valvular dz.  Neurology and cardiology were consulted.  She was discharged with addition of diltiazem and Eliquis.  Plavix was stopped.  She was to follow-up with cardiologist after 2 weeks, but nobody called her or give her this information.  They also did not know that they were supposed to follow-up with a neurologist. MRI/MRA severely motion degraded per report. Mild CA stenosis on duplex. They were busy with the holidays, so did not get to a hospital follow-up here until now.  Reports she has been doing okay.  Reports she had some mild GI bleeding initially when she left the hospital, but none in the last several weeks.  Denies any chest pain, shortness of breath or palpitations.  They have many questions about what could have possibly caused this.  Denies any speech issues, weakness, cough, SOB, DOE, fevers, numbness, headaches, paresthesias or other neurological problems since her discharge.   ROS: See pertinent positives and negatives per HPI.  Past Medical History:  Diagnosis Date  . Allergic rhinitis   . BCC (basal cell carcinoma of skin) 04/10/2015   sees dermatologist  . Depression 10/17/2012  . Ectopic pregnancy   . GERD (gastroesophageal reflux disease)    hx hiatal hernia, has seen GI, ENT and allergist in the past  . Hyperlipidemia   . Hypertension   . OA (osteoarthritis)    knees and back, hx DDD, s/p remote lumbar disc surgery  . OAB (overactive bladder) 04/10/2015  . Obesity   . Osteoporosis    on prolia in the past  . Stroke [facility])    sees neurologist, hx memory loss, expressive aphasia  . Venous insufficiency      Past Surgical History:  Procedure Laterality Date  . ABDOMINAL HYSTERECTOMY    . APPENDECTOMY    . BREAST REDUCTION SURGERY    . LAPAROSCOPIC SALPINGOOPHERECTOMY    . NASAL MASS EXCISION     Basal Cell Cancer Excision  . NASAL RECONSTRUCTION    . TEE WITHOUT CARDIOVERSION  10/22/2012   Procedure: TRANSESOPHAGEAL ECHOCARDIOGRAM (TEE);  Surgeon: Josue Hector, MD;  Location: [facility] ENDOSCOPY;  Service: Cardiovascular;  Laterality: N/A;    Family History  Problem Relation Age of Onset  . Hypertension Father   . Hypertension Mother   . Cancer - Lung Brother     Social History   Socioeconomic History  . Marital status: Widowed    Spouse name: None  . Number of children: 2  . Years of education: 77  . Highest education level: None  Social Needs  . Financial resource strain: None  . Food insecurity - worry: None  . Food insecurity - inability: None  . Transportation needs - medical: None  . Transportation needs - non-medical: None  Occupational History  . Occupation: retired  Tobacco Use  . Smoking status: Former Research scientist (life sciences)  . Smokeless tobacco: Never Used  Substance and Sexual Activity  . Alcohol use: No  . Drug use: No  . Sexual activity: No  Other Topics Concern  . None  Social History Narrative   Patient is single with 2 children.   Patient is right handed.   Patient has 12 th grade education.  Patient does not drink caffeine.     Current Outpatient Medications:  .  acetaminophen (TYLENOL) 500 MG tablet, Take 500 mg by mouth 2 (two) times daily as needed for headache (pain). , Disp: , Rfl:  .  apixaban (ELIQUIS) 5 MG TABS tablet, Take 1 tablet (5 mg total) by mouth 2 (two) times daily., Disp: 60 tablet, Rfl: 0 .  carvedilol (COREG) 3.125 MG tablet, take 1 tablet by mouth twice a day with food, Disp: 60 tablet, Rfl: 0 .  cetirizine (ZYRTEC) 10 MG tablet, Take 10 mg by mouth at bedtime. , Disp: , Rfl:  .  cholecalciferol (VITAMIN D) 1000 units tablet, Take 2,000  Units by mouth 2 (two) times daily., Disp: , Rfl:  .  Cyanocobalamin (VITAMIN B12 PO), Take 1 tablet by mouth daily. , Disp: , Rfl:  .  denosumab (PROLIA) 60 MG/ML SOLN injection, Inject 60 mg into the skin every 6 (six) months. Administer in upper arm, thigh, or abdomen (last injection June or July 2018), Disp: , Rfl:  .  diclofenac sodium (VOLTAREN) 1 % GEL, Apply 1 application topically 4 (four) times daily as needed (arm pain/bursitis)., Disp: , Rfl:  .  diltiazem (CARDIZEM CD) 240 MG 24 hr capsule, take 1 capsule by mouth once daily, Disp: 30 capsule, Rfl: 0 .  DULoxetine (CYMBALTA) 20 MG capsule, Take 1 capsule (20 mg total) by mouth daily., Disp: 30 capsule, Rfl: 3 .  fenofibrate (TRICOR) 145 MG tablet, take 1 tablet by mouth once daily (Patient taking differently: take 1 tablet (145 mg) by mouth once daily), Disp: 90 tablet, Rfl: 1 .  fluticasone (FLONASE) 50 MCG/ACT nasal spray, instill 1 spray into each nostril twice a day, Disp: 16 g, Rfl: 3 .  furosemide (LASIX) 20 MG tablet, Take 1 tablet (20 mg total) by mouth daily as needed. Use only as needed for severe leg swelling., Disp: 30 tablet, Rfl: 3 .  losartan (COZAAR) 50 MG tablet, take 1 tablet by mouth once daily (Patient taking differently: take 1 tablet (50 mg) by mouth once daily), Disp: 90 tablet, Rfl: 1 .  Multiple Vitamins-Minerals (PRESERVISION AREDS PO), Take 1 tablet by mouth 3 (three) times daily. , Disp: , Rfl:  .  oxybutynin (DITROPAN-XL) 5 MG 24 hr tablet, take 1 tablet by mouth once daily (Patient taking differently: take 1 tablet (5 mg) by mouth once daily at bedtime), Disp: 90 tablet, Rfl: 1 .  pantoprazole (PROTONIX) 20 MG tablet, take 1 tablet by mouth once daily (Patient taking differently: take 1 tablet (20 mg) by mouth once daily), Disp: 90 tablet, Rfl: 1 .  Polyethyl Glycol-Propyl Glycol (SYSTANE OP), Place 1 drop into both eyes 2 (two) times daily as needed (dry eyes)., Disp: , Rfl:   EXAM:  Vitals:   11/04/17  1506  BP: 120/70  Pulse: 83  Temp: 98.2 F (36.8 C)  SpO2: 97%    Body mass index is 36.9 kg/m.  GENERAL: vitals reviewed and listed above, alert, oriented, appears well hydrated and in no acute distress  HEENT: atraumatic, conjunttiva clear, no obvious abnormalities on inspection of external nose and ears  NECK: no obvious masses on inspection  LUNGS: clear to auscultation bilaterally, no wheezes, rales or rhonchi, good air movement  CV: HR irr irr, 1+ ankle edema  MS: moves all extremities without noticeable abnormality  PSYCH: pleasant and cooperative, no obvious depression or anxiety  ASSESSMENT AND PLAN:  Discussed the following assessment and plan:  New onset a-fib (  [place]) - Plan: Basic metabolic panel, CBC, Ambulatory referral to Cardiology  H/O: CVA (cerebrovascular accident) - Plan: Ambulatory referral to Cardiology  Essential hypertension - Plan: Ambulatory referral to Cardiology  Hyperlipidemia, unspecified hyperlipidemia type - Plan: Ambulatory referral to Cardiology  Leg edema - Plan: Ambulatory referral to Cardiology  Community acquired pneumonia of right lower lobe of lung ([place])  -reviewed medication changes -referral to cardiology for their cardiology follow up -may need neurology follow up as well if cardiology feels warrented -labs per orders -return/er precautions -pneumonia symptoms resolved -Patient advised to return or notify a doctor immediately if symptoms worsen or persist or new concerns arise.  Patient Instructions  BEFORE YOU LEAVE: -labs -follow up: 3-4 months  -We placed a referral for you as discussed to the cardiologist. It usually takes about 1-2 weeks to process and schedule this referral. If you have not heard from [place] regarding this appointment in 2 weeks please contact our office.     Colin Benton R., DO

## 2017-11-04 NOTE — Patient Instructions (Signed)
BEFORE YOU LEAVE: -labs -follow up: 3-4 months  -We placed a referral for you as discussed to the cardiologist. It usually takes about 1-2 weeks to process and schedule this referral. If you have not heard from [place] regarding this appointment in 2 weeks please contact our office.

## 2017-11-05 LAB — CBC
HCT: 34.8 % — ABNORMAL LOW (ref 36.0–46.0)
Hemoglobin: 10.9 g/dL — ABNORMAL LOW (ref 12.0–15.0)
MCHC: 31.4 g/dL (ref 30.0–36.0)
MCV: 91.2 fl (ref 78.0–100.0)
PLATELETS: 362 10*3/uL (ref 150.0–400.0)
RBC: 3.82 Mil/uL — ABNORMAL LOW (ref 3.87–5.11)
RDW: 16.2 % — AB (ref 11.5–15.5)
WBC: 7.9 10*3/uL (ref 4.0–10.5)

## 2017-11-05 LAB — BASIC METABOLIC PANEL
BUN: 19 mg/dL (ref 6–23)
CALCIUM: 8.6 mg/dL (ref 8.4–10.5)
CO2: 29 meq/L (ref 19–32)
CREATININE: 1.17 mg/dL (ref 0.40–1.20)
Chloride: 105 mEq/L (ref 96–112)
GFR: 46.41 mL/min — ABNORMAL LOW (ref >60.00)
GLUCOSE: 97 mg/dL (ref 70–99)
Potassium: 4.7 mEq/L (ref 3.5–5.1)
SODIUM: 141 meq/L (ref 135–145)

## 2017-11-10 NOTE — Addendum Note (Signed)
Addended by: Agnes Lawrence on: 11/10/2017 05:11 PM   Modules accepted: Orders

## 2017-11-14 ENCOUNTER — Telehealth: Payer: Self-pay | Admitting: Family Medicine

## 2017-11-14 NOTE — Telephone Encounter (Signed)
Copied from [place] [phone]. Topic: Quick Communication - See Telephone Encounter >> Nov 14, 2017  1:48 PM Vernona Rieger wrote: CRM for notification. See Telephone encounter for:   11/14/17.  Coughs a lot, needs help with the coughing per care giver.  She wants a nurse to call her back. Call back is [phone]

## 2017-11-15 ENCOUNTER — Other Ambulatory Visit: Payer: Self-pay | Admitting: Family Medicine

## 2017-11-17 ENCOUNTER — Other Ambulatory Visit (INDEPENDENT_AMBULATORY_CARE_PROVIDER_SITE_OTHER): Payer: Medicare Other

## 2017-11-17 DIAGNOSIS — D649 Anemia, unspecified: Secondary | ICD-10-CM | POA: Diagnosis not present

## 2017-11-17 LAB — POC HEMOCCULT BLD/STL (HOME/3-CARD/SCREEN)
Card #2 Fecal Occult Blod, POC: NEGATIVE
Card #3 Fecal Occult Blood, POC: NEGATIVE
FECAL OCCULT BLD: NEGATIVE

## 2017-11-17 NOTE — Telephone Encounter (Signed)
Need to listen to lungs, check O2, etc. Needs appt. Thanks.

## 2017-11-17 NOTE — Telephone Encounter (Signed)
I called [name] and scheduled an appt for tomorrow at 4pm.

## 2017-11-17 NOTE — Telephone Encounter (Signed)
I called Charlene Wade and she stated the pt still has a cough.  Stated the cough is non-productive since she was in the hospital for pneumonia, denies a fever or chills, nor runny nose or sore throat.  She also stated the pt tried Robitussin with no relief and questioned if Dr Maudie Mercury would give the pills she had previously for cough.  Message sent to Dr Maudie Mercury.

## 2017-11-17 NOTE — Telephone Encounter (Signed)
I left a message for [facility] to return my call.

## 2017-11-17 NOTE — Telephone Encounter (Signed)
Needs appt or triage. Thanks.

## 2017-11-18 ENCOUNTER — Encounter: Payer: Self-pay | Admitting: Family Medicine

## 2017-11-18 ENCOUNTER — Ambulatory Visit (INDEPENDENT_AMBULATORY_CARE_PROVIDER_SITE_OTHER): Payer: Medicare Other | Admitting: Family Medicine

## 2017-11-18 VITALS — BP 140/68 | HR 84 | Temp 97.7°F | Ht <= 58 in | Wt 166.6 lb

## 2017-11-18 DIAGNOSIS — I4891 Unspecified atrial fibrillation: Secondary | ICD-10-CM | POA: Diagnosis not present

## 2017-11-18 DIAGNOSIS — R059 Cough, unspecified: Secondary | ICD-10-CM

## 2017-11-18 DIAGNOSIS — R600 Localized edema: Secondary | ICD-10-CM | POA: Diagnosis not present

## 2017-11-18 DIAGNOSIS — R05 Cough: Secondary | ICD-10-CM | POA: Diagnosis not present

## 2017-11-18 DIAGNOSIS — L03116 Cellulitis of left lower limb: Secondary | ICD-10-CM | POA: Diagnosis not present

## 2017-11-18 DIAGNOSIS — I10 Essential (primary) hypertension: Secondary | ICD-10-CM

## 2017-11-18 MED ORDER — BENZONATATE 100 MG PO CAPS: 100.0000 mg | ORAL_CAPSULE | Freq: Two times a day (BID) | ORAL | 0 refills | Status: DC | PRN

## 2017-11-18 MED ORDER — DOXYCYCLINE HYCLATE 100 MG PO TABS: 100.0000 mg | ORAL_TABLET | Freq: Two times a day (BID) | ORAL | 0 refills | Status: DC

## 2017-11-18 NOTE — Patient Instructions (Addendum)
BEFORE YOU LEAVE: -cardiology appointment information -follow up: 1 month  Start the antibiotic for 3-5 days for the leg infection.  Elevated legs for 30 minutes twice daily.  Increase your lasix to a double dose for 3 days.  Tessalon for cough as needed. Let me know if you want to see the lung specialist about this.  I hope you are feeling better soon! Seek care promptly if your symptoms worsen, new concerns arise or you are not improving with treatment.

## 2017-11-18 NOTE — Progress Notes (Signed)
HPI:  Charlene Wade is a pleasant 84 year old here for an acute visit for several issues.  First of all, she has a cough that she has had for as long as she can remember.  She has a sensation of postnasal drip and then she tries to clear this out by coughing.  She reports she has seen allergist, ear nose and throat doctors and gastroenterologist in the past about this cough, but nobody has been able to help her.  She takes a proton pump inhibitor, Flonase and an antihistamine.  She does not think this is any worse than in the past.  It is simply annoying to her. Second of all, her daughter reports she has not been good about taking her Lasix on a regular basis or elevating her legs.  She has had some increased swelling over the last week with some redness and warmth in the left lower extremity.  She has a history of cellulitis in this leg.  Denies fevers, chills, shortness of breath, malaise or using. Third, they were supposed to see a cardiologist about her atrial fibrillation, but they have not been contacted about that appointment.  ROS: See pertinent positives and negatives per HPI.  Past Medical History:  Diagnosis Date  . Allergic rhinitis   . BCC (basal cell carcinoma of skin) 04/10/2015   sees dermatologist  . Depression 10/17/2012  . Ectopic pregnancy   . GERD (gastroesophageal reflux disease)    hx hiatal hernia, has seen GI, ENT and allergist in the past  . Hyperlipidemia   . Hypertension   . OA (osteoarthritis)    knees and back, hx DDD, s/p remote lumbar disc surgery  . OAB (overactive bladder) 04/10/2015  . Obesity   . Osteoporosis    on prolia in the past  . Stroke [facility])    sees neurologist, hx memory loss, expressive aphasia  . Venous insufficiency     Past Surgical History:  Procedure Laterality Date  . ABDOMINAL HYSTERECTOMY    . APPENDECTOMY    . BREAST REDUCTION SURGERY    . LAPAROSCOPIC SALPINGOOPHERECTOMY    . NASAL MASS EXCISION     Basal Cell Cancer  Excision  . NASAL RECONSTRUCTION    . TEE WITHOUT CARDIOVERSION  10/22/2012   Procedure: TRANSESOPHAGEAL ECHOCARDIOGRAM (TEE);  Surgeon: Josue Hector, MD;  Location: [facility] ENDOSCOPY;  Service: Cardiovascular;  Laterality: N/A;    Family History  Problem Relation Age of Onset  . Hypertension Father   . Hypertension Mother   . Cancer - Lung Brother     Social History   Socioeconomic History  . Marital status: Widowed    Spouse name: None  . Number of children: 2  . Years of education: 78  . Highest education level: None  Social Needs  . Financial resource strain: None  . Food insecurity - worry: None  . Food insecurity - inability: None  . Transportation needs - medical: None  . Transportation needs - non-medical: None  Occupational History  . Occupation: retired  Tobacco Use  . Smoking status: Former Research scientist (life sciences)  . Smokeless tobacco: Never Used  Substance and Sexual Activity  . Alcohol use: No  . Drug use: No  . Sexual activity: No  Other Topics Concern  . None  Social History Narrative   Patient is single with 2 children.   Patient is right handed.   Patient has 12 th grade education.   Patient does not drink caffeine.     Current  Outpatient Medications:  .  acetaminophen (TYLENOL) 500 MG tablet, Take 500 mg by mouth 2 (two) times daily as needed for headache (pain). , Disp: , Rfl:  .  apixaban (ELIQUIS) 5 MG TABS tablet, Take 1 tablet (5 mg total) by mouth 2 (two) times daily., Disp: 60 tablet, Rfl: 0 .  carvedilol (COREG) 3.125 MG tablet, take 1 tablet by mouth twice a day with food, Disp: 60 tablet, Rfl: 0 .  cetirizine (ZYRTEC) 10 MG tablet, Take 10 mg by mouth at bedtime. , Disp: , Rfl:  .  cholecalciferol (VITAMIN D) 1000 units tablet, Take 2,000 Units by mouth 2 (two) times daily., Disp: , Rfl:  .  Cyanocobalamin (VITAMIN B12 PO), Take 1 tablet by mouth daily. , Disp: , Rfl:  .  denosumab (PROLIA) 60 MG/ML SOLN injection, Inject 60 mg into the skin every 6 (six)  months. Administer in upper arm, thigh, or abdomen (last injection June or July 2018), Disp: , Rfl:  .  diclofenac sodium (VOLTAREN) 1 % GEL, Apply 1 application topically 4 (four) times daily as needed (arm pain/bursitis)., Disp: , Rfl:  .  diltiazem (CARDIZEM CD) 240 MG 24 hr capsule, take 1 capsule by mouth once daily, Disp: 30 capsule, Rfl: 0 .  DULoxetine (CYMBALTA) 20 MG capsule, Take 1 capsule (20 mg total) by mouth daily., Disp: 30 capsule, Rfl: 3 .  fenofibrate (TRICOR) 145 MG tablet, take 1 tablet by mouth once daily, Disp: 90 tablet, Rfl: 1 .  fluticasone (FLONASE) 50 MCG/ACT nasal spray, instill 1 spray into each nostril twice a day, Disp: 16 g, Rfl: 3 .  furosemide (LASIX) 20 MG tablet, Take 1 tablet (20 mg total) by mouth daily as needed. Use only as needed for severe leg swelling., Disp: 30 tablet, Rfl: 3 .  losartan (COZAAR) 50 MG tablet, take 1 tablet by mouth once daily (Patient taking differently: take 1 tablet (50 mg) by mouth once daily), Disp: 90 tablet, Rfl: 1 .  Multiple Vitamins-Minerals (PRESERVISION AREDS PO), Take 1 tablet by mouth 3 (three) times daily. , Disp: , Rfl:  .  oxybutynin (DITROPAN-XL) 5 MG 24 hr tablet, take 1 tablet by mouth once daily (Patient taking differently: take 1 tablet (5 mg) by mouth once daily at bedtime), Disp: 90 tablet, Rfl: 1 .  pantoprazole (PROTONIX) 20 MG tablet, take 1 tablet by mouth once daily (Patient taking differently: take 1 tablet (20 mg) by mouth once daily), Disp: 90 tablet, Rfl: 1 .  Polyethyl Glycol-Propyl Glycol (SYSTANE OP), Place 1 drop into both eyes 2 (two) times daily as needed (dry eyes)., Disp: , Rfl:  .  benzonatate (TESSALON) 100 MG capsule, Take 1 capsule (100 mg total) by mouth 2 (two) times daily as needed for cough., Disp: 20 capsule, Rfl: 0 .  doxycycline (VIBRA-TABS) 100 MG tablet, Take 1 tablet (100 mg total) by mouth 2 (two) times daily., Disp: 10 tablet, Rfl: 0  EXAM:  Vitals:   11/18/17 1550  BP: 140/68   Pulse: 84  Temp: 97.7 F (36.5 C)  SpO2: 97%    Body mass index is 37.35 kg/m.  GENERAL: vitals reviewed and listed above, alert, oriented, appears well hydrated and in no acute distress  HEENT: atraumatic, conjunttiva clear, no obvious abnormalities on inspection of external nose and ears  NECK: no obvious masses on inspection  LUNGS: clear to auscultation bilaterally, no wheezes, rales or rhonchi, good air movement  CV: irr irr, 1-2+ LE edema bilat with erythema and  warmth skin ant L LE  MS: moves all extremities without noticeable abnormality, but walks with a walker  PSYCH: pleasant and cooperative, no obvious depression or anxiety  ASSESSMENT AND PLAN:  Discussed the following assessment and plan:  Cough  Leg edema  Cellulitis of left lower extremity  New onset a-fib ([place])  Essential hypertension  -we discussed possible serious and likely etiologies for chronic cough, workup and treatment, treatment risks and return precautions - offered pulm referral for further discussion, consider stopping losartan - though bp a little elevated, tessalon after discussion risks - she requests this -keflex, elevation for the cellulitis and LE edema - poor compliance with recs, consider lymphedema clinic -appt with cardiology details provided -of course, we advised [place]  to return or notify a doctor immediately if symptoms worsen or persist or new concerns arise.  Patient Instructions  BEFORE YOU LEAVE: -cardiology appointment information -follow up: 1 month  Start the antibiotic for 3-5 days for the leg infection.  Elevated legs for 30 minutes twice daily.  Increase your lasix to a double dose for 3 days.  Tessalon for cough as needed. Let me know if you want to see the lung specialist about this.  I hope you are feeling better soon! Seek care promptly if your symptoms worsen, new concerns arise or you are not improving with treatment.     Lucretia Kern, DO

## 2017-11-21 ENCOUNTER — Other Ambulatory Visit: Payer: Self-pay | Admitting: Family Medicine

## 2017-11-21 ENCOUNTER — Ambulatory Visit: Payer: Medicare Other | Admitting: Cardiovascular Disease

## 2017-11-21 DIAGNOSIS — R221 Localized swelling, mass and lump, neck: Secondary | ICD-10-CM | POA: Diagnosis not present

## 2017-11-24 ENCOUNTER — Ambulatory Visit: Payer: Medicare Other | Admitting: Family Medicine

## 2017-11-24 NOTE — Telephone Encounter (Signed)
Mechele Claude, please make sure she is set up to see the cardiologist.  Faythe Ghee to refill times 1 month in the interim.

## 2017-11-24 NOTE — Telephone Encounter (Signed)
Patient has an appt with Dr Johnsie Cancel on 3/1.  Refill sent to the pts pharmacy.

## 2017-11-25 ENCOUNTER — Ambulatory Visit: Payer: Medicare Other | Admitting: Family Medicine

## 2017-12-05 ENCOUNTER — Other Ambulatory Visit: Payer: Self-pay | Admitting: Family Medicine

## 2017-12-18 NOTE — Progress Notes (Deleted)
Cardiology Office Note   Date:  12/18/2017   ID:  Charlene Wade, DOB 1928-05-04, MRN [id number]  PCP:  Lucretia Kern, DO  Cardiologist:   [name], MD   No chief complaint on file.     History of Present Illness: Charlene Wade is a 84 y.o. female who presents for consultation regarding afib. Referred by Colin Benton DO Reviewed her office note from 11/18/17 She has chronic cough and is on Flonase, PPI and antihistamine LE edema And not compliant with her lasix. Given Keflex for cellulitis Tessalon for cough and offered pulmonary referral and  To stop ARB     Past Medical History:  Diagnosis Date  . Allergic rhinitis   . BCC (basal cell carcinoma of skin) 04/10/2015   sees dermatologist  . Depression 10/17/2012  . Ectopic pregnancy   . GERD (gastroesophageal reflux disease)    hx hiatal hernia, has seen GI, ENT and allergist in the past  . Hyperlipidemia   . Hypertension   . OA (osteoarthritis)    knees and back, hx DDD, s/p remote lumbar disc surgery  . OAB (overactive bladder) 04/10/2015  . Obesity   . Osteoporosis    on prolia in the past  . Stroke [facility])    sees neurologist, hx memory loss, expressive aphasia  . Venous insufficiency     Past Surgical History:  Procedure Laterality Date  . ABDOMINAL HYSTERECTOMY    . APPENDECTOMY    . BREAST REDUCTION SURGERY    . LAPAROSCOPIC SALPINGOOPHERECTOMY    . NASAL MASS EXCISION     Basal Cell Cancer Excision  . NASAL RECONSTRUCTION    . TEE WITHOUT CARDIOVERSION  10/22/2012   Procedure: TRANSESOPHAGEAL ECHOCARDIOGRAM (TEE);  Surgeon: Josue Hector, MD;  Location: [facility] ENDOSCOPY;  Service: Cardiovascular;  Laterality: N/A;     Current Outpatient Medications  Medication Sig Dispense Refill  . acetaminophen (TYLENOL) 500 MG tablet Take 500 mg by mouth 2 (two) times daily as needed for headache (pain).     . benzonatate (TESSALON) 100 MG capsule Take 1 capsule (100 mg total) by mouth 2 (two) times daily as  needed for cough. 20 capsule 0  . carvedilol (COREG) 3.125 MG tablet take 1 tablet by mouth twice a day with food 60 tablet 5  . cetirizine (ZYRTEC) 10 MG tablet Take 10 mg by mouth at bedtime.     . cholecalciferol (VITAMIN D) 1000 units tablet Take 2,000 Units by mouth 2 (two) times daily.    . Cyanocobalamin (VITAMIN B12 PO) Take 1 tablet by mouth daily.     Marland Kitchen denosumab (PROLIA) 60 MG/ML SOLN injection Inject 60 mg into the skin every 6 (six) months. Administer in upper arm, thigh, or abdomen (last injection June or July 2018)    . diclofenac sodium (VOLTAREN) 1 % GEL Apply 1 application topically 4 (four) times daily as needed (arm pain/bursitis).    Marland Kitchen diltiazem (CARDIZEM CD) 240 MG 24 hr capsule take 1 capsule by mouth once daily 30 capsule 5  . doxycycline (VIBRA-TABS) 100 MG tablet Take 1 tablet (100 mg total) by mouth 2 (two) times daily. 10 tablet 0  . DULoxetine (CYMBALTA) 20 MG capsule take 1 capsule by mouth once daily 30 capsule 5  . ELIQUIS 5 MG TABS tablet take 1 tablet by mouth twice a day 60 tablet 0  . fenofibrate (TRICOR) 145 MG tablet take 1 tablet by mouth once daily 90 tablet 1  .  fluticasone (FLONASE) 50 MCG/ACT nasal spray instill 1 spray into each nostril twice a day 16 g 3  . furosemide (LASIX) 20 MG tablet Take 1 tablet (20 mg total) by mouth daily as needed. Use only as needed for severe leg swelling. 30 tablet 3  . losartan (COZAAR) 50 MG tablet take 1 tablet by mouth once daily (Patient taking differently: take 1 tablet (50 mg) by mouth once daily) 90 tablet 1  . Multiple Vitamins-Minerals (PRESERVISION AREDS PO) Take 1 tablet by mouth 3 (three) times daily.     Marland Kitchen oxybutynin (DITROPAN-XL) 5 MG 24 hr tablet take 1 tablet by mouth once daily (Patient taking differently: take 1 tablet (5 mg) by mouth once daily at bedtime) 90 tablet 1  . pantoprazole (PROTONIX) 20 MG tablet take 1 tablet by mouth once daily 90 tablet 1  . Polyethyl Glycol-Propyl Glycol (SYSTANE OP) Place  1 drop into both eyes 2 (two) times daily as needed (dry eyes).     No current facility-administered medications for this visit.     Allergies:   Levaquin [levofloxacin]; Ace inhibitors; Codeine; Penicillins; Sulfa antibiotics; and Sulfasalazine    Social History:  The patient  reports that she has quit smoking. she has never used smokeless tobacco. She reports that she does not drink alcohol or use drugs.   Family History:  The patient's family history includes Cancer - Lung in her brother; Hypertension in her father and mother.    ROS:  Please see the history of present illness.   Otherwise, review of systems are positive for {NONE DEFAULTED:18576::"none"}.   All other systems are reviewed and negative.    PHYSICAL EXAM: VS:  There were no vitals taken for this visit. , BMI There is no height or weight on file to calculate BMI. Affect appropriate Healthy:  appears stated age 32: normal Neck supple with no adenopathy JVP normal no bruits no thyromegaly Lungs clear with no wheezing and good diaphragmatic motion Heart:  S1/S2 no murmur, no rub, gallop or click PMI normal Abdomen: benighn, BS positve, no tenderness, no AAA no bruit.  No HSM or HJR Distal pulses intact with no bruits No edema Neuro non-focal Skin warm and dry No muscular weakness    EKG:  ***   Recent Labs: 09/26/2017: ALT 9 09/27/2017: TSH 0.438 11/04/2017: BUN 19; Creatinine, Ser 1.17; Hemoglobin 10.9; Platelets 362.0; Potassium 4.7; Sodium 141    Lipid Panel    Component Value Date/Time   CHOL 103 09/27/2017 0151   TRIG 124 09/27/2017 0151   HDL 37 (L) 09/27/2017 0151   CHOLHDL 2.8 09/27/2017 0151   VLDL 25 09/27/2017 0151   LDLCALC 41 09/27/2017 0151      Wt Readings from Last 3 Encounters:  11/18/17 166 lb 9.6 oz (75.6 kg)  11/04/17 164 lb 9.6 oz (74.7 kg)  09/28/17 165 lb 2 oz (74.9 kg)      Other studies Reviewed: Additional studies/ records that were reviewed today include:  ***.    ASSESSMENT AND PLAN:  1.  ***   Current medicines are reviewed at length with the patient today.  The patient {ACTIONS; HAS/DOES NOT HAVE:19233} concerns regarding medicines.  The following changes have been made:  {PLAN; NO CHANGE:13088:s}  Labs/ tests ordered today include: *** No orders of the defined types were placed in this encounter.    Disposition:   FU with ***     Signed, [name], MD  12/18/2017 6:01 PM      [facility] [address], [place], [place]  [address] Phone: [phone]; Fax: [phone]

## 2017-12-20 ENCOUNTER — Other Ambulatory Visit: Payer: Self-pay | Admitting: Family Medicine

## 2017-12-23 ENCOUNTER — Ambulatory Visit (INDEPENDENT_AMBULATORY_CARE_PROVIDER_SITE_OTHER): Payer: Medicare Other | Admitting: Family Medicine

## 2017-12-23 ENCOUNTER — Encounter: Payer: Self-pay | Admitting: Family Medicine

## 2017-12-23 VITALS — BP 124/50 | HR 82 | Temp 97.7°F | Ht <= 58 in | Wt 160.6 lb

## 2017-12-23 DIAGNOSIS — D649 Anemia, unspecified: Secondary | ICD-10-CM | POA: Diagnosis not present

## 2017-12-23 DIAGNOSIS — I4891 Unspecified atrial fibrillation: Secondary | ICD-10-CM

## 2017-12-23 DIAGNOSIS — R600 Localized edema: Secondary | ICD-10-CM

## 2017-12-23 DIAGNOSIS — F3341 Major depressive disorder, recurrent, in partial remission: Secondary | ICD-10-CM

## 2017-12-23 DIAGNOSIS — Z8673 Personal history of transient ischemic attack (TIA), and cerebral infarction without residual deficits: Secondary | ICD-10-CM | POA: Diagnosis not present

## 2017-12-23 DIAGNOSIS — E785 Hyperlipidemia, unspecified: Secondary | ICD-10-CM | POA: Diagnosis not present

## 2017-12-23 DIAGNOSIS — N183 Chronic kidney disease, stage 3 unspecified: Secondary | ICD-10-CM

## 2017-12-23 NOTE — Progress Notes (Signed)
HPI:  Charlene Wade is a pleasant 84 y.o. here for follow up. Chronic medical problems summarized below were reviewed for changes and stability and were updated as needed below. These issues and their treatment remain stable for the most part.  She had a decreased hemoglobin on her last check and we advised stool cards and a repeat CBC.  Stool cards were normal.  Denies bleeding, weakness, bowel changes.  She has been taking her Lasix on a more regular basis and using her compression socks and feels her leg is a little better.  She will be seeing her cardiologist in a few weeks.. Denies CP, SOB, DOE, treatment intolerance or new symptoms.   Atrial fibrillation, hypertension, chronic kidney disease, hyperlipidemia, history of CVA, lower extremity edema: -Sees cardiologist and neurology, has not been very good about follow-up -Has appointment with Dr. Alvester Chou in a few weeks -Medications include Coreg, diltiazem, Eliquis, TriCor, Lasix, losartan -She was developing some cellulitis in the lower extremity at her last visit needed a short course of an antibiotic  Chronic cough, acid reflux, postnasal drip, allergic rhinitis: -hx hiatal hernia, sees Dr. Senaida Ores -Chronic issue, has seen GI and ear nose and throat -Medications include pantoprazole 20 mg, Flonase, Zyrtec  Osteoporosis: -Sees Dr. Ethelene Hal, on Prolia and vitamin D  Depression, chronic pain: -Started Cymbalta in 2018 which has worked well for her  OOB: -Medications include oxybutynin  Anemia: -Appears to be fairly chronic, mild -Worsened after hospitalization in 2018 and advised stool cards  Obesity: -poor diet, no regular exercise  ROS: See pertinent positives and negatives per HPI.  Past Medical History:  Diagnosis Date  . Allergic rhinitis   . BCC (basal cell carcinoma of skin) 04/10/2015   sees dermatologist  . Depression 10/17/2012  . Ectopic pregnancy   . GERD (gastroesophageal reflux disease)    hx hiatal hernia,  has seen GI, ENT and allergist in the past  . Hyperlipidemia   . Hypertension   . OA (osteoarthritis)    knees and back, hx DDD, s/p remote lumbar disc surgery  . OAB (overactive bladder) 04/10/2015  . Obesity   . Osteoporosis    on prolia in the past  . Stroke [facility])    sees neurologist, hx memory loss, expressive aphasia  . Venous insufficiency     Past Surgical History:  Procedure Laterality Date  . ABDOMINAL HYSTERECTOMY    . APPENDECTOMY    . BREAST REDUCTION SURGERY    . LAPAROSCOPIC SALPINGOOPHERECTOMY    . NASAL MASS EXCISION     Basal Cell Cancer Excision  . NASAL RECONSTRUCTION    . TEE WITHOUT CARDIOVERSION  10/22/2012   Procedure: TRANSESOPHAGEAL ECHOCARDIOGRAM (TEE);  Surgeon: Josue Hector, MD;  Location: [facility] ENDOSCOPY;  Service: Cardiovascular;  Laterality: N/A;    Family History  Problem Relation Age of Onset  . Hypertension Father   . Hypertension Mother   . Cancer - Lung Brother     Social History   Socioeconomic History  . Marital status: Widowed    Spouse name: None  . Number of children: 2  . Years of education: 39  . Highest education level: None  Social Needs  . Financial resource strain: None  . Food insecurity - worry: None  . Food insecurity - inability: None  . Transportation needs - medical: None  . Transportation needs - non-medical: None  Occupational History  . Occupation: retired  Tobacco Use  . Smoking status: Former Research scientist (life sciences)  . Smokeless tobacco:  Never Used  Substance and Sexual Activity  . Alcohol use: No  . Drug use: No  . Sexual activity: No  Other Topics Concern  . None  Social History Narrative   Patient is single with 2 children.   Patient is right handed.   Patient has 12 th grade education.   Patient does not drink caffeine.     Current Outpatient Medications:  .  acetaminophen (TYLENOL) 500 MG tablet, Take 500 mg by mouth 2 (two) times daily as needed for headache (pain). , Disp: , Rfl:  .  carvedilol (COREG)  3.125 MG tablet, take 1 tablet by mouth twice a day with food, Disp: 60 tablet, Rfl: 5 .  cetirizine (ZYRTEC) 10 MG tablet, Take 10 mg by mouth at bedtime. , Disp: , Rfl:  .  cholecalciferol (VITAMIN D) 1000 units tablet, Take 2,000 Units by mouth 2 (two) times daily., Disp: , Rfl:  .  Cyanocobalamin (VITAMIN B12 PO), Take 1 tablet by mouth daily. , Disp: , Rfl:  .  denosumab (PROLIA) 60 MG/ML SOLN injection, Inject 60 mg into the skin every 6 (six) months. Administer in upper arm, thigh, or abdomen (last injection June or July 2018), Disp: , Rfl:  .  diclofenac sodium (VOLTAREN) 1 % GEL, Apply 1 application topically 4 (four) times daily as needed (arm pain/bursitis)., Disp: , Rfl:  .  diltiazem (CARDIZEM CD) 240 MG 24 hr capsule, take 1 capsule by mouth once daily, Disp: 30 capsule, Rfl: 5 .  DULoxetine (CYMBALTA) 20 MG capsule, take 1 capsule by mouth once daily, Disp: 30 capsule, Rfl: 5 .  ELIQUIS 5 MG TABS tablet, take 1 tablet by mouth twice a day, Disp: 60 tablet, Rfl: 0 .  fenofibrate (TRICOR) 145 MG tablet, take 1 tablet by mouth once daily, Disp: 90 tablet, Rfl: 1 .  fluticasone (FLONASE) 50 MCG/ACT nasal spray, instill 1 spray into each nostril twice a day, Disp: 16 g, Rfl: 3 .  furosemide (LASIX) 20 MG tablet, Take 1 tablet (20 mg total) by mouth daily as needed. Use only as needed for severe leg swelling., Disp: 30 tablet, Rfl: 3 .  losartan (COZAAR) 50 MG tablet, take 1 tablet by mouth once daily (Patient taking differently: take 1 tablet (50 mg) by mouth once daily), Disp: 90 tablet, Rfl: 1 .  Multiple Vitamins-Minerals (PRESERVISION AREDS PO), Take 1 tablet by mouth 3 (three) times daily. , Disp: , Rfl:  .  oxybutynin (DITROPAN-XL) 5 MG 24 hr tablet, take 1 tablet by mouth once daily (Patient taking differently: take 1 tablet (5 mg) by mouth once daily at bedtime), Disp: 90 tablet, Rfl: 1 .  pantoprazole (PROTONIX) 20 MG tablet, take 1 tablet by mouth once daily, Disp: 90 tablet, Rfl:  1 .  Polyethyl Glycol-Propyl Glycol (SYSTANE OP), Place 1 drop into both eyes 2 (two) times daily as needed (dry eyes)., Disp: , Rfl:   EXAM:  Vitals:   12/23/17 1548  BP: (!) 124/50  Pulse: 82  Temp: 97.7 F (36.5 C)    Body mass index is 36.01 kg/m.  GENERAL: vitals reviewed and listed above, alert, oriented, appears well hydrated and in no acute distress  HEENT: atraumatic, conjunttiva clear, no obvious abnormalities on inspection of external nose and ears  NECK: no obvious masses on inspection  LUNGS: clear to auscultation bilaterally, no wheezes, rales or rhonchi, good air movement  CV: HRRR, bilat ankle edema - improved, wearing compression  MS: moves all extremities without noticeable  abnormality  PSYCH: pleasant and cooperative, no obvious depression or anxiety  ASSESSMENT AND PLAN:  Discussed the following assessment and plan:  Leg edema  New onset a-fib (HCC)  Hyperlipidemia, unspecified hyperlipidemia type  H/O: CVA (cerebrovascular accident)  Anemia, unspecified type - Plan: CBC  Morbid obesity (HCC)  CKD (chronic kidney disease) stage 3, GFR 30-59 ml/min ([facility])  Recurrent major depressive disorder, in partial remission ([place])  -Lower extremity edema improving without any signs of cellulitis today -Recheck CBC, stool cards were negative, she sees a cardiologist in a few weeks -Encouraged a healthy diet and regular activity, continue elevation and compression -See PHQ 9, mood has improved -AWV and follow- up in 3-4 months -Patient advised to return or notify a doctor immediately if symptoms worsen or persist or new concerns arise.  Patient Instructions  BEFORE YOU LEAVE: -Lab to check blood counts -follow up: Follow-up with Dr. Maudie Mercury and annual wellness visit with Manuela Schwartz in 3-4 months  Continue to take the Lasix, elevate legs at least once daily for 30 minutes and use the compression socks on a regular basis.  Eat a healthy low sugar diet and try  to get as much regular activity as possible.  See your cardiologist as planned.    Lucretia Kern, DO

## 2017-12-23 NOTE — Patient Instructions (Addendum)
BEFORE YOU LEAVE: -Lab to check blood counts -follow up: Follow-up with Dr. Maudie Mercury and annual wellness visit with Manuela Schwartz in 3-4 months  Continue to take the Lasix, elevate legs at least once daily for 30 minutes and use the compression socks on a regular basis.  Eat a healthy low sugar diet and try to get as much regular activity as possible.  See your cardiologist as planned.

## 2017-12-24 LAB — CBC
HCT: 34.7 % — ABNORMAL LOW (ref 36.0–46.0)
HEMOGLOBIN: 11.2 g/dL — AB (ref 12.0–15.0)
MCHC: 32.2 g/dL (ref 30.0–36.0)
MCV: 87.3 fl (ref 78.0–100.0)
PLATELETS: 364 10*3/uL (ref 150.0–400.0)
RBC: 3.98 Mil/uL (ref 3.87–5.11)
RDW: 14.9 % (ref 11.5–15.5)
WBC: 7.9 10*3/uL (ref 4.0–10.5)

## 2017-12-26 ENCOUNTER — Other Ambulatory Visit: Payer: Self-pay | Admitting: Family Medicine

## 2017-12-26 ENCOUNTER — Ambulatory Visit: Payer: Medicare Other | Admitting: Cardiovascular Disease

## 2018-01-14 ENCOUNTER — Ambulatory Visit (INDEPENDENT_AMBULATORY_CARE_PROVIDER_SITE_OTHER): Payer: Medicare Other | Admitting: Cardiovascular Disease

## 2018-01-14 ENCOUNTER — Encounter: Payer: Self-pay | Admitting: Cardiovascular Disease

## 2018-01-14 VITALS — BP 158/64 | HR 89 | Ht <= 58 in | Wt 163.0 lb

## 2018-01-14 DIAGNOSIS — I4891 Unspecified atrial fibrillation: Secondary | ICD-10-CM | POA: Diagnosis not present

## 2018-01-14 DIAGNOSIS — E7800 Pure hypercholesterolemia, unspecified: Secondary | ICD-10-CM

## 2018-01-14 DIAGNOSIS — R600 Localized edema: Secondary | ICD-10-CM | POA: Diagnosis not present

## 2018-01-14 DIAGNOSIS — I10 Essential (primary) hypertension: Secondary | ICD-10-CM

## 2018-01-14 NOTE — Assessment & Plan Note (Signed)
Chronic, left greater than right on oral diuretics

## 2018-01-14 NOTE — Assessment & Plan Note (Signed)
New-onset A. Fib detected during her recent hospitalization 09/26/17 where Dr. Sallyanne Kuster consulted on her during that admission. It was elected to pursue a rate control strategy. She was placed on oral Cardizem in addition to Eliquis . She remains in A. Fib today with a ventricular response of 90.

## 2018-01-14 NOTE — Progress Notes (Signed)
01/14/2018 Charlene Wade   [birth year]  [id number]  Primary Physician Lucretia Kern, DO Primary Cardiologist: Lorretta Harp MD Garret Reddish, [place], [place]  HPI:  Charlene Wade is a 84 y.o.  moderately overweight widowed Caucasian female mother of one living child, grandmother and 2 grandchildren referred by her primary care physician Dr. Maudie Mercury and her orthopedist for cardiovascular evaluation because of lower extremity edema. I last saw her in the office 6/21/17She has a history of hypertension and hyperlipidemia. She did have a stroke 4 years ago and has left-sided weakness. She walks with a walker walker. She has had some dyspnea. She denies chest pain. She has never had a heart attack. She has chronic lower extremity edema probably contributed to by being on Norvasc She was recently hospitalized 09/26/17 for 3 days with new onset A. Fib with RVR. She was rate controlled and placed on Eliquis  oral anticoagulation. 2-D echo was normal. She is otherwise asymptomatic.   Current Meds  Medication Sig  . acetaminophen (TYLENOL) 500 MG tablet Take 500 mg by mouth 2 (two) times daily as needed for headache (pain).   . carvedilol (COREG) 3.125 MG tablet take 1 tablet by mouth twice a day with food  . cetirizine (ZYRTEC) 10 MG tablet Take 10 mg by mouth at bedtime.   . cholecalciferol (VITAMIN D) 1000 units tablet Take 2,000 Units by mouth 2 (two) times daily.  . Cyanocobalamin (VITAMIN B12 PO) Take 1 tablet by mouth daily.   Marland Kitchen denosumab (PROLIA) 60 MG/ML SOLN injection Inject 60 mg into the skin every 6 (six) months. Administer in upper arm, thigh, or abdomen (last injection June or July 2018)  . diclofenac sodium (VOLTAREN) 1 % GEL Apply 1 application topically 4 (four) times daily as needed (arm pain/bursitis).  Marland Kitchen diltiazem (CARDIZEM CD) 240 MG 24 hr capsule take 1 capsule by mouth once daily  . DULoxetine (CYMBALTA) 20 MG capsule take 1 capsule by mouth once daily  . ELIQUIS 5 MG TABS  tablet take 1 tablet by mouth twice a day  . fenofibrate (TRICOR) 145 MG tablet take 1 tablet by mouth once daily  . fluticasone (FLONASE) 50 MCG/ACT nasal spray instill 1 spray into each nostril twice a day  . furosemide (LASIX) 20 MG tablet Take 1 tablet (20 mg total) by mouth daily as needed. Use only as needed for severe leg swelling.  Marland Kitchen losartan (COZAAR) 50 MG tablet take 1 tablet by mouth once daily (Patient taking differently: take 1 tablet (50 mg) by mouth once daily)  . Multiple Vitamins-Minerals (PRESERVISION AREDS PO) Take 1 tablet by mouth 3 (three) times daily.   Marland Kitchen oxybutynin (DITROPAN-XL) 5 MG 24 hr tablet take 1 tablet by mouth once daily  . pantoprazole (PROTONIX) 20 MG tablet take 1 tablet by mouth once daily  . Polyethyl Glycol-Propyl Glycol (SYSTANE OP) Place 1 drop into both eyes 2 (two) times daily as needed (dry eyes).     Allergies  Allergen Reactions  . Levaquin [Levofloxacin] Other (See Comments)    Severe intermittent arm pain  . Ace Inhibitors Cough  . Codeine Other (See Comments)    Unknown reaction - didn't feel well?  . Penicillins Other (See Comments)    Caused high fever Has patient had a PCN reaction causing immediate rash, facial/tongue/throat swelling, SOB or lightheadedness with hypotension: No Has patient had a PCN reaction causing severe rash involving mucus membranes or skin necrosis: No Has patient had a  PCN reaction that required hospitalization: No Has patient had a PCN reaction occurring within the last 10 years: No If all of the above answers are "NO", then may proceed with Cephalosporin use.  . Sulfa Antibiotics Other (See Comments)    Possibly caused fever  . Sulfasalazine Other (See Comments)    Possibly caused fever    Social History   Socioeconomic History  . Marital status: Widowed    Spouse name: Not on file  . Number of children: 2  . Years of education: 28  . Highest education level: Not on file  Social Needs  . Financial  resource strain: Not on file  . Food insecurity - worry: Not on file  . Food insecurity - inability: Not on file  . Transportation needs - medical: Not on file  . Transportation needs - non-medical: Not on file  Occupational History  . Occupation: retired  Tobacco Use  . Smoking status: Former Research scientist (life sciences)  . Smokeless tobacco: Never Used  Substance and Sexual Activity  . Alcohol use: No  . Drug use: No  . Sexual activity: No  Other Topics Concern  . Not on file  Social History Narrative   Patient is single with 2 children.   Patient is right handed.   Patient has 12 th grade education.   Patient does not drink caffeine.     Review of Systems: General: negative for chills, fever, night sweats or weight changes.  Cardiovascular: negative for chest pain, dyspnea on exertion, edema, orthopnea, palpitations, paroxysmal nocturnal dyspnea or shortness of breath Dermatological: negative for rash Respiratory: negative for cough or wheezing Urologic: negative for hematuria Abdominal: negative for nausea, vomiting, diarrhea, bright red blood per rectum, melena, or hematemesis Neurologic: negative for visual changes, syncope, or dizziness All other systems reviewed and are otherwise negative except as noted above.    Blood pressure (!) 158/64, pulse 89, height 4\' 8"  (1.422 m), weight 163 lb (73.9 kg).  General appearance: alert and no distress Neck: no adenopathy, no carotid bruit, no JVD, supple, symmetrical, trachea midline and thyroid not enlarged, symmetric, no tenderness/mass/nodules Lungs: clear to auscultation bilaterally Heart: irregularly irregular rhythm Extremities: 1+ pitting edema bilaterally Pulses: 2+ and symmetric Skin: Skin color, texture, turgor normal. No rashes or lesions Neurologic: Alert and oriented X 3, normal strength and tone. Normal symmetric reflexes. Normal coordination and gait  EKG A. Fib with a ventricular response of 89, poor R-wave progression. I  personally reviewed this EKG.  ASSESSMENT AND PLAN:   Essential hypertension History of essential hypertension blood pressures measured at 158/64. She is on carvedilol, diltiazem and losartan. Continue current meds at current dosing.  Hyperlipidemia History of hyperlipidemia on fenofibratewith lipid profile performed 09/27/17 revealing total cholesterol 103, LDL 41 and HDL of 37.  Leg edema Chronic, left greater than right on oral diuretics  New onset a-fib (HCC) New-onset A. Fib detected during her recent hospitalization 09/26/17 where Dr. Sallyanne Kuster consulted on her during that admission. It was elected to pursue a rate control strategy. She was placed on oral Cardizem in addition to Eliquis . She remains in A. Fib today with a ventricular response of 90.      Lorretta Harp MD FACP,FACC,FAHA, [facility] 01/14/2018 3:49 PM

## 2018-01-14 NOTE — Assessment & Plan Note (Signed)
History of essential hypertension blood pressures measured at 158/64. She is on carvedilol, diltiazem and losartan. Continue current meds at current dosing.

## 2018-01-14 NOTE — Assessment & Plan Note (Signed)
History of hyperlipidemia on fenofibratewith lipid profile performed 09/27/17 revealing total cholesterol 103, LDL 41 and HDL of 37.

## 2018-01-14 NOTE — Patient Instructions (Signed)

## 2018-01-19 ENCOUNTER — Other Ambulatory Visit: Payer: Self-pay | Admitting: Family Medicine

## 2018-01-19 NOTE — Telephone Encounter (Signed)
Refills OK. 

## 2018-01-19 NOTE — Telephone Encounter (Signed)
Rx done. 

## 2018-02-06 ENCOUNTER — Other Ambulatory Visit: Payer: Self-pay | Admitting: Family Medicine

## 2018-02-17 ENCOUNTER — Encounter: Payer: Self-pay | Admitting: Family Medicine

## 2018-02-17 ENCOUNTER — Ambulatory Visit (INDEPENDENT_AMBULATORY_CARE_PROVIDER_SITE_OTHER): Payer: Medicare Other | Admitting: Family Medicine

## 2018-02-17 VITALS — BP 122/74 | HR 80 | Temp 97.6°F | Ht <= 58 in | Wt 160.0 lb

## 2018-02-17 DIAGNOSIS — M546 Pain in thoracic spine: Secondary | ICD-10-CM | POA: Diagnosis not present

## 2018-02-17 DIAGNOSIS — I4891 Unspecified atrial fibrillation: Secondary | ICD-10-CM | POA: Diagnosis not present

## 2018-02-17 DIAGNOSIS — G8929 Other chronic pain: Secondary | ICD-10-CM | POA: Diagnosis not present

## 2018-02-17 DIAGNOSIS — R600 Localized edema: Secondary | ICD-10-CM

## 2018-02-17 NOTE — Patient Instructions (Addendum)
BEFORE YOU LEAVE: -follow up:  -CANCEL any follow up currently scheduled -SCHEDULE AWV and follow up same day in about 3 months   Heat, topical menthol (such as tiger balm), tylenol 500-1000mg  up to 3 times daily as needed for pain. Move frequently. Follow up in 3-4 weeks if persists.   Continue compression socks. Elevate legs twice daily.

## 2018-02-17 NOTE — Progress Notes (Signed)
HPI:  Using dictation device. Unfortunately this device frequently misinterprets words/phrases.  Acute visit for:  L upper back pain: -back issues intermittently for many years -this started a few days ago -mid-lower thoracic back on the L, sharp pain, mod -no fevers, malaise -ok at times, used OTC topical treatment -can't think of injury  LE edema: -improving per daughter -using compression -does not elevated -mild redness L LE - she worries about infection  A. Fib/HTN: -finally saw cardiologist -BP doing better today -no symptoms  ROS: See pertinent positives and negatives per HPI.  Past Medical History:  Diagnosis Date  . Allergic rhinitis   . BCC (basal cell carcinoma of skin) 04/10/2015   sees dermatologist  . Depression 10/17/2012  . Ectopic pregnancy   . GERD (gastroesophageal reflux disease)    hx hiatal hernia, has seen GI, ENT and allergist in the past  . Hyperlipidemia   . Hypertension   . OA (osteoarthritis)    knees and back, hx DDD, s/p remote lumbar disc surgery  . OAB (overactive bladder) 04/10/2015  . Obesity   . Osteoporosis    on prolia in the past  . Stroke [facility])    sees neurologist, hx memory loss, expressive aphasia  . Venous insufficiency     Past Surgical History:  Procedure Laterality Date  . ABDOMINAL HYSTERECTOMY    . APPENDECTOMY    . BREAST REDUCTION SURGERY    . LAPAROSCOPIC SALPINGOOPHERECTOMY    . NASAL MASS EXCISION     Basal Cell Cancer Excision  . NASAL RECONSTRUCTION    . TEE WITHOUT CARDIOVERSION  10/22/2012   Procedure: TRANSESOPHAGEAL ECHOCARDIOGRAM (TEE);  Surgeon: Josue Hector, MD;  Location: [facility] ENDOSCOPY;  Service: Cardiovascular;  Laterality: N/A;    Family History  Problem Relation Age of Onset  . Hypertension Father   . Hypertension Mother   . Cancer - Lung Brother     SOCIAL HX: see above    Current Outpatient Medications:  .  acetaminophen (TYLENOL) 500 MG tablet, Take 500 mg by mouth 2 (two)  times daily as needed for headache (pain). , Disp: , Rfl:  .  carvedilol (COREG) 3.125 MG tablet, take 1 tablet by mouth twice a day with food, Disp: 60 tablet, Rfl: 5 .  cetirizine (ZYRTEC) 10 MG tablet, Take 10 mg by mouth at bedtime. , Disp: , Rfl:  .  cholecalciferol (VITAMIN D) 1000 units tablet, Take 2,000 Units by mouth 2 (two) times daily., Disp: , Rfl:  .  Cyanocobalamin (VITAMIN B12 PO), Take 1 tablet by mouth daily. , Disp: , Rfl:  .  denosumab (PROLIA) 60 MG/ML SOLN injection, Inject 60 mg into the skin every 6 (six) months. Administer in upper arm, thigh, or abdomen (last injection June or July 2018), Disp: , Rfl:  .  diclofenac sodium (VOLTAREN) 1 % GEL, Apply 1 application topically 4 (four) times daily as needed (arm pain/bursitis)., Disp: , Rfl:  .  diltiazem (CARDIZEM CD) 240 MG 24 hr capsule, take 1 capsule by mouth once daily, Disp: 30 capsule, Rfl: 5 .  DULoxetine (CYMBALTA) 20 MG capsule, take 1 capsule by mouth once daily, Disp: 30 capsule, Rfl: 5 .  ELIQUIS 5 MG TABS tablet, TAKE 1 TABLET BY MOUTH TWICE A DAY, Disp: 60 tablet, Rfl: 1 .  fenofibrate (TRICOR) 145 MG tablet, take 1 tablet by mouth once daily, Disp: 90 tablet, Rfl: 1 .  fluticasone (FLONASE) 50 MCG/ACT nasal spray, INSTILL 1 SPRAY INTO EACH NOSTRIL TWICE  A DAY, Disp: 16 g, Rfl: 5 .  furosemide (LASIX) 20 MG tablet, Take 1 tablet (20 mg total) by mouth daily as needed. Use only as needed for severe leg swelling., Disp: 30 tablet, Rfl: 3 .  losartan (COZAAR) 50 MG tablet, take 1 tablet by mouth once daily (Patient taking differently: take 1 tablet (50 mg) by mouth once daily), Disp: 90 tablet, Rfl: 1 .  Multiple Vitamins-Minerals (PRESERVISION AREDS PO), Take 1 tablet by mouth 3 (three) times daily. , Disp: , Rfl:  .  oxybutynin (DITROPAN-XL) 5 MG 24 hr tablet, take 1 tablet by mouth once daily, Disp: 90 tablet, Rfl: 1 .  pantoprazole (PROTONIX) 20 MG tablet, take 1 tablet by mouth once daily, Disp: 90 tablet, Rfl:  1 .  Polyethyl Glycol-Propyl Glycol (SYSTANE OP), Place 1 drop into both eyes 2 (two) times daily as needed (dry eyes)., Disp: , Rfl:   EXAM:  Vitals:   02/17/18 1623  BP: 122/74  Pulse: 80  Temp: 97.6 F (36.4 C)    Body mass index is 35.87 kg/m.  GENERAL: vitals reviewed and listed above, alert, oriented, appears well hydrated and in no acute distress  HEENT: atraumatic, conjunttiva clear, no obvious abnormalities on inspection of external nose and ears  NECK: no obvious masses on inspection  LUNGS: clear to auscultation bilaterally, no wheezes, rales or rhonchi, good air movement  CV: irr irr, mild LE edema - improved from in the past, compression on, mild erythema skin L LE without warmth, induration or signs of infection  MS: moves all extremities without noticeable abnormality, uses walker, TTP thoracic paraspinal muscle L T 5-10  PSYCH: pleasant and cooperative, no obvious depression or anxiety  ASSESSMENT AND PLAN:  Discussed the following assessment and plan:  Chronic left-sided thoracic back pain -discussed potential etiologies, no rash - suspect musculoskeletal -conservative management discussed - heat, topical menthol, tylenol, gentle activity -follow up if worsening, other symptoms or persists in 3-4 weeks  Leg edema -improving, continue compression and lasix -elevated twice daily advised  New onset a-fib ([place]) -seeing cardiologist, stable -bp improved  AWV and follow up in 3 months advised Patient Instructions  BEFORE YOU LEAVE: -follow up:  -CANCEL any follow up currently scheduled -SCHEDULE AWV and follow up same day in about 3 months   Heat, topical menthol (such as tiger balm), tylenol 500-1000mg  up to 3 times daily as needed for pain. Move frequently. Follow up in 3-4 weeks if persists.   Continue compression socks. Elevate legs twice daily.     Lucretia Kern, DO

## 2018-02-27 ENCOUNTER — Other Ambulatory Visit: Payer: Self-pay | Admitting: Family Medicine

## 2018-03-24 ENCOUNTER — Ambulatory Visit: Payer: Medicare Other | Admitting: Family Medicine

## 2018-03-24 ENCOUNTER — Other Ambulatory Visit: Payer: Self-pay | Admitting: Family Medicine

## 2018-03-25 ENCOUNTER — Other Ambulatory Visit: Payer: Self-pay | Admitting: *Deleted

## 2018-03-25 MED ORDER — CARVEDILOL 3.125 MG PO TABS: 3.1250 mg | ORAL_TABLET | Freq: Two times a day (BID) | ORAL | 5 refills | Status: DC

## 2018-03-25 NOTE — Telephone Encounter (Signed)
Rx done. 

## 2018-04-01 ENCOUNTER — Encounter (INDEPENDENT_AMBULATORY_CARE_PROVIDER_SITE_OTHER): Payer: Medicare Other | Admitting: Ophthalmology

## 2018-04-01 DIAGNOSIS — H43813 Vitreous degeneration, bilateral: Secondary | ICD-10-CM

## 2018-04-01 DIAGNOSIS — I10 Essential (primary) hypertension: Secondary | ICD-10-CM | POA: Diagnosis not present

## 2018-04-01 DIAGNOSIS — H318 Other specified disorders of choroid: Secondary | ICD-10-CM

## 2018-04-01 DIAGNOSIS — H353122 Nonexudative age-related macular degeneration, left eye, intermediate dry stage: Secondary | ICD-10-CM | POA: Diagnosis not present

## 2018-04-01 DIAGNOSIS — H35073 Retinal telangiectasis, bilateral: Secondary | ICD-10-CM

## 2018-04-01 DIAGNOSIS — H35033 Hypertensive retinopathy, bilateral: Secondary | ICD-10-CM | POA: Diagnosis not present

## 2018-04-13 ENCOUNTER — Encounter (INDEPENDENT_AMBULATORY_CARE_PROVIDER_SITE_OTHER): Payer: Medicare Other | Admitting: Ophthalmology

## 2018-04-13 DIAGNOSIS — H318 Other specified disorders of choroid: Secondary | ICD-10-CM

## 2018-04-13 DIAGNOSIS — H353211 Exudative age-related macular degeneration, right eye, with active choroidal neovascularization: Secondary | ICD-10-CM | POA: Diagnosis not present

## 2018-04-19 ENCOUNTER — Other Ambulatory Visit: Payer: Self-pay | Admitting: Family Medicine

## 2018-05-11 ENCOUNTER — Encounter (INDEPENDENT_AMBULATORY_CARE_PROVIDER_SITE_OTHER): Payer: Medicare Other | Admitting: Ophthalmology

## 2018-05-11 DIAGNOSIS — H318 Other specified disorders of choroid: Secondary | ICD-10-CM | POA: Diagnosis not present

## 2018-05-11 DIAGNOSIS — H353211 Exudative age-related macular degeneration, right eye, with active choroidal neovascularization: Secondary | ICD-10-CM

## 2018-05-13 ENCOUNTER — Other Ambulatory Visit: Payer: Self-pay | Admitting: Family Medicine

## 2018-05-17 ENCOUNTER — Other Ambulatory Visit: Payer: Self-pay | Admitting: Family Medicine

## 2018-05-18 ENCOUNTER — Other Ambulatory Visit: Payer: Self-pay | Admitting: *Deleted

## 2018-05-18 DIAGNOSIS — H353123 Nonexudative age-related macular degeneration, left eye, advanced atrophic without subfoveal involvement: Secondary | ICD-10-CM | POA: Diagnosis not present

## 2018-05-18 DIAGNOSIS — H04123 Dry eye syndrome of bilateral lacrimal glands: Secondary | ICD-10-CM | POA: Diagnosis not present

## 2018-05-18 DIAGNOSIS — Z961 Presence of intraocular lens: Secondary | ICD-10-CM | POA: Diagnosis not present

## 2018-05-18 DIAGNOSIS — H5213 Myopia, bilateral: Secondary | ICD-10-CM | POA: Diagnosis not present

## 2018-05-18 DIAGNOSIS — H353213 Exudative age-related macular degeneration, right eye, with inactive scar: Secondary | ICD-10-CM | POA: Diagnosis not present

## 2018-05-18 MED ORDER — DILTIAZEM HCL ER COATED BEADS 240 MG PO CP24: 240.0000 mg | ORAL_CAPSULE | Freq: Every day | ORAL | 5 refills | Status: DC

## 2018-05-18 NOTE — Telephone Encounter (Signed)
Rx done. 

## 2018-05-27 ENCOUNTER — Other Ambulatory Visit: Payer: Self-pay | Admitting: Family Medicine

## 2018-06-16 ENCOUNTER — Ambulatory Visit (INDEPENDENT_AMBULATORY_CARE_PROVIDER_SITE_OTHER): Payer: Medicare Other | Admitting: Family Medicine

## 2018-06-16 ENCOUNTER — Encounter: Payer: Self-pay | Admitting: Family Medicine

## 2018-06-16 VITALS — BP 112/60 | HR 81 | Temp 98.2°F | Ht <= 58 in

## 2018-06-16 DIAGNOSIS — I10 Essential (primary) hypertension: Secondary | ICD-10-CM

## 2018-06-16 DIAGNOSIS — R739 Hyperglycemia, unspecified: Secondary | ICD-10-CM

## 2018-06-16 DIAGNOSIS — M545 Low back pain, unspecified: Secondary | ICD-10-CM

## 2018-06-16 DIAGNOSIS — N3281 Overactive bladder: Secondary | ICD-10-CM

## 2018-06-16 DIAGNOSIS — E7800 Pure hypercholesterolemia, unspecified: Secondary | ICD-10-CM

## 2018-06-16 DIAGNOSIS — I872 Venous insufficiency (chronic) (peripheral): Secondary | ICD-10-CM | POA: Diagnosis not present

## 2018-06-16 DIAGNOSIS — N183 Chronic kidney disease, stage 3 unspecified: Secondary | ICD-10-CM

## 2018-06-16 DIAGNOSIS — K5900 Constipation, unspecified: Secondary | ICD-10-CM

## 2018-06-16 DIAGNOSIS — F3341 Major depressive disorder, recurrent, in partial remission: Secondary | ICD-10-CM

## 2018-06-16 DIAGNOSIS — D649 Anemia, unspecified: Secondary | ICD-10-CM

## 2018-06-16 DIAGNOSIS — I4891 Unspecified atrial fibrillation: Secondary | ICD-10-CM

## 2018-06-16 MED ORDER — TIZANIDINE HCL 2 MG PO TABS: 2.0000 mg | ORAL_TABLET | Freq: Every evening | ORAL | 0 refills | Status: DC | PRN

## 2018-06-16 NOTE — Progress Notes (Signed)
HPI:  Using dictation device. Unfortunately this device frequently misinterprets words/phrases.  Charlene Wade is a pleasant 84 y.o. here for follow up. Chronic medical problems summarized below were reviewed for changes.  New complaint of low back pain x ~ 4-7 days. Reports mod - severe pain in the low back L > R. Worse at night. Better after up and around. No weakness, numbness, radiation today, fevers, malaise. She reports a remote history of DDD and lumbar partial discectomy. She thinks this flare of pain was caused by straining when she had some constipation last week. Reports doing better now. Not really taking anything for this. Has been seen at [facility] ortho for other orthopedic issues. On Cymbalta for chronic pain. O/w, she also doesn't like to take the lasix so is not taking it lately. she does not like that it increases urination issues. Denies increased swelling, CP, SOB, DOE, treatment intolerance or new symptoms. Using compression socks.  Atrial fibrillation, hypertension, chronic kidney disease, hyperlipidemia, history of CVA, lower extremity edema: -Sees cardiologist (Dr. Gwenlyn Found) and neurology -Medications include Coreg, diltiazem, Eliquis, TriCor, Lasix, losartan  Chronic cough, acid reflux, postnasal drip, allergic rhinitis: -hx hiatal hernia, sees Dr. Senaida Ores -Chronic issue, has seen GI and ear nose and throat -Medications include pantoprazole 20 mg, Flonase, Zyrtec  Osteoporosis: -Sees Dr. Cruzita Lederer, tx with Prolia and vitamin D  Depression, chronic pain: -Started Cymbalta in 2018 which has worked well for her  OOB: -Medications include oxybutynin  Anemia: -Appears to be fairly chronic, mild -stool cards ok   Obesity: -poor diet, no regular exercise   ROS: See pertinent positives and negatives per HPI.  Past Medical History:  Diagnosis Date  . Allergic rhinitis   . BCC (basal cell carcinoma of skin) 04/10/2015   sees dermatologist  . Depression 10/17/2012   . Ectopic pregnancy   . GERD (gastroesophageal reflux disease)    hx hiatal hernia, has seen GI, ENT and allergist in the past  . Hyperlipidemia   . Hypertension   . OA (osteoarthritis)    knees and back, hx DDD, s/p remote lumbar disc surgery  . OAB (overactive bladder) 04/10/2015  . Obesity   . Osteoporosis    on prolia in the past  . Stroke [facility])    sees neurologist, hx memory loss, expressive aphasia  . Venous insufficiency     Past Surgical History:  Procedure Laterality Date  . ABDOMINAL HYSTERECTOMY    . APPENDECTOMY    . BREAST REDUCTION SURGERY    . LAPAROSCOPIC SALPINGOOPHERECTOMY    . NASAL MASS EXCISION     Basal Cell Cancer Excision  . NASAL RECONSTRUCTION    . TEE WITHOUT CARDIOVERSION  10/22/2012   Procedure: TRANSESOPHAGEAL ECHOCARDIOGRAM (TEE);  Surgeon: Josue Hector, MD;  Location: [facility] ENDOSCOPY;  Service: Cardiovascular;  Laterality: N/A;    Family History  Problem Relation Age of Onset  . Hypertension Father   . Hypertension Mother   . Cancer - Lung Brother     SOCIAL HX: see hpi   Current Outpatient Medications:  .  acetaminophen (TYLENOL) 500 MG tablet, Take 500 mg by mouth 2 (two) times daily as needed for headache (pain). , Disp: , Rfl:  .  carvedilol (COREG) 3.125 MG tablet, Take 1 tablet (3.125 mg total) by mouth 2 (two) times daily with a meal., Disp: 60 tablet, Rfl: 5 .  cetirizine (ZYRTEC) 10 MG tablet, Take 10 mg by mouth at bedtime. , Disp: , Rfl:  .  cholecalciferol (  VITAMIN D) 1000 units tablet, Take 2,000 Units by mouth 2 (two) times daily., Disp: , Rfl:  .  Cyanocobalamin (VITAMIN B12 PO), Take 1 tablet by mouth daily. , Disp: , Rfl:  .  denosumab (PROLIA) 60 MG/ML SOLN injection, Inject 60 mg into the skin every 6 (six) months. Administer in upper arm, thigh, or abdomen (last injection June or July 2018), Disp: , Rfl:  .  diclofenac sodium (VOLTAREN) 1 % GEL, Apply 1 application topically 4 (four) times daily as needed (arm  pain/bursitis)., Disp: , Rfl:  .  diltiazem (CARDIZEM CD) 240 MG 24 hr capsule, Take 1 capsule (240 mg total) by mouth daily., Disp: 30 capsule, Rfl: 5 .  DULoxetine (CYMBALTA) 20 MG capsule, take 1 capsule by mouth once daily, Disp: 30 capsule, Rfl: 5 .  ELIQUIS 5 MG TABS tablet, TAKE 1 TABLET BY MOUTH TWICE DAILY, Disp: 60 tablet, Rfl: 5 .  fenofibrate (TRICOR) 145 MG tablet, take 1 tablet by mouth once daily, Disp: 90 tablet, Rfl: 1 .  fluticasone (FLONASE) 50 MCG/ACT nasal spray, SHAKE LIQUID AND USE 1 SPRAY IN EACH NOSTRIL TWICE DAILY, Disp: 48 g, Rfl: 4 .  furosemide (LASIX) 20 MG tablet, Take 1 tablet (20 mg total) by mouth daily as needed. Use only as needed for severe leg swelling., Disp: 30 tablet, Rfl: 3 .  losartan (COZAAR) 50 MG tablet, TAKE 1 TABLET BY MOUTH ONCE DAILY, Disp: 90 tablet, Rfl: 1 .  Multiple Vitamins-Minerals (PRESERVISION AREDS PO), Take 1 tablet by mouth 3 (three) times daily. , Disp: , Rfl:  .  oxybutynin (DITROPAN-XL) 5 MG 24 hr tablet, take 1 tablet by mouth once daily, Disp: 90 tablet, Rfl: 1 .  pantoprazole (PROTONIX) 20 MG tablet, TAKE 1 TABLET BY MOUTH ONCE DAILY, Disp: 90 tablet, Rfl: 1 .  Polyethyl Glycol-Propyl Glycol (SYSTANE OP), Place 1 drop into both eyes 2 (two) times daily as needed (dry eyes)., Disp: , Rfl:  .  tiZANidine (ZANAFLEX) 2 MG tablet, Take 1 tablet (2 mg total) by mouth at bedtime as needed for muscle spasms., Disp: 20 tablet, Rfl: 0  EXAM:  Vitals:   06/16/18 1600  BP: 112/60  Pulse: 81  Temp: 98.2 F (36.8 C)    Body mass index is 35.87 kg/m.  GENERAL: vitals reviewed and listed above, alert, oriented, appears well hydrated and in no acute distress  HEENT: atraumatic, conjunttiva clear, no obvious abnormalities on inspection of external nose and ears  NECK: no obvious masses on inspection  LUNGS: clear to auscultation bilaterally, no wheezes, rales or rhonchi, good air movement  CV: HRRR, no peripheral edema  MS: moves  all extremities without noticeable abnormality, got her to stand up on her own and walk several steps to see if this causes any radicular symptoms - she had pain in the low back with getting out of the chair but denies radicular symptoms, she was ok with smaller moves involving rotation and flex/ext lumbosacral spine. TTP of L lower lumbar paraspinal region - paticularly around L4-5, normal sensitivity to light touch and strength in the lower extremities bilat, neg SLRT and CLRT exam limited o/w due to her body habitus and soreness today.  PSYCH: pleasant and cooperative, no obvious depression or anxiety  ASSESSMENT AND PLAN:  Discussed the following assessment and plan: More than 50% of over 40 minutes spent in total in caring for this patient was spent face-to-face with the patient, counseling and/or coordinating care.    Acute left-sided low  back pain without sciatica -we discussed possible serious and likely etiologies, workup and treatment, treatment risks and return precautions. She would prefer to avoid invasive treatments and reports injections never helped in the past. -after this discussion, T[place]gan opted for gentle chair exercises demonstrated and practiced with her, heat, topical menthol, tylenol prn pain, low dose tizanidine, close follow up -also discussed imaging, ortho referral, OMM, consideration oral prednisone if sugar ok and not improving with other measures -follow up advised in 2-4 weeks, sooner if any worsening or new concerns -also advised her of the UCC at her orthopedic office if she decides to see them ([place] [facility])  Hyperglycemia - Plan: Hemoglobin A1c, lifestyle recs  Atrial fibrillation, unspecified type [facility]) - sees cardiologist  CKD (chronic kidney disease) stage 3, GFR 30-59 ml/min (HCC) - check BMP, avoid nsaids   Essential hypertension - Plan: Basic metabolic panel, CBC  Pure hypercholesterolemia  OAB (overactive bladder) - discussed options, seeing urology,  exercises/PT, she did not seem to be interested  Recurrent major depressive disorder, in partial remission ([place]) -stable  Discussed various options for constipation. Chronic issue for her. She prefers to use prunes. I did advise mirilax for 5-7 days when stopped up, but she doesn't like that she then gets loose bowels with this.  Advise of risks/benefits with lasix and unfortunately she gets severe LE edema  --> cellulitis when she does not use the lasix. Advised her to try using as much as possible and to discuss with her cardiologist if not taking.  Patient Instructions  BEFORE YOU LEAVE: -labs -follow up: 1 month  For the back pain: Heat 15 minutes twice daily Tylenol 1000mg  twice daily as needed Do the exercises I showed your for 5-10 minutes twice daily Muscle relaxer before bed (tizanidine) Call me with an update in 1 week, sooner if worsening or new concerns  We have ordered labs or studies at this visit. It can take up to 1-2 weeks for results and processing. IF results require follow up or explanation, we will call you with instructions. Clinically stable results will be released to your [facility]. If you have not heard from [place] or cannot find your results in [facility] in 2 weeks please contact our office at [phone].  If you are not yet signed up for [facility], please consider signing up.            Lucretia Kern, DO

## 2018-06-16 NOTE — Patient Instructions (Signed)
BEFORE YOU LEAVE: -labs -follow up: 1 month  For the back pain: Heat 15 minutes twice daily Tylenol 1000mg  twice daily as needed Do the exercises I showed your for 5-10 minutes twice daily Muscle relaxer before bed (tizanidine) Call me with an update in 1 week, sooner if worsening or new concerns  We have ordered labs or studies at this visit. It can take up to 1-2 weeks for results and processing. IF results require follow up or explanation, we will call you with instructions. Clinically stable results will be released to your [facility]. If you have not heard from [place] or cannot find your results in [facility] in 2 weeks please contact our office at [phone].  If you are not yet signed up for [facility], please consider signing up.

## 2018-06-17 LAB — BASIC METABOLIC PANEL
BUN: 17 mg/dL (ref 6–23)
CALCIUM: 9.5 mg/dL (ref 8.4–10.5)
CHLORIDE: 106 meq/L (ref 96–112)
CO2: 27 meq/L (ref 19–32)
CREATININE: 1.38 mg/dL — AB (ref 0.40–1.20)
GFR: 38.31 mL/min — ABNORMAL LOW (ref >60.00)
Glucose, Bld: 92 mg/dL (ref 70–99)
Potassium: 4.1 mEq/L (ref 3.5–5.1)
Sodium: 142 mEq/L (ref 135–145)

## 2018-06-17 LAB — HEMOGLOBIN A1C: HEMOGLOBIN A1C: 6 % (ref 4.6–6.5)

## 2018-06-17 LAB — CBC
HEMATOCRIT: 32.9 % — AB (ref 36.0–46.0)
Hemoglobin: 10.6 g/dL — ABNORMAL LOW (ref 12.0–15.0)
MCHC: 32.1 g/dL (ref 30.0–36.0)
MCV: 85.9 fl (ref 78.0–100.0)
Platelets: 407 10*3/uL — ABNORMAL HIGH (ref 150.0–400.0)
RBC: 3.83 Mil/uL — ABNORMAL LOW (ref 3.87–5.11)
RDW: 15.7 % — AB (ref 11.5–15.5)
WBC: 7.9 10*3/uL (ref 4.0–10.5)

## 2018-06-18 NOTE — Addendum Note (Signed)
Addended by: Agnes Lawrence on: 06/18/2018 02:31 PM   Modules accepted: Orders

## 2018-06-19 ENCOUNTER — Encounter: Payer: Self-pay | Admitting: *Deleted

## 2018-06-22 ENCOUNTER — Telehealth: Payer: Self-pay | Admitting: Family Medicine

## 2018-06-22 ENCOUNTER — Encounter: Payer: Self-pay | Admitting: Internal Medicine

## 2018-06-22 ENCOUNTER — Ambulatory Visit (INDEPENDENT_AMBULATORY_CARE_PROVIDER_SITE_OTHER): Payer: Medicare Other | Admitting: Internal Medicine

## 2018-06-22 VITALS — BP 110/68 | HR 78 | Ht <= 58 in | Wt 161.0 lb

## 2018-06-22 DIAGNOSIS — M810 Age-related osteoporosis without current pathological fracture: Secondary | ICD-10-CM | POA: Diagnosis not present

## 2018-06-22 DIAGNOSIS — M549 Dorsalgia, unspecified: Secondary | ICD-10-CM

## 2018-06-22 DIAGNOSIS — E559 Vitamin D deficiency, unspecified: Secondary | ICD-10-CM | POA: Diagnosis not present

## 2018-06-22 LAB — VITAMIN D 25 HYDROXY (VIT D DEFICIENCY, FRACTURES): VITD: 40.63 ng/mL (ref 30.00–100.00)

## 2018-06-22 NOTE — Progress Notes (Signed)
Patient ID: Charlene Wade, female   DOB: October 01, 1928, 84 y.o.   MRN: [id number]   HPI  Charlene Wade is a 84 y.o.-year-old female,  Osteoporosis and vitamin D deficiency.Last visit 1 year ago.  She is here with her caregiver who offers some of Charlene history, especially related to her past medical history and medications.  Before last visit, she developed left hip bursitis and she got several steroid injections in Charlene hip.  She also had steroid injections.  No injections since last visit.    No falls and fractures since last visit. She is in a wheelchair today but usually she walks with a walker.  No jaw pain, hip pain (except radiating from back), thigh pain.  She had A fib and a small stroke since last visit.  Reviewed and addended history: Pt was dx with OP in 2007.  She fell and had a R humerus fx (comminuted) >> 11/2011.  Reviewed DXA scan reports:  07/09/2017: Lumbar spine L2-L4 (L1) Femoral neck (FN)  T-score +0.9 RFN: -1.7 LFN: -1.2  Change in BMD from previous DXA test (%) +7.8%* -2.1%  (*) statistically significant   06/28/2015 (Charlene Wade) Lumbar spine (L1-L4) Femoral neck (FN)  T-score  +0.4  RFN: -1.3 LFN: -1.3    08/01/2011 - Charlene Wade ([facility]):  L1-L3 (L4) T score: 0.0 RFN T score: -1.6 LFN T score: -1.7 FRAX score: MOF 12.6%, hip fracture risk 3.2%  05/28/2006 [facility]  L1-L4 T score: -1.0 LFN T score: -1.3 1/3 distal forearm T score: -2.7 VFA was performed and was read as abnormal, however no details given in Charlene report and Charlene image quality is too poor for me to analyze.  Treatments: - Fosamax - for "many years"  - developed a cyst ~2011 years ago in Charlene bone of Charlene wisdom tooth >> extraction >> infection >> was told she had a jaw fracture - Prolia - so far, she took it consistently since 2013 (Charlene Wade).  Received records from Charlene Wade: Prolia injections documented-  no co-pay required:  On Prolia. She had 8 doses: - 09/14/2014  - 03/03/2015  - 09/19/2015 - 03/19/2016  - 10/08/2016 - 04/16/2017 - 10/15/2017 - ?  She has a history of vitamin D deficiency, but latest levels have been normal: Lab Results  Component Value Date   VD25OH 31.94 06/09/2017   VD25OH 48.19 06/03/2016   VD25OH 34.96 10/02/2015   VD25OH 15.79 (L) 06/02/2015   She takes 4000 units vitamin D daily.  She also eats dairy (yoghurt every day) but no green, leafy, vegetables.   No weightbearing exercises.  No stairs at home. 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   No history of hyper/persistent hypocalcemia.  She had a slightly low parathyroid level when checked in 2013.  No history of kidney stones. Lab Results  Component Value Date   PTH 8.3 (L) 10/13/2012   CALCIUM 9.5 06/16/2018   CALCIUM 8.6 11/04/2017   CALCIUM 8.6 (L) 09/28/2017   CALCIUM 8.7 (L) 09/27/2017   CALCIUM 9.0 09/26/2017   CALCIUM 9.8 07/08/2017   CALCIUM 9.0 03/21/2017   CALCIUM 8.7 11/19/2016   CALCIUM 9.2 08/16/2016   CALCIUM 9.6 03/18/2016   No history of thyro toxicosis. Last TSH normal: Lab Results  Component Value Date   TSH 0.438 09/27/2017   TSH 1.48 06/02/2015   + CKD. Last BUN/Cr: Lab Results  Component Value Date  BUN 17 06/16/2018   CREATININE 1.38 (H) 06/16/2018   Pt does have a FH of osteoporosis.  Her mother has osteoporosis and has had an arm fracture.  She also has a history of spinal stenosis - s/p surgery for ruptured disk.  She had spine injections in Charlene past.  ROS: Constitutional: + Fatigue, + weight loss, no subjective hyperthermia, + subjective hypothermia Eyes: no blurry vision, no xerophthalmia ENT: no sore throat, no nodules palpated in throat, no dysphagia, no odynophagia, no hoarseness Cardiovascular: no CP/+ SOB/no palpitations/+ leg swelling Respiratory: no cough/+ SOB/+ wheezing Gastrointestinal: no N/no V/no D/no C/no acid reflux Musculoskeletal:  + Muscle aches/+ joint aches Skin: no rashes, no hair loss Neurological: no tremors/no numbness/no tingling/no dizziness  I reviewed pt's medications, allergies, PMH, social hx, family hx, and changes were documented in Charlene history of present illness. Otherwise, unchanged from my initial visit note.  Past Medical History:  Diagnosis Date  . Allergic rhinitis   . BCC (basal cell carcinoma of skin) 04/10/2015   sees dermatologist  . Depression 10/17/2012  . Ectopic pregnancy   . GERD (gastroesophageal reflux disease)    hx hiatal hernia, has seen GI, ENT and allergist in Charlene past  . Hyperlipidemia   . Hypertension   . OA (osteoarthritis)    knees and back, hx DDD, s/p remote lumbar disc surgery  . OAB (overactive bladder) 04/10/2015  . Obesity   . Osteoporosis    on prolia in Charlene past  . Stroke Charlene Wade)    sees neurologist, hx memory loss, expressive aphasia  . Venous insufficiency    Past Surgical History:  Procedure Laterality Date  . ABDOMINAL HYSTERECTOMY    . APPENDECTOMY    . BREAST REDUCTION SURGERY    . LAPAROSCOPIC SALPINGOOPHERECTOMY    . NASAL MASS EXCISION     Basal Cell Cancer Excision  . NASAL RECONSTRUCTION    . TEE WITHOUT CARDIOVERSION  10/22/2012   Procedure: TRANSESOPHAGEAL ECHOCARDIOGRAM (TEE);  Surgeon: Josue Hector, MD;  Location: [facility];  Service: Cardiovascular;  Laterality: N/A;   Social History   Socioeconomic History  . Marital status: Widowed    Spouse name: Not on file  . Number of children: 2  . Years of education: 70  . Highest education level: Not on file  Occupational History  . Occupation: retired  Scientific laboratory technician  . Financial resource strain: Not on file  . Food insecurity:    Worry: Not on file    Inability: Not on file  . Transportation needs:    Medical: Not on file    Non-medical: Not on file  Tobacco Use  . Smoking status: Former Research scientist (life sciences)  . Smokeless tobacco: Never Used  Substance and Sexual Activity  . Alcohol use: No   . Drug use: No  . Sexual activity: Never  Lifestyle  . Physical activity:    Days per week: Not on file    Minutes per session: Not on file  . Stress: Not on file  Relationships  . Social connections:    Talks on phone: Not on file    Gets together: Not on file    Attends religious service: Not on file    Active member of club or organization: Not on file    Attends meetings of clubs or organizations: Not on file    Relationship status: Not on file  . Intimate partner violence:    Fear of current or ex partner: Not on file  Emotionally abused: Not on file    Physically abused: Not on file    Forced sexual activity: Not on file  Other Topics Concern  . Not on file  Social History Narrative   Patient is single with 2 children.   Patient is right handed.   Patient has 12 th grade education.   Patient does not drink caffeine.   Current Outpatient Medications on File Prior to Visit  Medication Sig Dispense Refill  . acetaminophen (TYLENOL) 500 MG tablet Take 500 mg by mouth 2 (two) times daily as needed for headache (pain).     . carvedilol (COREG) 3.125 MG tablet Take 1 tablet (3.125 mg total) by mouth 2 (two) times daily with a meal. 60 tablet 5  . cetirizine (ZYRTEC) 10 MG tablet Take 10 mg by mouth at bedtime.     . cholecalciferol (VITAMIN D) 1000 units tablet Take 2,000 Units by mouth 2 (two) times daily.    . Cyanocobalamin (VITAMIN B12 PO) Take 1 tablet by mouth daily.     Marland Kitchen denosumab (PROLIA) 60 MG/ML SOLN injection Inject 60 mg into Charlene skin every 6 (six) months. Administer in upper arm, thigh, or abdomen (last injection June or July 2018)    . diclofenac sodium (VOLTAREN) 1 % GEL Apply 1 application topically 4 (four) times daily as needed (arm pain/bursitis).    Marland Kitchen diltiazem (CARDIZEM CD) 240 MG 24 hr capsule Take 1 capsule (240 mg total) by mouth daily. 30 capsule 5  . DULoxetine (CYMBALTA) 20 MG capsule take 1 capsule by mouth once daily 30 capsule 5  . ELIQUIS 5 MG  TABS tablet TAKE 1 TABLET BY MOUTH TWICE DAILY 60 tablet 5  . fenofibrate (TRICOR) 145 MG tablet take 1 tablet by mouth once daily 90 tablet 1  . fluticasone (FLONASE) 50 MCG/ACT nasal spray SHAKE LIQUID AND USE 1 SPRAY IN EACH NOSTRIL TWICE DAILY 48 g 4  . furosemide (LASIX) 20 MG tablet Take 1 tablet (20 mg total) by mouth daily as needed. Use only as needed for severe leg swelling. 30 tablet 3  . losartan (COZAAR) 50 MG tablet TAKE 1 TABLET BY MOUTH ONCE DAILY 90 tablet 1  . Multiple Vitamins-Minerals (PRESERVISION AREDS PO) Take 1 tablet by mouth 3 (three) times daily.     Marland Kitchen oxybutynin (DITROPAN-XL) 5 MG 24 hr tablet take 1 tablet by mouth once daily 90 tablet 1  . pantoprazole (PROTONIX) 20 MG tablet TAKE 1 TABLET BY MOUTH ONCE DAILY 90 tablet 1  . Polyethyl Glycol-Propyl Glycol (SYSTANE OP) Place 1 drop into both eyes 2 (two) times daily as needed (dry eyes).    Marland Kitchen tiZANidine (ZANAFLEX) 2 MG tablet Take 1 tablet (2 mg total) by mouth at bedtime as needed for muscle spasms. 20 tablet 0   No current facility-administered medications on file prior to visit.    Allergies  Allergen Reactions  . Levaquin [Levofloxacin] Other (See Comments)    Severe intermittent arm pain  . Ace Inhibitors Cough  . Codeine Other (See Comments)    Unknown reaction - didn't feel well?  . Penicillins Other (See Comments)    Caused high fever Has patient had a PCN reaction causing immediate rash, facial/tongue/throat swelling, SOB or lightheadedness with hypotension: No Has patient had a PCN reaction causing severe rash involving mucus membranes or skin necrosis: No Has patient had a PCN reaction that required hospitalization: No Has patient had a PCN reaction occurring within Charlene last 10 years: No If  all of Charlene above answers are "NO", then may proceed with Cephalosporin use.  . Sulfa Antibiotics Other (See Comments)    Possibly caused fever  . Sulfasalazine Other (See Comments)    Possibly caused fever    Family History  Problem Relation Age of Onset  . Hypertension Father   . Hypertension Mother   . Cancer - Lung Brother    PE: BP 110/68   Pulse 78   Ht 4\' 8"  (1.422 m)   Wt 161 lb (73 kg)   SpO2 98%   BMI 36.10 kg/m  Wt Readings from Last 3 Encounters:  06/22/18 161 lb (73 kg)  02/17/18 160 lb (72.6 kg)  01/14/18 163 lb (73.9 kg)   Constitutional: overweight, in NAD Eyes: PERRLA, EOMI, no exophthalmos ENT: moist mucous membranes, no thyromegaly, no cervical lymphadenopathy Cardiovascular: RRR, No MRG, + pitting edema B LE Respiratory: CTA B Gastrointestinal: abdomen soft, NT, ND, BS+ Musculoskeletal: + kyphosis , strength intact in all 4 Skin: moist, warm, no rashes Neurological: no tremor with outstretched hands, DTR normal in all 4  Assessment: 1. Osteoporosis  2. Vit D deficiency  Plan: 1. Osteoporosis -Likely postmenopausal +/- secondary to steroids -She had a humeral fracture in 2013, no fractures since -We reviewed together her latest DXA scan report from last year: Improved at Charlene level of Charlene spine but slightly worse at Charlene level of Charlene hip -She continues on Prolia, now completed 4 years.  We cannot use bisphosphonates due to Charlene decreased GFR.  I plan to continue Prolia for at least 6 years. -No side effects from Prolia -She had dental work last year, without any problems -We will arrange for Charlene new Prolia injection -We will check another DXA scan next year -She had a recent GFR checked so we will not recheck this today - RTC in 1 year  2. Vit D def -Continue 4000 units vitamin D daily -We will check her level today  Component     Latest Ref Rng & Units 06/22/2018  VITD     30.00 - 100.00 ng/mL 40.63   Vitamin D is normal. Philemon Kingdom, MD PhD [facility]

## 2018-06-22 NOTE — Telephone Encounter (Signed)
I called the pt and informed [place] the referral was placed and someone will call with appt info.

## 2018-06-22 NOTE — Telephone Encounter (Signed)
Copied from [place] [phone]. Topic: Referral - Request >> Jun 22, 2018 10:57 AM Sheran Luz wrote: Reason for CRM: Pts caregiver called requesting a referral for pt to an ortho specialist stating that pt feels they are not getting better.

## 2018-06-22 NOTE — Patient Instructions (Signed)
Please stop at the lab.  Please come back for a follow-up appointment in 1 year.  

## 2018-06-22 NOTE — Telephone Encounter (Signed)
Ok to refer. I think this was for back pain.

## 2018-06-23 ENCOUNTER — Telehealth: Payer: Self-pay | Admitting: Family Medicine

## 2018-06-23 NOTE — Telephone Encounter (Signed)
Copied from [place] [id number]. Topic: General - Other >> Jun 23, 2018 10:38 AM Keene Breath wrote: Reason for CRM: Patient's caregiver called to request that the referral for patient be changed to the [place] instead of the [facility] office.  They do not understand why it was sent to [place].  They prefer [facility] and would liked to go there.  Please advise.  CB# for [place] [phone])  Please call asap because patient is in a lot of pain.

## 2018-06-23 NOTE — Telephone Encounter (Signed)
I relayed this message to our referral coordinator, she stated she will change the provider to [place] and contact the pt with appt info.

## 2018-06-26 DIAGNOSIS — M4856XA Collapsed vertebra, not elsewhere classified, lumbar region, initial encounter for fracture: Secondary | ICD-10-CM | POA: Diagnosis not present

## 2018-06-26 DIAGNOSIS — M4186 Other forms of scoliosis, lumbar region: Secondary | ICD-10-CM | POA: Diagnosis not present

## 2018-06-26 DIAGNOSIS — M545 Low back pain: Secondary | ICD-10-CM | POA: Diagnosis not present

## 2018-06-28 ENCOUNTER — Other Ambulatory Visit: Payer: Self-pay | Admitting: Family Medicine

## 2018-07-03 ENCOUNTER — Telehealth: Payer: Self-pay | Admitting: Internal Medicine

## 2018-07-03 DIAGNOSIS — M545 Low back pain: Secondary | ICD-10-CM | POA: Diagnosis not present

## 2018-07-03 NOTE — Telephone Encounter (Signed)
Patients returning call from the office

## 2018-07-03 NOTE — Telephone Encounter (Signed)
No one called pt per chart

## 2018-07-06 DIAGNOSIS — M545 Low back pain: Secondary | ICD-10-CM | POA: Diagnosis not present

## 2018-07-07 ENCOUNTER — Telehealth: Payer: Self-pay

## 2018-07-07 NOTE — Telephone Encounter (Signed)
Called patient today to schedule her for Prolia injection and she informed me that she has already received the injection at our office last month- I have looked through the chart extensively with Dr. Cruzita Lederer  And we can not find where this has been documented anywhere- I spoke with Courtney L. And she stated she did not receive the injection on that visit but patient and her care giver are stating they are sure that she got it that day please advise

## 2018-07-13 ENCOUNTER — Encounter (INDEPENDENT_AMBULATORY_CARE_PROVIDER_SITE_OTHER): Payer: Medicare Other | Admitting: Ophthalmology

## 2018-07-13 DIAGNOSIS — H348312 Tributary (branch) retinal vein occlusion, right eye, stable: Secondary | ICD-10-CM | POA: Diagnosis not present

## 2018-07-13 DIAGNOSIS — H35033 Hypertensive retinopathy, bilateral: Secondary | ICD-10-CM

## 2018-07-13 DIAGNOSIS — H43813 Vitreous degeneration, bilateral: Secondary | ICD-10-CM | POA: Diagnosis not present

## 2018-07-13 DIAGNOSIS — H318 Other specified disorders of choroid: Secondary | ICD-10-CM | POA: Diagnosis not present

## 2018-07-13 DIAGNOSIS — I10 Essential (primary) hypertension: Secondary | ICD-10-CM

## 2018-07-13 DIAGNOSIS — H353122 Nonexudative age-related macular degeneration, left eye, intermediate dry stage: Secondary | ICD-10-CM | POA: Diagnosis not present

## 2018-07-14 DIAGNOSIS — M48061 Spinal stenosis, lumbar region without neurogenic claudication: Secondary | ICD-10-CM | POA: Diagnosis not present

## 2018-07-15 ENCOUNTER — Other Ambulatory Visit: Payer: Self-pay

## 2018-07-15 ENCOUNTER — Encounter: Payer: Self-pay | Admitting: Family Medicine

## 2018-07-15 ENCOUNTER — Ambulatory Visit (INDEPENDENT_AMBULATORY_CARE_PROVIDER_SITE_OTHER): Payer: Medicare Other | Admitting: Family Medicine

## 2018-07-15 VITALS — BP 120/66 | HR 82 | Temp 97.5°F | Resp 16 | Ht <= 58 in | Wt 161.2 lb

## 2018-07-15 DIAGNOSIS — R600 Localized edema: Secondary | ICD-10-CM | POA: Diagnosis not present

## 2018-07-15 DIAGNOSIS — M79605 Pain in left leg: Secondary | ICD-10-CM | POA: Diagnosis not present

## 2018-07-15 MED ORDER — TRAMADOL HCL 50 MG PO TABS: 50.0000 mg | ORAL_TABLET | Freq: Four times a day (QID) | ORAL | 0 refills | Status: DC | PRN

## 2018-07-15 NOTE — Progress Notes (Signed)
Subjective:     Patient ID: Charlene Wade, female   DOB: May 11, 1928, 84 y.o.   MRN: [id number]  HPI   Patient is seen as a work in with left lower extremity pain. She apparently has long-standing history of back difficulties with lumbar stenosis and some neurogenic claudication. She states couple days ago she had increased pain radiating from her left hip toward her buttocks and all the way down to the lower leg. She describes this as somewhat of a throbbing type pain intermittently. Pain at rest worse than ambulation. She's had some mild increased swelling bilaterally past few days but has not been taking her usual Lasix because of several doctors appointments.  She saw her back specialist several days ago. She states she had MRI scan about a week ago. She reportedly is being referred to pain management through [facility]. She states her recent MRI revealed a couple of compression fractures and some scoliosis and spinal stenosis. She takes miacalcin nasal.  Ambulates with walker. Patient states her pain was severe last night 10 out of 10 severity and not relieved with Tylenol. She is on Eliquis for atrial fib history and takes this regularly. No history of DVT.  Past Medical History:  Diagnosis Date  . Allergic rhinitis   . BCC (basal cell carcinoma of skin) 04/10/2015   sees dermatologist  . Depression 10/17/2012  . Ectopic pregnancy   . GERD (gastroesophageal reflux disease)    hx hiatal hernia, has seen GI, ENT and allergist in the past  . Hyperlipidemia   . Hypertension   . OA (osteoarthritis)    knees and back, hx DDD, s/p remote lumbar disc surgery  . OAB (overactive bladder) 04/10/2015  . Obesity   . Osteoporosis    on prolia in the past  . Stroke [facility])    sees neurologist, hx memory loss, expressive aphasia  . Venous insufficiency    Past Surgical History:  Procedure Laterality Date  . ABDOMINAL HYSTERECTOMY    . APPENDECTOMY    . BREAST REDUCTION SURGERY     . LAPAROSCOPIC SALPINGOOPHERECTOMY    . NASAL MASS EXCISION     Basal Cell Cancer Excision  . NASAL RECONSTRUCTION    . TEE WITHOUT CARDIOVERSION  10/22/2012   Procedure: TRANSESOPHAGEAL ECHOCARDIOGRAM (TEE);  Surgeon: Josue Hector, MD;  Location: [facility] ENDOSCOPY;  Service: Cardiovascular;  Laterality: N/A;    reports that she has quit smoking. She has never used smokeless tobacco. She reports that she does not drink alcohol or use drugs. family history includes Cancer - Lung in her brother; Hypertension in her father and mother. Allergies  Allergen Reactions  . Levaquin [Levofloxacin] Other (See Comments)    Severe intermittent arm pain  . Ace Inhibitors Cough  . Codeine Other (See Comments)    Unknown reaction - didn't feel well?  . Penicillins Other (See Comments)    Caused high fever Has patient had a PCN reaction causing immediate rash, facial/tongue/throat swelling, SOB or lightheadedness with hypotension: No Has patient had a PCN reaction causing severe rash involving mucus membranes or skin necrosis: No Has patient had a PCN reaction that required hospitalization: No Has patient had a PCN reaction occurring within the last 10 years: No If all of the above answers are "NO", then may proceed with Cephalosporin use.  . Sulfa Antibiotics Other (See Comments)    Possibly caused fever  . Sulfasalazine Other (See Comments)    Possibly caused fever     Review  of Systems  Constitutional: Negative for chills and fever.  Respiratory: Negative for shortness of breath.   Cardiovascular: Positive for leg swelling. Negative for chest pain.  Gastrointestinal: Negative for abdominal pain.  Genitourinary: Negative for dysuria.  Musculoskeletal: Positive for back pain.  Neurological: Negative for weakness and numbness.       Objective:   Physical Exam  Constitutional: She appears well-developed and well-nourished.  Cardiovascular: Normal rate.  Pulmonary/Chest: Effort normal and  breath sounds normal.  Musculoskeletal: She exhibits edema.  She has trace nonpitting edema lower legs bilaterally. No calf tenderness. Legs are warm to touch with good capillary refill. Straight leg raise negative. Deep tendon reflexes trace knee and ankle bilaterally. Full-strength with plantar flexion dorsiflexion bilaterally       Assessment:     #1 Left lower extremity pain of 2 day duration. She has long-standing history of lumbar stenosis.   Suspect neuropathic/radicular in nature  #2 bilateral leg edema- relatively mild and likely exacerbated by recent poor compliance with Lasix.    Plan:     -continue Tylenol as needed -Wrote for limited Ultram 50 mg one every 6 hours as needed for severe pain -She is encouraged to get back on her Lasix 20 mg daily -continue close follow-up with her back specialist  Eulas Post MD [facility]

## 2018-07-15 NOTE — Patient Instructions (Signed)
Get back on Lasix one daily  May take the Tramadol up to one every 6 hours.  Follow up with back specialist.

## 2018-07-16 NOTE — Telephone Encounter (Signed)
LM for pt to call back to discuss

## 2018-07-20 NOTE — Progress Notes (Signed)
HPI:  Using dictation device. Unfortunately this device frequently misinterprets words/phrases. Due for bmp, cbc recheck.  Charlene Wade is a pleasant 84 yo with a complicated PMH here for 1 month follow up. She was having some bad back pain (referred to her orthopedic specialist), was not taking her lasix consistently and had some mild chronic anemia and decreased renal function on labs at visit 1 month ago. Reports back pain is a little better and has follow-up with her specialist next week.  They have referred her to pain management for nonsurgical treatment of her back pain.  She has been using the Ultram rarely for the pain. She has recurring issues with lower extremity edema.  Unfortunately this is been very difficult to treat as she has many barriers to using the compression socks and elevating her legs.  She also does not like to Lasix as it makes her urinate.  She has some mild erythema of the legs today.  No fevers, malaise or significant change. No bleeding and cardiologist reviewed her recent labs.  We will recheck her labs today. Takes tessalon from time to time for chronic cough. Requesting refill.  ROS: See pertinent positives and negatives per HPI.  Past Medical History:  Diagnosis Date  . Allergic rhinitis   . BCC (basal cell carcinoma of skin) 04/10/2015   sees dermatologist  . Depression 10/17/2012  . Ectopic pregnancy   . GERD (gastroesophageal reflux disease)    hx hiatal hernia, has seen GI, ENT and allergist in the past  . Hyperlipidemia   . Hypertension   . OA (osteoarthritis)    knees and back, hx DDD, s/p remote lumbar disc surgery  . OAB (overactive bladder) 04/10/2015  . Obesity   . Osteoporosis    on prolia in the past  . Stroke [facility])    sees neurologist, hx memory loss, expressive aphasia  . Venous insufficiency     Past Surgical History:  Procedure Laterality Date  . ABDOMINAL HYSTERECTOMY    . APPENDECTOMY    . BREAST REDUCTION SURGERY    .  LAPAROSCOPIC SALPINGOOPHERECTOMY    . NASAL MASS EXCISION     Basal Cell Cancer Excision  . NASAL RECONSTRUCTION    . TEE WITHOUT CARDIOVERSION  10/22/2012   Procedure: TRANSESOPHAGEAL ECHOCARDIOGRAM (TEE);  Surgeon: Josue Hector, MD;  Location: [facility] ENDOSCOPY;  Service: Cardiovascular;  Laterality: N/A;    Family History  Problem Relation Age of Onset  . Hypertension Father   . Hypertension Mother   . Cancer - Lung Brother     SOCIAL HX: see hpi   Current Outpatient Medications:  .  acetaminophen (TYLENOL) 500 MG tablet, Take 500 mg by mouth 2 (two) times daily as needed for headache (pain). , Disp: , Rfl:  .  calcitonin, salmon, (MIACALCIN/FORTICAL) 200 UNIT/ACT nasal spray, , Disp: , Rfl: 0 .  carvedilol (COREG) 3.125 MG tablet, Take 1 tablet (3.125 mg total) by mouth 2 (two) times daily with a meal., Disp: 60 tablet, Rfl: 5 .  cetirizine (ZYRTEC) 10 MG tablet, Take 10 mg by mouth at bedtime. , Disp: , Rfl:  .  cholecalciferol (VITAMIN D) 1000 units tablet, Take 2,000 Units by mouth 2 (two) times daily., Disp: , Rfl:  .  Cyanocobalamin (VITAMIN B12 PO), Take 1 tablet by mouth daily. , Disp: , Rfl:  .  denosumab (PROLIA) 60 MG/ML SOLN injection, Inject 60 mg into the skin every 6 (six) months. Administer in upper arm, thigh, or abdomen (  last injection June or July 2018), Disp: , Rfl:  .  diclofenac sodium (VOLTAREN) 1 % GEL, Apply 1 application topically 4 (four) times daily as needed (arm pain/bursitis)., Disp: , Rfl:  .  diltiazem (CARDIZEM CD) 240 MG 24 hr capsule, Take 1 capsule (240 mg total) by mouth daily., Disp: 30 capsule, Rfl: 5 .  DULoxetine (CYMBALTA) 20 MG capsule, take 1 capsule by mouth once daily, Disp: 30 capsule, Rfl: 5 .  ELIQUIS 5 MG TABS tablet, TAKE 1 TABLET BY MOUTH TWICE DAILY, Disp: 60 tablet, Rfl: 5 .  fenofibrate (TRICOR) 145 MG tablet, take 1 tablet by mouth once daily, Disp: 90 tablet, Rfl: 1 .  fluticasone (FLONASE) 50 MCG/ACT nasal spray, SHAKE LIQUID  AND USE 1 SPRAY IN EACH NOSTRIL TWICE DAILY, Disp: 48 g, Rfl: 4 .  furosemide (LASIX) 20 MG tablet, Take 1 tablet (20 mg total) by mouth daily. As needed for swelling in the legs., Disp: 30 tablet, Rfl: 3 .  losartan (COZAAR) 50 MG tablet, TAKE 1 TABLET BY MOUTH ONCE DAILY, Disp: 90 tablet, Rfl: 1 .  Multiple Vitamins-Minerals (PRESERVISION AREDS PO), Take 1 tablet by mouth 3 (three) times daily. , Disp: , Rfl:  .  oxybutynin (DITROPAN-XL) 5 MG 24 hr tablet, TAKE 1 TABLET BY MOUTH ONCE DAILY, Disp: 90 tablet, Rfl: 1 .  pantoprazole (PROTONIX) 20 MG tablet, TAKE 1 TABLET BY MOUTH ONCE DAILY, Disp: 90 tablet, Rfl: 1 .  Polyethyl Glycol-Propyl Glycol (SYSTANE OP), Place 1 drop into both eyes 2 (two) times daily as needed (dry eyes)., Disp: , Rfl:  .  tiZANidine (ZANAFLEX) 2 MG tablet, Take 1 tablet (2 mg total) by mouth at bedtime as needed for muscle spasms., Disp: 20 tablet, Rfl: 0 .  traMADol (ULTRAM) 50 MG tablet, Take 1 tablet (50 mg total) by mouth every 6 (six) hours as needed., Disp: 30 tablet, Rfl: 0 .  benzonatate (TESSALON PERLES) 100 MG capsule, Take 1 capsule (100 mg total) by mouth 3 (three) times daily as needed., Disp: 20 capsule, Rfl: 0  EXAM:  Vitals:   07/21/18 1555  BP: 104/60  Pulse: 84  Temp: 98.3 F (36.8 C)    Body mass index is 36.14 kg/m.  GENERAL: vitals reviewed and listed above, alert, oriented, appears well hydrated and in no acute distress  HEENT: atraumatic, conjunttiva clear, no obvious abnormalities on inspection of external nose and ears  NECK: no obvious masses on inspection  LUNGS: clear to auscultation bilaterally, no wheezes, rales or rhonchi, good air movement  CV: HRRR, bilateral lower extremity edema  MS: in wheelchair  PSYCH: pleasant and cooperative, no obvious depression or anxiety  ASSESSMENT AND PLAN:  Discussed the following assessment and plan:  Acute kidney injury superimposed on CKD ([place]) - Plan: Basic metabolic  panel  Anemia, unspecified type - Plan: CBC  Chronic low back pain, unspecified back pain laterality, with sciatica presence unspecified  Chronic cough  -Recheck CBC and BMP to ensure stable -Recommended elevation, compression and Lasix as needed for the swelling, no signs of cellulitis today on exam -Seen pubic specialist for the back issues, also sees endocrinologist for osteoporosis -refilled tessalon for cough -follow up 3-4 months and as needed -Patient advised to return or notify a doctor immediately if symptoms worsen or persist or new concerns arise.  Patient Instructions  BEFORE YOU LEAVE: -? Flu shot  -labs -follow up: 3-4 months  We have ordered labs or studies at this visit. It can take up  to 1-2 weeks for results and processing. IF results require follow up or explanation, we will call you with instructions. Clinically stable results will be released to your [facility]. If you have not heard from [place] or cannot find your results in [facility] in 2 weeks please contact our office at [phone].  If you are not yet signed up for [facility], please consider signing up.  Recommend compression as able, lasix daily and elevation as able for the swelling in the legs.  The orthopedic office prescribed the Calcitonin. Would follow up with them regarding refills and also for pain management.  I sent a refill for the cough pills.      Lucretia Kern, DO

## 2018-07-21 ENCOUNTER — Encounter: Payer: Self-pay | Admitting: Family Medicine

## 2018-07-21 ENCOUNTER — Ambulatory Visit (INDEPENDENT_AMBULATORY_CARE_PROVIDER_SITE_OTHER): Payer: Medicare Other | Admitting: Family Medicine

## 2018-07-21 VITALS — BP 104/60 | HR 84 | Temp 98.3°F | Ht <= 58 in

## 2018-07-21 DIAGNOSIS — N189 Chronic kidney disease, unspecified: Secondary | ICD-10-CM | POA: Diagnosis not present

## 2018-07-21 DIAGNOSIS — G8929 Other chronic pain: Secondary | ICD-10-CM | POA: Diagnosis not present

## 2018-07-21 DIAGNOSIS — D649 Anemia, unspecified: Secondary | ICD-10-CM

## 2018-07-21 DIAGNOSIS — M545 Low back pain: Secondary | ICD-10-CM | POA: Diagnosis not present

## 2018-07-21 DIAGNOSIS — R05 Cough: Secondary | ICD-10-CM

## 2018-07-21 DIAGNOSIS — R053 Chronic cough: Secondary | ICD-10-CM

## 2018-07-21 DIAGNOSIS — Z23 Encounter for immunization: Secondary | ICD-10-CM | POA: Diagnosis not present

## 2018-07-21 DIAGNOSIS — N179 Acute kidney failure, unspecified: Secondary | ICD-10-CM

## 2018-07-21 MED ORDER — FUROSEMIDE 20 MG PO TABS: 20.0000 mg | ORAL_TABLET | Freq: Every day | ORAL | 3 refills | Status: DC

## 2018-07-21 MED ORDER — BENZONATATE 100 MG PO CAPS: 100.0000 mg | ORAL_CAPSULE | Freq: Three times a day (TID) | ORAL | 0 refills | Status: DC | PRN

## 2018-07-21 NOTE — Patient Instructions (Signed)
BEFORE YOU LEAVE: -? Flu shot  -labs -follow up: 3-4 months  We have ordered labs or studies at this visit. It can take up to 1-2 weeks for results and processing. IF results require follow up or explanation, we will call you with instructions. Clinically stable results will be released to your [facility]. If you have not heard from [place] or cannot find your results in [facility] in 2 weeks please contact our office at [phone].  If you are not yet signed up for [facility], please consider signing up.  Recommend compression as able, lasix daily and elevation as able for the swelling in the legs.  The orthopedic office prescribed the Calcitonin. Would follow up with them regarding refills and also for pain management.  I sent a refill for the cough pills.

## 2018-07-21 NOTE — Addendum Note (Signed)
Addended by: Agnes Lawrence on: 07/21/2018 05:15 PM   Modules accepted: Orders

## 2018-07-22 LAB — CBC
HCT: 32.3 % — ABNORMAL LOW (ref 36.0–46.0)
Hemoglobin: 10.5 g/dL — ABNORMAL LOW (ref 12.0–15.0)
MCHC: 32.5 g/dL (ref 30.0–36.0)
MCV: 85.3 fl (ref 78.0–100.0)
Platelets: 377 10*3/uL (ref 150.0–400.0)
RBC: 3.78 Mil/uL — AB (ref 3.87–5.11)
RDW: 15.1 % (ref 11.5–15.5)
WBC: 7.6 10*3/uL (ref 4.0–10.5)

## 2018-07-22 LAB — BASIC METABOLIC PANEL
BUN: 28 mg/dL — AB (ref 6–23)
CALCIUM: 9.1 mg/dL (ref 8.4–10.5)
CO2: 28 meq/L (ref 19–32)
Chloride: 104 mEq/L (ref 96–112)
Creatinine, Ser: 1.17 mg/dL (ref 0.40–1.20)
GFR: 46.34 mL/min — ABNORMAL LOW (ref >60.00)
Glucose, Bld: 128 mg/dL — ABNORMAL HIGH (ref 70–99)
Potassium: 3.9 mEq/L (ref 3.5–5.1)
Sodium: 141 mEq/L (ref 135–145)

## 2018-07-27 ENCOUNTER — Telehealth: Payer: Self-pay

## 2018-07-27 NOTE — Telephone Encounter (Signed)
    [facility] Pre-operative Risk Assessment    Request for surgical clearance:  1. What type of surgery is being performed? Lumbar ESI   2. When is this surgery scheduled? 08/11/18  3. What type of clearance is required (medical clearance vs. Pharmacy clearance to hold med vs. Both)? medication  4. Are there any medications that need to be held prior to surgery and how long?Eliquis 3 days prior  5. Practice name and name of physician performing surgery? [facility],  MD not listed  6. What is your office phone number [phone]   7.   What is your office fax number [phone]  8.   Anesthesia type (None, local, MAC, general) ? Not listed   Charlene Wade 07/27/2018, 4:57 PM  _________________________________________________________________   (provider comments below)

## 2018-07-28 NOTE — Telephone Encounter (Signed)
Patient with diagnosis of Afib on Eliquis for anticoagulation.    Procedure: lumbar ESI Date of procedure: 08/11/18  CHADS2-VASc score of  6 (CHF, HTN, AGE, DM2, stroke/tia x 2, CAD, AGE, female)  CrCl 31ml/min  Will route to MD to address length of hold in setting of spinal procedure with history of stroke and poor kidney function. We generally hold Chase for 3 days prior to spinal procedure. Would recommend resume anticoagulation as soon as safe post procedure.

## 2018-07-29 NOTE — Telephone Encounter (Signed)
Given prior stroke, I would probably stop Eliquis 2 days before the lumbar puncture and bridge with Lovenox.  Can probably restart 2 days afterwards.  Time to restart should be determined by the physician performing the procedure.

## 2018-07-29 NOTE — Telephone Encounter (Signed)
LMOM to schedule for Lovenox bridging in office. Per Erasmo Downer ok to schedule on 10/11 when reach patient.   This clearance has been faxed back to [facility] at the number provided.

## 2018-07-30 NOTE — Telephone Encounter (Signed)
Spoke with Pt caregiver and made appt. She will bring patient to appt.

## 2018-08-03 NOTE — Telephone Encounter (Signed)
Please review note below- patient stating she is sure she already received Prolia from this office in August but I can not not find any record of it should we wait for her to get it again feb or give it to her again please advise

## 2018-08-03 NOTE — Telephone Encounter (Signed)
We can wait until February in this case. However, need to make sure she did not get the flu shot instead/.Marland KitchenMarland Kitchen

## 2018-08-04 DIAGNOSIS — M961 Postlaminectomy syndrome, not elsewhere classified: Secondary | ICD-10-CM | POA: Diagnosis not present

## 2018-08-04 DIAGNOSIS — M5136 Other intervertebral disc degeneration, lumbar region: Secondary | ICD-10-CM | POA: Diagnosis not present

## 2018-08-04 NOTE — Telephone Encounter (Signed)
Sounds good, Ty!

## 2018-08-04 NOTE — Telephone Encounter (Signed)
Caregiver and patient stated they are sure it was not the flu shot- I will put her in January slot to get Prolia in Feb

## 2018-08-09 ENCOUNTER — Other Ambulatory Visit: Payer: Self-pay | Admitting: Family Medicine

## 2018-08-10 ENCOUNTER — Encounter (INDEPENDENT_AMBULATORY_CARE_PROVIDER_SITE_OTHER): Payer: Medicare Other | Admitting: Ophthalmology

## 2018-08-10 ENCOUNTER — Other Ambulatory Visit: Payer: Self-pay | Admitting: *Deleted

## 2018-08-10 MED ORDER — FENOFIBRATE 145 MG PO TABS: 145.0000 mg | ORAL_TABLET | Freq: Every day | ORAL | 1 refills | Status: DC

## 2018-08-10 NOTE — Telephone Encounter (Signed)
Rx done. 

## 2018-08-12 DIAGNOSIS — L720 Epidermal cyst: Secondary | ICD-10-CM | POA: Diagnosis not present

## 2018-08-12 DIAGNOSIS — D1721 Benign lipomatous neoplasm of skin and subcutaneous tissue of right arm: Secondary | ICD-10-CM | POA: Diagnosis not present

## 2018-08-12 DIAGNOSIS — Z808 Family history of malignant neoplasm of other organs or systems: Secondary | ICD-10-CM | POA: Diagnosis not present

## 2018-08-12 DIAGNOSIS — Z23 Encounter for immunization: Secondary | ICD-10-CM | POA: Diagnosis not present

## 2018-08-12 DIAGNOSIS — L821 Other seborrheic keratosis: Secondary | ICD-10-CM | POA: Diagnosis not present

## 2018-08-12 DIAGNOSIS — Z85828 Personal history of other malignant neoplasm of skin: Secondary | ICD-10-CM | POA: Diagnosis not present

## 2018-08-12 DIAGNOSIS — D225 Melanocytic nevi of trunk: Secondary | ICD-10-CM | POA: Diagnosis not present

## 2018-08-12 DIAGNOSIS — R609 Edema, unspecified: Secondary | ICD-10-CM | POA: Diagnosis not present

## 2018-08-14 ENCOUNTER — Telehealth: Payer: Self-pay

## 2018-08-14 NOTE — Telephone Encounter (Signed)
Ok to refill. #90 1 RF.  I am wondering why this refill request came to me? Not a controlled med. Pt seen recently. Not a complaint, just wondering if something changed with refill protocol for antidepressants? Thanks!

## 2018-08-14 NOTE — Telephone Encounter (Signed)
Copied from [place] [id number]. Topic: General - Other >> Aug 14, 2018 11:11 AM Yvette Rack wrote: Reason for CRM: Pt requests refill of DULoxetine (CYMBALTA) 20 MG capsule. [id number] [address], [place] [phone] (Phone)  [phone] (Fax)

## 2018-09-07 ENCOUNTER — Encounter: Payer: Self-pay | Admitting: Family Medicine

## 2018-09-07 ENCOUNTER — Ambulatory Visit (INDEPENDENT_AMBULATORY_CARE_PROVIDER_SITE_OTHER): Payer: Medicare Other | Admitting: Family Medicine

## 2018-09-07 VITALS — BP 120/80 | HR 67 | Temp 98.4°F | Ht <= 58 in

## 2018-09-07 DIAGNOSIS — J988 Other specified respiratory disorders: Secondary | ICD-10-CM | POA: Diagnosis not present

## 2018-09-07 DIAGNOSIS — R062 Wheezing: Secondary | ICD-10-CM | POA: Diagnosis not present

## 2018-09-07 MED ORDER — FLUTICASONE PROPIONATE HFA 44 MCG/ACT IN AERO: 2.0000 | INHALATION_SPRAY | Freq: Two times a day (BID) | RESPIRATORY_TRACT | 0 refills | Status: DC

## 2018-09-07 MED ORDER — ALBUTEROL SULFATE HFA 108 (90 BASE) MCG/ACT IN AERS: 2.0000 | INHALATION_SPRAY | Freq: Four times a day (QID) | RESPIRATORY_TRACT | 0 refills | Status: DC | PRN

## 2018-09-07 NOTE — Patient Instructions (Addendum)
PHQ9 due in epic Keep follow up as scheduled in January.  Nasal saline twice daily.  Flovent 2 puffs twice daily for 2 weeks  Albuterol as needed per instructions if wheezing.  I hope you are feeling better soon! Seek care promptly if your symptoms worsen, new concerns arise or you are not improving with treatment.  Eat a healthy low sugar diet.

## 2018-09-07 NOTE — Progress Notes (Signed)
HPI:  Using dictation device. Unfortunately this device frequently misinterprets words/phrases.   Acute visit for respiratory illness: -started: 1 week ago -symptoms:nasal congestion, sore throat, cough, PND, wheezing sometimes -denies:fever, SOB, NVD, tooth pain -has tried: childrens cough medication - helps, and tessalon - doesn't help -sick contacts/travel/risks: no reported flu, strep or tick exposure  ROS: See pertinent positives and negatives per HPI.  Past Medical History:  Diagnosis Date  . Allergic rhinitis   . BCC (basal cell carcinoma of skin) 04/10/2015   sees dermatologist  . Depression 10/17/2012  . Ectopic pregnancy   . GERD (gastroesophageal reflux disease)    hx hiatal hernia, has seen GI, ENT and allergist in the past  . Hyperlipidemia   . Hypertension   . OA (osteoarthritis)    knees and back, hx DDD, s/p remote lumbar disc surgery  . OAB (overactive bladder) 04/10/2015  . Obesity   . Osteoporosis    on prolia in the past  . Stroke [facility])    sees neurologist, hx memory loss, expressive aphasia  . Venous insufficiency     Past Surgical History:  Procedure Laterality Date  . ABDOMINAL HYSTERECTOMY    . APPENDECTOMY    . BREAST REDUCTION SURGERY    . LAPAROSCOPIC SALPINGOOPHERECTOMY    . NASAL MASS EXCISION     Basal Cell Cancer Excision  . NASAL RECONSTRUCTION    . TEE WITHOUT CARDIOVERSION  10/22/2012   Procedure: TRANSESOPHAGEAL ECHOCARDIOGRAM (TEE);  Surgeon: Josue Hector, MD;  Location: [facility] ENDOSCOPY;  Service: Cardiovascular;  Laterality: N/A;    Family History  Problem Relation Age of Onset  . Hypertension Father   . Hypertension Mother   . Cancer - Lung Brother     Social History   Socioeconomic History  . Marital status: Widowed    Spouse name: Not on file  . Number of children: 2  . Years of education: 76  . Highest education level: Not on file  Occupational History  . Occupation: retired  Scientific laboratory technician  . Financial  resource strain: Not on file  . Food insecurity:    Worry: Not on file    Inability: Not on file  . Transportation needs:    Medical: Not on file    Non-medical: Not on file  Tobacco Use  . Smoking status: Former Research scientist (life sciences)  . Smokeless tobacco: Never Used  Substance and Sexual Activity  . Alcohol use: No  . Drug use: No  . Sexual activity: Never  Lifestyle  . Physical activity:    Days per week: Not on file    Minutes per session: Not on file  . Stress: Not on file  Relationships  . Social connections:    Talks on phone: Not on file    Gets together: Not on file    Attends religious service: Not on file    Active member of club or organization: Not on file    Attends meetings of clubs or organizations: Not on file    Relationship status: Not on file  Other Topics Concern  . Not on file  Social History Narrative   Patient is single with 2 children.   Patient is right handed.   Patient has 12 th grade education.   Patient does not drink caffeine.     Current Outpatient Medications:  .  acetaminophen (TYLENOL) 500 MG tablet, Take 500 mg by mouth 2 (two) times daily as needed for headache (pain). , Disp: , Rfl:  .  benzonatate (TESSALON PERLES) 100 MG capsule, Take 1 capsule (100 mg total) by mouth 3 (three) times daily as needed., Disp: 20 capsule, Rfl: 0 .  calcitonin, salmon, (MIACALCIN/FORTICAL) 200 UNIT/ACT nasal spray, , Disp: , Rfl: 0 .  carvedilol (COREG) 3.125 MG tablet, Take 1 tablet (3.125 mg total) by mouth 2 (two) times daily with a meal., Disp: 60 tablet, Rfl: 5 .  cetirizine (ZYRTEC) 10 MG tablet, Take 10 mg by mouth at bedtime. , Disp: , Rfl:  .  cholecalciferol (VITAMIN D) 1000 units tablet, Take 2,000 Units by mouth 2 (two) times daily., Disp: , Rfl:  .  Cyanocobalamin (VITAMIN B12 PO), Take 1 tablet by mouth daily. , Disp: , Rfl:  .  denosumab (PROLIA) 60 MG/ML SOLN injection, Inject 60 mg into the skin every 6 (six) months. Administer in upper arm, thigh, or  abdomen (last injection June or July 2018), Disp: , Rfl:  .  diclofenac sodium (VOLTAREN) 1 % GEL, Apply 1 application topically 4 (four) times daily as needed (arm pain/bursitis)., Disp: , Rfl:  .  diltiazem (CARDIZEM CD) 240 MG 24 hr capsule, Take 1 capsule (240 mg total) by mouth daily., Disp: 30 capsule, Rfl: 5 .  DULoxetine (CYMBALTA) 20 MG capsule, TAKE 1 CAPSULE BY MOUTH ONCE DAILY, Disp: 90 capsule, Rfl: 0 .  ELIQUIS 5 MG TABS tablet, TAKE 1 TABLET BY MOUTH TWICE DAILY, Disp: 60 tablet, Rfl: 5 .  fenofibrate (TRICOR) 145 MG tablet, Take 1 tablet (145 mg total) by mouth daily., Disp: 90 tablet, Rfl: 1 .  fluticasone (FLONASE) 50 MCG/ACT nasal spray, SHAKE LIQUID AND USE 1 SPRAY IN EACH NOSTRIL TWICE DAILY, Disp: 48 g, Rfl: 4 .  furosemide (LASIX) 20 MG tablet, Take 1 tablet (20 mg total) by mouth daily. As needed for swelling in the legs., Disp: 30 tablet, Rfl: 3 .  losartan (COZAAR) 50 MG tablet, TAKE 1 TABLET BY MOUTH ONCE DAILY, Disp: 90 tablet, Rfl: 1 .  Multiple Vitamins-Minerals (PRESERVISION AREDS PO), Take 1 tablet by mouth 3 (three) times daily. , Disp: , Rfl:  .  oxybutynin (DITROPAN-XL) 5 MG 24 hr tablet, TAKE 1 TABLET BY MOUTH ONCE DAILY, Disp: 90 tablet, Rfl: 1 .  pantoprazole (PROTONIX) 20 MG tablet, TAKE 1 TABLET BY MOUTH ONCE DAILY, Disp: 90 tablet, Rfl: 1 .  Polyethyl Glycol-Propyl Glycol (SYSTANE OP), Place 1 drop into both eyes 2 (two) times daily as needed (dry eyes)., Disp: , Rfl:  .  tiZANidine (ZANAFLEX) 2 MG tablet, Take 1 tablet (2 mg total) by mouth at bedtime as needed for muscle spasms., Disp: 20 tablet, Rfl: 0 .  traMADol (ULTRAM) 50 MG tablet, Take 1 tablet (50 mg total) by mouth every 6 (six) hours as needed., Disp: 30 tablet, Rfl: 0 .  albuterol (PROVENTIL HFA;VENTOLIN HFA) 108 (90 Base) MCG/ACT inhaler, Inhale 2 puffs into the lungs every 6 (six) hours as needed., Disp: 1 Inhaler, Rfl: 0 .  fluticasone (FLOVENT HFA) 44 MCG/ACT inhaler, Inhale 2 puffs into the  lungs 2 (two) times daily for 14 days., Disp: 1 Inhaler, Rfl: 0  EXAM:  Vitals:   09/07/18 0809  BP: 120/80  Pulse: 67  Temp: 98.4 F (36.9 C)  SpO2: 98%    Body mass index is 36.14 kg/m.  GENERAL: vitals reviewed and listed above, alert, oriented, appears well hydrated and in no acute distress  HEENT: atraumatic, conjunttiva clear, no obvious abnormalities on inspection of external nose and ears, normal appearance of ear canals  and TMs, lots of clear nasal congestion, mild post oropharyngeal erythema with PND, no tonsillar edema or exudate, no sinus TTP  NECK: no obvious masses on inspection  LUNGS: clear to auscultation bilaterally, no wheezes, rales or rhonchi, good air movement  CV: HRRR, no peripheral edema  MS: moves all extremities without noticeable abnormality  PSYCH: pleasant and cooperative, no obvious depression or anxiety  ASSESSMENT AND PLAN:  Discussed the following assessment and plan:  Respiratory infection  Wheeze  -given HPI and exam findings today, a serious infection or illness is unlikely. We discussed potential etiologies, with VURI being most likely, and advised supportive care and monitoring. Possible mild bronchitis - no abnormality on exam today.We discussed treatment side effects, likely course, antibiotic misuse, transmission, and signs of developing a serious illness. -inhaled st short course, prn alb if wheezing, symptomatic care for cold -of course, we advised to return or notify a doctor immediately if symptoms worsen or persist or new concerns arise. -HM topics due adressed   Patient Instructions  PHQ9 due in epic Keep follow up as scheduled in January.  Nasal saline twice daily.  Flovent 2 puffs twice daily for 2 weeks  Albuterol as needed per instructions if wheezing.  I hope you are feeling better soon! Seek care promptly if your symptoms worsen, new concerns arise or you are not improving with treatment.  Eat a healthy low  sugar diet.     Lucretia Kern, DO

## 2018-10-12 ENCOUNTER — Encounter (INDEPENDENT_AMBULATORY_CARE_PROVIDER_SITE_OTHER): Payer: Medicare Other | Admitting: Ophthalmology

## 2018-10-12 DIAGNOSIS — H43813 Vitreous degeneration, bilateral: Secondary | ICD-10-CM | POA: Diagnosis not present

## 2018-10-12 DIAGNOSIS — H34831 Tributary (branch) retinal vein occlusion, right eye, with macular edema: Secondary | ICD-10-CM

## 2018-10-12 DIAGNOSIS — I10 Essential (primary) hypertension: Secondary | ICD-10-CM

## 2018-10-12 DIAGNOSIS — H35033 Hypertensive retinopathy, bilateral: Secondary | ICD-10-CM

## 2018-10-18 ENCOUNTER — Other Ambulatory Visit: Payer: Self-pay | Admitting: Family Medicine

## 2018-10-30 ENCOUNTER — Other Ambulatory Visit: Payer: Self-pay | Admitting: Family Medicine

## 2018-11-02 DIAGNOSIS — Z79891 Long term (current) use of opiate analgesic: Secondary | ICD-10-CM | POA: Diagnosis not present

## 2018-11-02 DIAGNOSIS — G894 Chronic pain syndrome: Secondary | ICD-10-CM | POA: Diagnosis not present

## 2018-11-06 ENCOUNTER — Other Ambulatory Visit: Payer: Self-pay | Admitting: Family Medicine

## 2018-11-10 ENCOUNTER — Other Ambulatory Visit: Payer: Self-pay | Admitting: *Deleted

## 2018-11-10 MED ORDER — APIXABAN 5 MG PO TABS: 5.0000 mg | ORAL_TABLET | Freq: Two times a day (BID) | ORAL | 5 refills | Status: DC

## 2018-11-10 NOTE — Telephone Encounter (Signed)
Rx done. 

## 2018-11-18 ENCOUNTER — Other Ambulatory Visit: Payer: Self-pay | Admitting: Family Medicine

## 2018-11-24 ENCOUNTER — Ambulatory Visit (INDEPENDENT_AMBULATORY_CARE_PROVIDER_SITE_OTHER): Payer: Medicare Other | Admitting: Family Medicine

## 2018-11-24 ENCOUNTER — Other Ambulatory Visit: Payer: Self-pay | Admitting: Family Medicine

## 2018-11-24 DIAGNOSIS — F339 Major depressive disorder, recurrent, unspecified: Secondary | ICD-10-CM | POA: Diagnosis not present

## 2018-11-24 DIAGNOSIS — N183 Chronic kidney disease, stage 3 unspecified: Secondary | ICD-10-CM

## 2018-11-24 DIAGNOSIS — M159 Polyosteoarthritis, unspecified: Secondary | ICD-10-CM

## 2018-11-24 DIAGNOSIS — G8929 Other chronic pain: Secondary | ICD-10-CM | POA: Diagnosis not present

## 2018-11-24 DIAGNOSIS — E7800 Pure hypercholesterolemia, unspecified: Secondary | ICD-10-CM | POA: Diagnosis not present

## 2018-11-24 DIAGNOSIS — I10 Essential (primary) hypertension: Secondary | ICD-10-CM

## 2018-11-24 DIAGNOSIS — K219 Gastro-esophageal reflux disease without esophagitis: Secondary | ICD-10-CM

## 2018-11-24 DIAGNOSIS — N3281 Overactive bladder: Secondary | ICD-10-CM | POA: Diagnosis not present

## 2018-11-24 DIAGNOSIS — R600 Localized edema: Secondary | ICD-10-CM | POA: Diagnosis not present

## 2018-11-24 DIAGNOSIS — I4891 Unspecified atrial fibrillation: Secondary | ICD-10-CM

## 2018-11-24 DIAGNOSIS — M79642 Pain in left hand: Secondary | ICD-10-CM | POA: Diagnosis not present

## 2018-11-24 DIAGNOSIS — M810 Age-related osteoporosis without current pathological fracture: Secondary | ICD-10-CM | POA: Diagnosis not present

## 2018-11-25 ENCOUNTER — Encounter: Payer: Self-pay | Admitting: Family Medicine

## 2018-11-25 NOTE — Progress Notes (Signed)
HPI:  Using dictation device. Unfortunately this device frequently misinterprets words/phrases.  Charlene Wade is a pleasant 85 yo with a PMH of HTN, A. Fib, hyperlipidemia, chronic LE edema (sees cardiology for management), Hx stoke remote, OAB, extensive OA and osteroporosis, GERD, depression, CKD and morbid obesity here for an acute visit regarding L thumb pain and for follow up. The thumb pain started acutely in the L thenar aspect of the L hand a 2 days ago. The only inciting event she can think of is locking/unlocking her walker. No weakness, numbness, swelling, redness. In terms of her chronic conditions, she continues to have swelling in her legs. She wear compression rarely per caregiver and does not take the lasix. Daughter denies any signs of cellulitis at this time. Mood is stable. Patient feels pain related to the compression fractures has improved. She sees Dr. Nelva Bush for chronic pain. She is frustrated about some confusion regarding her appointment with her bone specialist, but did get her prolia injection.   A. Fib, HTN, LE edema, HLD, Hx remote CVA: -sees cardiology, Dr. Gwenlyn Found, for management -meds: eliquis started in 2018, cartia XT, coreg, fenofibrate, lasix - noncompliant), losartan - managed by cardiology and not on statin, per their notes and neurology notes cont fenofibrate -saw neurologist in the past as well, was on plavix/asa prior to eliquis  Chronic pain, DDD: -sees Dr. Nelva Bush at [facility] ortho -also sees Dr. Cruzita Lederer for osteoporosis and Vit D def, on prolia, hx compression fx -meds: cymbalta, tizanidine prn, tramadol prn (not rxd by me), tylenol prn  Recurrent Depression: -meds: Cymbalta -stable, fairly well controlled considering health condition  GERD: -chronic, severe, with chronic cough and globus sensation -meds: pantoprazole -s/p several evaluations with GI in the past  Morbid obesity: -poor diet and decreased mobility due to other chronic  disease  OOB: -has tried a  Number of treatments, currently on ditropan  Allergic Rhinitis: -chronic -uses flonase and zyrtec intermittently  CKD, Chronic mild anemia: -stable labs 06/2018 with Hgb 10.5 and Cr 1.17   ROS: See pertinent positives and negatives per HPI.  Past Medical History:  Diagnosis Date  . Allergic rhinitis   . Atrial fibrillation ([place]) 09/26/2017   New-onset A. Fib now persistent  . BCC (basal cell carcinoma of skin) 04/10/2015   sees dermatologist  . CKD (chronic kidney disease) stage 3, GFR 30-59 ml/min ([place]) 04/26/2015  . Depression 10/17/2012  . Ectopic pregnancy   . GERD (gastroesophageal reflux disease)    hx hiatal hernia, has seen GI, ENT and allergist in the past  . Hyperlipidemia   . Hypertension   . OA (osteoarthritis)    knees and back, hx DDD, s/p remote lumbar disc surgery  . OAB (overactive bladder) 04/10/2015  . Obesity   . Osteoporosis    on prolia in the past  . Stroke [facility])    sees neurologist, hx memory loss, expressive aphasia  . Venous insufficiency     Past Surgical History:  Procedure Laterality Date  . ABDOMINAL HYSTERECTOMY    . APPENDECTOMY    . BREAST REDUCTION SURGERY    . LAPAROSCOPIC SALPINGOOPHERECTOMY    . NASAL MASS EXCISION     Basal Cell Cancer Excision  . NASAL RECONSTRUCTION    . TEE WITHOUT CARDIOVERSION  10/22/2012   Procedure: TRANSESOPHAGEAL ECHOCARDIOGRAM (TEE);  Surgeon: Josue Hector, MD;  Location: [facility] ENDOSCOPY;  Service: Cardiovascular;  Laterality: N/A;    Family History  Problem Relation Age of Onset  . Hypertension  Father   . Hypertension Mother   . Cancer - Lung Brother     SOCIAL HX: see hpi   Current Outpatient Medications:  .  acetaminophen (TYLENOL) 500 MG tablet, Take 500 mg by mouth 2 (two) times daily as needed for headache (pain). , Disp: , Rfl:  .  albuterol (PROVENTIL HFA;VENTOLIN HFA) 108 (90 Base) MCG/ACT inhaler, Inhale 2 puffs into the lungs every 6 (six) hours as  needed., Disp: 1 Inhaler, Rfl: 0 .  apixaban (ELIQUIS) 5 MG TABS tablet, Take 1 tablet (5 mg total) by mouth 2 (two) times daily., Disp: 60 tablet, Rfl: 5 .  benzonatate (TESSALON PERLES) 100 MG capsule, Take 1 capsule (100 mg total) by mouth 3 (three) times daily as needed., Disp: 20 capsule, Rfl: 0 .  calcitonin, salmon, (MIACALCIN/FORTICAL) 200 UNIT/ACT nasal spray, , Disp: , Rfl: 0 .  CARTIA XT 240 MG 24 hr capsule, TAKE 1 CAPSULE(240 MG) BY MOUTH DAILY, Disp: 30 capsule, Rfl: 5 .  carvedilol (COREG) 3.125 MG tablet, TAKE 1 TABLET(3.125 MG) BY MOUTH TWICE DAILY WITH A MEAL, Disp: 60 tablet, Rfl: 5 .  cetirizine (ZYRTEC) 10 MG tablet, Take 10 mg by mouth at bedtime. , Disp: , Rfl:  .  cholecalciferol (VITAMIN D) 1000 units tablet, Take 2,000 Units by mouth 2 (two) times daily., Disp: , Rfl:  .  Cyanocobalamin (VITAMIN B12 PO), Take 1 tablet by mouth daily. , Disp: , Rfl:  .  denosumab (PROLIA) 60 MG/ML SOLN injection, Inject 60 mg into the skin every 6 (six) months. Administer in upper arm, thigh, or abdomen (last injection June or July 2018), Disp: , Rfl:  .  diclofenac sodium (VOLTAREN) 1 % GEL, Apply 1 application topically 4 (four) times daily as needed (arm pain/bursitis)., Disp: , Rfl:  .  DULoxetine (CYMBALTA) 20 MG capsule, TAKE 1 CAPSULE BY MOUTH ONCE DAILY, Disp: 90 capsule, Rfl: 1 .  fenofibrate (TRICOR) 145 MG tablet, Take 1 tablet (145 mg total) by mouth daily., Disp: 90 tablet, Rfl: 1 .  fluticasone (FLONASE) 50 MCG/ACT nasal spray, SHAKE LIQUID AND USE 1 SPRAY IN EACH NOSTRIL TWICE DAILY, Disp: 48 g, Rfl: 4 .  fluticasone (FLOVENT HFA) 44 MCG/ACT inhaler, Inhale 2 puffs into the lungs 2 (two) times daily for 14 days., Disp: 1 Inhaler, Rfl: 0 .  furosemide (LASIX) 20 MG tablet, Take 1 tablet (20 mg total) by mouth daily. As needed for swelling in the legs., Disp: 30 tablet, Rfl: 3 .  losartan (COZAAR) 50 MG tablet, TAKE 1 TABLET BY MOUTH ONCE DAILY, Disp: 90 tablet, Rfl: 1 .   Multiple Vitamins-Minerals (PRESERVISION AREDS PO), Take 1 tablet by mouth 3 (three) times daily. , Disp: , Rfl:  .  oxybutynin (DITROPAN-XL) 5 MG 24 hr tablet, TAKE 1 TABLET BY MOUTH ONCE DAILY, Disp: 90 tablet, Rfl: 1 .  pantoprazole (PROTONIX) 20 MG tablet, TAKE 1 TABLET BY MOUTH ONCE DAILY, Disp: 90 tablet, Rfl: 1 .  Polyethyl Glycol-Propyl Glycol (SYSTANE OP), Place 1 drop into both eyes 2 (two) times daily as needed (dry eyes)., Disp: , Rfl:  .  tiZANidine (ZANAFLEX) 2 MG tablet, Take 1 tablet (2 mg total) by mouth at bedtime as needed for muscle spasms., Disp: 20 tablet, Rfl: 0 .  traMADol (ULTRAM) 50 MG tablet, Take 1 tablet (50 mg total) by mouth every 6 (six) hours as needed., Disp: 30 tablet, Rfl: 0  EXAM:  There were no vitals filed for this visit.  There is no  height or weight on file to calculate BMI.  GENERAL: vitals reviewed and listed above, alert, oriented, appears well hydrated and in no acute distress  HEENT: atraumatic, conjunttiva clear, no obvious abnormalities on inspection of external nose and ears  NECK: no obvious masses on inspection  LUNGS: clear to auscultation bilaterally, no wheezes, rales or rhonchi, good air movement  CV: HRRR today, 1 + LE ankle edema, wearing compression  MS: moves all extremities without noticeable abnormality, ttp mild in the musculature of the L thenar eminence, no bony or joint TTP, no erythema or swelling, good strength in hand wrist though abduction thumb against resistance is painful for her  PSYCH: pleasant and cooperative, no obvious depression or anxiety  ASSESSMENT AND PLAN:  Discussed the following assessment and plan:  Left hand pain  CKD (chronic kidney disease) stage 3, GFR 30-59 ml/min (HCC)  Essential hypertension  Morbid obesity (HCC)  Depression, recurrent (HCC)  Age-related osteoporosis without current pathological fracture  Atrial fibrillation, unspecified type (HCC)  Gastroesophageal reflux  disease, esophagitis presence not specified  Other chronic pain  OAB (overactive bladder)  Pure hypercholesterolemia  Leg edema  Osteoarthritis of multiple joints, unspecified osteoarthritis type  -discussed potential etiologies hand pain and suspect strain/sprain - opted for ice, jentle activity, topical tx with menthol and/or tylenol; needs to avoid nsaids - f/u if worsening or not resolving ove rthe next 1 week -other issues fairly stable -lifestyle recs, elevation legs, lasix use discussed -cont care with endo, ortho, cardiology -epic down at time of visit, no AVS, opted for labs next visit -Patient advised to return or notify a doctor immediately if symptoms worsen or persist or new concerns arise.  Patient Instructions      Lucretia Kern, DO

## 2018-12-01 ENCOUNTER — Other Ambulatory Visit: Payer: Self-pay | Admitting: Family Medicine

## 2019-01-06 ENCOUNTER — Encounter: Payer: Self-pay | Admitting: Internal Medicine

## 2019-01-06 ENCOUNTER — Ambulatory Visit (INDEPENDENT_AMBULATORY_CARE_PROVIDER_SITE_OTHER): Payer: Medicare Other | Admitting: Internal Medicine

## 2019-01-06 ENCOUNTER — Other Ambulatory Visit: Payer: Self-pay

## 2019-01-06 VITALS — BP 120/78 | HR 93 | Temp 98.7°F

## 2019-01-06 DIAGNOSIS — L03116 Cellulitis of left lower limb: Secondary | ICD-10-CM

## 2019-01-06 MED ORDER — DOXYCYCLINE HYCLATE 100 MG PO TABS: 100.0000 mg | ORAL_TABLET | Freq: Two times a day (BID) | ORAL | 0 refills | Status: AC

## 2019-01-06 NOTE — Progress Notes (Signed)
Established Patient Office Visit     CC/Reason for Visit: LLE swelling   HPI: Charlene Wade is a 85 y.o. female who is coming in today for the above mentioned reasons. Past Medical History is significant for heart disease and chronic venous insufficiency.  Patient c/o red and swollen LLE and she says the leg is warm and is sore.  She has a hx of BLE but sts the redness and swelling started almost a week ago.  Patient takes lasix as needed but doesn't normally take them due to urine urgency.  She ha taken lasix x4 days and she says it doesn't help.  Denies fever, N/V/D, SOB and chest pain.  She says she sleeps in a recliner b/c its hard getting into and out of bed.   Past Medical/Surgical History: Past Medical History:  Diagnosis Date  . Allergic rhinitis   . Atrial fibrillation ([place]) 09/26/2017   New-onset A. Fib now persistent  . BCC (basal cell carcinoma of skin) 04/10/2015   sees dermatologist  . CKD (chronic kidney disease) stage 3, GFR 30-59 ml/min ([place]) 04/26/2015  . Depression 10/17/2012  . Ectopic pregnancy   . GERD (gastroesophageal reflux disease)    hx hiatal hernia, has seen GI, ENT and allergist in the past  . Hyperlipidemia   . Hypertension   . OA (osteoarthritis)    knees and back, hx DDD, s/p remote lumbar disc surgery  . OAB (overactive bladder) 04/10/2015  . Obesity   . Osteoporosis    on prolia in the past  . Stroke [facility])    sees neurologist, hx memory loss, expressive aphasia  . Venous insufficiency     Past Surgical History:  Procedure Laterality Date  . ABDOMINAL HYSTERECTOMY    . APPENDECTOMY    . BREAST REDUCTION SURGERY    . LAPAROSCOPIC SALPINGOOPHERECTOMY    . NASAL MASS EXCISION     Basal Cell Cancer Excision  . NASAL RECONSTRUCTION    . TEE WITHOUT CARDIOVERSION  10/22/2012   Procedure: TRANSESOPHAGEAL ECHOCARDIOGRAM (TEE);  Surgeon: Josue Hector, MD;  Location: [facility] ENDOSCOPY;  Service: Cardiovascular;  Laterality: N/A;    Social  History:  reports that she has quit smoking. She has never used smokeless tobacco. She reports that she does not drink alcohol or use drugs.  Allergies: Allergies  Allergen Reactions  . Levaquin [Levofloxacin] Other (See Comments)    Severe intermittent arm pain  . Ace Inhibitors Cough  . Codeine Other (See Comments)    Unknown reaction - didn't feel well?  . Penicillins Other (See Comments)    Caused high fever Has patient had a PCN reaction causing immediate rash, facial/tongue/throat swelling, SOB or lightheadedness with hypotension: No Has patient had a PCN reaction causing severe rash involving mucus membranes or skin necrosis: No Has patient had a PCN reaction that required hospitalization: No Has patient had a PCN reaction occurring within the last 10 years: No If all of the above answers are "NO", then may proceed with Cephalosporin use.  . Sulfa Antibiotics Other (See Comments)    Possibly caused fever  . Sulfasalazine Other (See Comments)    Possibly caused fever    Family History:  Family History  Problem Relation Age of Onset  . Hypertension Father   . Hypertension Mother   . Cancer - Lung Brother      Current Outpatient Medications:  .  acetaminophen (TYLENOL) 500 MG tablet, Take 500 mg by mouth 2 (two) times  daily as needed for headache (pain). , Disp: , Rfl:  .  albuterol (PROVENTIL HFA;VENTOLIN HFA) 108 (90 Base) MCG/ACT inhaler, Inhale 2 puffs into the lungs every 6 (six) hours as needed., Disp: 1 Inhaler, Rfl: 0 .  apixaban (ELIQUIS) 5 MG TABS tablet, Take 1 tablet (5 mg total) by mouth 2 (two) times daily., Disp: 60 tablet, Rfl: 5 .  benzonatate (TESSALON) 100 MG capsule, TAKE 1 CAPSULE(100 MG) BY MOUTH THREE TIMES DAILY AS NEEDED, Disp: 20 capsule, Rfl: 0 .  calcitonin, salmon, (MIACALCIN/FORTICAL) 200 UNIT/ACT nasal spray, , Disp: , Rfl: 0 .  CARTIA XT 240 MG 24 hr capsule, TAKE 1 CAPSULE(240 MG) BY MOUTH DAILY, Disp: 30 capsule, Rfl: 5 .  carvedilol  (COREG) 3.125 MG tablet, TAKE 1 TABLET(3.125 MG) BY MOUTH TWICE DAILY WITH A MEAL, Disp: 60 tablet, Rfl: 5 .  cetirizine (ZYRTEC) 10 MG tablet, Take 10 mg by mouth at bedtime. , Disp: , Rfl:  .  cholecalciferol (VITAMIN D) 1000 units tablet, Take 2,000 Units by mouth 2 (two) times daily., Disp: , Rfl:  .  Cyanocobalamin (VITAMIN B12 PO), Take 1 tablet by mouth daily. , Disp: , Rfl:  .  denosumab (PROLIA) 60 MG/ML SOLN injection, Inject 60 mg into the skin every 6 (six) months. Administer in upper arm, thigh, or abdomen (last injection June or July 2018), Disp: , Rfl:  .  diclofenac sodium (VOLTAREN) 1 % GEL, Apply 1 application topically 4 (four) times daily as needed (arm pain/bursitis)., Disp: , Rfl:  .  DULoxetine (CYMBALTA) 20 MG capsule, TAKE 1 CAPSULE BY MOUTH ONCE DAILY, Disp: 90 capsule, Rfl: 1 .  fenofibrate (TRICOR) 145 MG tablet, Take 1 tablet (145 mg total) by mouth daily., Disp: 90 tablet, Rfl: 1 .  fluticasone (FLONASE) 50 MCG/ACT nasal spray, SHAKE LIQUID AND USE 1 SPRAY IN EACH NOSTRIL TWICE DAILY, Disp: 48 g, Rfl: 4 .  furosemide (LASIX) 20 MG tablet, Take 1 tablet (20 mg total) by mouth daily. As needed for swelling in the legs., Disp: 30 tablet, Rfl: 3 .  losartan (COZAAR) 50 MG tablet, TAKE 1 TABLET BY MOUTH ONCE DAILY, Disp: 90 tablet, Rfl: 1 .  Multiple Vitamins-Minerals (PRESERVISION AREDS PO), Take 1 tablet by mouth 3 (three) times daily. , Disp: , Rfl:  .  oxybutynin (DITROPAN-XL) 5 MG 24 hr tablet, TAKE 1 TABLET BY MOUTH ONCE DAILY, Disp: 90 tablet, Rfl: 1 .  pantoprazole (PROTONIX) 20 MG tablet, TAKE 1 TABLET BY MOUTH ONCE DAILY, Disp: 90 tablet, Rfl: 1 .  Polyethyl Glycol-Propyl Glycol (SYSTANE OP), Place 1 drop into both eyes 2 (two) times daily as needed (dry eyes)., Disp: , Rfl:  .  tiZANidine (ZANAFLEX) 2 MG tablet, Take 1 tablet (2 mg total) by mouth at bedtime as needed for muscle spasms., Disp: 20 tablet, Rfl: 0 .  traMADol (ULTRAM) 50 MG tablet, Take 1 tablet (50  mg total) by mouth every 6 (six) hours as needed., Disp: 30 tablet, Rfl: 0 .  doxycycline (VIBRA-TABS) 100 MG tablet, Take 1 tablet (100 mg total) by mouth 2 (two) times daily for 10 days., Disp: 20 tablet, Rfl: 0 .  fluticasone (FLOVENT HFA) 44 MCG/ACT inhaler, Inhale 2 puffs into the lungs 2 (two) times daily for 14 days., Disp: 1 Inhaler, Rfl: 0  Review of Systems:  Constitutional: Denies fever, chills, diaphoresis, appetite change and fatigue.  HEENT: Denies photophobia, eye pain, redness, hearing loss, ear pain, congestion, sore throat, rhinorrhea, sneezing, mouth sores, trouble swallowing,  neck pain, neck stiffness and tinnitus.   Respiratory: Denies SOB, DOE, cough, chest tightness,  and wheezing.   Cardiovascular: Denies chest pain, palpitations and leg swelling.  Gastrointestinal: Denies nausea, vomiting, abdominal pain, diarrhea, constipation, blood in stool and abdominal distention.  Genitourinary: Denies dysuria, urgency, frequency, hematuria, flank pain and difficulty urinating.  Endocrine: Denies: hot or cold intolerance, sweats, changes in hair or nails, polyuria, polydipsia. Musculoskeletal: Denies myalgias, back pain, joint swelling, arthralgias and gait problem.  Skin: Denies pallor, rash and wound. C/o red, warm and swollen LLE x1 week Neurological: Denies dizziness, seizures, syncope, weakness, light-headedness, numbness and headaches.  Hematological: Denies adenopathy. Easy bruising, personal or family bleeding history  Psychiatric/Behavioral: Denies suicidal ideation, mood changes, confusion, nervousness, sleep disturbance and agitation    Physical Exam: Vitals:   01/06/19 1602  BP: 120/78  Pulse: 93  Temp: 98.7 F (37.1 C)  TempSrc: Oral  SpO2: 96%    There is no height or weight on file to calculate BMI.    Constitutional: NAD, calm, comfortable Eyes: PERRL, lids and conjunctivae normal ENMT: Mucous membranes are moist. Posterior pharynx clear of any  exudate or lesions. Normal dentition. Tympanic membrane is pearly white, no erythema or bulging.  HOH Respiratory: clear to auscultation bilaterally, no crackles. Normal respiratory effort. No accessory muscle use.  RLL expiratory wheezing  Cardiovascular: irregular heartbeat, no murmurs / rubs / gallops. BLE with 3+ pitting edema. 1+ pedal pulses.   Skin: see below pic of left lower leg:      Psychiatric: Normal judgment and insight. Alert and oriented x 3. Normal mood.    Impression and Plan:  Cellulitis of left lower extremity -Doxy BID for 10 days -f/u with Dr. Maudie Mercury or with me in 2 weeks. -keep legs elevated.    Patient Instructions  Good seeing you this afternoon.  Instructions: -follow up in 2 weeks with Dr. Maudie Mercury -wear your compression stockings & elevate your legs -Start Doxycycline 100mg  by mouth two times a day for 10 days  Cellulitis, Adult  Cellulitis is a skin infection. The infected area is often warm, red, swollen, and sore. It occurs most often in the arms and lower legs. It is very important to get treated for this condition. What are the causes? This condition is caused by bacteria. The bacteria enter through a break in the skin, such as a cut, burn, insect bite, open sore, or crack. What increases the risk? This condition is more likely to occur in people who:  Have a weak body defense system (immune system).  Have open cuts, burns, bites, or scrapes on the skin.  Are older than 83 years of age.  Have a blood sugar problem (diabetes).  Have a long-lasting (chronic) liver disease (cirrhosis) or kidney disease.  Are very overweight (obese).  Have a skin problem, such as: ? Itchy rash (eczema). ? Slow movement of blood in the veins (venous stasis). ? Fluid buildup below the skin (edema).  Have been treated with high-energy rays (radiation).  Use IV drugs. What are the signs or symptoms? Symptoms of this condition include:  Skin that is: ? Red.  ? Streaking. ? Spotting. ? Swollen. ? Sore or painful when you touch it. ? Warm.  A fever.  Chills.  Blisters. How is this diagnosed? This condition is diagnosed based on:  Medical history.  Physical exam.  Blood tests.  Imaging tests. How is this treated? Treatment for this condition may include:  Medicines to treat infections or allergies.  Home care, such as: ? Rest. ? Placing cold or warm cloths (compresses) on the skin.  Hospital care, if the condition is very bad. Follow these instructions at home: Medicines  Take over-the-counter and prescription medicines only as told by your doctor.  If you were prescribed an antibiotic medicine, take it as told by your doctor. Do not stop taking it even if you start to feel better. General instructions   Drink enough fluid to keep your pee (urine) pale yellow.  Do not touch or rub the infected area.  Raise (elevate) the infected area above the level of your heart while you are sitting or lying down.  Place cold or warm cloths on the area as told by your doctor.  Keep all follow-up visits as told by your doctor. This is important. Contact a doctor if:  You have a fever.  You do not start to get better after 1-2 days of treatment.  Your bone or joint under the infected area starts to hurt after the skin has healed.  Your infection comes back. This can happen in the same area or another area.  You have a swollen bump in the area.  You have new symptoms.  You feel ill and have muscle aches and pains. Get help right away if:  Your symptoms get worse.  You feel very sleepy.  You throw up (vomit) or have watery poop (diarrhea) for a long time.  You see red streaks coming from the area.  Your red area gets larger.  Your red area turns dark in color. These symptoms may represent a serious problem that is an emergency. Do not wait to see if the symptoms will go away. Get medical help right away. Call your  local emergency services (911 in the [place] Do not drive yourself to the hospital. Summary  Cellulitis is a skin infection. The area is often warm, red, swollen, and sore.  This condition is treated with medicines, rest, and cold and warm cloths.  Take all medicines only as told by your doctor.  Tell your doctor if symptoms do not start to get better after 1-2 days of treatment. This information is not intended to replace advice given to you by your health care provider. Make sure you discuss any questions you have with your health care provider. Document Released: 04/01/2008 Document Revised: 03/05/2018 Document Reviewed: 03/05/2018 Elsevier Interactive Patient Education  2019 [place], RN DNP Student [facility]

## 2019-01-06 NOTE — Patient Instructions (Addendum)
Good seeing you this afternoon.  Instructions: -follow up in 2 weeks with Dr. Maudie Mercury -wear your compression stockings & elevate your legs -Start Doxycycline 100mg  by mouth two times a day for 10 days  Cellulitis, Adult  Cellulitis is a skin infection. The infected area is often warm, red, swollen, and sore. It occurs most often in the arms and lower legs. It is very important to get treated for this condition. What are the causes? This condition is caused by bacteria. The bacteria enter through a break in the skin, such as a cut, burn, insect bite, open sore, or crack. What increases the risk? This condition is more likely to occur in people who:  Have a weak body defense system (immune system).  Have open cuts, burns, bites, or scrapes on the skin.  Are older than 83 years of age.  Have a blood sugar problem (diabetes).  Have a long-lasting (chronic) liver disease (cirrhosis) or kidney disease.  Are very overweight (obese).  Have a skin problem, such as: ? Itchy rash (eczema). ? Slow movement of blood in the veins (venous stasis). ? Fluid buildup below the skin (edema).  Have been treated with high-energy rays (radiation).  Use IV drugs. What are the signs or symptoms? Symptoms of this condition include:  Skin that is: ? Red. ? Streaking. ? Spotting. ? Swollen. ? Sore or painful when you touch it. ? Warm.  A fever.  Chills.  Blisters. How is this diagnosed? This condition is diagnosed based on:  Medical history.  Physical exam.  Blood tests.  Imaging tests. How is this treated? Treatment for this condition may include:  Medicines to treat infections or allergies.  Home care, such as: ? Rest. ? Placing cold or warm cloths (compresses) on the skin.  Hospital care, if the condition is very bad. Follow these instructions at home: Medicines  Take over-the-counter and prescription medicines only as told by your doctor.  If you were prescribed an  antibiotic medicine, take it as told by your doctor. Do not stop taking it even if you start to feel better. General instructions   Drink enough fluid to keep your pee (urine) pale yellow.  Do not touch or rub the infected area.  Raise (elevate) the infected area above the level of your heart while you are sitting or lying down.  Place cold or warm cloths on the area as told by your doctor.  Keep all follow-up visits as told by your doctor. This is important. Contact a doctor if:  You have a fever.  You do not start to get better after 1-2 days of treatment.  Your bone or joint under the infected area starts to hurt after the skin has healed.  Your infection comes back. This can happen in the same area or another area.  You have a swollen bump in the area.  You have new symptoms.  You feel ill and have muscle aches and pains. Get help right away if:  Your symptoms get worse.  You feel very sleepy.  You throw up (vomit) or have watery poop (diarrhea) for a long time.  You see red streaks coming from the area.  Your red area gets larger.  Your red area turns dark in color. These symptoms may represent a serious problem that is an emergency. Do not wait to see if the symptoms will go away. Get medical help right away. Call your local emergency services (911 in the [place] Do not drive yourself to  the hospital. Summary  Cellulitis is a skin infection. The area is often warm, red, swollen, and sore.  This condition is treated with medicines, rest, and cold and warm cloths.  Take all medicines only as told by your doctor.  Tell your doctor if symptoms do not start to get better after 1-2 days of treatment. This information is not intended to replace advice given to you by your health care provider. Make sure you discuss any questions you have with your health care provider. Document Released: 04/01/2008 Document Revised: 03/05/2018 Document Reviewed: 03/05/2018 Elsevier  Interactive Patient Education  2019 [employer].

## 2019-01-08 ENCOUNTER — Other Ambulatory Visit: Payer: Self-pay | Admitting: *Deleted

## 2019-01-08 MED ORDER — BENZONATATE 100 MG PO CAPS: ORAL_CAPSULE | ORAL | 0 refills | Status: DC

## 2019-01-08 NOTE — Telephone Encounter (Signed)
Please just make sure no sick symptoms that would warrant visit since calling in refill request of cough capsule.

## 2019-01-09 ENCOUNTER — Other Ambulatory Visit: Payer: Self-pay | Admitting: Family Medicine

## 2019-01-11 ENCOUNTER — Other Ambulatory Visit: Payer: Self-pay

## 2019-01-11 ENCOUNTER — Encounter (INDEPENDENT_AMBULATORY_CARE_PROVIDER_SITE_OTHER): Payer: Medicare Other | Admitting: Ophthalmology

## 2019-01-11 DIAGNOSIS — H348311 Tributary (branch) retinal vein occlusion, right eye, with retinal neovascularization: Secondary | ICD-10-CM | POA: Diagnosis not present

## 2019-01-11 DIAGNOSIS — H35033 Hypertensive retinopathy, bilateral: Secondary | ICD-10-CM | POA: Diagnosis not present

## 2019-01-11 DIAGNOSIS — I10 Essential (primary) hypertension: Secondary | ICD-10-CM

## 2019-01-11 DIAGNOSIS — H43813 Vitreous degeneration, bilateral: Secondary | ICD-10-CM

## 2019-01-11 DIAGNOSIS — H353122 Nonexudative age-related macular degeneration, left eye, intermediate dry stage: Secondary | ICD-10-CM

## 2019-01-19 ENCOUNTER — Telehealth (INDEPENDENT_AMBULATORY_CARE_PROVIDER_SITE_OTHER): Payer: Medicare Other | Admitting: Family Medicine

## 2019-01-19 ENCOUNTER — Other Ambulatory Visit: Payer: Self-pay

## 2019-01-19 DIAGNOSIS — I4891 Unspecified atrial fibrillation: Secondary | ICD-10-CM

## 2019-01-19 DIAGNOSIS — D649 Anemia, unspecified: Secondary | ICD-10-CM

## 2019-01-19 DIAGNOSIS — I10 Essential (primary) hypertension: Secondary | ICD-10-CM | POA: Diagnosis not present

## 2019-01-19 DIAGNOSIS — R600 Localized edema: Secondary | ICD-10-CM

## 2019-01-19 DIAGNOSIS — I872 Venous insufficiency (chronic) (peripheral): Secondary | ICD-10-CM

## 2019-01-19 NOTE — Progress Notes (Signed)
Virtual Visit via Telephone Note  I connected with Charlene Wade on 01/19/19 at  4:45 PM EDT by telephone and verified that I am speaking with the correct person using two identifiers.   I discussed the limitations, risks, security and privacy concerns of performing an evaluation and management service by telephone and the availability of in person appointments. I also discussed with the patient that there may be a patient responsible charge related to this service. The patient expressed understanding and agreed to proceed.  Location patient: home Location provider: work or home office Participants present for the call: patient, provider, patient's caregiver Charlene Wade Patient did not have a visit in the prior 7 days to address this/these issue(s).   History of Present Illness:  PMH chronic lower extremity edema, recurrent venous stasis dermatitis and cellulitis. Saw Dr. Maurie Boettcher ago and treated with abx for possible L LE cellulitis. Pt had not been taking the lasix. Now doing better.  Caregiver feels looks sig better. Still with swelling. Does not like to take lasix as increases urinary frequency. Not using compression currently. Has been elevating. Denies fevers, malaise, pain.   Observations/Objective: Patient sounds cheerful and well on the phone. I do not appreciate any SOB. Speech and thought processing are grossly intact. Patient reported vitals: none  Assessment and Plan: LE Edema Venous Stasis Dermatitis Cellulitis - resolved  Opted for lasix daily for 1-2 weeks, elevation, add back compression Due for labs, she is rightfully concerned about coming into clinic given COVID19 situation. We have scheduled a lab visit in 2 weeks. Hopefully will have agood proces sin place by then and no sick patients in lab so can come for this visit. She will schedule TOC visit with Dr. Ethlyn Gallery in may and cancel visit with me in APril as I will be transitioning out of  clinical medicine.  I did not refer this patient for an OV in the next 24 hours for this/these issue(s).  I discussed the assessment and treatment plan with the patient. The patient was provided an opportunity to ask questions and all were answered. The patient agreed with the plan and demonstrated an understanding of the instructions.   The patient was advised to call back or seek an in-person evaluation if the symptoms worsen or if the condition fails to improve as anticipated.  I provided 14 minutes of non-face-to-face time during this encounter.   Lucretia Kern, DO

## 2019-01-20 ENCOUNTER — Telehealth: Payer: Self-pay | Admitting: *Deleted

## 2019-01-20 NOTE — Telephone Encounter (Signed)
I left a message for the pt to return my call. 

## 2019-01-20 NOTE — Telephone Encounter (Signed)
[name], caregiver called back and schedule a lab appt in 2 weeks.  I cancelled the appt in April and scheduled a transfer of care visit with Dr Ethlyn Gallery for 5/27 at 2:30pm.  Garnett Farm asked if the pt could be called for the appt as she does not have access for a Webex appt and this was entered in the appt notes.

## 2019-01-20 NOTE — Telephone Encounter (Signed)
-----   Message from Lucretia Kern, DO sent at 01/19/2019  5:03 PM EDT ----- Please help Jolisa with the following:  1) lab visit in 2 weeks  2) cancel follow up with me in April  3) schedule transfer of care virtual visit with Dr. Ethlyn Gallery in May  Thank you!

## 2019-01-25 ENCOUNTER — Ambulatory Visit (INDEPENDENT_AMBULATORY_CARE_PROVIDER_SITE_OTHER): Payer: Medicare Other

## 2019-01-25 ENCOUNTER — Telehealth: Payer: Self-pay

## 2019-01-25 ENCOUNTER — Other Ambulatory Visit: Payer: Self-pay

## 2019-01-25 DIAGNOSIS — M810 Age-related osteoporosis without current pathological fracture: Secondary | ICD-10-CM | POA: Diagnosis not present

## 2019-01-25 MED ORDER — DENOSUMAB 60 MG/ML ~~LOC~~ SOSY
60.0000 mg | PREFILLED_SYRINGE | Freq: Once | SUBCUTANEOUS | Status: AC
  Administered 2019-01-25: 60 mg via SUBCUTANEOUS

## 2019-01-25 NOTE — Telephone Encounter (Signed)
Left VM requesting patient call back do get schedulued for nurse visit for Prolia- she owes $0 and is ready to be scheduled

## 2019-01-25 NOTE — Progress Notes (Signed)
Per orders of Dr. Gherghe injection of Prolia given today by L. Lemma Tetro CMA . Patient tolerated injection well. 

## 2019-01-29 ENCOUNTER — Telehealth: Payer: Self-pay | Admitting: Family Medicine

## 2019-01-29 NOTE — Telephone Encounter (Signed)
Copied from [place] [phone]. Topic: Quick Communication - See Telephone Encounter >> Jan 29, 2019  4:00 PM Blase Mess A wrote: CRM for notification. See Telephone encounter for: 01/29/19.  Patient is returning Mechele Claude message regarding the patient appt. Please advise. Thank you [phone] (H)

## 2019-02-02 DIAGNOSIS — G894 Chronic pain syndrome: Secondary | ICD-10-CM | POA: Diagnosis not present

## 2019-02-02 DIAGNOSIS — Z79891 Long term (current) use of opiate analgesic: Secondary | ICD-10-CM | POA: Diagnosis not present

## 2019-02-02 NOTE — Telephone Encounter (Signed)
I left a message for the pt to return my call. 

## 2019-02-03 ENCOUNTER — Other Ambulatory Visit: Payer: Self-pay

## 2019-02-03 ENCOUNTER — Other Ambulatory Visit (INDEPENDENT_AMBULATORY_CARE_PROVIDER_SITE_OTHER): Payer: Medicare Other

## 2019-02-03 ENCOUNTER — Telehealth: Payer: Self-pay | Admitting: *Deleted

## 2019-02-03 DIAGNOSIS — R600 Localized edema: Secondary | ICD-10-CM

## 2019-02-03 DIAGNOSIS — I10 Essential (primary) hypertension: Secondary | ICD-10-CM | POA: Diagnosis not present

## 2019-02-03 DIAGNOSIS — D649 Anemia, unspecified: Secondary | ICD-10-CM | POA: Diagnosis not present

## 2019-02-03 LAB — BASIC METABOLIC PANEL
BUN: 18 mg/dL (ref 6–23)
CO2: 24 mEq/L (ref 19–32)
Calcium: 7.6 mg/dL — ABNORMAL LOW (ref 8.4–10.5)
Chloride: 109 mEq/L (ref 96–112)
Creatinine, Ser: 1.11 mg/dL (ref 0.40–1.20)
GFR: 46.27 mL/min — ABNORMAL LOW (ref >60.00)
Glucose, Bld: 97 mg/dL (ref 70–99)
Potassium: 5.1 mEq/L (ref 3.5–5.1)
Sodium: 138 mEq/L (ref 135–145)

## 2019-02-03 LAB — CBC
HCT: 29.3 % — ABNORMAL LOW (ref 36.0–46.0)
Hemoglobin: 9.3 g/dL — ABNORMAL LOW (ref 12.0–15.0)
MCHC: 31.7 g/dL (ref 30.0–36.0)
MCV: 82.9 fl (ref 78.0–100.0)
Platelets: 325 10*3/uL (ref 150.0–400.0)
RBC: 3.54 Mil/uL — ABNORMAL LOW (ref 3.87–5.11)
RDW: 16.2 % — ABNORMAL HIGH (ref 11.5–15.5)
WBC: 5.6 10*3/uL (ref 4.0–10.5)

## 2019-02-03 NOTE — Telephone Encounter (Signed)
Garnett Farm called back and I informed her I did not call the pt.  Charlene Wade stated she did call the pt in regards to the lab appt for today and was asked to call Garnett Farm with more details.

## 2019-02-03 NOTE — Telephone Encounter (Signed)
Copied from [place] [id number]. Topic: General - Other >> Feb 02, 2019 10:53 AM Yvette Rack wrote: Reason for CRM: Pt returned call to [place]. Pt stated she will be at the home # [phone] until 1:30 pm.

## 2019-02-03 NOTE — Telephone Encounter (Signed)
See prior note

## 2019-02-03 NOTE — Telephone Encounter (Signed)
I left a message for the pt to return my call. 

## 2019-02-05 ENCOUNTER — Other Ambulatory Visit: Payer: Self-pay | Admitting: Family Medicine

## 2019-02-11 NOTE — Addendum Note (Signed)
Addended by: Agnes Lawrence on: 02/11/2019 03:03 PM   Modules accepted: Orders

## 2019-02-12 ENCOUNTER — Telehealth: Payer: Self-pay | Admitting: Oncology

## 2019-02-12 NOTE — Telephone Encounter (Signed)
A new hem appt has been scheduled for the pt to see Dr. Alen Blew on 5/1 at 2:15pm. Appt date and time has been given to the pt's caregiver [facility]. Garnett Farm has been made aware to arrive 15 minutes early and that we will provide an escort for Ms. Hirschmann since she's in a wheel chair.

## 2019-02-22 ENCOUNTER — Other Ambulatory Visit: Payer: Self-pay | Admitting: Family Medicine

## 2019-02-22 NOTE — Telephone Encounter (Signed)
Please see if she is still having symptoms? This is cough capsule so just a prn medication. If still coughing please get more details and may need to discuss further.

## 2019-02-22 NOTE — Telephone Encounter (Signed)
Please advise. Should the patient continue this medication? 

## 2019-02-23 ENCOUNTER — Ambulatory Visit: Payer: Medicare Other | Admitting: Family Medicine

## 2019-02-23 NOTE — Telephone Encounter (Signed)
Thanks. Please give her a call.  If feels like her normal baseline cough - ok to do one more rx of #20. If seems new or changed from her usual, any SOB, thick mucus, etc then please do phone or virtual visit with me in next week. Thanks.

## 2019-02-23 NOTE — Telephone Encounter (Signed)
I called the caregiver, Garnett Farm and she stated the pt still has a productive cough with clear sputum.  Denies a fever or chills and she states the pt sleeps in a recliner and has to cough up mucus. Message sent to Dr Maudie Mercury as Dr Ethlyn Gallery is out of the office.

## 2019-02-24 NOTE — Telephone Encounter (Signed)
I called Charlene Wade and informed her of the message below.  Charlene Wade stated the pt is still having swelling in the left leg.  I offered an appt for tomorrow with Dr Maudie Mercury and she stated she will not be available to help with the phone visit.  Appt scheduled with [facility] for 5/1.

## 2019-02-26 ENCOUNTER — Encounter: Payer: Self-pay | Admitting: Adult Health

## 2019-02-26 ENCOUNTER — Inpatient Hospital Stay: Payer: Medicare Other

## 2019-02-26 ENCOUNTER — Ambulatory Visit (INDEPENDENT_AMBULATORY_CARE_PROVIDER_SITE_OTHER): Payer: Medicare Other | Admitting: Adult Health

## 2019-02-26 ENCOUNTER — Inpatient Hospital Stay: Payer: Medicare Other | Attending: Oncology | Admitting: Oncology

## 2019-02-26 ENCOUNTER — Other Ambulatory Visit: Payer: Self-pay

## 2019-02-26 VITALS — BP 146/63 | HR 100 | Temp 97.9°F | Resp 18 | Ht <= 58 in | Wt 155.6 lb

## 2019-02-26 DIAGNOSIS — Z801 Family history of malignant neoplasm of trachea, bronchus and lung: Secondary | ICD-10-CM

## 2019-02-26 DIAGNOSIS — Z79899 Other long term (current) drug therapy: Secondary | ICD-10-CM | POA: Insufficient documentation

## 2019-02-26 DIAGNOSIS — Z8249 Family history of ischemic heart disease and other diseases of the circulatory system: Secondary | ICD-10-CM

## 2019-02-26 DIAGNOSIS — D649 Anemia, unspecified: Secondary | ICD-10-CM | POA: Diagnosis not present

## 2019-02-26 DIAGNOSIS — E669 Obesity, unspecified: Secondary | ICD-10-CM | POA: Diagnosis not present

## 2019-02-26 DIAGNOSIS — I10 Essential (primary) hypertension: Secondary | ICD-10-CM | POA: Diagnosis not present

## 2019-02-26 DIAGNOSIS — E538 Deficiency of other specified B group vitamins: Secondary | ICD-10-CM

## 2019-02-26 DIAGNOSIS — N289 Disorder of kidney and ureter, unspecified: Secondary | ICD-10-CM | POA: Diagnosis not present

## 2019-02-26 DIAGNOSIS — L03119 Cellulitis of unspecified part of limb: Secondary | ICD-10-CM

## 2019-02-26 DIAGNOSIS — Z7901 Long term (current) use of anticoagulants: Secondary | ICD-10-CM | POA: Insufficient documentation

## 2019-02-26 DIAGNOSIS — D509 Iron deficiency anemia, unspecified: Secondary | ICD-10-CM | POA: Insufficient documentation

## 2019-02-26 DIAGNOSIS — I4891 Unspecified atrial fibrillation: Secondary | ICD-10-CM

## 2019-02-26 LAB — CMP (CANCER CENTER ONLY)
ALT: <6 U/L (ref 0–44)
AST: 19 U/L (ref 15–41)
Albumin: 3.1 g/dL — ABNORMAL LOW (ref 3.5–5.0)
Alkaline Phosphatase: 55 U/L (ref 38–126)
Anion gap: 9 (ref 5–15)
BUN: 21 mg/dL (ref 8–23)
CO2: 25 mmol/L (ref 22–32)
Calcium: 8.4 mg/dL — ABNORMAL LOW (ref 8.9–10.3)
Chloride: 109 mmol/L (ref 98–111)
Creatinine: 1.72 mg/dL — ABNORMAL HIGH (ref 0.44–1.00)
GFR, Est AFR Am: 30 mL/min — ABNORMAL LOW
GFR, Estimated: 26 mL/min — ABNORMAL LOW
Glucose, Bld: 94 mg/dL (ref 70–99)
Potassium: 4.4 mmol/L (ref 3.5–5.1)
Sodium: 143 mmol/L (ref 135–145)
Total Bilirubin: 0.3 mg/dL (ref 0.3–1.2)
Total Protein: 6.7 g/dL (ref 6.5–8.1)

## 2019-02-26 LAB — CBC WITH DIFFERENTIAL (CANCER CENTER ONLY)
Abs Immature Granulocytes: 0.01 10*3/uL (ref 0.00–0.07)
Basophils Absolute: 0 10*3/uL (ref 0.0–0.1)
Basophils Relative: 1 %
Eosinophils Absolute: 0.4 10*3/uL (ref 0.0–0.5)
Eosinophils Relative: 7 %
HCT: 29.8 % — ABNORMAL LOW (ref 36.0–46.0)
Hemoglobin: 8.6 g/dL — ABNORMAL LOW (ref 12.0–15.0)
Immature Granulocytes: 0 %
Lymphocytes Relative: 25 %
Lymphs Abs: 1.6 10*3/uL (ref 0.7–4.0)
MCH: 24.7 pg — ABNORMAL LOW (ref 26.0–34.0)
MCHC: 28.9 g/dL — ABNORMAL LOW (ref 30.0–36.0)
MCV: 85.6 fL (ref 80.0–100.0)
Monocytes Absolute: 0.8 10*3/uL (ref 0.1–1.0)
Monocytes Relative: 13 %
Neutro Abs: 3.5 10*3/uL (ref 1.7–7.7)
Neutrophils Relative %: 54 %
Platelet Count: 372 10*3/uL (ref 150–400)
RBC: 3.48 MIL/uL — ABNORMAL LOW (ref 3.87–5.11)
RDW: 15.9 % — ABNORMAL HIGH (ref 11.5–15.5)
WBC Count: 6.5 10*3/uL (ref 4.0–10.5)
nRBC: 0 % (ref 0.0–0.2)

## 2019-02-26 LAB — VITAMIN B12: Vitamin B-12: 744 pg/mL (ref 180–914)

## 2019-02-26 MED ORDER — DOXYCYCLINE HYCLATE 100 MG PO CAPS: 100.0000 mg | ORAL_CAPSULE | Freq: Two times a day (BID) | ORAL | 0 refills | Status: DC

## 2019-02-26 NOTE — Progress Notes (Signed)
Reason for the request: Anemia  HPI: I was asked by Dr. Maudie Mercury to evaluate Charlene Wade for diagnosis of anemia.  She is 85 year old woman with history of atrial fibrillation, hypertension as well as renal insufficiency.  He had a routine laboratory testing on April 8 of 2020 which showed hemoglobin of 9.3, white cell count of 5.6 with platelet count of 325.  RDW slightly elevated at 16.2.  Previous hemoglobin in September 2019 was 10.5 with a fluctuating hemoglobin tween 10 and 11 in the last 2 years.  Iron studies in November 2018 showed a low iron level of 24 with saturation of 9% although her ferritin was 79.  She is asymptomatic from these findings and does not report any recent complaints.  She denies any hematochezia, melena or hemoptysis.  She is limited in her ambulation using a walker at home without any recent falls or syncope.  She does not report any headaches, blurry vision, syncope or seizures. Does not report any fevers, chills or sweats.  Does not report any cough, wheezing or hemoptysis.  Does not report any chest pain, palpitation, orthopnea or leg edema.  Does not report any nausea, vomiting or abdominal pain.  Does not report any constipation or diarrhea.  Does not report any skeletal complaints.    Does not report frequency, urgency or hematuria.  Does not report any skin rashes or lesions. Does not report any heat or cold intolerance.  Does not report any lymphadenopathy or petechiae.  Does not report any anxiety or depression.  Remaining review of systems is negative.    Past Medical History:  Diagnosis Date  . Allergic rhinitis   . Atrial fibrillation ([place]) 09/26/2017   New-onset A. Fib now persistent  . BCC (basal cell carcinoma of skin) 04/10/2015   sees dermatologist  . CKD (chronic kidney disease) stage 3, GFR 30-59 ml/min ([place]) 04/26/2015  . Depression 10/17/2012  . Ectopic pregnancy   . GERD (gastroesophageal reflux disease)    hx hiatal hernia, has seen GI, ENT and  allergist in the past  . Hyperlipidemia   . Hypertension   . OA (osteoarthritis)    knees and back, hx DDD, s/p remote lumbar disc surgery  . OAB (overactive bladder) 04/10/2015  . Obesity   . Osteoporosis    on prolia in the past  . Stroke [facility])    sees neurologist, hx memory loss, expressive aphasia  . Venous insufficiency   :  Past Surgical History:  Procedure Laterality Date  . ABDOMINAL HYSTERECTOMY    . APPENDECTOMY    . BREAST REDUCTION SURGERY    . LAPAROSCOPIC SALPINGOOPHERECTOMY    . NASAL MASS EXCISION     Basal Cell Cancer Excision  . NASAL RECONSTRUCTION    . TEE WITHOUT CARDIOVERSION  10/22/2012   Procedure: TRANSESOPHAGEAL ECHOCARDIOGRAM (TEE);  Surgeon: Josue Hector, MD;  Location: [facility] ENDOSCOPY;  Service: Cardiovascular;  Laterality: N/A;  :   Current Outpatient Medications:  .  acetaminophen (TYLENOL) 500 MG tablet, Take 500 mg by mouth 2 (two) times daily as needed for headache (pain). , Disp: , Rfl:  .  albuterol (PROVENTIL HFA;VENTOLIN HFA) 108 (90 Base) MCG/ACT inhaler, Inhale 2 puffs into the lungs every 6 (six) hours as needed., Disp: 1 Inhaler, Rfl: 0 .  apixaban (ELIQUIS) 5 MG TABS tablet, Take 1 tablet (5 mg total) by mouth 2 (two) times daily., Disp: 60 tablet, Rfl: 5 .  benzonatate (TESSALON) 100 MG capsule, TAKE 1 CAPSULE(100 MG) BY MOUTH  THREE TIMES DAILY AS NEEDED, Disp: 30 capsule, Rfl: 0 .  calcitonin, salmon, (MIACALCIN/FORTICAL) 200 UNIT/ACT nasal spray, , Disp: , Rfl: 0 .  CARTIA XT 240 MG 24 hr capsule, TAKE 1 CAPSULE(240 MG) BY MOUTH DAILY, Disp: 30 capsule, Rfl: 5 .  carvedilol (COREG) 3.125 MG tablet, TAKE 1 TABLET(3.125 MG) BY MOUTH TWICE DAILY WITH A MEAL, Disp: 60 tablet, Rfl: 5 .  cetirizine (ZYRTEC) 10 MG tablet, Take 10 mg by mouth at bedtime. , Disp: , Rfl:  .  cholecalciferol (VITAMIN D) 1000 units tablet, Take 2,000 Units by mouth 2 (two) times daily., Disp: , Rfl:  .  Cyanocobalamin (VITAMIN B12 PO), Take 1 tablet by mouth  daily. , Disp: , Rfl:  .  denosumab (PROLIA) 60 MG/ML SOLN injection, Inject 60 mg into the skin every 6 (six) months. Administer in upper arm, thigh, or abdomen (last injection June or July 2018), Disp: , Rfl:  .  diclofenac sodium (VOLTAREN) 1 % GEL, Apply 1 application topically 4 (four) times daily as needed (arm pain/bursitis)., Disp: , Rfl:  .  DULoxetine (CYMBALTA) 20 MG capsule, TAKE 1 CAPSULE BY MOUTH ONCE DAILY, Disp: 90 capsule, Rfl: 1 .  fenofibrate (TRICOR) 145 MG tablet, TAKE 1 TABLET(145 MG) BY MOUTH DAILY, Disp: 90 tablet, Rfl: 1 .  fluticasone (FLONASE) 50 MCG/ACT nasal spray, SHAKE LIQUID AND USE 1 SPRAY IN EACH NOSTRIL TWICE DAILY, Disp: 48 g, Rfl: 4 .  fluticasone (FLOVENT HFA) 44 MCG/ACT inhaler, Inhale 2 puffs into the lungs 2 (two) times daily for 14 days., Disp: 1 Inhaler, Rfl: 0 .  furosemide (LASIX) 20 MG tablet, Take 1 tablet (20 mg total) by mouth daily. As needed for swelling in the legs., Disp: 30 tablet, Rfl: 3 .  losartan (COZAAR) 50 MG tablet, TAKE 1 TABLET BY MOUTH ONCE DAILY, Disp: 90 tablet, Rfl: 1 .  Multiple Vitamins-Minerals (PRESERVISION AREDS PO), Take 1 tablet by mouth 3 (three) times daily. , Disp: , Rfl:  .  oxybutynin (DITROPAN-XL) 5 MG 24 hr tablet, TAKE 1 TABLET BY MOUTH ONCE DAILY, Disp: 90 tablet, Rfl: 1 .  pantoprazole (PROTONIX) 20 MG tablet, TAKE 1 TABLET BY MOUTH ONCE DAILY, Disp: 90 tablet, Rfl: 1 .  Polyethyl Glycol-Propyl Glycol (SYSTANE OP), Place 1 drop into both eyes 2 (two) times daily as needed (dry eyes)., Disp: , Rfl:  .  tiZANidine (ZANAFLEX) 2 MG tablet, Take 1 tablet (2 mg total) by mouth at bedtime as needed for muscle spasms., Disp: 20 tablet, Rfl: 0 .  traMADol (ULTRAM) 50 MG tablet, Take 1 tablet (50 mg total) by mouth every 6 (six) hours as needed., Disp: 30 tablet, Rfl: 0:  Allergies  Allergen Reactions  . Levaquin [Levofloxacin] Other (See Comments)    Severe intermittent arm pain  . Ace Inhibitors Cough  . Codeine Other  (See Comments)    Unknown reaction - didn't feel well?  . Penicillins Other (See Comments)    Caused high fever Has patient had a PCN reaction causing immediate rash, facial/tongue/throat swelling, SOB or lightheadedness with hypotension: No Has patient had a PCN reaction causing severe rash involving mucus membranes or skin necrosis: No Has patient had a PCN reaction that required hospitalization: No Has patient had a PCN reaction occurring within the last 10 years: No If all of the above answers are "NO", then may proceed with Cephalosporin use.  . Sulfa Antibiotics Other (See Comments)    Possibly caused fever  . Sulfasalazine Other (See Comments)  Possibly caused fever  :  Family History  Problem Relation Age of Onset  . Hypertension Father   . Hypertension Mother   . Cancer - Lung Brother   :  Social History   Socioeconomic History  . Marital status: Widowed    Spouse name: Not on file  . Number of children: 2  . Years of education: 25  . Highest education level: Not on file  Occupational History  . Occupation: retired  Scientific laboratory technician  . Financial resource strain: Not on file  . Food insecurity:    Worry: Not on file    Inability: Not on file  . Transportation needs:    Medical: Not on file    Non-medical: Not on file  Tobacco Use  . Smoking status: Former Research scientist (life sciences)  . Smokeless tobacco: Never Used  Substance and Sexual Activity  . Alcohol use: No  . Drug use: No  . Sexual activity: Never  Lifestyle  . Physical activity:    Days per week: Not on file    Minutes per session: Not on file  . Stress: Not on file  Relationships  . Social connections:    Talks on phone: Not on file    Gets together: Not on file    Attends religious service: Not on file    Active member of club or organization: Not on file    Attends meetings of clubs or organizations: Not on file    Relationship status: Not on file  . Intimate partner violence:    Fear of current or ex  partner: Not on file    Emotionally abused: Not on file    Physically abused: Not on file    Forced sexual activity: Not on file  Other Topics Concern  . Not on file  Social History Narrative   Patient is single with 2 children.   Patient is right handed.   Patient has 12 th grade education.   Patient does not drink caffeine.  :  Pertinent items are noted in HPI.  Exam: Blood pressure (!) 146/63, pulse 100, temperature 97.9 F (36.6 C), temperature source Oral, resp. rate 18, height '4\' 8"'  (1.422 m), weight 155 lb 9.6 oz (70.6 kg), SpO2 96 %.   ECOG 3  General appearance: alert and cooperative appeared without distress. Head: atraumatic without any abnormalities. Eyes: conjunctivae/corneas clear. PERRL.  Sclera anicteric. Throat: lips, mucosa, and tongue normal; without oral thrush or ulcers. Resp: clear to auscultation bilaterally without rhonchi, wheezes or dullness to percussion. Cardio: regular rate and rhythm, S1, S2 normal, no murmur, click, rub or gallop GI: soft, non-tender; bowel sounds normal; no masses,  no organomegaly Skin: Skin color, texture, turgor normal. No rashes or lesions Lymph nodes: Cervical, supraclavicular, and axillary nodes normal. Neurologic: Grossly normal without any motor, sensory or deep tendon reflexes. Musculoskeletal: No joint deformity or effusion.  CBC    Component Value Date/Time   WBC 5.6 02/03/2019 1449   RBC 3.54 (L) 02/03/2019 1449   HGB 9.3 (L) 02/03/2019 1449   HCT 29.3 (L) 02/03/2019 1449   PLT 325.0 02/03/2019 1449   MCV 82.9 02/03/2019 1449   MCH 28.9 09/28/2017 0653   MCHC 31.7 02/03/2019 1449   RDW 16.2 (H) 02/03/2019 1449   LYMPHSABS 2.1 09/26/2017 1504   MONOABS 1.4 (H) 09/26/2017 1504   EOSABS 0.1 09/26/2017 1504   BASOSABS 0.0 09/26/2017 1504     Chemistry      Component Value Date/Time   NA 138  02/03/2019 1449   K 5.1 02/03/2019 1449   CL 109 02/03/2019 1449   CO2 24 02/03/2019 1449   BUN 18 02/03/2019 1449    CREATININE 1.11 02/03/2019 1449   CREATININE 1.22 (H) 03/21/2017 1646      Component Value Date/Time   CALCIUM 7.6 (L) 02/03/2019 1449   ALKPHOS 53 09/26/2017 1504   AST 25 09/26/2017 1504   ALT 9 (L) 09/26/2017 1504   BILITOT 0.4 09/26/2017 1504       Assessment and Plan:    85 year old woman with the following:  1.  Normocytic, normochromic anemia detected in April 2020.  At that time her hemoglobin was 9.3 which is slightly lower than baseline which has been between 10 and 11 the last 2 years.  Her MCV is normal at 82.9 but does have elevated RDW.  She had normal white cell count and platelet counts.  The differential diagnosis was reviewed today which include anemia related to renal insufficiency, iron deficiency, chronic disease anemia and possibly early MDS.  Plasma cell disorder is considered less likely at this time with normal electrolytes as well as calcium.  From a management standpoint, I will repeat iron studies, Y78, folic acid and a serum protein electrophoresis and recommend replacing any deficiency noted.  I do not see any need for intervention at this time including transfusion or growth factor support.  I do not see any need for bone marrow biopsy.  I have recommended surveillance at this time with repeat laboratory testing in 6 months.  2.  Follow-up: We will be in 6 months for repeat evaluation.  40  minutes was spent with the patient face-to-face today.  More than 50% of time was spent on reviewing laboratory data, differential diagnosis and answering questions regarding future plan of care.   Thank you for the referral. A copy of this consult has been forwarded to the requesting physician.

## 2019-02-26 NOTE — Progress Notes (Signed)
Virtual Visit via Telephone Note  I connected with Charlene Wade on 02/26/19 at  4:00 PM EDT by telephone and verified that I am speaking with the correct person using two identifiers.   I discussed the limitations, risks, security and privacy concerns of performing an evaluation and management service by telephone and the availability of in person appointments. I also discussed with the patient that there may be a patient responsible charge related to this service. The patient expressed understanding and agreed to proceed.  Location patient: home Location provider: work or home office Participants present for the call: patient, provider, daughter.  Patient did not have a visit in the prior 7 days to address this/these issue(s).   History of Present Illness: Virtual Visit via Video failed   Pleasant 85 year old female with past medical history significant for heart disease and chronic venous insufficiency.  She is complaining of bilateral lower extremity edema with redness and warmth to bilateral lower extremities, left worse than right.  She reports that she has had chronic issues for some time now with lower extremity edema and takes Lasix as needed but does not normally take them due to having to use Strohm all the time.  She last took Lasix yesterday and reports that she noticed some improvement in the lower extremity edema.  She was seen on 01/06/2019 by another provider in the office and diagnosed with cellulitis.  She was started on doxycycline for this.  She reports that her symptoms are similar to when she was last seen for this issue.  He does report that her symptoms resolved with doxycycline but started noticing coming back approximately 1 week ago.  She does sleep in a recliner and sits in a chair most of the day because it is hard getting into and out of the bed and ambulation can be difficult.  She does wear compression socks  He denies any weeping of the lower extremities,  fevers, or chills.  She is not feeling acutely ill   Observations/Objective: Patient sounds cheerful and well on the phone. I do not appreciate any SOB. Speech and thought processing are grossly intact. Patient reported vitals:  Assessment and Plan: 1. Cellulitis of lower extremity, unspecified laterality - doxycycline (VIBRAMYCIN) 100 MG capsule; Take 1 capsule (100 mg total) by mouth 2 (two) times daily.  Dispense: 20 capsule; Refill: 0  Encouraged her to take her Lasix more often, this is likely why she continues to get cellulitis.  Elevate the legs when in her chair, try walking around the house more often throughout the day, and continue to wear compression socks.  She and her daughter vies to follow-up if no improvement in the cellulitis and lower extremity edema by the end of the weekend.   Follow Up Instructions:  I did not refer this patient for an OV in the next 24 hours for this/these issue(s).  I discussed the assessment and treatment plan with the patient. The patient was provided an opportunity to ask questions and all were answered. The patient agreed with the plan and demonstrated an understanding of the instructions.   The patient was advised to call back or seek an in-person evaluation if the symptoms worsen or if the condition fails to improve as anticipated.  I provided 23 minutes of non-face-to-face time during this encounter.   Dorothyann Peng, NP

## 2019-03-01 LAB — MULTIPLE MYELOMA PANEL, SERUM
Albumin SerPl Elph-Mcnc: 3 g/dL (ref 2.9–4.4)
Albumin/Glob SerPl: 1 (ref 0.7–1.7)
Alpha 1: 0.3 g/dL (ref 0.0–0.4)
Alpha2 Glob SerPl Elph-Mcnc: 0.9 g/dL (ref 0.4–1.0)
B-Globulin SerPl Elph-Mcnc: 1.2 g/dL (ref 0.7–1.3)
Gamma Glob SerPl Elph-Mcnc: 0.8 g/dL (ref 0.4–1.8)
Globulin, Total: 3.2 g/dL (ref 2.2–3.9)
IgA: 234 mg/dL (ref 64–422)
IgG (Immunoglobin G), Serum: 801 mg/dL (ref 586–1602)
IgM (Immunoglobulin M), Srm: 47 mg/dL (ref 26–217)
Total Protein ELP: 6.2 g/dL (ref 6.0–8.5)

## 2019-03-01 LAB — IRON AND TIBC
Iron: 22 ug/dL — ABNORMAL LOW (ref 41–142)
Saturation Ratios: 5 % — ABNORMAL LOW (ref 21–57)
TIBC: 489 ug/dL — ABNORMAL HIGH (ref 236–444)
UIBC: 467 ug/dL — ABNORMAL HIGH (ref 120–384)

## 2019-03-01 LAB — FERRITIN: Ferritin: 16 ng/mL (ref 11–307)

## 2019-03-03 ENCOUNTER — Other Ambulatory Visit: Payer: Self-pay | Admitting: Oncology

## 2019-03-03 ENCOUNTER — Telehealth: Payer: Self-pay

## 2019-03-03 MED ORDER — FERROUS SULFATE 325 (65 FE) MG PO TBEC: 325.0000 mg | DELAYED_RELEASE_TABLET | Freq: Two times a day (BID) | ORAL | 3 refills | Status: DC

## 2019-03-03 NOTE — Telephone Encounter (Signed)
Called and spoke to Charlene Wade and gave her Dr. Hazeline Junker instructions below. Verbalized understanding.

## 2019-03-03 NOTE — Telephone Encounter (Signed)
-----   Message from Charlene Portela, MD sent at 03/03/2019 10:07 AM EDT ----- Please let her know her Iron level is low and need iron supplements. Oral iron pills rx sent to her pharmacy to take twice a day. I tried to call her and her caregiver Garnett Farm without success. Thanks.

## 2019-03-24 ENCOUNTER — Ambulatory Visit (INDEPENDENT_AMBULATORY_CARE_PROVIDER_SITE_OTHER): Payer: Medicare Other | Admitting: Family Medicine

## 2019-03-24 ENCOUNTER — Encounter: Payer: Self-pay | Admitting: Family Medicine

## 2019-03-24 ENCOUNTER — Other Ambulatory Visit: Payer: Self-pay

## 2019-03-24 DIAGNOSIS — K219 Gastro-esophageal reflux disease without esophagitis: Secondary | ICD-10-CM | POA: Diagnosis not present

## 2019-03-24 DIAGNOSIS — E7800 Pure hypercholesterolemia, unspecified: Secondary | ICD-10-CM | POA: Diagnosis not present

## 2019-03-24 DIAGNOSIS — I480 Paroxysmal atrial fibrillation: Secondary | ICD-10-CM | POA: Diagnosis not present

## 2019-03-24 DIAGNOSIS — R600 Localized edema: Secondary | ICD-10-CM

## 2019-03-24 DIAGNOSIS — I10 Essential (primary) hypertension: Secondary | ICD-10-CM

## 2019-03-24 DIAGNOSIS — M159 Polyosteoarthritis, unspecified: Secondary | ICD-10-CM

## 2019-03-24 MED ORDER — PANTOPRAZOLE SODIUM 20 MG PO TBEC: 20.0000 mg | DELAYED_RELEASE_TABLET | Freq: Every day | ORAL | 1 refills | Status: DC

## 2019-03-24 MED ORDER — DILTIAZEM HCL ER COATED BEADS 240 MG PO CP24: ORAL_CAPSULE | ORAL | 5 refills | Status: DC

## 2019-03-24 MED ORDER — DULOXETINE HCL 20 MG PO CPEP: 20.0000 mg | ORAL_CAPSULE | Freq: Every day | ORAL | 1 refills | Status: DC

## 2019-03-24 MED ORDER — LOSARTAN POTASSIUM 50 MG PO TABS: 50.0000 mg | ORAL_TABLET | Freq: Every day | ORAL | 1 refills | Status: DC

## 2019-03-24 NOTE — Progress Notes (Signed)
Virtual Visit via Telephone Note  I connected with [name] on 03/25/19 at  2:30 PM EDT by telephone and verified that I am speaking with the correct person using two identifiers.   I discussed the limitations, risks, security and privacy concerns of performing an evaluation and management service by telephone and the availability of in person appointments. I also discussed with the patient that there may be a patient responsible charge related to this service. The patient expressed understanding and agreed to proceed.  Location patient: home Location provider: work office Participants present for the call: patient, provider Patient did not have a visit in the prior 7 days to address this/these issue(s).   History of Present Illness: Talked with Cory on 5/1 and had been treated for cellulitis bilat LE. Discussed with HK as well at last visit in March and dx with venous stasis dermatitis, LE edema. Avoided lasix due to urinary frequency with med. She states that legs are doing better. Not sure that antibiotic really helped much. Taking furosemide to help with swelling. Tries to take it when she stays home, but not if going out. Does elevate legs in recliner; not usually at table. Not quite above heart level. Redness seems to come and go. Not necessarily noted that it improves with elevation. Legs are not usually hot or painful; usually don't bother her. She is wearing stockings now which seems to help. She wears them if she has someone there to help her.   Got up this morning feeling like she had a hole in stomach. Threw up water after breakfast this morning. Still has that feeling, somewhat nauseous. Doesn't have this regularly. Only thing different was some steak last night. Typically eats more frozen dinners. Not having much pain, burning. Just a little gnawing in stomach. Does have a lot of mucous at times and has been to several doctors for this -clear nasal drainage. Has not had anything  to eat since she vomited. Didn't throw up cereal. Hasn't had any loose stools. Stays constipated, but prunes keep things moving through.   With age; feels that things are getting harder all the time. Hard to be on feet in kitchen and hard to stand on feet, even with walker. Has bursitis in hip; arthritis "bone on bone" in knees (but states that knees don't bother her as much anymore). Doesn't take anything for knees now. Has very bad back. Bursitis keeps her from getting to comfortable position - did get improvement with injection, but has recurred. Has had compression fractures of spine as well.   HTN: not checking blood pressures at home.  A fib: cardizem, eliquis,coreg GERD: silent reflux. On daily protonix.  CKD stage 3: H/o CVA: sees neurology.  HL: on tricor Depression: "I'm not depressed". Likes to read.  Anemia: recently saw hematology (Dr. Alen Blew); he is following with plant to repeat bloodwork in 6 months.   Does see dermatology on q 6 mo to yearly basis. Gets moles looked at.   Observations/Objective: Patient sounds cheerful and well on the phone. I do not appreciate any SOB. Speech and thought processing are grossly intact. Patient reported vitals:  Assessment and Plan: 1. Essential hypertension Has been stable. - losartan (COZAAR) 50 MG tablet; Take 1 tablet (50 mg total) by mouth daily.  Dispense: 90 tablet; Refill: 1 - diltiazem (CARTIA XT) 240 MG 24 hr capsule; TAKE 1 CAPSULE(240 MG) BY MOUTH DAILY  Dispense: 30 capsule; Refill: 5  2. Paroxysmal atrial fibrillation ([place]) Follows with  cardiology. Has been rate controlled. On eliquis.   3. Osteoarthritis of multiple joints, unspecified osteoarthritis type Stable; not limiting activities.  4. Pure hypercholesterolemia On tricor  5. Leg edema Has been stable; improves with lasix. Keeping elevated when not standing.  6. Gastroesophageal reflux disease, esophagitis presence not specified - pantoprazole (PROTONIX) 20  MG tablet; Take 1 tablet (20 mg total) by mouth daily.  Dispense: 90 tablet; Refill: 1   Follow Up Instructions: Due to nausea and single episode vomiting today; recommended second protonix this evening. Mild/blander foods. We will check in with her tomorrow and see how she does overnight. Let us know if any worsening of symptoms sooner. Follow up pending this response.    [id number] [id number] I did not refer this patient for an OV in the next 24 hours for this/these issue(s).  I discussed the assessment and treatment plan with the patient. The patient was provided an opportunity to ask questions and all were answered. The patient agreed with the plan and demonstrated an understanding of the instructions.   The patient was advised to call back or seek an in-person evaluation if the symptoms worsen or if the condition fails to improve as anticipated.  I provided 22 minutes of non-face-to-face time during this encounter.   Micheline Rough, MD

## 2019-03-25 ENCOUNTER — Telehealth: Payer: Self-pay | Admitting: *Deleted

## 2019-03-25 NOTE — Telephone Encounter (Signed)
I left a message for the pt to return my call.  CRM also created. 

## 2019-03-25 NOTE — Telephone Encounter (Signed)
-----   Message from Caren Macadam, MD sent at 03/24/2019  3:17 PM EDT ----- Can you check in with her tomorrow (Thursday) and see how stomach is doing? Any more vomiting? (better to call in afternoon after lunch)

## 2019-04-01 NOTE — Telephone Encounter (Signed)
I left a message for the pt to return my call. 

## 2019-04-16 ENCOUNTER — Ambulatory Visit: Payer: Self-pay

## 2019-04-16 NOTE — Telephone Encounter (Signed)
Patient called in and says both of her legs are swollen and red below the knees. She says the swelling is always there and when they get red, she has to take antibiotics. She denies fever. She says the left leg is larger than on the right and pain is in the left leg at a 6-7. She denies any other symptoms and no fever. I advised a virtual visit, appointment scheduled for tomorrow, 04/17/19 at 1000 with Dr. Deborra Medina, care advice given, she verbalized understanding.  Reason for Disposition . [1] MODERATE leg swelling (e.g., swelling extends up to knees) AND [2] new onset or worsening  Answer Assessment - Initial Assessment Questions 1. ONSET: "When did the swelling start?" (e.g., minutes, hours, days)     Stays most of the time 2. LOCATION: "What part of the leg is swollen?"  "Are both legs swollen or just one leg?"     Both legs below the knees, left larger than right and  3. SEVERITY: "How bad is the swelling?" (e.g., localized; mild, moderate, severe)  - Localized - small area of swelling localized to one leg  - MILD pedal edema - swelling limited to foot and ankle, pitting edema < 1/4 inch (6 mm) deep, rest and elevation eliminate most or all swelling  - MODERATE edema - swelling of lower leg to knee, pitting edema > 1/4 inch (6 mm) deep, rest and elevation only partially reduce swelling  - SEVERE edema - swelling extends above knee, facial or hand swelling present      Moderate 4. REDNESS: "Does the swelling look red or infected?"     Yes, both legs 5. PAIN: "Is the swelling painful to touch?" If so, ask: "How painful is it?"   (Scale 1-10; mild, moderate or severe)     Yes, left leg 6-7 6. FEVER: "Do you have a fever?" If so, ask: "What is it, how was it measured, and when did it start?"      No 7. CAUSE: "What do you think is causing the leg swelling?"     Chronic leg swelling 8. MEDICAL HISTORY: "Do you have a history of heart failure, kidney disease, liver failure, or cancer?"     No 9.  RECURRENT SYMPTOM: "Have you had leg swelling before?" If so, ask: "When was the last time?" "What happened that time?"     Yes, swollen all the time, take antibiotics when get red 10. OTHER SYMPTOMS: "Do you have any other symptoms?" (e.g., chest pain, difficulty breathing)       No 11. PREGNANCY: "Is there any chance you are pregnant?" "When was your last menstrual period?"       N/A  Protocols used: LEG SWELLING AND EDEMA-A-AH

## 2019-04-17 ENCOUNTER — Ambulatory Visit: Payer: Medicare Other | Admitting: Family Medicine

## 2019-04-17 ENCOUNTER — Telehealth: Payer: Self-pay | Admitting: Family Medicine

## 2019-04-17 NOTE — Telephone Encounter (Signed)
LMOV for patient to make aware this visit for Saturday have to be a video visit. Pt called back and stating she does not have a smart phone or computer and no one with her have smart phone. Patient advise she can go to urgent care or to call PCP on Monday for appointment. Pt agreed will call on Monday to PCP.

## 2019-04-19 NOTE — Telephone Encounter (Signed)
Spoke with Charlene Wade, caregiver, she reports pt's LE's are swollen, red and warm to touch, left worse than right, she denies any weeping or other discolorations.   Pt was scheduled for visit on Saturday but it was cancelled by the provider - per caregiver. They are able to complete virtual visit. Advised her Dr. Maudie Mercury is not in the office today. She reports pt should be able to wait until Tuesday, offered visit today with another provider but she declined.   Dr. Maudie Mercury - Pt scheduled for virtual visit Tuesday. Caregiver states they have tried virtual before and had connection issues, fyi. Thanks!

## 2019-04-20 ENCOUNTER — Encounter: Payer: Self-pay | Admitting: Family Medicine

## 2019-04-20 ENCOUNTER — Ambulatory Visit (INDEPENDENT_AMBULATORY_CARE_PROVIDER_SITE_OTHER): Payer: Medicare Other | Admitting: Family Medicine

## 2019-04-20 ENCOUNTER — Other Ambulatory Visit: Payer: Self-pay

## 2019-04-20 DIAGNOSIS — R600 Localized edema: Secondary | ICD-10-CM | POA: Diagnosis not present

## 2019-04-20 DIAGNOSIS — L03116 Cellulitis of left lower limb: Secondary | ICD-10-CM | POA: Diagnosis not present

## 2019-04-20 MED ORDER — CEPHALEXIN 500 MG PO CAPS: 500.0000 mg | ORAL_CAPSULE | Freq: Two times a day (BID) | ORAL | 0 refills | Status: DC

## 2019-04-20 NOTE — Progress Notes (Signed)
Virtual Visit via Video Note  I connected with Charlene Wade  on 04/20/19 at 12:00 PM EDT by a video enabled telemedicine application and verified that I am speaking with the correct person using two identifiers.  Location patient: home Location provider:work or home office Persons participating in the virtual visit: patient, provider  I discussed the limitations of evaluation and management by telemedicine and the availability of in person appointments. The patient expressed understanding and agreed to proceed.   HPI:  Acute visit for erythema/swelling of the legs: -chronic lower extremity edema -she is non-compliant with lasix sometime - doesn't like that it increases urination -reports some increased redness and pain in the legs the last week or so -hx of recurrent cellulitis, usually clears with oral abx, she feels the doxy she was given the last two times did not work as well as prior treatmet -denies fevers, malaise, drainage, wounds  ROS: See pertinent positives and negatives per HPI.  Past Medical History:  Diagnosis Date  . Allergic rhinitis   . Atrial fibrillation ([place]) 09/26/2017   New-onset A. Fib now persistent  . BCC (basal cell carcinoma of skin) 04/10/2015   sees dermatologist  . CKD (chronic kidney disease) stage 3, GFR 30-59 ml/min ([place]) 04/26/2015  . Depression 10/17/2012  . Ectopic pregnancy   . GERD (gastroesophageal reflux disease)    hx hiatal hernia, has seen GI, ENT and allergist in the past  . Hyperlipidemia   . Hypertension   . OA (osteoarthritis)    knees and back, hx DDD, s/p remote lumbar disc surgery  . OAB (overactive bladder) 04/10/2015  . Obesity   . Osteoporosis    on prolia in the past  . Stroke [facility])    sees neurologist, hx memory loss, expressive aphasia  . Venous insufficiency     Past Surgical History:  Procedure Laterality Date  . ABDOMINAL HYSTERECTOMY    . APPENDECTOMY    . BREAST REDUCTION SURGERY    . LAPAROSCOPIC  SALPINGOOPHERECTOMY    . NASAL MASS EXCISION     Basal Cell Cancer Excision  . NASAL RECONSTRUCTION    . TEE WITHOUT CARDIOVERSION  10/22/2012   Procedure: TRANSESOPHAGEAL ECHOCARDIOGRAM (TEE);  Surgeon: Josue Hector, MD;  Location: [facility] ENDOSCOPY;  Service: Cardiovascular;  Laterality: N/A;    Family History  Problem Relation Age of Onset  . Hypertension Father   . Hypertension Mother   . Cancer - Lung Brother     SOCIAL HX:see hpi   Current Outpatient Medications:  .  acetaminophen (TYLENOL) 500 MG tablet, Take 500 mg by mouth 2 (two) times daily as needed for headache (pain). , Disp: , Rfl:  .  albuterol (PROVENTIL HFA;VENTOLIN HFA) 108 (90 Base) MCG/ACT inhaler, Inhale 2 puffs into the lungs every 6 (six) hours as needed., Disp: 1 Inhaler, Rfl: 0 .  apixaban (ELIQUIS) 5 MG TABS tablet, Take 1 tablet (5 mg total) by mouth 2 (two) times daily., Disp: 60 tablet, Rfl: 5 .  benzonatate (TESSALON) 100 MG capsule, TAKE 1 CAPSULE(100 MG) BY MOUTH THREE TIMES DAILY AS NEEDED, Disp: 30 capsule, Rfl: 0 .  calcitonin, salmon, (MIACALCIN/FORTICAL) 200 UNIT/ACT nasal spray, , Disp: , Rfl: 0 .  carvedilol (COREG) 3.125 MG tablet, TAKE 1 TABLET(3.125 MG) BY MOUTH TWICE DAILY WITH A MEAL, Disp: 60 tablet, Rfl: 5 .  cetirizine (ZYRTEC) 10 MG tablet, Take 10 mg by mouth at bedtime. , Disp: , Rfl:  .  cholecalciferol (VITAMIN D) 1000 units tablet, Take 2,000  Units by mouth 2 (two) times daily., Disp: , Rfl:  .  Cyanocobalamin (VITAMIN B12 PO), Take 1 tablet by mouth daily. , Disp: , Rfl:  .  denosumab (PROLIA) 60 MG/ML SOLN injection, Inject 60 mg into the skin every 6 (six) months. Administer in upper arm, thigh, or abdomen (last injection June or July 2018), Disp: , Rfl:  .  diclofenac sodium (VOLTAREN) 1 % GEL, Apply 1 application topically 4 (four) times daily as needed (arm pain/bursitis)., Disp: , Rfl:  .  diltiazem (CARTIA XT) 240 MG 24 hr capsule, TAKE 1 CAPSULE(240 MG) BY MOUTH DAILY, Disp: 30  capsule, Rfl: 5 .  DULoxetine (CYMBALTA) 20 MG capsule, Take 1 capsule (20 mg total) by mouth daily., Disp: 90 capsule, Rfl: 1 .  fenofibrate (TRICOR) 145 MG tablet, TAKE 1 TABLET(145 MG) BY MOUTH DAILY, Disp: 90 tablet, Rfl: 1 .  ferrous sulfate 325 (65 FE) MG EC tablet, Take 1 tablet (325 mg total) by mouth 2 (two) times daily before a meal., Disp: 60 tablet, Rfl: 3 .  fluticasone (FLONASE) 50 MCG/ACT nasal spray, SHAKE LIQUID AND USE 1 SPRAY IN EACH NOSTRIL TWICE DAILY, Disp: 48 g, Rfl: 4 .  furosemide (LASIX) 20 MG tablet, Take 1 tablet (20 mg total) by mouth daily. As needed for swelling in the legs., Disp: 30 tablet, Rfl: 3 .  losartan (COZAAR) 50 MG tablet, Take 1 tablet (50 mg total) by mouth daily., Disp: 90 tablet, Rfl: 1 .  Multiple Vitamins-Minerals (PRESERVISION AREDS PO), Take 1 tablet by mouth 3 (three) times daily. , Disp: , Rfl:  .  oxybutynin (DITROPAN-XL) 5 MG 24 hr tablet, TAKE 1 TABLET BY MOUTH ONCE DAILY, Disp: 90 tablet, Rfl: 1 .  pantoprazole (PROTONIX) 20 MG tablet, Take 1 tablet (20 mg total) by mouth daily., Disp: 90 tablet, Rfl: 1 .  Polyethyl Glycol-Propyl Glycol (SYSTANE OP), Place 1 drop into both eyes 2 (two) times daily as needed (dry eyes)., Disp: , Rfl:  .  traMADol (ULTRAM) 50 MG tablet, Take 1 tablet (50 mg total) by mouth every 6 (six) hours as needed., Disp: 30 tablet, Rfl: 0 .  cephALEXin (KEFLEX) 500 MG capsule, Take 1 capsule (500 mg total) by mouth 2 (two) times daily., Disp: 10 capsule, Rfl: 0 .  fluticasone (FLOVENT HFA) 44 MCG/ACT inhaler, Inhale 2 puffs into the lungs 2 (two) times daily for 14 days., Disp: 1 Inhaler, Rfl: 0  EXAM:  VITALS per patient if applicable:denies fever  GENERAL: alert, oriented, appears well and in no acute distress  HEENT: atraumatic, conjunttiva clear, no obvious abnormalities on inspection of external nose and ears  NECK: normal movements of the head and neck  LUNGS: on inspection no signs of respiratory distress,  breathing rate appears normal, no obvious gross SOB, gasping or wheezing  CV: no obvious cyanosis  SKIN: mild-mod edema of bilat lower extremities to the upper calf, fairly demarcated irr patch of erythema on the L ant leg, otherwise mild erythema of skin  MS: moves all visible extremities without noticeable abnormality  PSYCH/NEURO: pleasant and cooperative, no obvious depression or anxiety, speech and thought processing grossly intact  ASSESSMENT AND PLAN:  Discussed the following assessment and plan:  Leg edema -  Cellulitis of left lower extremity -  Discussed potential etiologies, options for evaluation, treatment, risks and precautions. She has long standing venous stasis dermatitis. Have advised elevation, compression and diuresis, but she has a hard time with compliance. Re-iterated theses measures again today.Possible cellulitis  L LE. Opted to treat with keflex 500mg  bid x 5 days. This has seemed to work well in the past. She is aware of potential cross reaction with penicillin allergy, however reports penicillin allergy was mild. Advised to follow up if worsening, new symptoms or if not resolved over the next 5 days.   I discussed the assessment and treatment plan with the patient. The patient was provided an opportunity to ask questions and all were answered. The patient agreed with the plan and demonstrated an understanding of the instructions.   The patient was advised to call back or seek an in-person evaluation if the symptoms worsen or if the condition fails to improve as anticipated.   Follow up instructions: Advised assistant Wendie Simmer to help patient arrange the following: -follow up with Dr. Maudie Mercury in 2-3 months  Lucretia Kern, DO

## 2019-04-21 ENCOUNTER — Telehealth: Payer: Self-pay | Admitting: *Deleted

## 2019-04-21 NOTE — Telephone Encounter (Signed)
-----   Message from Lucretia Kern, DO sent at 04/20/2019 12:21 PM EDT ----- -follow up with Dr. Maudie Mercury in 2-3 months

## 2019-04-21 NOTE — Telephone Encounter (Signed)
I called the pts caregiver Garnett Farm and she stated she will call back for the follow up visit.

## 2019-05-02 ENCOUNTER — Other Ambulatory Visit: Payer: Self-pay | Admitting: Family Medicine

## 2019-05-08 ENCOUNTER — Other Ambulatory Visit: Payer: Self-pay | Admitting: Family Medicine

## 2019-05-08 DIAGNOSIS — K219 Gastro-esophageal reflux disease without esophagitis: Secondary | ICD-10-CM

## 2019-05-12 ENCOUNTER — Other Ambulatory Visit: Payer: Self-pay

## 2019-05-12 ENCOUNTER — Encounter (INDEPENDENT_AMBULATORY_CARE_PROVIDER_SITE_OTHER): Payer: Medicare Other | Admitting: Ophthalmology

## 2019-05-12 DIAGNOSIS — I10 Essential (primary) hypertension: Secondary | ICD-10-CM

## 2019-05-12 DIAGNOSIS — H348312 Tributary (branch) retinal vein occlusion, right eye, stable: Secondary | ICD-10-CM

## 2019-05-12 DIAGNOSIS — H353122 Nonexudative age-related macular degeneration, left eye, intermediate dry stage: Secondary | ICD-10-CM

## 2019-05-12 DIAGNOSIS — H43813 Vitreous degeneration, bilateral: Secondary | ICD-10-CM | POA: Diagnosis not present

## 2019-05-12 DIAGNOSIS — H35033 Hypertensive retinopathy, bilateral: Secondary | ICD-10-CM | POA: Diagnosis not present

## 2019-05-23 ENCOUNTER — Other Ambulatory Visit: Payer: Self-pay | Admitting: Family Medicine

## 2019-05-30 ENCOUNTER — Other Ambulatory Visit: Payer: Self-pay | Admitting: Family Medicine

## 2019-06-15 ENCOUNTER — Telehealth: Payer: Self-pay

## 2019-06-15 ENCOUNTER — Other Ambulatory Visit: Payer: Self-pay | Admitting: Family Medicine

## 2019-06-15 NOTE — Telephone Encounter (Signed)
Copied from [place] [id number]. Topic: Referral - Request for Referral >> Jun 15, 2019  4:37 PM Reyne Dumas L wrote: Has patient seen PCP for this complaint? no *If NO, is insurance requiring patient see PCP for this issue before PCP can refer them? Referral for which specialty: podiatrist Preferred provider/office: no preference Reason for referral: corn or wart on right foot/pinky toe  Cher, caregiver, calling to see if doctor could provide recommendation or referral to podiatry.

## 2019-06-16 NOTE — Telephone Encounter (Signed)
Spoke with patient care giver. Patient would like for you to take care of this. Shall I place her in a 30 min procedure slot?

## 2019-06-16 NOTE — Telephone Encounter (Signed)
Patient scheduled  for 8/26 at 3:15

## 2019-06-16 NOTE — Telephone Encounter (Signed)
Ok for referral; I am also happy to take care of this for her if desired.

## 2019-06-16 NOTE — Telephone Encounter (Signed)
15 minutes is ok.

## 2019-06-21 ENCOUNTER — Other Ambulatory Visit: Payer: Self-pay | Admitting: Family Medicine

## 2019-06-21 DIAGNOSIS — H353213 Exudative age-related macular degeneration, right eye, with inactive scar: Secondary | ICD-10-CM | POA: Diagnosis not present

## 2019-06-21 DIAGNOSIS — H04123 Dry eye syndrome of bilateral lacrimal glands: Secondary | ICD-10-CM | POA: Diagnosis not present

## 2019-06-21 DIAGNOSIS — H353123 Nonexudative age-related macular degeneration, left eye, advanced atrophic without subfoveal involvement: Secondary | ICD-10-CM | POA: Diagnosis not present

## 2019-06-21 DIAGNOSIS — Z961 Presence of intraocular lens: Secondary | ICD-10-CM | POA: Diagnosis not present

## 2019-06-23 ENCOUNTER — Other Ambulatory Visit: Payer: Self-pay

## 2019-06-23 ENCOUNTER — Encounter: Payer: Self-pay | Admitting: Family Medicine

## 2019-06-23 ENCOUNTER — Ambulatory Visit (INDEPENDENT_AMBULATORY_CARE_PROVIDER_SITE_OTHER): Payer: Medicare Other | Admitting: Family Medicine

## 2019-06-23 VITALS — BP 112/60 | HR 93 | Temp 98.3°F | Ht <= 58 in | Wt 146.8 lb

## 2019-06-23 DIAGNOSIS — M533 Sacrococcygeal disorders, not elsewhere classified: Secondary | ICD-10-CM

## 2019-06-23 DIAGNOSIS — R600 Localized edema: Secondary | ICD-10-CM

## 2019-06-23 DIAGNOSIS — L84 Corns and callosities: Secondary | ICD-10-CM | POA: Diagnosis not present

## 2019-06-23 DIAGNOSIS — M7062 Trochanteric bursitis, left hip: Secondary | ICD-10-CM

## 2019-06-23 MED ORDER — DOXYCYCLINE HYCLATE 100 MG PO TABS: 100.0000 mg | ORAL_TABLET | Freq: Two times a day (BID) | ORAL | 0 refills | Status: DC

## 2019-06-23 NOTE — Patient Instructions (Addendum)
Keep legs elevated in chair. If more tender, red, painful then start antibiotic (I have already sent to pharmacy for you)  Lasix daily. Try not to skip medications.   Consider voltaren gel to hip for pain.

## 2019-06-23 NOTE — Progress Notes (Signed)
Charlene Wade DOB: 09/01/1928 Encounter date: 06/23/2019  This is a 85 y.o. female who presents with Chief Complaint  Patient presents with  . Rash    patient complains of painful corn or callous right 5th toe, states sister filed the area 2 weeks ago    History of present illness: Looked like regular corn on right 5th toe - painful little spot. Sometimes looks like nothing there. Has shaved it twice by sister - rubbed with nail file which did help a little but then a day later started to hurt it. Bothers her to rub against shoe.   Thinks she has some bursitis or something similar - towards middle of bottom. Sore to sit on. Has been there for a year.   Legs are painful, swollen. Not really better in morning. Has been that way for at least a week. Is still taking lasix. Didn't take today because she had to come here. Does have a lot of urine output with it. Cher with patient at appointment today and states that she sits all day at table with legs hanging down. No elevation. Unable to wear stockings due to needing assistance to put on/take off and timing of patient waking up/caregiver being out of house. Per patient she takes lasix nearly daily although Paulino Rily states she takes only a few days/week. cher states that previously when legs edematous like this Loreta responds well to antibiotics. Did not feel that she did as well with keflex that was given recently.  Has ortho and really likes her providers. Has not had follow up for some time, but feels that visits have been helpful with joint pains in past.    Allergies  Allergen Reactions  . Levaquin [Levofloxacin] Other (See Comments)    Severe intermittent arm pain  . Ace Inhibitors Cough  . Codeine Other (See Comments)    Unknown reaction - didn't feel well?  . Penicillins Other (See Comments)    Caused high fever Has patient had a PCN reaction causing immediate rash, facial/tongue/throat swelling, SOB or lightheadedness with  hypotension: No Has patient had a PCN reaction causing severe rash involving mucus membranes or skin necrosis: No Has patient had a PCN reaction that required hospitalization: No Has patient had a PCN reaction occurring within the last 10 years: No If all of the above answers are "NO", then may proceed with Cephalosporin use.  . Sulfa Antibiotics Other (See Comments)    Possibly caused fever  . Sulfasalazine Other (See Comments)    Possibly caused fever   Current Meds  Medication Sig  . acetaminophen (TYLENOL) 500 MG tablet Take 500 mg by mouth 2 (two) times daily as needed for headache (pain).   Marland Kitchen albuterol (PROVENTIL HFA;VENTOLIN HFA) 108 (90 Base) MCG/ACT inhaler Inhale 2 puffs into the lungs every 6 (six) hours as needed.  . benzonatate (TESSALON) 100 MG capsule TAKE 1 CAPSULE(100 MG) BY MOUTH THREE TIMES DAILY AS NEEDED  . calcitonin, salmon, (MIACALCIN/FORTICAL) 200 UNIT/ACT nasal spray   . carvedilol (COREG) 3.125 MG tablet TAKE 1 TABLET(3.125 MG) BY MOUTH TWICE DAILY WITH A MEAL  . cetirizine (ZYRTEC) 10 MG tablet Take 10 mg by mouth at bedtime.   . cholecalciferol (VITAMIN D) 1000 units tablet Take 2,000 Units by mouth 2 (two) times daily.  . Cyanocobalamin (VITAMIN B12 PO) Take 1 tablet by mouth daily.   Marland Kitchen denosumab (PROLIA) 60 MG/ML SOLN injection Inject 60 mg into the skin every 6 (six) months. Administer in upper arm, thigh,  or abdomen (last injection June or July 2018)  . diclofenac sodium (VOLTAREN) 1 % GEL Apply 1 application topically 4 (four) times daily as needed (arm pain/bursitis).  Marland Kitchen diltiazem (CARTIA XT) 240 MG 24 hr capsule TAKE 1 CAPSULE(240 MG) BY MOUTH DAILY  . DULoxetine (CYMBALTA) 20 MG capsule TAKE 1 CAPSULE BY MOUTH EVERY DAY  . ELIQUIS 5 MG TABS tablet TAKE 1 TABLET(5 MG) BY MOUTH TWICE DAILY  . fenofibrate (TRICOR) 145 MG tablet TAKE 1 TABLET(145 MG) BY MOUTH DAILY  . fluticasone (FLONASE) 50 MCG/ACT nasal spray INSTILL 1 SPRAY INTO EACH NOSTRIL TWICE DAILY   . furosemide (LASIX) 20 MG tablet TAKE 1 TABLET(20 MG) BY MOUTH LEGS DAILY AS NEEDED FOR SWELLING IN THE LEGS  . losartan (COZAAR) 50 MG tablet Take 1 tablet (50 mg total) by mouth daily.  . Multiple Vitamins-Minerals (PRESERVISION AREDS PO) Take 1 tablet by mouth 3 (three) times daily.   Marland Kitchen oxybutynin (DITROPAN-XL) 5 MG 24 hr tablet TAKE 1 TABLET BY MOUTH ONCE DAILY  . pantoprazole (PROTONIX) 20 MG tablet TAKE 1 TABLET BY MOUTH EVERY DAY  . Polyethyl Glycol-Propyl Glycol (SYSTANE OP) Place 1 drop into both eyes 2 (two) times daily as needed (dry eyes).  . traMADol (ULTRAM) 50 MG tablet Take 1 tablet (50 mg total) by mouth every 6 (six) hours as needed.  . [DISCONTINUED] cephALEXin (KEFLEX) 500 MG capsule Take 1 capsule (500 mg total) by mouth 2 (two) times daily.  . [DISCONTINUED] ferrous sulfate 325 (65 FE) MG EC tablet Take 1 tablet (325 mg total) by mouth 2 (two) times daily before a meal.    Review of Systems  Constitutional: Negative for chills, fatigue and fever.  Respiratory: Negative for cough, chest tightness, shortness of breath and wheezing.   Cardiovascular: Positive for leg swelling. Negative for chest pain and palpitations.  Musculoskeletal: Positive for arthralgias and back pain (lower).  Skin: Positive for color change.    Objective:  BP 112/60 (BP Location: Left Arm, Patient Position: Sitting, Cuff Size: Large)   Pulse 93   Temp 98.3 F (36.8 C) (Temporal)   Ht 4\' 8"  (1.422 m)   Wt 146 lb 12.8 oz (66.6 kg)   SpO2 96%   BMI 32.91 kg/m   Weight: 146 lb 12.8 oz (66.6 kg)   BP Readings from Last 3 Encounters:  06/23/19 112/60  02/26/19 (!) 146/63  01/06/19 120/78   Wt Readings from Last 3 Encounters:  06/23/19 146 lb 12.8 oz (66.6 kg)  02/26/19 155 lb 9.6 oz (70.6 kg)  07/15/18 161 lb 3.2 oz (73.1 kg)    Physical Exam Constitutional:      General: She is not in acute distress.    Appearance: She is well-developed.  Cardiovascular:     Rate and Rhythm:  Normal rate and regular rhythm.     Pulses:          Dorsalis pedis pulses are 2+ on the right side and 2+ on the left side.       Posterior tibial pulses are 2+ on the right side and 2+ on the left side.     Heart sounds: Normal heart sounds. No murmur. No friction rub.  Pulmonary:     Effort: Pulmonary effort is normal. No respiratory distress.     Breath sounds: Normal breath sounds. No wheezing or rales.  Musculoskeletal:     Right lower leg: 2+ Pitting Edema present.     Left lower leg: 2+ Pitting Edema present.  Comments: No pain with internal/external hip rotation on left. There is SI tenderness to palpation. There is left trochanteric tenderness to palpation left.   Skin:    Comments: No significant warmth appreciated to lower extremities. They are tender to deeper palpation. Skin is tight and appears to be near blistering due to fluid. Skin is lightly pink but color does improve with elevation.   Right foot - there is tenderness with palpation 5th lateral toe - there is minimal callous and some bony prominence. When cleansed and callous shaved with dermal curette; patient stated that pain resolved. Tolerated procedure well. Callous was all superficial and easily removed.  However, when she put shoe back on and there was constant pressure along lateral aspect of toe, she again had pain.   Neurological:     Mental Status: She is alert and oriented to person, place, and time.  Psychiatric:        Behavior: Behavior normal.     Assessment/Plan  1. Edema of both legs I discussed with them that I do not feel there is cellulitis present currently. I did send antibiotic in case of worsening symptoms however. I reviewed ways to help with edema control. Discussion difficult since Cher repeatedly states during visit that patient will not be compliant with recommendations and unfortunately, as we reviewed, there are not a large variety of tools to choose from in order to treat edema. We  discussed: *elevation of legs above heart level when sitting/lying down. Try to keep elevated when not walking *try to arrange time so that compression stockings can be worn. This will help your body put fluid back into circulation so that you can get rid of it.  *take the lasix daily.  *avoid salty foods (especially some of processed/fast food that are favorites for patient)  If not improving with above let me know. Reach out with any worsening.  - doxycycline (VIBRA-TABS) 100 MG tablet; Take 1 tablet (100 mg total) by mouth 2 (two) times daily for 5 days.  Dispense: 10 tablet; Refill: 0  2. Trochanteric bursitis of left hip Discussed ways to address at home. Suggested some stretches of hip/SI area that she can do in chair - cross leg stretch for IT band, figure four stretch. Not interested in PT. Suggested icing for trochanteric bursa. Cher states patient won't do stretches or icing at home. Suggested returning to ortho for evaluation. She likes her providers through ortho group and while I would consider doing trochanteric injection, I feel she would benefit from SI injection as well if they feel indicated.  - Ambulatory referral to Orthopedics  3. Pain of left sacroiliac joint See above.  - Ambulatory referral to Orthopedics  4. Corn of toe Initially had some pain relief with shaving, but shoe is putting direct pressure on 5th digit. I do not feel that there is residual corn but more tenderness within bone of 5th digit. I suggested larger toe box with shoes so that shoe is not rubbing on foot. [place] for podiatry referral if interested, but I think that wider toe box alone will solve problem since pain is only occurring with wearing shoes.    Return if symptoms worsen or fail to improve.    Micheline Rough, MD

## 2019-06-25 ENCOUNTER — Other Ambulatory Visit: Payer: Self-pay | Admitting: Oncology

## 2019-06-28 ENCOUNTER — Other Ambulatory Visit: Payer: Self-pay

## 2019-06-28 ENCOUNTER — Ambulatory Visit (INDEPENDENT_AMBULATORY_CARE_PROVIDER_SITE_OTHER): Payer: Medicare Other | Admitting: Internal Medicine

## 2019-06-28 ENCOUNTER — Encounter: Payer: Self-pay | Admitting: Internal Medicine

## 2019-06-28 VITALS — BP 128/70 | HR 86 | Ht <= 58 in | Wt 143.0 lb

## 2019-06-28 DIAGNOSIS — M810 Age-related osteoporosis without current pathological fracture: Secondary | ICD-10-CM

## 2019-06-28 DIAGNOSIS — E559 Vitamin D deficiency, unspecified: Secondary | ICD-10-CM

## 2019-06-28 NOTE — Progress Notes (Addendum)
Patient ID: Charlene Wade, female   DOB: 1928-03-30, 85 y.o.   MRN: [id number]   HPI  Charlene Wade is a 85 y.o.-year-old female,  Osteoporosis and vitamin D deficiency.Last visit 1 year ago.  She is here with her caregiver who offers part of the history.  Still has L hip bursitis >> pain >> no more steroid inj's.  Reviewed and addended history: Pt was dx with OP in 2007.  She fell and had a R humerus fx (comminuted) in 11/2011.  Reviewed her DXA scan reports:  07/09/2017: Lumbar spine L2-L4 (L1) Femoral neck (FN)  T-score +0.9 RFN: -1.7 LFN: -1.2  Change in BMD from previous DXA test (%) +7.8%* -2.1%  (*) statistically significant   06/28/2015 () Lumbar spine (L1-L4) Femoral neck (FN)  T-score  +0.4  RFN: -1.3 LFN: -1.3    08/01/2011 - [facility] ([place]):  L1-L3 (L4) T score: 0.0 RFN T score: -1.6 LFN T score: -1.7 FRAX score: MOF 12.6%, hip fracture risk 3.2%  05/28/2006 [name] [place]):  L1-L4 T score: -1.0 LFN T score: -1.3 1/3 distal forearm T score: -2.7 VFA was performed and was read as abnormal, however no details given in the report and the image quality is too poor for me to analyze.  Treatments: - Fosamax - for "many years"  - developed a cyst ~2011 years ago in the bone of the wisdom tooth >> extraction >> infection >> was told she had a jaw fracture - Prolia - so far, she took it consistently since 2013 ([place] [facility]).  Received records from [place]: Prolia injections documented- no co-pay required:  She continues on Prolia: - 09/14/2014  - 03/03/2015  - 09/19/2015 - 03/19/2016  - 10/08/2016 - 04/16/2017 - 10/15/2017 ? - 01/25/2019  She has a history of vitamin D deficiency but latest levels have been normal Lab Results  Component Value Date   VD25OH 40.63 06/22/2018   VD25OH 31.94 06/09/2017   VD25OH 48.19 06/03/2016   VD25OH 34.96 10/02/2015   VD25OH  15.79 (L) 06/02/2015   She takes 4000 units vitamin D daily.    No weightbearing exercises.  No stairs at home. 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   She has a history of hypocalcemia.  She had a low parathyroid hormone in 2013.  No history of kidney stones Lab Results  Component Value Date   PTH 8.3 (L) 10/13/2012   CALCIUM 8.4 (L) 02/26/2019   CALCIUM 7.6 (L) 02/03/2019   CALCIUM 9.1 07/21/2018   CALCIUM 9.5 06/16/2018   CALCIUM 8.6 11/04/2017   CALCIUM 8.6 (L) 09/28/2017   CALCIUM 8.7 (L) 09/27/2017   CALCIUM 9.0 09/26/2017   CALCIUM 9.8 07/08/2017   CALCIUM 9.0 03/21/2017   No history of thyro toxicosis.  Latest TSH was normal: Lab Results  Component Value Date   TSH 0.438 09/27/2017   TSH 1.48 06/02/2015   + CKD. Last BUN/Cr: Lab Results  Component Value Date   BUN 21 02/26/2019   CREATININE 1.72 (H) 02/26/2019   Pt does have a FH of osteoporosis.  Her mother has osteoporosis and has had an arm fracture.  She also has a history of spinal stenosis - s/p surgery for ruptured disk.  She had spine injections in the past  ROS: Constitutional: no weight gain/+ weight loss, no fatigue, no subjective hyperthermia, no subjective hypothermia Eyes: no blurry vision, no xerophthalmia ENT: no sore throat,  no nodules palpated in neck, no dysphagia, no odynophagia, no hoarseness Cardiovascular: no CP/+ SOB/no palpitations/no leg swelling Respiratory: no cough/+ SOB/no wheezing Gastrointestinal: no N/no V/no D/no C/no acid reflux Musculoskeletal: no muscle aches/+ joint aches Skin: no rashes, no hair loss Neurological: no tremors/no numbness/no tingling/no dizziness  I reviewed pt's medications, allergies, PMH, social hx, family hx, and changes were documented in the history of present illness. Otherwise, unchanged from my initial visit note.  Past Medical History:  Diagnosis Date  . Allergic rhinitis   . Atrial fibrillation ([place]) 09/26/2017   New-onset A. Fib now  persistent  . BCC (basal cell carcinoma of skin) 04/10/2015   sees dermatologist  . CKD (chronic kidney disease) stage 3, GFR 30-59 ml/min ([place]) 04/26/2015  . Depression 10/17/2012  . Ectopic pregnancy   . GERD (gastroesophageal reflux disease)    hx hiatal hernia, has seen GI, ENT and allergist in the past  . Hyperlipidemia   . Hypertension   . OA (osteoarthritis)    knees and back, hx DDD, s/p remote lumbar disc surgery  . OAB (overactive bladder) 04/10/2015  . Obesity   . Osteoporosis    on prolia in the past  . Stroke [facility])    sees neurologist, hx memory loss, expressive aphasia  . Venous insufficiency    Past Surgical History:  Procedure Laterality Date  . ABDOMINAL HYSTERECTOMY    . APPENDECTOMY    . BREAST REDUCTION SURGERY    . LAPAROSCOPIC SALPINGOOPHERECTOMY    . NASAL MASS EXCISION     Basal Cell Cancer Excision  . NASAL RECONSTRUCTION    . TEE WITHOUT CARDIOVERSION  10/22/2012   Procedure: TRANSESOPHAGEAL ECHOCARDIOGRAM (TEE);  Surgeon: Josue Hector, MD;  Location: [facility] ENDOSCOPY;  Service: Cardiovascular;  Laterality: N/A;   Social History   Socioeconomic History  . Marital status: Widowed    Spouse name: Not on file  . Number of children: 2  . Years of education: 31  . Highest education level: Not on file  Occupational History  . Occupation: retired  Scientific laboratory technician  . Financial resource strain: Not on file  . Food insecurity    Worry: Not on file    Inability: Not on file  . Transportation needs    Medical: Not on file    Non-medical: Not on file  Tobacco Use  . Smoking status: Former Research scientist (life sciences)  . Smokeless tobacco: Never Used  Substance and Sexual Activity  . Alcohol use: No  . Drug use: No  . Sexual activity: Never  Lifestyle  . Physical activity    Days per week: Not on file    Minutes per session: Not on file  . Stress: Not on file  Relationships  . Social Herbalist on phone: Not on file    Gets together: Not on file    Attends  religious service: Not on file    Active member of club or organization: Not on file    Attends meetings of clubs or organizations: Not on file    Relationship status: Not on file  . Intimate partner violence    Fear of current or ex partner: Not on file    Emotionally abused: Not on file    Physically abused: Not on file    Forced sexual activity: Not on file  Other Topics Concern  . Not on file  Social History Narrative   Patient is single with 2 children.   Patient is right handed.  Patient has 12 th grade education.   Patient does not drink caffeine.   Current Outpatient Medications on File Prior to Visit  Medication Sig Dispense Refill  . acetaminophen (TYLENOL) 500 MG tablet Take 500 mg by mouth 2 (two) times daily as needed for headache (pain).     Marland Kitchen albuterol (PROVENTIL HFA;VENTOLIN HFA) 108 (90 Base) MCG/ACT inhaler Inhale 2 puffs into the lungs every 6 (six) hours as needed. 1 Inhaler 0  . benzonatate (TESSALON) 100 MG capsule TAKE 1 CAPSULE(100 MG) BY MOUTH THREE TIMES DAILY AS NEEDED 30 capsule 0  . calcitonin, salmon, (MIACALCIN/FORTICAL) 200 UNIT/ACT nasal spray   0  . carvedilol (COREG) 3.125 MG tablet TAKE 1 TABLET(3.125 MG) BY MOUTH TWICE DAILY WITH A MEAL 60 tablet 5  . cetirizine (ZYRTEC) 10 MG tablet Take 10 mg by mouth at bedtime.     . cholecalciferol (VITAMIN D) 1000 units tablet Take 2,000 Units by mouth 2 (two) times daily.    . Cyanocobalamin (VITAMIN B12 PO) Take 1 tablet by mouth daily.     Marland Kitchen denosumab (PROLIA) 60 MG/ML SOLN injection Inject 60 mg into the skin every 6 (six) months. Administer in upper arm, thigh, or abdomen (last injection June or July 2018)    . diclofenac sodium (VOLTAREN) 1 % GEL Apply 1 application topically 4 (four) times daily as needed (arm pain/bursitis).    Marland Kitchen diltiazem (CARTIA XT) 240 MG 24 hr capsule TAKE 1 CAPSULE(240 MG) BY MOUTH DAILY 30 capsule 5  . doxycycline (VIBRA-TABS) 100 MG tablet Take 1 tablet (100 mg total) by mouth 2  (two) times daily for 5 days. 10 tablet 0  . DULoxetine (CYMBALTA) 20 MG capsule TAKE 1 CAPSULE BY MOUTH EVERY DAY 90 capsule 1  . ELIQUIS 5 MG TABS tablet TAKE 1 TABLET(5 MG) BY MOUTH TWICE DAILY 60 tablet 5  . fenofibrate (TRICOR) 145 MG tablet TAKE 1 TABLET(145 MG) BY MOUTH DAILY 90 tablet 1  . ferrous sulfate 325 (65 FE) MG EC tablet TAKE 1 TABLET(325 MG) BY MOUTH TWICE DAILY BEFORE A MEAL 60 tablet 3  . fluticasone (FLONASE) 50 MCG/ACT nasal spray INSTILL 1 SPRAY INTO EACH NOSTRIL TWICE DAILY 48 g 4  . furosemide (LASIX) 20 MG tablet TAKE 1 TABLET(20 MG) BY MOUTH LEGS DAILY AS NEEDED FOR SWELLING IN THE LEGS 30 tablet 0  . losartan (COZAAR) 50 MG tablet Take 1 tablet (50 mg total) by mouth daily. 90 tablet 1  . Multiple Vitamins-Minerals (PRESERVISION AREDS PO) Take 1 tablet by mouth 3 (three) times daily.     Marland Kitchen oxybutynin (DITROPAN-XL) 5 MG 24 hr tablet TAKE 1 TABLET BY MOUTH ONCE DAILY 90 tablet 1  . pantoprazole (PROTONIX) 20 MG tablet TAKE 1 TABLET BY MOUTH EVERY DAY 90 tablet 1  . Polyethyl Glycol-Propyl Glycol (SYSTANE OP) Place 1 drop into both eyes 2 (two) times daily as needed (dry eyes).    . traMADol (ULTRAM) 50 MG tablet Take 1 tablet (50 mg total) by mouth every 6 (six) hours as needed. 30 tablet 0  . fluticasone (FLOVENT HFA) 44 MCG/ACT inhaler Inhale 2 puffs into the lungs 2 (two) times daily for 14 days. 1 Inhaler 0   No current facility-administered medications on file prior to visit.    Allergies  Allergen Reactions  . Levaquin [Levofloxacin] Other (See Comments)    Severe intermittent arm pain  . Ace Inhibitors Cough  . Codeine Other (See Comments)    Unknown reaction - didn't feel  well?  . Penicillins Other (See Comments)    Caused high fever Has patient had a PCN reaction causing immediate rash, facial/tongue/throat swelling, SOB or lightheadedness with hypotension: No Has patient had a PCN reaction causing severe rash involving mucus membranes or skin necrosis:  No Has patient had a PCN reaction that required hospitalization: No Has patient had a PCN reaction occurring within the last 10 years: No If all of the above answers are "NO", then may proceed with Cephalosporin use.  . Sulfa Antibiotics Other (See Comments)    Possibly caused fever  . Sulfasalazine Other (See Comments)    Possibly caused fever   Family History  Problem Relation Age of Onset  . Hypertension Father   . Hypertension Mother   . Cancer - Lung Brother    PE: BP 128/70   Pulse 86   Ht 4\' 8"  (1.422 m)   Wt 143 lb (64.9 kg)   SpO2 97%   BMI 32.06 kg/m  Wt Readings from Last 3 Encounters:  06/28/19 143 lb (64.9 kg)  06/23/19 146 lb 12.8 oz (66.6 kg)  02/26/19 155 lb 9.6 oz (70.6 kg)   Constitutional: Normal weight, in NAD, in wheelchair Eyes: PERRLA, EOMI, no exophthalmos ENT: moist mucous membranes, no thyromegaly, no cervical lymphadenopathy Cardiovascular: Irregularly irregular rhythm, RR, No RG, +1/6 SEM + pitting edema bilaterally Respiratory: CTA B Gastrointestinal: abdomen soft, NT, ND, BS+ Musculoskeletal: + Kyphosis, strength intact in all 4 Skin: moist, warm, no rashes Neurological: no tremor with outstretched hands, DTR normal in all 4  Assessment: 1. Osteoporosis  2. Vit D deficiency  3.  Weight loss  Plan: 1. Osteoporosis -Likely postmenopausal +/- secondary to steroids -She had a humeral fracture in 2013, no fractures since -We reviewed together scan from 2018: Improved at the spine but slightly worse at the level of the hip -She continues Prolia and completed 5 years.  Latest injection was 01/25/2019.  However, reviewing the chart, she did not have the injection in 06/2018.  I discussed with the patient and her caregiver that these injections should absolutely be spaced every 6 months, otherwise, the patient will lose bone density and have much higher risk for fracture.  I advised her that the next injection will be in 07/2019 and the one  afterwards in 01/2020.  -After Prolia, we cannot use bisphosphonates due to the decreased GFR.  I plan to continue Prolia for at least 10 years -No side effects from Prolia: No thigh pain, no jaw pain.  She does have left hip pain related to bursitis. -No dental work ongoing or coming up -We will arrange for new Prolia injection -We will order another DXA scan now, 2 years from the previous -We will recheck a BMP today - RTC in 1 year  2. Vit D def -Continue 4000 units vitamin D daily -Latest level was normal, 40.63 last year -Recheck level today  3.  Hypocalcemia -Latest to calcium levels have been low and also has a low PTH in the past -It is possible that her recent calcium levels are related to Prolia -We will recheck this today along with a magnesium level  Component     Latest Ref Rng & Units 06/28/2019          Glucose     65 - 99 mg/dL 107 (H)  BUN     7 - 25 mg/dL 26 (H)  Creatinine     0.60 - 0.88 mg/dL 1.28 (H)  GFR, Est Non  African American     > OR = 60 mL/min/1.74m2 37 (L)  GFR, Est African American     > OR = 60 mL/min/1.44m2 43 (L)  BUN/Creatinine Ratio     6 - 22 (calc) 20  Sodium     135 - 146 mmol/L 140  Potassium     3.5 - 5.3 mmol/L 4.4  Chloride     98 - 110 mmol/L 107  CO2     20 - 32 mmol/L 27  Calcium     8.6 - 10.4 mg/dL 9.3  VITD     30.00 - 100.00 ng/mL 60.98  Magnesium     1.5 - 2.5 mg/dL 1.6   Vitamin D, magnesium, calcium normal. GFR more than 30, so we can continue with Prolia.   Lumbar spine L1-L4 (L3) Femoral neck (FN)  T-score   0.6 RFN: -1.6 LFN: -1.5  Change in BMD from previous DXA test (%) Down 5.3*% Up 1.9%  (*) statistically significant  Her T-scores appear to be lower, but at the level of the spine this may be due to to analyzing different vertebrae.  Also, the overall decrease could be due to missing Prolia dosage.  I am hoping that she can start taking consistently from now on.  Philemon Kingdom, MD PhD [facility]  Endocrinology

## 2019-06-28 NOTE — Patient Instructions (Signed)
Please stop at the lab.  Please come back for Prolia in 07/2019.  Please come back for a follow-up appointment in 1 year.

## 2019-06-29 LAB — VITAMIN D 25 HYDROXY (VIT D DEFICIENCY, FRACTURES): VITD: 60.98 ng/mL (ref 30.00–100.00)

## 2019-06-29 LAB — BASIC METABOLIC PANEL WITH GFR
BUN/Creatinine Ratio: 20 (calc) (ref 6–22)
BUN: 26 mg/dL — ABNORMAL HIGH (ref 7–25)
CO2: 27 mmol/L (ref 20–32)
Calcium: 9.3 mg/dL (ref 8.6–10.4)
Chloride: 107 mmol/L (ref 98–110)
Creat: 1.28 mg/dL — ABNORMAL HIGH (ref 0.60–0.88)
GFR, Est African American: 43 mL/min/{1.73_m2} — ABNORMAL LOW (ref >=60)
GFR, Est Non African American: 37 mL/min/{1.73_m2} — ABNORMAL LOW (ref >=60)
Glucose, Bld: 107 mg/dL — ABNORMAL HIGH (ref 65–99)
Potassium: 4.4 mmol/L (ref 3.5–5.3)
Sodium: 140 mmol/L (ref 135–146)

## 2019-06-29 LAB — MAGNESIUM: Magnesium: 1.6 mg/dL (ref 1.5–2.5)

## 2019-07-07 ENCOUNTER — Telehealth (INDEPENDENT_AMBULATORY_CARE_PROVIDER_SITE_OTHER): Payer: Medicare Other | Admitting: Family Medicine

## 2019-07-07 ENCOUNTER — Telehealth: Payer: Self-pay

## 2019-07-07 ENCOUNTER — Other Ambulatory Visit: Payer: Self-pay

## 2019-07-07 DIAGNOSIS — L84 Corns and callosities: Secondary | ICD-10-CM

## 2019-07-07 DIAGNOSIS — M79676 Pain in unspecified toe(s): Secondary | ICD-10-CM

## 2019-07-07 NOTE — Telephone Encounter (Signed)
[facility] for podiatry referral. Not sure about timing of getting in but could request urgent. Would be nice to have virtual visit to look and treat if infected (with [place] in meanwhile in case not able to get in within 24 hrs with podiatry). It was definitely not infected in office and shaving I did was very minimal, but if red, tender, perhaps virtual could be set up [facility] tomorrow) for checking this. Also, we had send abx for legs that were swollen so if they are not able to do appointment tomorrow with [facility] see if you can get more details about if they started this, when they started, how legs are doing, etc.

## 2019-07-07 NOTE — Telephone Encounter (Signed)
Spoke with Garnett Farm the pts caregiver and informed her of the message below.  Garnett Farm stated the toe is redness and does not look good to her.  Appt scheduled for today with Dr Martinique.

## 2019-07-07 NOTE — Telephone Encounter (Signed)
Copied from [place] [id number]. Topic: Referral - Request for Referral >> Jul 07, 2019  7:14 AM Carolyn Stare wrote: Req a referral to a podiatrist for right little toe that she had worked on in the office, pt care giver said toe is infected an they need to see someone asap, pt also was suppose to have her flu shot had at last visit and didn't, would like to have it

## 2019-07-07 NOTE — Progress Notes (Signed)
Virtual Visit via Video Note   I connected with Charlene Wade and her caregiver on 07/07/19 by a video enabled telemedicine application and verified that I am speaking with the correct person using two identifiers.  Location patient: home Location provider:work office Persons participating in the virtual visit: patient, provider  I discussed the limitations of evaluation and management by telemedicine and the availability of in person appointments. The patient expressed understanding and agreed to proceed.   HPI: Charlene Wade is a 85 yo female with hx of hearing loss,atrial fib,HTN,and CKD III who is c/o 5th right toe pain. She is going on for 3-4 weeks. She denies history of trauma, new pair of shoes.  She recently completed antibiotic treatment with doxycycline for "cellulitis", which did not help with toe pain.  Pain is sharp,exacerbated by wearing shoes,"it hurts very much." When she is barefoot, pain is minimal or not present. Pain does not interfere with sleep. It seems to be edematous and mildly erythematous (better today). She has not noted local drainage or significant local heat.  She has tried Tylenol 500 mg, which does not help much when she is already having pain.  Negative for fever, chills, unusual fatigue, change in appetite, or Charlene changes. She wonders if she needs more antibiotics.  Problem is stable. Requesting referral for podiatrist.  ROS: See pertinent positives and negatives per HPI.  Past Medical History:  Diagnosis Date  . Allergic rhinitis   . Atrial fibrillation ([place]) 09/26/2017   New-onset A. Fib now persistent  . BCC (basal cell carcinoma of skin) 04/10/2015   sees dermatologist  . CKD (chronic kidney disease) stage 3, GFR 30-59 ml/min ([place]) 04/26/2015  . Depression 10/17/2012  . Ectopic pregnancy   . GERD (gastroesophageal reflux disease)    hx hiatal hernia, has seen GI, ENT and allergist in the past  . Hyperlipidemia   . Hypertension   . OA  (osteoarthritis)    knees and back, hx DDD, s/p remote lumbar disc surgery  . OAB (overactive bladder) 04/10/2015  . Obesity   . Osteoporosis    on prolia in the past  . Stroke [facility])    sees neurologist, hx memory loss, expressive aphasia  . Venous insufficiency     Past Surgical History:  Procedure Laterality Date  . ABDOMINAL HYSTERECTOMY    . APPENDECTOMY    . BREAST REDUCTION SURGERY    . LAPAROSCOPIC SALPINGOOPHERECTOMY    . NASAL MASS EXCISION     Basal Cell Cancer Excision  . NASAL RECONSTRUCTION    . TEE WITHOUT CARDIOVERSION  10/22/2012   Procedure: TRANSESOPHAGEAL ECHOCARDIOGRAM (TEE);  Surgeon: Josue Hector, MD;  Location: [facility] ENDOSCOPY;  Service: Cardiovascular;  Laterality: N/A;    Family History  Problem Relation Age of Onset  . Hypertension Father   . Hypertension Mother   . Cancer - Lung Brother     Social History   Socioeconomic History  . Marital status: Widowed    Spouse name: Not on file  . Number of children: 2  . Years of education: 24  . Highest education level: Not on file  Occupational History  . Occupation: retired  Scientific laboratory technician  . Financial resource strain: Not on file  . Food insecurity    Worry: Not on file    Inability: Not on file  . Transportation needs    Medical: Not on file    Non-medical: Not on file  Tobacco Use  . Smoking status: Former Research scientist (life sciences)  .  Smokeless tobacco: Never Used  Substance and Sexual Activity  . Alcohol use: No  . Drug use: No  . Sexual activity: Never  Lifestyle  . Physical activity    Days per week: Not on file    Minutes per session: Not on file  . Stress: Not on file  Relationships  . Social Herbalist on phone: Not on file    Gets together: Not on file    Attends religious service: Not on file    Active member of club or organization: Not on file    Attends meetings of clubs or organizations: Not on file    Relationship status: Not on file  . Intimate partner violence    Fear of  current or ex partner: Not on file    Emotionally abused: Not on file    Physically abused: Not on file    Forced sexual activity: Not on file  Other Topics Concern  . Not on file  Social History Narrative   Patient is single with 2 children.   Patient is right handed.   Patient has 12 th grade education.   Patient does not drink caffeine.      Current Outpatient Medications:  .  acetaminophen (TYLENOL) 500 MG tablet, Take 500 mg by mouth 2 (two) times daily as needed for headache (pain). , Disp: , Rfl:  .  albuterol (PROVENTIL HFA;VENTOLIN HFA) 108 (90 Base) MCG/ACT inhaler, Inhale 2 puffs into the lungs every 6 (six) hours as needed., Disp: 1 Inhaler, Rfl: 0 .  benzonatate (TESSALON) 100 MG capsule, TAKE 1 CAPSULE(100 MG) BY MOUTH THREE TIMES DAILY AS NEEDED, Disp: 30 capsule, Rfl: 0 .  calcitonin, salmon, (MIACALCIN/FORTICAL) 200 UNIT/ACT nasal spray, , Disp: , Rfl: 0 .  carvedilol (COREG) 3.125 MG tablet, TAKE 1 TABLET(3.125 MG) BY MOUTH TWICE DAILY WITH A MEAL, Disp: 60 tablet, Rfl: 5 .  cetirizine (ZYRTEC) 10 MG tablet, Take 10 mg by mouth at bedtime. , Disp: , Rfl:  .  cholecalciferol (VITAMIN D) 1000 units tablet, Take 2,000 Units by mouth 2 (two) times daily., Disp: , Rfl:  .  Cyanocobalamin (VITAMIN B12 PO), Take 1 tablet by mouth daily. , Disp: , Rfl:  .  denosumab (PROLIA) 60 MG/ML SOLN injection, Inject 60 mg into the skin every 6 (six) months. Administer in upper arm, thigh, or abdomen (last injection June or July 2018), Disp: , Rfl:  .  diclofenac sodium (VOLTAREN) 1 % GEL, Apply 1 application topically 4 (four) times daily as needed (arm pain/bursitis)., Disp: , Rfl:  .  diltiazem (CARTIA XT) 240 MG 24 hr capsule, TAKE 1 CAPSULE(240 MG) BY MOUTH DAILY, Disp: 30 capsule, Rfl: 5 .  DULoxetine (CYMBALTA) 20 MG capsule, TAKE 1 CAPSULE BY MOUTH EVERY DAY, Disp: 90 capsule, Rfl: 1 .  ELIQUIS 5 MG TABS tablet, TAKE 1 TABLET(5 MG) BY MOUTH TWICE DAILY, Disp: 60 tablet, Rfl: 5 .   fenofibrate (TRICOR) 145 MG tablet, TAKE 1 TABLET(145 MG) BY MOUTH DAILY, Disp: 90 tablet, Rfl: 1 .  ferrous sulfate 325 (65 FE) MG EC tablet, TAKE 1 TABLET(325 MG) BY MOUTH TWICE DAILY BEFORE A MEAL, Disp: 60 tablet, Rfl: 3 .  fluticasone (FLONASE) 50 MCG/ACT nasal spray, INSTILL 1 SPRAY INTO EACH NOSTRIL TWICE DAILY, Disp: 48 g, Rfl: 4 .  fluticasone (FLOVENT HFA) 44 MCG/ACT inhaler, Inhale 2 puffs into the lungs 2 (two) times daily for 14 days., Disp: 1 Inhaler, Rfl: 0 .  furosemide (LASIX)  20 MG tablet, TAKE 1 TABLET(20 MG) BY MOUTH LEGS DAILY AS NEEDED FOR SWELLING IN THE LEGS, Disp: 30 tablet, Rfl: 0 .  losartan (COZAAR) 50 MG tablet, Take 1 tablet (50 mg total) by mouth daily., Disp: 90 tablet, Rfl: 1 .  Multiple Vitamins-Minerals (PRESERVISION AREDS PO), Take 1 tablet by mouth 3 (three) times daily. , Disp: , Rfl:  .  oxybutynin (DITROPAN-XL) 5 MG 24 hr tablet, TAKE 1 TABLET BY MOUTH ONCE DAILY, Disp: 90 tablet, Rfl: 1 .  pantoprazole (PROTONIX) 20 MG tablet, TAKE 1 TABLET BY MOUTH EVERY DAY, Disp: 90 tablet, Rfl: 1 .  Polyethyl Glycol-Propyl Glycol (SYSTANE OP), Place 1 drop into both eyes 2 (two) times daily as needed (dry eyes)., Disp: , Rfl:  .  traMADol (ULTRAM) 50 MG tablet, Take 1 tablet (50 mg total) by mouth every 6 (six) hours as needed., Disp: 30 tablet, Rfl: 0  EXAM:  VITALS per patient if applicable:N/A  GENERAL: alert,appears well and in no acute distress  HEENT: atraumatic, conjunctiva clear, no obvious abnormalities on inspection.  LUNGS: on inspection no signs of respiratory distress, breathing rate appears normal, no obvious gross SOB, gasping or wheezing  CV: no obvious cyanosis  Charlene: 5th toe does not seem erythematous or edematous, some thickening of skin lateral aspect of distal phalange but I do not appreciate central core. Tender when caregiver palpate area. See picture.  PSYCH/NEURO: pleasant and cooperative, no obvious depression or anxiety.         ASSESSMENT AND PLAN:  Discussed the following assessment and plan:  Pain of fifth toe - Plan: Ambulatory referral to Podiatry  Corn of foot - Plan: Ambulatory referral to Podiatry  We discussed possible etiologies, I do not think is an infectious process, so antibiotic is not recommended at this time.   It seems to be a corn of 5th toe. Because foot wear aggravates pain,recommend avoiding for now. Soaking right foot in warm water with Epson salt bid may also help.  Instructed to monitor for fever, worsening edema, or local redness. Podiatry referral was placed.   I discussed the assessment and treatment plan with the patient. She was provided an opportunity to ask questions and all were answered. She agreed with the plan and demonstrated an understanding of the instructions.   The patient was advised to call back or seek an in-person evaluation if the symptoms worsen or if the condition fails to improve as anticipated.  Return if symptoms worsen or fail to improve.    Analynn Daum Martinique, MD

## 2019-07-10 ENCOUNTER — Other Ambulatory Visit: Payer: Self-pay | Admitting: Family Medicine

## 2019-07-12 ENCOUNTER — Ambulatory Visit (INDEPENDENT_AMBULATORY_CARE_PROVIDER_SITE_OTHER)
Admission: RE | Admit: 2019-07-12 | Discharge: 2019-07-12 | Disposition: A | Discharge status: Home / Self Care | Payer: Medicare Other | Source: Ambulatory Visit | Attending: Internal Medicine | Admitting: Internal Medicine

## 2019-07-12 ENCOUNTER — Other Ambulatory Visit: Payer: Self-pay

## 2019-07-12 DIAGNOSIS — M810 Age-related osteoporosis without current pathological fracture: Secondary | ICD-10-CM

## 2019-07-16 ENCOUNTER — Telehealth: Payer: Self-pay

## 2019-07-16 NOTE — Telephone Encounter (Signed)
-----   Message from Philemon Kingdom, MD sent at 07/15/2019 12:08 PM EDT ----- Lenna Sciara, can you please call pt: Her bone density scores appear to be lower, but at the level of the spine this may be due to  analyzing different vertebrae.  Also, the overall decrease could be due to missing Prolia doses.  I am hoping that she can start taking consistently from now on.  Colletta Maryland, can you please check that patient has an appointment scheduled after 07/28/2019?

## 2019-07-19 ENCOUNTER — Telehealth: Payer: Self-pay | Admitting: Internal Medicine

## 2019-07-19 NOTE — Telephone Encounter (Signed)
-----   Message from Philemon Kingdom, MD sent at 07/15/2019 12:08 PM EDT ----- Lenna Sciara, can you please call pt: Her bone density scores appear to be lower, but at the level of the spine this may be due to  analyzing different vertebrae.  Also, the overall decrease could be due to missing Prolia doses.  I am hoping that she can start taking consistently from now on.  Colletta Maryland, can you please check that patient has an appointment scheduled after 07/28/2019?

## 2019-07-19 NOTE — Telephone Encounter (Signed)
ATC pt Prolia is covered in full. OK to be scheduled

## 2019-07-19 NOTE — Telephone Encounter (Signed)
ATC pt  She is clear for prolia with no copay Ok to be scheduled after 07/28/2019

## 2019-07-21 NOTE — Telephone Encounter (Signed)
Please contact patient for Prolia appointment after 07/28/2019. Thank you.

## 2019-07-21 NOTE — Telephone Encounter (Signed)
LMTCB to schedule appointment (Need to schedule Prolia injection after 07/28/19-no Copay due)

## 2019-07-22 ENCOUNTER — Other Ambulatory Visit: Payer: Self-pay | Admitting: Family Medicine

## 2019-07-23 ENCOUNTER — Other Ambulatory Visit: Payer: Self-pay | Admitting: Family Medicine

## 2019-07-27 NOTE — Progress Notes (Signed)
Virtual Visit via Video Note  I connected with Charlene Wade  on 07/28/19 at  2:00 PM EDT by a video enabled telemedicine application and verified that I am speaking with the correct person using two identifiers.  Location patient: home Location provider:work or home office Persons participating in the virtual visit: patient, provider  I discussed the limitations of evaluation and management by telemedicine and the availability of in person appointments. The patient expressed understanding and agreed to proceed.  Video feed not working so visit completed by phone.    Charlene Wade DOB: 11/20/1927 Encounter date: 07/28/2019  This is a 85 y.o. female who presents with Follow up for leg swelling, blood pressure, toe pain.   History of present illness:  HTN/A fib: coreg, diltiazem  5th toe pain: still there, but hasn't had to wear shoes so hasn't been that bad. Patient can't see it so doesn't know what it looks like. She had podiatry appointment coming up in October. Caregiver states red, crusty. Thinks wart. Just painful with shoe. Thinks that diabetic sock cause this.   Legs did get better after antibiotic but now are bad again. Get red, hot. They are a little painful. Left one hurts more than right. Taking the furosemide, but not sure it makes difference. Feels like she is going to the bathroom a lot even without the furosemide. Got up 3 times last night to urinate. Goes a lot. Not emptying completely when she goes. Feels that this is more recent issue. Has had bladder lifted twice. Prunes help with constipation; usually regularly. For awhile after antibiotic feels that legs look a little better and she feels better. Notes that they are less red and not quite as swollen when antibiotic is working. Doesn't feel like swelling ever goes away completely. Not getting up early enough to get stockings on in morning. Redness is between knee and ankle. They state that it looks like it is starting  to blister.   Caregiver states that whole toes are red, that there is redness along side of foot where pinky toe is.   Has a lot of pain - bursitis, back pain. Getting tramadol from Dr. Nelva Bush.   GERD: takes the protonix on regular basis. Does feel like this has helped with silent reflux; cough. Uses tessalon perles just if needed.   Had allergy testing; negative except for mold. Has a little allergy in spring. But has had this cough long term. Nothing was found, but she still has cough. Imaging head, lungs.   Urinary frequency: Has had this for some time.  Has been on oxybutynin for the suspect 10 years.  No discomfort with urination.  Just feels like she is going a lot through the day.  Also wakes up at least 3 times a night to go.  This is difficult to keep legs elevated when she is constantly having to go to the bathroom.  Does not feel that she is emptying completely.  Allergies  Allergen Reactions  . Levaquin [Levofloxacin] Other (See Comments)    Severe intermittent arm pain  . Ace Inhibitors Cough  . Codeine Other (See Comments)    Unknown reaction - didn't feel well?  . Penicillins Other (See Comments)    Caused high fever Has patient had a PCN reaction causing immediate rash, facial/tongue/throat swelling, SOB or lightheadedness with hypotension: No Has patient had a PCN reaction causing severe rash involving mucus membranes or skin necrosis: No Has patient had a PCN reaction that required  hospitalization: No Has patient had a PCN reaction occurring within the last 10 years: No If all of the above answers are "NO", then may proceed with Cephalosporin use.  . Sulfa Antibiotics Other (See Comments)    Possibly caused fever  . Sulfasalazine Other (See Comments)    Possibly caused fever   Current Meds  Medication Sig  . acetaminophen (TYLENOL) 500 MG tablet Take 500 mg by mouth 2 (two) times daily as needed for headache (pain).   Marland Kitchen albuterol (PROVENTIL HFA;VENTOLIN HFA) 108 (90  Base) MCG/ACT inhaler Inhale 2 puffs into the lungs every 6 (six) hours as needed.  . benzonatate (TESSALON) 100 MG capsule TAKE 1 CAPSULE(100 MG) BY MOUTH THREE TIMES DAILY AS NEEDED  . calcitonin, salmon, (MIACALCIN/FORTICAL) 200 UNIT/ACT nasal spray   . carvedilol (COREG) 3.125 MG tablet TAKE 1 TABLET(3.125 MG) BY MOUTH TWICE DAILY WITH A MEAL  . cetirizine (ZYRTEC) 10 MG tablet Take 10 mg by mouth at bedtime.   . cholecalciferol (VITAMIN D) 1000 units tablet Take 2,000 Units by mouth 2 (two) times daily.  . Cyanocobalamin (VITAMIN B12 PO) Take 1 tablet by mouth daily.   Marland Kitchen denosumab (PROLIA) 60 MG/ML SOLN injection Inject 60 mg into the skin every 6 (six) months. Administer in upper arm, thigh, or abdomen (last injection June or July 2018)  . diclofenac sodium (VOLTAREN) 1 % GEL Apply 1 application topically 4 (four) times daily as needed (arm pain/bursitis).  Marland Kitchen diltiazem (CARTIA XT) 240 MG 24 hr capsule TAKE 1 CAPSULE(240 MG) BY MOUTH DAILY  . DULoxetine (CYMBALTA) 20 MG capsule TAKE 1 CAPSULE BY MOUTH EVERY DAY  . ELIQUIS 5 MG TABS tablet TAKE 1 TABLET(5 MG) BY MOUTH TWICE DAILY  . fenofibrate (TRICOR) 145 MG tablet TAKE 1 TABLET(145 MG) BY MOUTH DAILY  . ferrous sulfate 325 (65 FE) MG EC tablet TAKE 1 TABLET(325 MG) BY MOUTH TWICE DAILY BEFORE A MEAL  . fluticasone (FLONASE) 50 MCG/ACT nasal spray INSTILL 1 SPRAY INTO EACH NOSTRIL TWICE DAILY  . furosemide (LASIX) 20 MG tablet TAKE 1 TABLET(20 MG) BY MOUTH DAILY AS NEEDED FOR SWELLING IN LEGS   . losartan (COZAAR) 50 MG tablet Take 1 tablet (50 mg total) by mouth daily.  . Multiple Vitamins-Minerals (PRESERVISION AREDS PO) Take 1 tablet by mouth 3 (three) times daily.   Marland Kitchen oxybutynin (DITROPAN-XL) 5 MG 24 hr tablet TAKE 1 TABLET BY MOUTH EVERY DAY  . pantoprazole (PROTONIX) 20 MG tablet TAKE 1 TABLET BY MOUTH EVERY DAY  . Polyethyl Glycol-Propyl Glycol (SYSTANE OP) Place 1 drop into both eyes 2 (two) times daily as needed (dry eyes).  .  traMADol (ULTRAM) 50 MG tablet Take 1 tablet (50 mg total) by mouth every 6 (six) hours as needed.    Review of Systems  Constitutional: Negative for chills, fatigue and fever.  Respiratory: Negative for cough, chest tightness, shortness of breath and wheezing.   Cardiovascular: Positive for leg swelling. Negative for chest pain and palpitations.  Musculoskeletal: Positive for arthralgias and back pain.  Skin: Positive for color change.    Objective:  There were no vitals taken for this visit.      BP Readings from Last 3 Encounters:  06/28/19 128/70  06/23/19 112/60  02/26/19 (!) 146/63   Wt Readings from Last 3 Encounters:  06/28/19 143 lb (64.9 kg)  06/23/19 146 lb 12.8 oz (66.6 kg)  02/26/19 155 lb 9.6 oz (70.6 kg)    EXAM:  GENERAL: alert, oriented, sounds well  and in no acute distress  LUNGS: No shortness of breath while talking.  She did have coughing episode on the phone, but was able to recover within a minute and back to normal dialogue.  PSYCH/NEURO: pleasant and cooperative, no obvious depression or anxiety, speech and thought processing grossly intact  Assessment/Plan 1. Essential hypertension Has been well controlled.  Continue current medications.  Blood work recently done was stable.  2. Pure hypercholesterolemia Has been stable.  Continue medications.  3. Edema of both legs Continue to elevate.  She is not really able to wear compression stockings since she has difficulty with getting these on and caregiver is not able to put them on for her during the day because she cannot reliably take them off.  They do feel the legs are red, more tender, and starting to have some early blistering.  She did do well on the doxycycline previously.  I refilled this for her today.  Although we discussed it is not great for her to continuously be on antibiotics, we also do not want her to develop worsening infection.  She does have podiatry visit coming up, so they can  hopefully be legs and give some input at that time.  There is also some concern that there is redness on the foot where she has been having toe tenderness, so I have asked him to monitor this.  I have sent email to try to get them hooked up with my chart that they can send pictures and updates to their, since coming in for office visits is somewhat difficult for them. - doxycycline (VIBRA-TABS) 100 MG tablet; Take 1 tablet (100 mg total) by mouth 2 (two) times daily for 7 days.  Dispense: 14 tablet; Refill: 0  4. Urinary frequency This is an ongoing issue.  I encouraged her to double up on her oxybutynin and update me in 2 weeks time to see if this helps with urinary frequency during the day.  If this is not helping, we can get urinalysis or consider urology for input.  Return for pending mychart update in 2 weeks time.      I discussed the assessment and treatment plan with the patient. The patient was provided an opportunity to ask questions and all were answered. The patient agreed with the plan and demonstrated an understanding of the instructions.   The patient was advised to call back or seek an in-person evaluation if the symptoms worsen or if the condition fails to improve as anticipated.  I provided 32 minutes of non-face-to-face time during this encounter.   Micheline Rough, MD

## 2019-07-28 ENCOUNTER — Other Ambulatory Visit: Payer: Self-pay

## 2019-07-28 ENCOUNTER — Encounter: Payer: Self-pay | Admitting: Family Medicine

## 2019-07-28 ENCOUNTER — Ambulatory Visit (INDEPENDENT_AMBULATORY_CARE_PROVIDER_SITE_OTHER): Payer: Medicare Other | Admitting: Family Medicine

## 2019-07-28 ENCOUNTER — Telehealth: Payer: Self-pay | Admitting: *Deleted

## 2019-07-28 DIAGNOSIS — R350 Frequency of micturition: Secondary | ICD-10-CM | POA: Diagnosis not present

## 2019-07-28 DIAGNOSIS — R600 Localized edema: Secondary | ICD-10-CM

## 2019-07-28 DIAGNOSIS — E7800 Pure hypercholesterolemia, unspecified: Secondary | ICD-10-CM | POA: Diagnosis not present

## 2019-07-28 DIAGNOSIS — I10 Essential (primary) hypertension: Secondary | ICD-10-CM | POA: Diagnosis not present

## 2019-07-28 MED ORDER — DOXYCYCLINE HYCLATE 100 MG PO TABS: 100.0000 mg | ORAL_TABLET | Freq: Two times a day (BID) | ORAL | 0 refills | Status: AC

## 2019-07-28 NOTE — Telephone Encounter (Signed)
Mychart link sent.

## 2019-07-28 NOTE — Telephone Encounter (Signed)
-----   Message from Caren Macadam, MD sent at 07/28/2019  2:37 PM EDT ----- [email or web address] (please send mychart link info to this email address)

## 2019-07-30 ENCOUNTER — Ambulatory Visit (INDEPENDENT_AMBULATORY_CARE_PROVIDER_SITE_OTHER): Payer: Medicare Other | Admitting: Podiatry

## 2019-07-30 ENCOUNTER — Other Ambulatory Visit: Payer: Self-pay

## 2019-07-30 DIAGNOSIS — M2041 Other hammer toe(s) (acquired), right foot: Secondary | ICD-10-CM | POA: Diagnosis not present

## 2019-08-03 ENCOUNTER — Other Ambulatory Visit: Payer: Self-pay | Admitting: Family Medicine

## 2019-08-04 ENCOUNTER — Ambulatory Visit (INDEPENDENT_AMBULATORY_CARE_PROVIDER_SITE_OTHER): Payer: Medicare Other

## 2019-08-04 ENCOUNTER — Other Ambulatory Visit: Payer: Self-pay

## 2019-08-04 DIAGNOSIS — M810 Age-related osteoporosis without current pathological fracture: Secondary | ICD-10-CM

## 2019-08-04 MED ORDER — DENOSUMAB 60 MG/ML ~~LOC~~ SOSY
60.0000 mg | PREFILLED_SYRINGE | Freq: Once | SUBCUTANEOUS | Status: AC
  Administered 2019-08-04: 60 mg via SUBCUTANEOUS

## 2019-08-04 NOTE — Progress Notes (Signed)
Per orders of Dr. Gherghe injection of Prolia given today by Rilla Buckman, Certified Medical Assistant . Patient tolerated injection well.  

## 2019-08-17 ENCOUNTER — Other Ambulatory Visit: Payer: Self-pay

## 2019-08-17 ENCOUNTER — Ambulatory Visit (INDEPENDENT_AMBULATORY_CARE_PROVIDER_SITE_OTHER): Payer: Medicare Other

## 2019-08-17 DIAGNOSIS — Z23 Encounter for immunization: Secondary | ICD-10-CM | POA: Diagnosis not present

## 2019-08-28 NOTE — Progress Notes (Signed)
Subjective:  Patient ID: Charlene Wade, female    DOB: 03-20-1928,  MRN: [id number]  Chief Complaint  Patient presents with  . Toe Pain    Right 5th painful lesion 2 month duration no known injury, denies drainage.    85 y.o. female presents with the above complaint.  Reports lesion to the fifth toe has been palpation worse with shoe gear.   Review of Systems: Negative except as noted in the HPI. Denies N/V/F/Ch.  Past Medical History:  Diagnosis Date  . Allergic rhinitis   . Atrial fibrillation ([place]) 09/26/2017   New-onset A. Fib now persistent  . BCC (basal cell carcinoma of skin) 04/10/2015   sees dermatologist  . CKD (chronic kidney disease) stage 3, GFR 30-59 ml/min ([place]) 04/26/2015  . Depression 10/17/2012  . Ectopic pregnancy   . GERD (gastroesophageal reflux disease)    hx hiatal hernia, has seen GI, ENT and allergist in the past  . Hyperlipidemia   . Hypertension   . OA (osteoarthritis)    knees and back, hx DDD, s/p remote lumbar disc surgery  . OAB (overactive bladder) 04/10/2015  . Obesity   . Osteoporosis    on prolia in the past  . Stroke [facility])    sees neurologist, hx memory loss, expressive aphasia  . Venous insufficiency     Current Outpatient Medications:  .  acetaminophen (TYLENOL) 500 MG tablet, Take 500 mg by mouth 2 (two) times daily as needed for headache (pain). , Disp: , Rfl:  .  albuterol (PROVENTIL HFA;VENTOLIN HFA) 108 (90 Base) MCG/ACT inhaler, Inhale 2 puffs into the lungs every 6 (six) hours as needed., Disp: 1 Inhaler, Rfl: 0 .  benzonatate (TESSALON) 100 MG capsule, TAKE 1 CAPSULE(100 MG) BY MOUTH THREE TIMES DAILY AS NEEDED, Disp: 30 capsule, Rfl: 0 .  calcitonin, salmon, (MIACALCIN/FORTICAL) 200 UNIT/ACT nasal spray, , Disp: , Rfl: 0 .  carvedilol (COREG) 3.125 MG tablet, TAKE 1 TABLET(3.125 MG) BY MOUTH TWICE DAILY WITH A MEAL, Disp: 60 tablet, Rfl: 5 .  cetirizine (ZYRTEC) 10 MG tablet, Take 10 mg by mouth at bedtime. , Disp: , Rfl:   .  cholecalciferol (VITAMIN D) 1000 units tablet, Take 2,000 Units by mouth 2 (two) times daily., Disp: , Rfl:  .  Cyanocobalamin (VITAMIN B12 PO), Take 1 tablet by mouth daily. , Disp: , Rfl:  .  denosumab (PROLIA) 60 MG/ML SOLN injection, Inject 60 mg into the skin every 6 (six) months. Administer in upper arm, thigh, or abdomen (last injection June or July 2018), Disp: , Rfl:  .  diclofenac sodium (VOLTAREN) 1 % GEL, Apply 1 application topically 4 (four) times daily as needed (arm pain/bursitis)., Disp: , Rfl:  .  diltiazem (CARTIA XT) 240 MG 24 hr capsule, TAKE 1 CAPSULE(240 MG) BY MOUTH DAILY, Disp: 30 capsule, Rfl: 5 .  DULoxetine (CYMBALTA) 20 MG capsule, TAKE 1 CAPSULE BY MOUTH EVERY DAY, Disp: 90 capsule, Rfl: 1 .  ELIQUIS 5 MG TABS tablet, TAKE 1 TABLET(5 MG) BY MOUTH TWICE DAILY, Disp: 60 tablet, Rfl: 5 .  fenofibrate (TRICOR) 145 MG tablet, TAKE 1 TABLET(145 MG) BY MOUTH DAILY, Disp: 90 tablet, Rfl: 1 .  ferrous sulfate 325 (65 FE) MG EC tablet, TAKE 1 TABLET(325 MG) BY MOUTH TWICE DAILY BEFORE A MEAL, Disp: 60 tablet, Rfl: 3 .  fluticasone (FLONASE) 50 MCG/ACT nasal spray, INSTILL 1 SPRAY INTO EACH NOSTRIL TWICE DAILY, Disp: 48 g, Rfl: 4 .  fluticasone (FLOVENT HFA) 44 MCG/ACT inhaler,  Inhale 2 puffs into the lungs 2 (two) times daily for 14 days., Disp: 1 Inhaler, Rfl: 0 .  furosemide (LASIX) 20 MG tablet, TAKE 1 TABLET(20 MG) BY MOUTH DAILY AS NEEDED FOR SWELLING IN LEGS , Disp: 90 tablet, Rfl: 0 .  losartan (COZAAR) 50 MG tablet, Take 1 tablet (50 mg total) by mouth daily., Disp: 90 tablet, Rfl: 1 .  Multiple Vitamins-Minerals (PRESERVISION AREDS PO), Take 1 tablet by mouth 3 (three) times daily. , Disp: , Rfl:  .  oxybutynin (DITROPAN-XL) 5 MG 24 hr tablet, TAKE 1 TABLET BY MOUTH EVERY DAY, Disp: 90 tablet, Rfl: 1 .  pantoprazole (PROTONIX) 20 MG tablet, TAKE 1 TABLET BY MOUTH EVERY DAY, Disp: 90 tablet, Rfl: 1 .  Polyethyl Glycol-Propyl Glycol (SYSTANE OP), Place 1 drop into both  eyes 2 (two) times daily as needed (dry eyes)., Disp: , Rfl:  .  traMADol (ULTRAM) 50 MG tablet, Take 1 tablet (50 mg total) by mouth every 6 (six) hours as needed., Disp: 30 tablet, Rfl: 0  Social History   Tobacco Use  Smoking Status Former Smoker  Smokeless Tobacco Never Used    Allergies  Allergen Reactions  . Levaquin [Levofloxacin] Other (See Comments)    Severe intermittent arm pain  . Ace Inhibitors Cough  . Codeine Other (See Comments)    Unknown reaction - didn't feel well?  . Penicillins Other (See Comments)    Caused high fever Has patient had a PCN reaction causing immediate rash, facial/tongue/throat swelling, SOB or lightheadedness with hypotension: No Has patient had a PCN reaction causing severe rash involving mucus membranes or skin necrosis: No Has patient had a PCN reaction that required hospitalization: No Has patient had a PCN reaction occurring within the last 10 years: No If all of the above answers are "NO", then may proceed with Cephalosporin use.  . Sulfa Antibiotics Other (See Comments)    Possibly caused fever  . Sulfasalazine Other (See Comments)    Possibly caused fever   Objective:  There were no vitals filed for this visit. There is no height or weight on file to calculate BMI. Constitutional Well developed. Well nourished.  Vascular Dorsalis pedis pulses palpable bilaterally. Posterior tibial pulses palpable bilaterally. Capillary refill normal to all digits.  No cyanosis or clubbing noted. Pedal hair growth normal.  Neurologic Normal speech. Oriented to person, place, and time. Epicritic sensation to light touch grossly present bilaterally.  Dermatologic Nails normal Skin hyperkeratosis right fifth toe  Orthopedic: Normal joint ROM without pain or crepitus bilaterally. No visible deformities. No bony tenderness.   Radiographs: None Assessment:   1. Hammertoe of right foot    Plan:  Patient was evaluated and treated and all  questions answered.  Hammertoe right fifth toe with corn -Corn gently debrided.  Kinesiology.  Offloading pads dispensed.  Educated on self-care follow-up as needed  No follow-ups on file.

## 2019-09-20 DIAGNOSIS — M5136 Other intervertebral disc degeneration, lumbar region: Secondary | ICD-10-CM | POA: Diagnosis not present

## 2019-09-22 ENCOUNTER — Other Ambulatory Visit: Payer: Self-pay

## 2019-09-22 ENCOUNTER — Other Ambulatory Visit: Payer: Self-pay | Admitting: Family Medicine

## 2019-10-15 IMAGING — MR MR MRA HEAD W/O CM
7 of 9 series · 36 of 48 positions shown · non-contrast
Comparison: CT head 09/26/2017.

CLINICAL DATA: Transient aphasia in the setting of new onset atrial
fibrillation. Suspected transient ischemic attack.

EXAM:
MRI HEAD WITHOUT CONTRAST
MRA HEAD WITHOUT CONTRAST
TECHNIQUE: Multiplanar, multiecho pulse sequences of the brain and surrounding
structures were obtained without intravenous contrast. Angiographic
images of the head were obtained using MRA technique without
contrast.

[Series 3: DWI · axial · 3.0mm · 1.09mm/px · z∈[-38,+95]mm · 9 of 92 slices shown (1 of 4)]
[im 1/92]
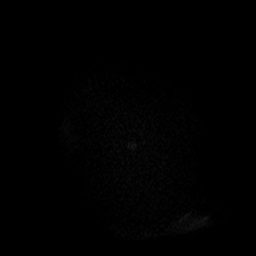
[im 12/92]
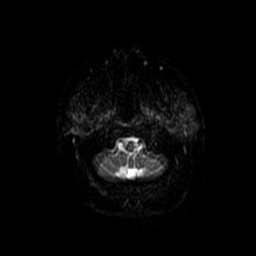
[im 23/92]
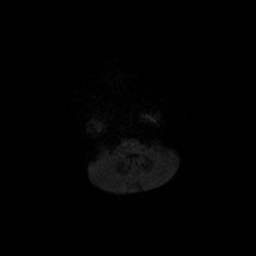
[im 35/92]
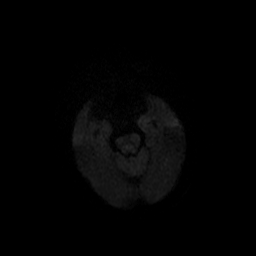
[im 46/92]
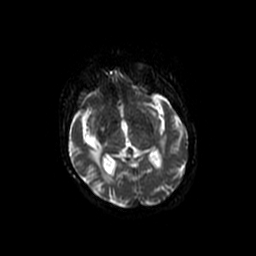
[im 57/92]
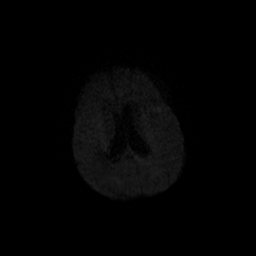
[im 69/92]
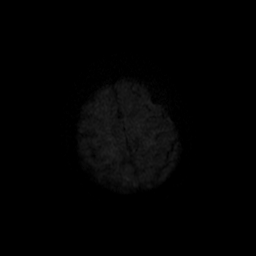
[im 80/92]
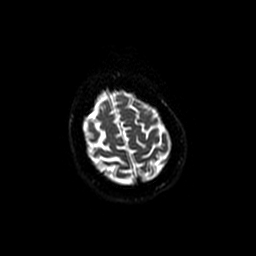
[im 92/92]
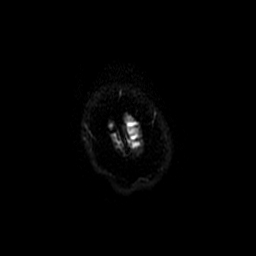

[Series 6: DWI · coronal · 5.0mm · 1.09mm/px · 7 of 72 slices shown (2 of 4)]
[im 1/72]
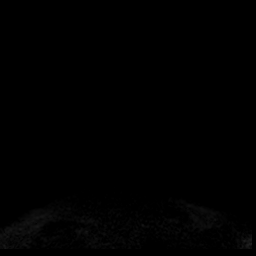
[im 12/72]
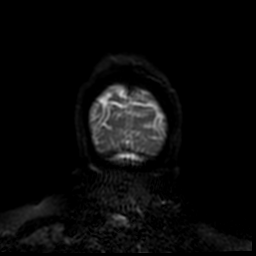
[im 24/72]
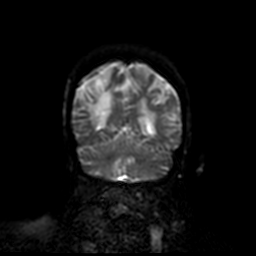
[im 36/72]
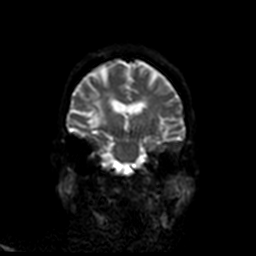
[im 48/72]
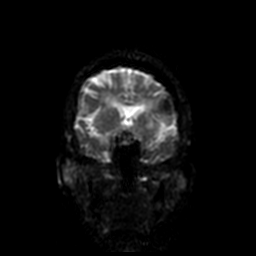
[im 60/72]
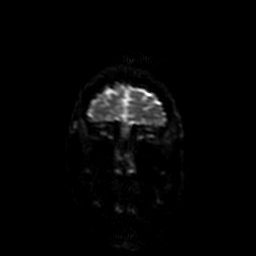
[im 72/72]
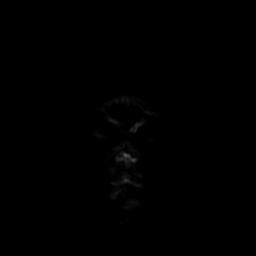

[Series 9: FLAIR · axial · 5.0mm · 0.43mm/px · z∈[-41,+93]mm · 3 of 24 slices shown]
[im 1/24]
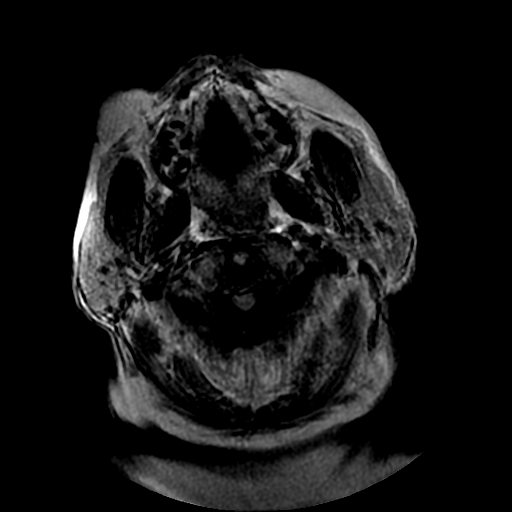
[im 12/24]
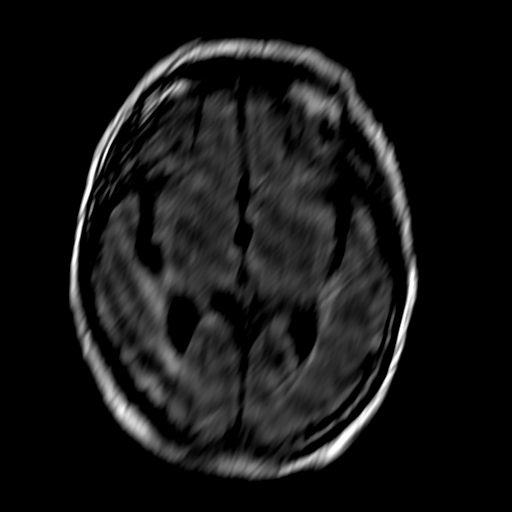
[im 24/24]
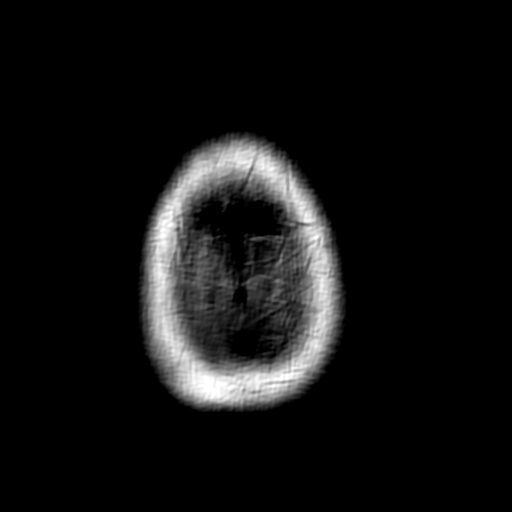

[Series 10: (id) mt fs · axial · 1.4mm · 0.43mm/px · z∈[-39,+14]mm · 5 of 136 slices shown]
[im 1/136]
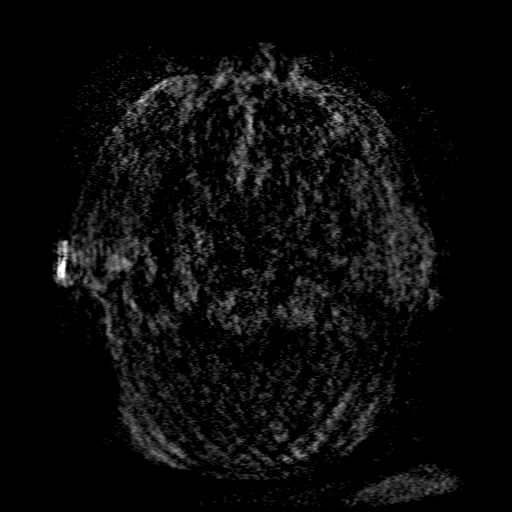
[im 20/136]
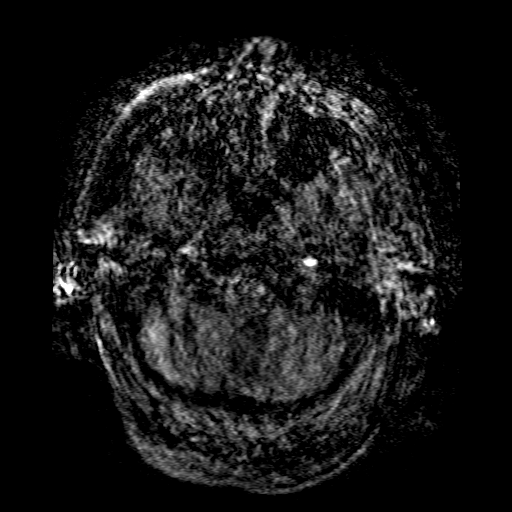
[im 39/136]
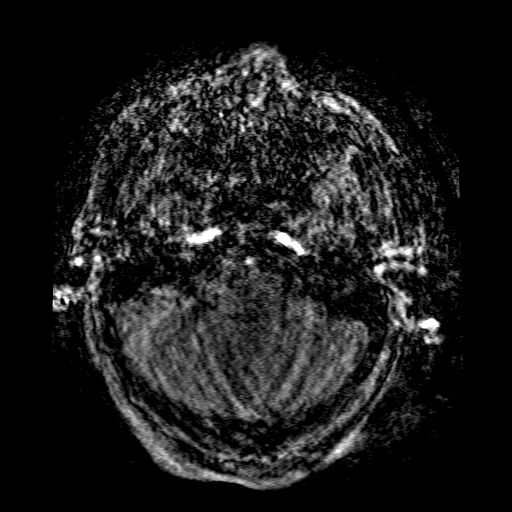
[im 58/136]
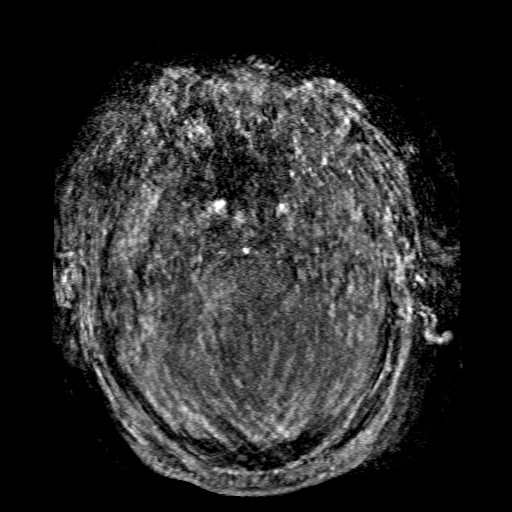
[im 78/136]
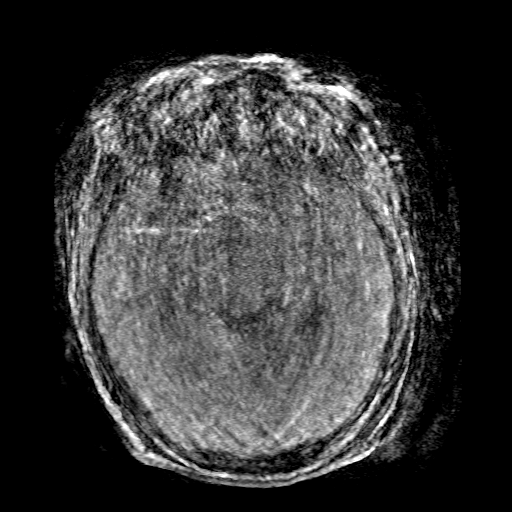

[Series 11: T2 · axial · 5.0mm · 0.43mm/px · z∈[-41,+93]mm · 3 of 24 slices shown]
[im 1/24]
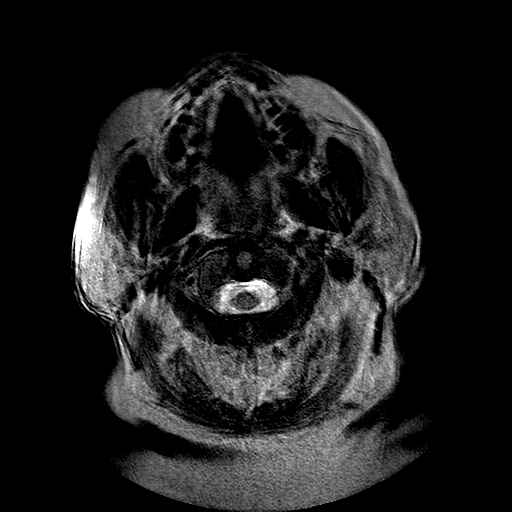
[im 12/24]
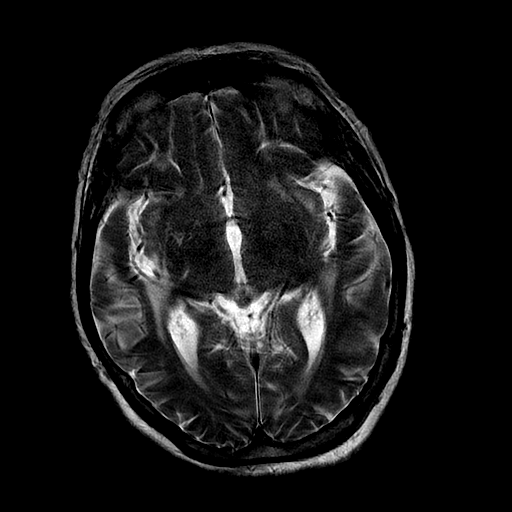
[im 24/24]
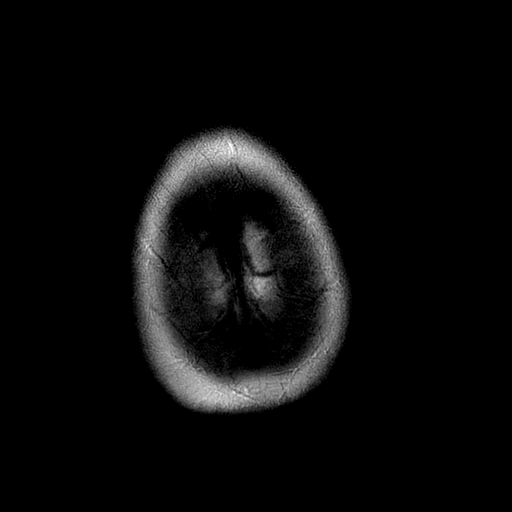

[Series 300: DWI · axial · 3.0mm · 1.09mm/px · z∈[-38,+95]mm · 5 of 46 slices shown (3 of 4)]
[im 1/46]
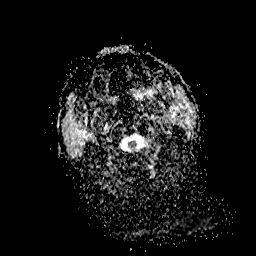
[im 12/46]
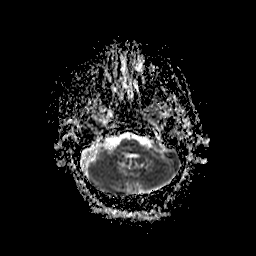
[im 23/46]
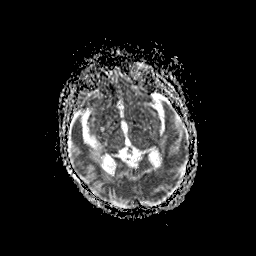
[im 34/46]
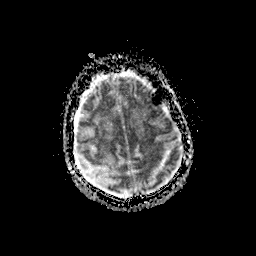
[im 46/46]
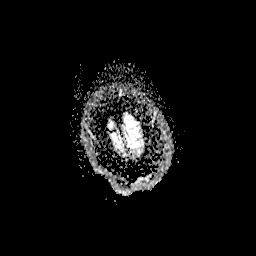

[Series 600: DWI · coronal · 5.0mm · 1.09mm/px · 4 of 36 slices shown (4 of 4)]
[im 1/36]
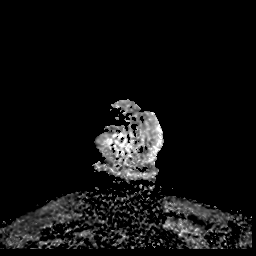
[im 12/36]
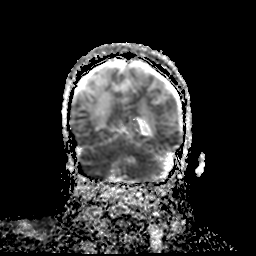
[im 24/36]
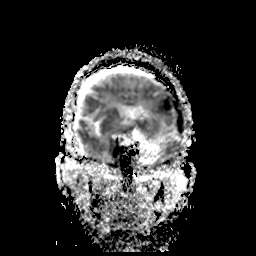
[im 36/36]
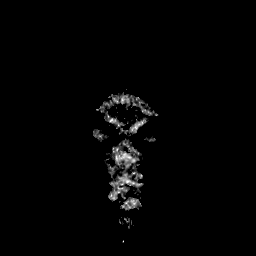

[36 of 48 positions shown; findings below may reference images not displayed]

FINDINGS: There is severe motion degradation. The study is of marginal
diagnostic utility.

MRI HEAD FINDINGS

Brain: No convincing areas of restricted diffusion are observed. Due
to motion degradation, there is insufficient correlation between the
axial and coronal DWI sequences and their corresponding ADC maps.

There is advanced atrophy with chronic microvascular ischemic
change. A chronic calcified or ossified 2 cm LEFT frontal meningioma
is redemonstrated, without adjacent edema.

Vascular: Flow voids are maintained.

Skull and upper cervical spine: Not assessed.

Sinuses/Orbits: No visible layering fluid. BILATERAL cataract
extraction.

Other: None.

MRA HEAD FINDINGS

Patency of the internal carotid arteries is established. Patency of
the basilar artery is established with BILATERAL vertebral
contribution, greater on the LEFT. Both middle cerebral arteries
demonstrate flow related enhancement.
IMPRESSION: Severely motion degraded MRI brain exam, of marginal diagnostic
utility. No convincing areas of acute infarction are observed. See
discussion above.

Severe motion degradation of the MRA sequences as well. Gross
patency of the carotid, vertebral, and basilar arteries is
established.

## 2019-10-27 ENCOUNTER — Other Ambulatory Visit: Payer: Self-pay | Admitting: Oncology

## 2019-10-28 ENCOUNTER — Other Ambulatory Visit: Payer: Self-pay | Admitting: Family Medicine

## 2019-11-05 ENCOUNTER — Encounter: Payer: Self-pay | Admitting: Podiatry

## 2019-11-05 ENCOUNTER — Other Ambulatory Visit: Payer: Self-pay | Admitting: Family Medicine

## 2019-11-05 ENCOUNTER — Ambulatory Visit (INDEPENDENT_AMBULATORY_CARE_PROVIDER_SITE_OTHER): Payer: Medicare Other | Admitting: Podiatry

## 2019-11-05 ENCOUNTER — Other Ambulatory Visit: Payer: Self-pay

## 2019-11-05 DIAGNOSIS — M2041 Other hammer toe(s) (acquired), right foot: Secondary | ICD-10-CM

## 2019-11-05 DIAGNOSIS — M79609 Pain in unspecified limb: Secondary | ICD-10-CM

## 2019-11-05 DIAGNOSIS — B351 Tinea unguium: Secondary | ICD-10-CM | POA: Diagnosis not present

## 2019-11-05 DIAGNOSIS — K219 Gastro-esophageal reflux disease without esophagitis: Secondary | ICD-10-CM

## 2019-11-05 NOTE — Progress Notes (Signed)
  Subjective:  Patient ID: Charlene Wade, female    DOB: [birth year],  MRN: [id number]  Chief Complaint  Patient presents with  . Nail Problem    trim my nails   . Callouses    5th toe on the right is sore and hurts    86 y.o. female presents with the above complaint. History confirmed with patient.   Objective:  Physical Exam:  Right Foot: pain right 5th toenail, right great toenail right hammertoe deformity with hyperkeratosis on the PIPJ. Assessment:   1. Hammertoe of right foot   2. Pain due to onychomycosis of nail      Plan:  Patient was evaluated and treated and all questions answered.  Hammertoe -Educated on etiology  -Discussed self-care of callus -Discussed possible surgery, not interested -Dispensed padding  Onychomycosis with pain -Nails debrided x2   Return if symptoms worsen or fail to improve.

## 2019-11-10 ENCOUNTER — Other Ambulatory Visit: Payer: Self-pay

## 2019-11-10 ENCOUNTER — Encounter (INDEPENDENT_AMBULATORY_CARE_PROVIDER_SITE_OTHER): Payer: Medicare Other | Admitting: Ophthalmology

## 2019-11-10 DIAGNOSIS — H353122 Nonexudative age-related macular degeneration, left eye, intermediate dry stage: Secondary | ICD-10-CM | POA: Diagnosis not present

## 2019-11-10 DIAGNOSIS — H43813 Vitreous degeneration, bilateral: Secondary | ICD-10-CM | POA: Diagnosis not present

## 2019-11-10 DIAGNOSIS — I10 Essential (primary) hypertension: Secondary | ICD-10-CM | POA: Diagnosis not present

## 2019-11-10 DIAGNOSIS — H35033 Hypertensive retinopathy, bilateral: Secondary | ICD-10-CM

## 2019-11-10 DIAGNOSIS — H348312 Tributary (branch) retinal vein occlusion, right eye, stable: Secondary | ICD-10-CM

## 2019-11-16 ENCOUNTER — Other Ambulatory Visit: Payer: Self-pay | Admitting: Family Medicine

## 2019-11-16 DIAGNOSIS — I10 Essential (primary) hypertension: Secondary | ICD-10-CM

## 2019-11-17 ENCOUNTER — Other Ambulatory Visit: Payer: Self-pay | Admitting: Family Medicine

## 2019-11-20 ENCOUNTER — Ambulatory Visit: Payer: Medicare Other

## 2019-11-20 ENCOUNTER — Ambulatory Visit: Payer: Medicare Other | Attending: Internal Medicine

## 2019-11-20 DIAGNOSIS — Z23 Encounter for immunization: Secondary | ICD-10-CM | POA: Insufficient documentation

## 2019-11-20 NOTE — Progress Notes (Signed)
   Covid-19 Vaccination Clinic  Name:  LILIYA FULLENWIDER    MRN: [id number] DOB: [birth year]  11/20/2019  Ms. Ruddell was observed post Covid-19 immunization for 15 minutes without incidence. She was provided with Vaccine Information Sheet and instruction to access the V-Safe system.   Ms. Lose was instructed to call 911 with any severe reactions post vaccine: Marland Kitchen Difficulty breathing  . Swelling of your face and throat  . A fast heartbeat  . A bad rash all over your body  . Dizziness and weakness    Immunizations Administered    Name Date Dose VIS Date Route   Pfizer COVID-19 Vaccine 11/20/2019  3:05 PM 0.3 mL 10/08/2019 Intramuscular   Manufacturer: [place]   Lot: [id number]   [place]: [address]-2

## 2019-12-02 ENCOUNTER — Other Ambulatory Visit: Payer: Self-pay | Admitting: *Deleted

## 2019-12-02 MED ORDER — APIXABAN 5 MG PO TABS: ORAL_TABLET | ORAL | 3 refills | Status: DC

## 2019-12-02 NOTE — Telephone Encounter (Signed)
Rx done. 

## 2019-12-12 ENCOUNTER — Ambulatory Visit: Payer: Medicare Other | Attending: Internal Medicine

## 2019-12-12 DIAGNOSIS — Z23 Encounter for immunization: Secondary | ICD-10-CM | POA: Insufficient documentation

## 2019-12-12 NOTE — Progress Notes (Signed)
   Covid-19 Vaccination Clinic  Name:  BRITTISH BOLINGER    MRN: [id number] DOB: 14-Apr-1928  12/12/2019  Ms. Weins was observed post Covid-19 immunization for 15 minutes without incidence. She was provided with Vaccine Information Sheet and instruction to access the V-Safe system.   Ms. Knezevic was instructed to call 911 with any severe reactions post vaccine: Marland Kitchen Difficulty breathing  . Swelling of your face and throat  . A fast heartbeat  . A bad rash all over your body  . Dizziness and weakness    Immunizations Administered    Name Date Dose VIS Date Route   Pfizer COVID-19 Vaccine 12/12/2019 11:53 AM 0.3 mL 10/08/2019 Intramuscular   Manufacturer: [place]   Lot: [id number]   [place]: [id number]

## 2019-12-13 ENCOUNTER — Encounter: Payer: Self-pay | Admitting: Family Medicine

## 2019-12-13 ENCOUNTER — Ambulatory Visit (INDEPENDENT_AMBULATORY_CARE_PROVIDER_SITE_OTHER): Payer: Medicare Other | Admitting: Family Medicine

## 2019-12-13 ENCOUNTER — Other Ambulatory Visit: Payer: Self-pay

## 2019-12-13 VITALS — BP 130/72 | HR 87 | Temp 97.9°F | Wt 147.7 lb

## 2019-12-13 DIAGNOSIS — R600 Localized edema: Secondary | ICD-10-CM

## 2019-12-13 DIAGNOSIS — L03116 Cellulitis of left lower limb: Secondary | ICD-10-CM | POA: Diagnosis not present

## 2019-12-13 MED ORDER — CEPHALEXIN 500 MG PO CAPS: 500.0000 mg | ORAL_CAPSULE | Freq: Three times a day (TID) | ORAL | 0 refills | Status: AC

## 2019-12-13 NOTE — Progress Notes (Signed)
   Subjective:    Patient ID: Charlene Wade, female    DOB: 06/02/1928, 86 y.o.   MRN: [id number]  HPI Here for 2 weeks of increased redness and warmth on the left leg. No pain in the legs, no fever. She was treated for cellulitis last year with Doxycycline and then with Keflex.    Review of Systems  Constitutional: Negative.   Respiratory: Negative.   Cardiovascular: Positive for leg swelling. Negative for chest pain and palpitations.  Skin: Positive for color change.       Objective:   Physical Exam Constitutional:      General: She is not in acute distress.    Comments: In her wheelchair  Cardiovascular:     Rate and Rhythm: Normal rate and regular rhythm.     Pulses: Normal pulses.     Heart sounds: Normal heart sounds.  Pulmonary:     Effort: Pulmonary effort is normal.     Breath sounds: Normal breath sounds.  Musculoskeletal:     Comments: Both lower legs have 3+ edema. Both are lightly red and both are slightly warm. Not tender   Neurological:     Mental Status: She is alert.           Assessment & Plan:  Cellulitis, treat with Cephalexin.  Alysia Penna, MD

## 2019-12-24 ENCOUNTER — Encounter: Payer: Self-pay | Admitting: Family Medicine

## 2019-12-24 ENCOUNTER — Telehealth: Payer: Self-pay | Admitting: Family Medicine

## 2019-12-24 NOTE — Telephone Encounter (Signed)
The patient wants someone to call her back today to ask questions about compression socks.  Please advise

## 2019-12-24 NOTE — Telephone Encounter (Signed)
Spoke with the pt and she asked if she could wear the compression hose all day and night.  I advised the pt generally these should be placed on in the morning and removed at night unless advised by a physician.  Patient stated she recalls someone telling her they could be worm day and night for weeks at a time and message was forwarded to PCP for recommendations.

## 2019-12-24 NOTE — Telephone Encounter (Signed)
Addressed through mychart

## 2020-01-05 ENCOUNTER — Telehealth: Payer: Self-pay | Admitting: Family Medicine

## 2020-01-05 NOTE — Telephone Encounter (Signed)
She doesn't need referral. Dr. Gwenlyn Found is her cardiologist. Last visit in 2019.

## 2020-01-05 NOTE — Telephone Encounter (Signed)
Spoke with caregiver and telephone number given to Dr Kennon Holter office.

## 2020-01-05 NOTE — Telephone Encounter (Signed)
Charlene Wade pt caretaker is requesting pt have a referral to a heart specialist due to her ongoing shortness of breath.    Charlene [phone]

## 2020-01-14 ENCOUNTER — Telehealth: Payer: Self-pay | Admitting: Cardiovascular Disease

## 2020-01-14 NOTE — Telephone Encounter (Signed)
Advised of policy-she is aware staff will be here to help patient in and out of office.  She verbalized understanding.

## 2020-01-14 NOTE — Telephone Encounter (Signed)
Patient's caregiver, Garnett Farm, states she needs to come with patient to her appt on 01/18/20 with Dr. Gwenlyn Found. States patient is in a wheelchair and will need assistance.

## 2020-01-18 ENCOUNTER — Encounter: Payer: Self-pay | Admitting: Cardiovascular Disease

## 2020-01-18 ENCOUNTER — Other Ambulatory Visit: Payer: Self-pay

## 2020-01-18 ENCOUNTER — Other Ambulatory Visit: Payer: Self-pay | Admitting: Family Medicine

## 2020-01-18 ENCOUNTER — Ambulatory Visit (INDEPENDENT_AMBULATORY_CARE_PROVIDER_SITE_OTHER): Payer: Medicare Other | Admitting: Cardiovascular Disease

## 2020-01-18 VITALS — BP 122/74 | HR 106 | Ht <= 58 in | Wt 139.2 lb

## 2020-01-18 DIAGNOSIS — I4811 Longstanding persistent atrial fibrillation: Secondary | ICD-10-CM | POA: Diagnosis not present

## 2020-01-18 DIAGNOSIS — E782 Mixed hyperlipidemia: Secondary | ICD-10-CM

## 2020-01-18 DIAGNOSIS — I10 Essential (primary) hypertension: Secondary | ICD-10-CM

## 2020-01-18 DIAGNOSIS — R609 Edema, unspecified: Secondary | ICD-10-CM | POA: Diagnosis not present

## 2020-01-18 MED ORDER — CARVEDILOL 6.25 MG PO TABS: 6.2500 mg | ORAL_TABLET | Freq: Two times a day (BID) | ORAL | 3 refills | Status: AC

## 2020-01-18 NOTE — Assessment & Plan Note (Signed)
Bilateral lower extremity edema consistent 2-3+ on as needed furosemide.  I have suggested that we go to daily furosemide however the patient is hesitant because of the effect on her urinary frequency.  2D echocardiogram performed 09/27/2017 revealed normal LV systolic function.

## 2020-01-18 NOTE — Assessment & Plan Note (Signed)
History of chronic A. fib with a slightly higher ventricular response at 106 today carvedilol 3.125 mg p.o. twice daily.  She also is on Eliquis oral anticoagulation.  I am going to increase her carvedilol to 6.25 mg p.o. twice daily for rate control.

## 2020-01-18 NOTE — Assessment & Plan Note (Signed)
History of essential hypertension with blood pressure measured today 122/74.  She is on carvedilol and diltiazem as well as losartan.

## 2020-01-18 NOTE — Patient Instructions (Signed)
Medication Instructions:  INCREASE CARVEDILOL TO 6.25 MG TWICE DAILY= 2 OF THE 3.125 MG TWICE DAILY  *If you need a refill on your cardiac medications before your next appointment, please call your pharmacy*   Lab Work: Your physician recommends that you HAVE LAB WORK TODAY  If you have labs (blood work) drawn today and your tests are completely normal, you will receive your results only by: Marland Kitchen MyChart Message (if you have MyChart) OR . A paper copy in the mail If you have any lab test that is abnormal or we need to change your treatment, we will call you to review the results.   Follow-Up: At [facility], you and your health needs are our priority.  As part of our continuing mission to provide you with exceptional heart care, we have created designated Provider Care Teams.  These Care Teams include your primary Cardiologist (physician) and Advanced Practice Providers (APPs -  Physician Assistants and Nurse Practitioners) who all work together to provide you with the care you need, when you need it.  We recommend signing up for the patient portal called "MyChart".  Sign up information is provided on this After Visit Summary.  MyChart is used to connect with patients for Virtual Visits (Telemedicine).  Patients are able to view lab/test results, encounter notes, upcoming appointments, etc.  Non-urgent messages can be sent to your provider as well.   To learn more about what you can do with MyChart, go to [email or web address].    Your next appointment:   12 month(s)  The format for your next appointment:   Either In Person or Virtual  Provider:   You may see Quay Burow MD or one of the following Advanced Practice Providers on your designated Care Team:    Kerin Ransom, PA-C  [place], [place]  Coletta Memos, [place]

## 2020-01-18 NOTE — Assessment & Plan Note (Signed)
History of hyperlipidemia on fenofibrate with lipid profile performed 09/27/2017 revealing total cholesterol 103, LDL 41 and HDL 37.

## 2020-01-18 NOTE — Progress Notes (Signed)
01/18/2020 Charlene Wade   Feb 20, 1928  [id number]  Primary Physician Caren Macadam, MD Primary Cardiologist: Lorretta Harp MD Charlene Wade, [place]  HPI:  Charlene Wade is a 86 y.o.  moderately overweight widowed Caucasian female mother of one living child, grandmother and 2 grandchildren referred by her primary care physician Dr. Maudie Wade and her orthopedist for cardiovascular evaluation because of lower extremity edema. I last saw her in the office 01/14/2018.  She is accompanied by her caregiver Charlene Wade.  She has a history of hypertension and hyperlipidemia. She did have a stroke 4 years ago and has left-sided weakness. She walks with a walker walker. She has had some dyspnea. She denies chest pain. She has never had a heart attack. She has chronic lower extremity edema probably contributed to by being on Norvasc She was recently hospitalized 09/26/17 for 3 days with new onset A. Fib with RVR. She was rate controlled and placed on Eliquis  oral anticoagulation. 2-D echo was normal. She is otherwise asymptomatic.  Since I saw her 2 years ago she continues to do well.  She does ambulate with a walker in her home.  She has some mild increasing shortness of breath and lower extreme edema.  She is prescribed as needed Lasix which she does not take because of its effect on her urinary frequency.   Current Meds  Medication Sig  . acetaminophen (TYLENOL) 500 MG tablet Take 500 mg by mouth 2 (two) times daily as needed for headache (pain).   Marland Kitchen albuterol (PROVENTIL HFA;VENTOLIN HFA) 108 (90 Base) MCG/ACT inhaler Inhale 2 puffs into the lungs every 6 (six) hours as needed.  Marland Kitchen apixaban (ELIQUIS) 5 MG TABS tablet TAKE 1 TABLET(5 MG) BY MOUTH TWICE DAILY  . benzonatate (TESSALON) 100 MG capsule TAKE 1 CAPSULE(100 MG) BY MOUTH THREE TIMES DAILY AS NEEDED  . calcitonin, salmon, (MIACALCIN/FORTICAL) 200 UNIT/ACT nasal spray   . carvedilol (COREG) 6.25 MG tablet Take 1 tablet (6.25 mg  total) by mouth 2 (two) times daily with a meal.  . cetirizine (ZYRTEC) 10 MG tablet Take 10 mg by mouth at bedtime.   . cholecalciferol (VITAMIN D) 1000 units tablet Take 2,000 Units by mouth 2 (two) times daily.  . Cyanocobalamin (VITAMIN B12 PO) Take 1 tablet by mouth daily.   Marland Kitchen denosumab (PROLIA) 60 MG/ML SOLN injection Inject 60 mg into the skin every 6 (six) months. Administer in upper arm, thigh, or abdomen (last injection June or July 2018)  . diclofenac sodium (VOLTAREN) 1 % GEL Apply 1 application topically 4 (four) times daily as needed (arm pain/bursitis).  Marland Kitchen diltiazem (CARDIZEM CD) 240 MG 24 hr capsule TAKE 1 CAPSULE(240 MG) BY MOUTH DAILY  . DULoxetine (CYMBALTA) 20 MG capsule TAKE 1 CAPSULE(20 MG) BY MOUTH DAILY  . fenofibrate (TRICOR) 145 MG tablet TAKE 1 TABLET(145 MG) BY MOUTH DAILY  . ferrous sulfate 325 (65 FE) MG EC tablet TAKE 1 TABLET(325 MG) BY MOUTH TWICE DAILY BEFORE A MEAL  . fluticasone (FLONASE) 50 MCG/ACT nasal spray INSTILL 1 SPRAY INTO EACH NOSTRIL TWICE DAILY  . furosemide (LASIX) 20 MG tablet TAKE 1 TABLET(20 MG) BY MOUTH DAILY AS NEEDED FOR SWELLING IN LEGS   . losartan (COZAAR) 50 MG tablet TAKE 1 TABLET(50 MG) BY MOUTH DAILY  . Multiple Vitamins-Minerals (PRESERVISION AREDS PO) Take 1 tablet by mouth 3 (three) times daily.   Marland Kitchen oxybutynin (DITROPAN-XL) 5 MG 24 hr tablet TAKE 1 TABLET BY MOUTH EVERY  DAY  . pantoprazole (PROTONIX) 20 MG tablet TAKE 1 TABLET(20 MG) BY MOUTH DAILY  . Polyethyl Glycol-Propyl Glycol (SYSTANE OP) Place 1 drop into both eyes 2 (two) times daily as needed (dry eyes).  . traMADol (ULTRAM) 50 MG tablet Take 1 tablet (50 mg total) by mouth every 6 (six) hours as needed.  . [DISCONTINUED] carvedilol (COREG) 3.125 MG tablet TAKE 1 TABLET(3.125 MG) BY MOUTH TWICE DAILY WITH A MEAL     Allergies  Allergen Reactions  . Levaquin [Levofloxacin] Other (See Comments)    Severe intermittent arm pain  . Ace Inhibitors Cough  . Codeine Other  (See Comments)    Unknown reaction - didn't feel well?  . Penicillins Other (See Comments)    Caused high fever Has patient had a PCN reaction causing immediate rash, facial/tongue/throat swelling, SOB or lightheadedness with hypotension: No Has patient had a PCN reaction causing severe rash involving mucus membranes or skin necrosis: No Has patient had a PCN reaction that required hospitalization: No Has patient had a PCN reaction occurring within the last 10 years: No If all of the above answers are "NO", then may proceed with Cephalosporin use.  . Sulfa Antibiotics Other (See Comments)    Possibly caused fever  . Sulfasalazine Other (See Comments)    Possibly caused fever    Social History   Socioeconomic History  . Marital status: Widowed    Spouse name: Not on file  . Number of children: 2  . Years of education: 99  . Highest education level: Not on file  Occupational History  . Occupation: retired  Tobacco Use  . Smoking status: Former Research scientist (life sciences)  . Smokeless tobacco: Never Used  Substance and Sexual Activity  . Alcohol use: No  . Drug use: No  . Sexual activity: Never  Other Topics Concern  . Not on file  Social History Narrative   Patient is single with 2 children.   Patient is right handed.   Patient has 12 th grade education.   Patient does not drink caffeine.   Social Determinants of Health   Financial Resource Strain:   . Difficulty of Paying Living Expenses:   Food Insecurity:   . Worried About Charity fundraiser in the Last Year:   . Arboriculturist in the Last Year:   Transportation Needs:   . Film/video editor (Medical):   Marland Kitchen Lack of Transportation (Non-Medical):   Physical Activity:   . Days of Exercise per Week:   . Minutes of Exercise per Session:   Stress:   . Feeling of Stress :   Social Connections:   . Frequency of Communication with Friends and Family:   . Frequency of Social Gatherings with Friends and Family:   . Attends Religious  Services:   . Active Member of Clubs or Organizations:   . Attends Archivist Meetings:   Marland Kitchen Marital Status:   Intimate Partner Violence:   . Fear of Current or Ex-Partner:   . Emotionally Abused:   Marland Kitchen Physically Abused:   . Sexually Abused:      Review of Systems: General: negative for chills, fever, night sweats or weight changes.  Cardiovascular: negative for chest pain, dyspnea on exertion, edema, orthopnea, palpitations, paroxysmal nocturnal dyspnea or shortness of breath Dermatological: negative for rash Respiratory: negative for cough or wheezing Urologic: negative for hematuria Abdominal: negative for nausea, vomiting, diarrhea, bright red blood per rectum, melena, or hematemesis Neurologic: negative for visual changes, syncope,  or dizziness All other systems reviewed and are otherwise negative except as noted above.    Blood pressure 122/74, pulse (!) 106, height 4\' 9"  (1.448 m), weight 139 lb 3.2 oz (63.1 kg), SpO2 90 %.  General appearance: alert and no distress Neck: no adenopathy, no carotid bruit, no JVD, supple, symmetrical, trachea midline and thyroid not enlarged, symmetric, no tenderness/mass/nodules Lungs: clear to auscultation bilaterally Heart: irregularly irregular rhythm Extremities: 2-3+ pitting edema bilaterally Pulses: 2+ and symmetric Skin: Skin color, texture, turgor normal. No rashes or lesions Neurologic: Alert and oriented X 3, normal strength and tone. Normal symmetric reflexes. Normal coordination and gait  EKG atrial fibrillation with ventricular spots of 106 poor R wave progression.  I personally reviewed this EKG.  ASSESSMENT AND PLAN:   Essential hypertension History of essential hypertension with blood pressure measured today 122/74.  She is on carvedilol and diltiazem as well as losartan.  Hyperlipidemia History of hyperlipidemia on fenofibrate with lipid profile performed 09/27/2017 revealing total cholesterol 103, LDL 41 and HDL  37.  Atrial fibrillation (HCC) History of chronic A. fib with a slightly higher ventricular response at 106 today carvedilol 3.125 mg p.o. twice daily.  She also is on Eliquis oral anticoagulation.  I am going to increase her carvedilol to 6.25 mg p.o. twice daily for rate control.  Edema Bilateral lower extremity edema consistent 2-3+ on as needed furosemide.  I have suggested that we go to daily furosemide however the patient is hesitant because of the effect on her urinary frequency.  2D echocardiogram performed 09/27/2017 revealed normal LV systolic function.      Lorretta Harp MD FACP,FACC,FAHA, [facility] 01/18/2020 3:46 PM

## 2020-01-19 LAB — BASIC METABOLIC PANEL
BUN/Creatinine Ratio: 16 (ref 12–28)
BUN: 18 mg/dL (ref 10–36)
CO2: 23 mmol/L (ref 20–29)
Calcium: 9.2 mg/dL (ref 8.7–10.3)
Chloride: 107 mmol/L — ABNORMAL HIGH (ref 96–106)
Creatinine, Ser: 1.13 mg/dL — ABNORMAL HIGH (ref 0.57–1.00)
GFR calc Af Amer: 49 mL/min/{1.73_m2} — ABNORMAL LOW (ref >59)
GFR calc non Af Amer: 43 mL/min/{1.73_m2} — ABNORMAL LOW (ref >59)
Glucose: 119 mg/dL — ABNORMAL HIGH (ref 65–99)
Potassium: 4.6 mmol/L (ref 3.5–5.2)
Sodium: 141 mmol/L (ref 134–144)

## 2020-01-26 ENCOUNTER — Encounter: Payer: Self-pay | Admitting: Family Medicine

## 2020-01-26 ENCOUNTER — Other Ambulatory Visit: Payer: Self-pay

## 2020-01-26 ENCOUNTER — Ambulatory Visit (INDEPENDENT_AMBULATORY_CARE_PROVIDER_SITE_OTHER): Payer: Medicare Other | Admitting: Family Medicine

## 2020-01-26 VITALS — BP 128/72 | HR 80 | Temp 97.7°F | Wt 138.0 lb

## 2020-01-26 DIAGNOSIS — R238 Other skin changes: Secondary | ICD-10-CM

## 2020-01-26 DIAGNOSIS — R609 Edema, unspecified: Secondary | ICD-10-CM | POA: Diagnosis not present

## 2020-01-26 DIAGNOSIS — I4811 Longstanding persistent atrial fibrillation: Secondary | ICD-10-CM

## 2020-01-26 NOTE — Progress Notes (Signed)
Subjective:     Patient ID: Charlene Wade, female   DOB: [birth year], 86 y.o.   MRN: [id number]  HPI Charlene Wade is seen today accompanied by her caregiver Sabas Sous)   she has had some progressive bilateral leg edema probably for several weeks now but has over the past few days noted some blisters and weeping edema from her legs.  She had recently seen cardiology.  She had basic metabolic panel and creatinine is actually improved at 1.13.  Her weight has been stable.  She had echocardiogram 2018 which showed normal ejection fraction.  She is on Lasix 20 mg daily but has been hesitating to take this because she does not like get up to urinate regularly.  Caregiver states that she has had poor appetite and does not eat a lot of proteins.  Her last albumin on record was 3.1 and this was over a year ago.  No history of diabetes.  Chronic problems include atrial fibrillation, history of CVA, hypertension, GERD, history of pituitary adenoma, osteoporosis, chronic kidney disease.  She is very sedentary.  Caregiver states she sits around with her legs dependent most of the day.  Does not use any compression  Past Medical History:  Diagnosis Date  . Allergic rhinitis   . Atrial fibrillation ([place]) 09/26/2017   New-onset A. Fib now persistent  . BCC (basal cell carcinoma of skin) 04/10/2015   sees dermatologist  . CKD (chronic kidney disease) stage 3, GFR 30-59 ml/min 04/26/2015  . Depression 10/17/2012  . Ectopic pregnancy   . GERD (gastroesophageal reflux disease)    hx hiatal hernia, has seen GI, ENT and allergist in the past  . Hyperlipidemia   . Hypertension   . OA (osteoarthritis)    knees and back, hx DDD, s/p remote lumbar disc surgery  . OAB (overactive bladder) 04/10/2015  . Obesity   . Osteoporosis    on prolia in the past  . Stroke [facility])    sees neurologist, hx memory loss, expressive aphasia  . Venous insufficiency    Past Surgical History:  Procedure Laterality Date  .  ABDOMINAL HYSTERECTOMY    . APPENDECTOMY    . BREAST REDUCTION SURGERY    . LAPAROSCOPIC SALPINGOOPHERECTOMY    . NASAL MASS EXCISION     Basal Cell Cancer Excision  . NASAL RECONSTRUCTION    . TEE WITHOUT CARDIOVERSION  10/22/2012   Procedure: TRANSESOPHAGEAL ECHOCARDIOGRAM (TEE);  Surgeon: Josue Hector, MD;  Location: [facility] ENDOSCOPY;  Service: Cardiovascular;  Laterality: N/A;    reports that she has quit smoking. She has never used smokeless tobacco. She reports that she does not drink alcohol or use drugs. family history includes Cancer - Lung in her brother; Hypertension in her father and mother. Allergies  Allergen Reactions  . Levaquin [Levofloxacin] Other (See Comments)    Severe intermittent arm pain  . Ace Inhibitors Cough  . Codeine Other (See Comments)    Unknown reaction - didn't feel well?  . Penicillins Other (See Comments)    Caused high fever Has patient had a PCN reaction causing immediate rash, facial/tongue/throat swelling, SOB or lightheadedness with hypotension: No Has patient had a PCN reaction causing severe rash involving mucus membranes or skin necrosis: No Has patient had a PCN reaction that required hospitalization: No Has patient had a PCN reaction occurring within the last 10 years: No If all of the above answers are "NO", then may proceed with Cephalosporin use.  . Sulfa Antibiotics Other (See  Comments)    Possibly caused fever  . Sulfasalazine Other (See Comments)    Possibly caused fever     Review of Systems  Constitutional: Negative for chills and unexpected weight change.  Respiratory: Negative for cough and shortness of breath.   Cardiovascular: Positive for leg swelling. Negative for chest pain and palpitations.  Gastrointestinal: Negative for abdominal pain.  Genitourinary: Negative for dysuria.  Neurological: Negative for dizziness and weakness.  Psychiatric/Behavioral: Negative for confusion.       Objective:   Physical  Exam Vitals reviewed.  Constitutional:      Appearance: Normal appearance.  Cardiovascular:     Rate and Rhythm: Normal rate.  Pulmonary:     Effort: Pulmonary effort is normal.     Breath sounds: Normal breath sounds.  Musculoskeletal:     Right lower leg: Edema present.     Left lower leg: Edema present.     Comments: He has 2+ to 3+ edema legs ankles feet bilaterally  Skin:    Comments: She has intact vesicle left medial ankle and lower leg region which is approximately 1 x 2 cm.  She has some weeping edema involving the right leg and a couple areas of superficial skin breakdown No cellulitis changes  Neurological:     Mental Status: She is alert.        Assessment:     #1 bilateral leg edema worsened over the past several weeks.  Likely multifactorial and related to poor nutrition, sedentary lifestyle, venous stasis.  She has a vesicle left lower leg and some early areas of skin breakdown and weeping edema right leg likely related to her edema  #2 atrial fibrillation treated with Eliquis and rate controlled on diltiazem    Plan:     -We recommended more frequent elevation of legs -We recommend trying to get up and move around more during the day if possible -Get back on daily furosemide 20 mg daily and if not seeing results in 3 or 4 days consider titration up to 40 mg for a few days -Discussed importance of good high quality proteins and gave some suggestions.  Consider nutrition supplement such as boost or Ensure -We discussed setting up home health referral because of her early skin breakdown on the left and right legs.  High risk for complications such as infection and poor healing -She may benefit from compression wraps from home health nurse -Recommend schedule follow-up with primary within the next couple of weeks to reassess  Eulas Post MD [facility]

## 2020-01-26 NOTE — Patient Instructions (Signed)
Protein Content in Foods  Generally, most healthy people need around 50 grams of protein each day. Depending on your overall health, you may need more or less protein in your diet. Talk to your health care provider or dietitian about how much protein you need. See the following list for the protein content of some common foods. High-protein foods High-protein foods contain 4 grams (4 g) or more of protein per serving. They include:  Beef, ground sirloin (cooked) -- 3 oz have 24 g of protein.  Cheese (hard) -- 1 oz has 7 g of protein.  Chicken breast, boneless and skinless (cooked) -- 3 oz have 13.4 g of protein.  Cottage cheese -- 1/2 cup has 13.4 g of protein.  Egg -- 1 egg has 6 g of protein.  Fish, filet (cooked) -- 1 oz has 6-7 g of protein.  Garbanzo beans (canned or cooked) -- 1/2 cup has 6-7 g of protein.  Kidney beans (canned or cooked) -- 1/2 cup has 6-7 g of protein.  Lamb (cooked) -- 3 oz has 24 g of protein.  Milk -- 1 cup (8 oz) has 8 g of protein.  Nuts (peanuts, pistachios, almonds) -- 1 oz has 6 g of protein.  Peanut butter -- 1 oz has 7-8 g of protein.  Pork tenderloin (cooked) -- 3 oz has 18.4 g of protein.  Pumpkin seeds -- 1 oz has 8.5 g of protein.  Soybeans (roasted) -- 1 oz has 8 g of protein.  Soybeans (cooked) -- 1/2 cup has 11 g of protein.  Soy milk -- 1 cup (8 oz) has 5-10 g of protein.  Soy or vegetable patty -- 1 patty has 11 g of protein.  Sunflower seeds -- 1 oz has 5.5 g of protein.  Tofu (firm) -- 1/2 cup has 20 g of protein.  Tuna (canned in water) -- 3 oz has 20 g of protein.  Yogurt -- 6 oz has 8 g of protein. Low-protein foods Low-protein foods contain 3 grams (3 g) or less of protein per serving. They include:  Beets (raw or cooked) -- 1/2 cup has 1.5 g of protein.  Bran cereal -- 1/2 cup has 2-3 g of protein.  Bread -- 1 slice has 2.5 g of protein.  Broccoli (raw or cooked) -- 1/2 cup has 2 g of protein.  Collard  greens (raw or cooked) -- 1/2 cup has 2 g of protein.  Corn (fresh or cooked) -- 1/2 cup has 2 g of protein.  Cream cheese -- 1 oz has 2 g of protein.  Creamer (half-and-half) -- 1 oz has 1 g of protein.  Flour tortilla -- 1 tortilla has 2.5 g of protein  Frozen yogurt -- 1/2 cup has 3 g of protein.  Fruit or vegetable juice -- 1/2 cup has 1 g of protein.  Green beans (raw or cooked) -- 1/2 cup has 1 g of protein.  Green peas (canned) -- 1/2 cup has 3.5 g of protein.  Muffins -- 1 small muffin (2 oz) has 3 g of protein.  Oatmeal (cooked) -- 1/2 cup has 3 g of protein.  Potato (baked with skin) -- 1 medium potato has 3 g of protein.  Rice (cooked) -- 1/2 cup has 2.5-3.5 g of protein.  Sour cream -- 1/2 cup has 2.5 g of protein.  Spinach (cooked) -- 1/2 cup has 3 g of protein.  Squash (cooked) -- 1/2 cup has 1.5 g of protein. Actual amounts of protein may  be different depending on processing. Talk with your health care provider or dietitian about what foods are recommended for you. This information is not intended to replace advice given to you by your health care provider. Make sure you discuss any questions you have with your health care provider. Document Revised: 06/24/2016 Document Reviewed: 06/24/2016 Elsevier Patient Education  [place] legs frequently during the day  Try to get up and move more during the day  Consider getting back on DAILY Furosemide  Increase protein rich foods- (see above) Also consider supplement such as Boost or Ensure for more protein.  Consider compression wraps.   If interested in home health for wound care.

## 2020-01-31 ENCOUNTER — Other Ambulatory Visit: Payer: Self-pay | Admitting: Family Medicine

## 2020-01-31 DIAGNOSIS — E785 Hyperlipidemia, unspecified: Secondary | ICD-10-CM | POA: Diagnosis not present

## 2020-01-31 DIAGNOSIS — I872 Venous insufficiency (chronic) (peripheral): Secondary | ICD-10-CM | POA: Diagnosis not present

## 2020-01-31 DIAGNOSIS — N183 Chronic kidney disease, stage 3 unspecified: Secondary | ICD-10-CM | POA: Diagnosis not present

## 2020-01-31 DIAGNOSIS — I69320 Aphasia following cerebral infarction: Secondary | ICD-10-CM | POA: Diagnosis not present

## 2020-01-31 DIAGNOSIS — I480 Paroxysmal atrial fibrillation: Secondary | ICD-10-CM | POA: Diagnosis not present

## 2020-01-31 DIAGNOSIS — I129 Hypertensive chronic kidney disease with stage 1 through stage 4 chronic kidney disease, or unspecified chronic kidney disease: Secondary | ICD-10-CM | POA: Diagnosis not present

## 2020-01-31 DIAGNOSIS — R238 Other skin changes: Secondary | ICD-10-CM | POA: Diagnosis not present

## 2020-01-31 DIAGNOSIS — I4811 Longstanding persistent atrial fibrillation: Secondary | ICD-10-CM | POA: Diagnosis not present

## 2020-01-31 DIAGNOSIS — Z4800 Encounter for change or removal of nonsurgical wound dressing: Secondary | ICD-10-CM | POA: Diagnosis not present

## 2020-01-31 DIAGNOSIS — I69354 Hemiplegia and hemiparesis following cerebral infarction affecting left non-dominant side: Secondary | ICD-10-CM | POA: Diagnosis not present

## 2020-01-31 DIAGNOSIS — M170 Bilateral primary osteoarthritis of knee: Secondary | ICD-10-CM | POA: Diagnosis not present

## 2020-01-31 DIAGNOSIS — Z87891 Personal history of nicotine dependence: Secondary | ICD-10-CM | POA: Diagnosis not present

## 2020-01-31 DIAGNOSIS — I69311 Memory deficit following cerebral infarction: Secondary | ICD-10-CM | POA: Diagnosis not present

## 2020-01-31 DIAGNOSIS — K219 Gastro-esophageal reflux disease without esophagitis: Secondary | ICD-10-CM | POA: Diagnosis not present

## 2020-02-01 ENCOUNTER — Telehealth: Payer: Self-pay | Admitting: Family Medicine

## 2020-02-01 NOTE — Telephone Encounter (Signed)
Charlene Wade from [facility] is needing verbal orders for 2w4 Nurse will be starting today with hydrosera blue and wrapping legs with perplex and coban.   Charlene Wade can be reached at [phone] -ok to leave a detailed message per Charlene Wade

## 2020-02-02 DIAGNOSIS — I4811 Longstanding persistent atrial fibrillation: Secondary | ICD-10-CM | POA: Diagnosis not present

## 2020-02-02 DIAGNOSIS — I872 Venous insufficiency (chronic) (peripheral): Secondary | ICD-10-CM | POA: Diagnosis not present

## 2020-02-02 DIAGNOSIS — R238 Other skin changes: Secondary | ICD-10-CM | POA: Diagnosis not present

## 2020-02-02 DIAGNOSIS — I129 Hypertensive chronic kidney disease with stage 1 through stage 4 chronic kidney disease, or unspecified chronic kidney disease: Secondary | ICD-10-CM | POA: Diagnosis not present

## 2020-02-02 DIAGNOSIS — N183 Chronic kidney disease, stage 3 unspecified: Secondary | ICD-10-CM | POA: Diagnosis not present

## 2020-02-02 DIAGNOSIS — Z4800 Encounter for change or removal of nonsurgical wound dressing: Secondary | ICD-10-CM | POA: Diagnosis not present

## 2020-02-02 NOTE — Telephone Encounter (Signed)
ok 

## 2020-02-02 NOTE — Telephone Encounter (Signed)
Spoke with Kelly and informed her of the approval for orders as below.  

## 2020-02-04 ENCOUNTER — Ambulatory Visit (INDEPENDENT_AMBULATORY_CARE_PROVIDER_SITE_OTHER): Payer: Medicare Other | Admitting: Podiatry

## 2020-02-04 ENCOUNTER — Other Ambulatory Visit: Payer: Self-pay

## 2020-02-04 DIAGNOSIS — D689 Coagulation defect, unspecified: Secondary | ICD-10-CM

## 2020-02-04 DIAGNOSIS — B351 Tinea unguium: Secondary | ICD-10-CM

## 2020-02-07 ENCOUNTER — Telehealth: Payer: Self-pay | Admitting: Family Medicine

## 2020-02-07 DIAGNOSIS — Z4800 Encounter for change or removal of nonsurgical wound dressing: Secondary | ICD-10-CM | POA: Diagnosis not present

## 2020-02-07 DIAGNOSIS — R238 Other skin changes: Secondary | ICD-10-CM | POA: Diagnosis not present

## 2020-02-07 DIAGNOSIS — I4811 Longstanding persistent atrial fibrillation: Secondary | ICD-10-CM | POA: Diagnosis not present

## 2020-02-07 DIAGNOSIS — I872 Venous insufficiency (chronic) (peripheral): Secondary | ICD-10-CM | POA: Diagnosis not present

## 2020-02-07 DIAGNOSIS — N183 Chronic kidney disease, stage 3 unspecified: Secondary | ICD-10-CM | POA: Diagnosis not present

## 2020-02-07 DIAGNOSIS — I129 Hypertensive chronic kidney disease with stage 1 through stage 4 chronic kidney disease, or unspecified chronic kidney disease: Secondary | ICD-10-CM | POA: Diagnosis not present

## 2020-02-07 NOTE — Telephone Encounter (Signed)
Spoke with the pts caregiver and informed her of the message below.  I offered an appt on 4/26 to arrive at 10:15am and Garnett Farm declined as she stated the pt will not get up that early.  Offered other days and she stated those would not be convenient as the pt has someone coming to the home to wrap her legs due to swelling and she requested a late appt as they were previously seen by Dr Elease Hashimoto at 4:45pm once.  Appt scheduled for 5/5 to arrive at 2:45pm for check in.

## 2020-02-07 NOTE — Telephone Encounter (Signed)
This is hard for me to answer without seeing the foot. I saw in past, and at that time I did not feel she needed to see podiatry, but she has had issues with infection and edema since that time, so things may have changed. Podiatry note is not yet in system to review, but the return visit is written for debridement; which means that there is a wound that may need to be addressed in order to maintain healthy healing. I am happy to take a look myself in the office if she would like perhaps in a couple of weeks to assess healing and determine future need for podiatry. If she would like to see a different podiatrist in followup then ok for referral.

## 2020-02-07 NOTE — Telephone Encounter (Signed)
Pt's caregiver, Garnett Farm, would like to speak to you regarding pt's podiatrist appt on last Friday. Per Garnett Farm, pt does not want to go back to [facility]. Per Garnett Farm, the physician made pt's toe bleed and put a bandage. Garnett Farm, would like to know if pt needs to continue to see a podiatrist? Thanks

## 2020-02-09 ENCOUNTER — Telehealth: Payer: Self-pay | Admitting: Internal Medicine

## 2020-02-09 NOTE — Telephone Encounter (Signed)
Pt is clear for prolia $0 copay No PA needed Set up for 02/15/20 Cannot be sooner than 02/02/20

## 2020-02-11 DIAGNOSIS — N183 Chronic kidney disease, stage 3 unspecified: Secondary | ICD-10-CM | POA: Diagnosis not present

## 2020-02-11 DIAGNOSIS — I4811 Longstanding persistent atrial fibrillation: Secondary | ICD-10-CM | POA: Diagnosis not present

## 2020-02-11 DIAGNOSIS — R238 Other skin changes: Secondary | ICD-10-CM | POA: Diagnosis not present

## 2020-02-11 DIAGNOSIS — I129 Hypertensive chronic kidney disease with stage 1 through stage 4 chronic kidney disease, or unspecified chronic kidney disease: Secondary | ICD-10-CM | POA: Diagnosis not present

## 2020-02-11 DIAGNOSIS — Z4800 Encounter for change or removal of nonsurgical wound dressing: Secondary | ICD-10-CM | POA: Diagnosis not present

## 2020-02-11 DIAGNOSIS — I872 Venous insufficiency (chronic) (peripheral): Secondary | ICD-10-CM | POA: Diagnosis not present

## 2020-02-12 ENCOUNTER — Other Ambulatory Visit: Payer: Self-pay | Admitting: Family Medicine

## 2020-02-12 DIAGNOSIS — I10 Essential (primary) hypertension: Secondary | ICD-10-CM

## 2020-02-14 DIAGNOSIS — I872 Venous insufficiency (chronic) (peripheral): Secondary | ICD-10-CM | POA: Diagnosis not present

## 2020-02-14 DIAGNOSIS — I4811 Longstanding persistent atrial fibrillation: Secondary | ICD-10-CM | POA: Diagnosis not present

## 2020-02-14 DIAGNOSIS — R238 Other skin changes: Secondary | ICD-10-CM | POA: Diagnosis not present

## 2020-02-14 DIAGNOSIS — I129 Hypertensive chronic kidney disease with stage 1 through stage 4 chronic kidney disease, or unspecified chronic kidney disease: Secondary | ICD-10-CM | POA: Diagnosis not present

## 2020-02-14 DIAGNOSIS — Z4800 Encounter for change or removal of nonsurgical wound dressing: Secondary | ICD-10-CM | POA: Diagnosis not present

## 2020-02-14 DIAGNOSIS — N183 Chronic kidney disease, stage 3 unspecified: Secondary | ICD-10-CM | POA: Diagnosis not present

## 2020-02-15 ENCOUNTER — Ambulatory Visit (INDEPENDENT_AMBULATORY_CARE_PROVIDER_SITE_OTHER): Payer: Medicare Other

## 2020-02-15 ENCOUNTER — Other Ambulatory Visit: Payer: Self-pay

## 2020-02-15 DIAGNOSIS — M810 Age-related osteoporosis without current pathological fracture: Secondary | ICD-10-CM | POA: Diagnosis not present

## 2020-02-15 MED ORDER — DENOSUMAB 60 MG/ML ~~LOC~~ SOSY
60.0000 mg | PREFILLED_SYRINGE | Freq: Once | SUBCUTANEOUS | Status: AC
  Administered 2020-02-15: 60 mg via SUBCUTANEOUS

## 2020-02-15 NOTE — Progress Notes (Signed)
Per orders of Dr. Gherghe injection of Prolia given today by Melissa, Certified Medical Assistant . Patient tolerated injection well.  

## 2020-02-15 NOTE — Progress Notes (Signed)
  Subjective:  Patient ID: Charlene Wade, female    DOB: [birth year],  MRN: [id number]  Chief Complaint  Patient presents with  . debride    BL nail trimming pt on cymbalta   86 y.o. female presents with the above complaint. History confirmed with patient. On eliquis  Objective:  Physical Exam: warm, good capillary refill, nail exam onychomycosis of the toenails, no trophic changes or ulcerative lesions. DP pulses palpable, PT pulses palpable and protective sensation absent Left Foot: normal exam, no swelling, tenderness, instability; ligaments intact, full range of motion of all ankle/foot joints  Right Foot: normal exam, no swelling, tenderness, instability; ligaments intact, full range of motion of all ankle/foot joints   No images are attached to the encounter.  Assessment:   1. Onychomycosis   2. Coagulation defect [facility])    Plan:  Patient was evaluated and treated and all questions answered.  Onychomycosis -Nails palliatively debrided secondary to pain and coag defect.   Procedure: Nail Debridement Rationale: Patient meets criteria for routine foot care due to coag defect Type of Debridement: manual, sharp debridement. Instrumentation: Nail nipper, rotary burr. Number of Nails: 10    Return in about 9 weeks (around 04/07/2020) for Diabetic Foot Care.

## 2020-02-18 DIAGNOSIS — N183 Chronic kidney disease, stage 3 unspecified: Secondary | ICD-10-CM | POA: Diagnosis not present

## 2020-02-18 DIAGNOSIS — Z4800 Encounter for change or removal of nonsurgical wound dressing: Secondary | ICD-10-CM | POA: Diagnosis not present

## 2020-02-18 DIAGNOSIS — I4811 Longstanding persistent atrial fibrillation: Secondary | ICD-10-CM | POA: Diagnosis not present

## 2020-02-18 DIAGNOSIS — I872 Venous insufficiency (chronic) (peripheral): Secondary | ICD-10-CM | POA: Diagnosis not present

## 2020-02-18 DIAGNOSIS — I129 Hypertensive chronic kidney disease with stage 1 through stage 4 chronic kidney disease, or unspecified chronic kidney disease: Secondary | ICD-10-CM | POA: Diagnosis not present

## 2020-02-18 DIAGNOSIS — R238 Other skin changes: Secondary | ICD-10-CM | POA: Diagnosis not present

## 2020-02-19 ENCOUNTER — Other Ambulatory Visit: Payer: Self-pay | Admitting: Oncology

## 2020-02-21 DIAGNOSIS — I872 Venous insufficiency (chronic) (peripheral): Secondary | ICD-10-CM | POA: Diagnosis not present

## 2020-02-21 DIAGNOSIS — Z4800 Encounter for change or removal of nonsurgical wound dressing: Secondary | ICD-10-CM | POA: Diagnosis not present

## 2020-02-21 DIAGNOSIS — R238 Other skin changes: Secondary | ICD-10-CM | POA: Diagnosis not present

## 2020-02-21 DIAGNOSIS — I129 Hypertensive chronic kidney disease with stage 1 through stage 4 chronic kidney disease, or unspecified chronic kidney disease: Secondary | ICD-10-CM | POA: Diagnosis not present

## 2020-02-21 DIAGNOSIS — I4811 Longstanding persistent atrial fibrillation: Secondary | ICD-10-CM | POA: Diagnosis not present

## 2020-02-21 DIAGNOSIS — N183 Chronic kidney disease, stage 3 unspecified: Secondary | ICD-10-CM | POA: Diagnosis not present

## 2020-02-25 DIAGNOSIS — R238 Other skin changes: Secondary | ICD-10-CM | POA: Diagnosis not present

## 2020-02-25 DIAGNOSIS — I4811 Longstanding persistent atrial fibrillation: Secondary | ICD-10-CM | POA: Diagnosis not present

## 2020-02-25 DIAGNOSIS — Z4800 Encounter for change or removal of nonsurgical wound dressing: Secondary | ICD-10-CM | POA: Diagnosis not present

## 2020-02-25 DIAGNOSIS — N183 Chronic kidney disease, stage 3 unspecified: Secondary | ICD-10-CM | POA: Diagnosis not present

## 2020-02-25 DIAGNOSIS — I129 Hypertensive chronic kidney disease with stage 1 through stage 4 chronic kidney disease, or unspecified chronic kidney disease: Secondary | ICD-10-CM | POA: Diagnosis not present

## 2020-02-25 DIAGNOSIS — I872 Venous insufficiency (chronic) (peripheral): Secondary | ICD-10-CM | POA: Diagnosis not present

## 2020-02-28 DIAGNOSIS — R238 Other skin changes: Secondary | ICD-10-CM | POA: Diagnosis not present

## 2020-02-28 DIAGNOSIS — Z4800 Encounter for change or removal of nonsurgical wound dressing: Secondary | ICD-10-CM | POA: Diagnosis not present

## 2020-02-28 DIAGNOSIS — I129 Hypertensive chronic kidney disease with stage 1 through stage 4 chronic kidney disease, or unspecified chronic kidney disease: Secondary | ICD-10-CM | POA: Diagnosis not present

## 2020-02-28 DIAGNOSIS — N183 Chronic kidney disease, stage 3 unspecified: Secondary | ICD-10-CM | POA: Diagnosis not present

## 2020-02-28 DIAGNOSIS — I4811 Longstanding persistent atrial fibrillation: Secondary | ICD-10-CM | POA: Diagnosis not present

## 2020-02-28 DIAGNOSIS — I872 Venous insufficiency (chronic) (peripheral): Secondary | ICD-10-CM | POA: Diagnosis not present

## 2020-03-01 ENCOUNTER — Telehealth: Payer: Self-pay | Admitting: *Deleted

## 2020-03-01 ENCOUNTER — Ambulatory Visit (INDEPENDENT_AMBULATORY_CARE_PROVIDER_SITE_OTHER): Payer: Medicare Other | Admitting: Family Medicine

## 2020-03-01 ENCOUNTER — Other Ambulatory Visit: Payer: Self-pay

## 2020-03-01 ENCOUNTER — Encounter: Payer: Self-pay | Admitting: Family Medicine

## 2020-03-01 VITALS — BP 140/60 | HR 91 | Temp 98.2°F | Ht <= 58 in

## 2020-03-01 DIAGNOSIS — E559 Vitamin D deficiency, unspecified: Secondary | ICD-10-CM

## 2020-03-01 DIAGNOSIS — L84 Corns and callosities: Secondary | ICD-10-CM | POA: Diagnosis not present

## 2020-03-01 DIAGNOSIS — M170 Bilateral primary osteoarthritis of knee: Secondary | ICD-10-CM | POA: Diagnosis not present

## 2020-03-01 DIAGNOSIS — I69320 Aphasia following cerebral infarction: Secondary | ICD-10-CM | POA: Diagnosis not present

## 2020-03-01 DIAGNOSIS — I4811 Longstanding persistent atrial fibrillation: Secondary | ICD-10-CM | POA: Diagnosis not present

## 2020-03-01 DIAGNOSIS — Z4800 Encounter for change or removal of nonsurgical wound dressing: Secondary | ICD-10-CM | POA: Diagnosis not present

## 2020-03-01 DIAGNOSIS — N183 Chronic kidney disease, stage 3 unspecified: Secondary | ICD-10-CM | POA: Diagnosis not present

## 2020-03-01 DIAGNOSIS — E782 Mixed hyperlipidemia: Secondary | ICD-10-CM | POA: Diagnosis not present

## 2020-03-01 DIAGNOSIS — I129 Hypertensive chronic kidney disease with stage 1 through stage 4 chronic kidney disease, or unspecified chronic kidney disease: Secondary | ICD-10-CM | POA: Diagnosis not present

## 2020-03-01 DIAGNOSIS — K219 Gastro-esophageal reflux disease without esophagitis: Secondary | ICD-10-CM | POA: Diagnosis not present

## 2020-03-01 DIAGNOSIS — I69354 Hemiplegia and hemiparesis following cerebral infarction affecting left non-dominant side: Secondary | ICD-10-CM | POA: Diagnosis not present

## 2020-03-01 DIAGNOSIS — D649 Anemia, unspecified: Secondary | ICD-10-CM | POA: Diagnosis not present

## 2020-03-01 DIAGNOSIS — E785 Hyperlipidemia, unspecified: Secondary | ICD-10-CM | POA: Diagnosis not present

## 2020-03-01 DIAGNOSIS — I872 Venous insufficiency (chronic) (peripheral): Secondary | ICD-10-CM | POA: Diagnosis not present

## 2020-03-01 DIAGNOSIS — I10 Essential (primary) hypertension: Secondary | ICD-10-CM | POA: Diagnosis not present

## 2020-03-01 DIAGNOSIS — M79674 Pain in right toe(s): Secondary | ICD-10-CM

## 2020-03-01 DIAGNOSIS — R238 Other skin changes: Secondary | ICD-10-CM | POA: Diagnosis not present

## 2020-03-01 DIAGNOSIS — E538 Deficiency of other specified B group vitamins: Secondary | ICD-10-CM

## 2020-03-01 DIAGNOSIS — G629 Polyneuropathy, unspecified: Secondary | ICD-10-CM

## 2020-03-01 DIAGNOSIS — I69311 Memory deficit following cerebral infarction: Secondary | ICD-10-CM | POA: Diagnosis not present

## 2020-03-01 DIAGNOSIS — I480 Paroxysmal atrial fibrillation: Secondary | ICD-10-CM | POA: Diagnosis not present

## 2020-03-01 DIAGNOSIS — Z87891 Personal history of nicotine dependence: Secondary | ICD-10-CM | POA: Diagnosis not present

## 2020-03-01 NOTE — Progress Notes (Signed)
Charlene Wade DOB: Jul 30, 1928 Encounter date: 03/01/2020  This is a 86 y.o. female who presents with Chief Complaint  Patient presents with  . patient presents to evaluate a corn on the right foot    History of present illness: Home health is coming twice a week. They don't want to go back to podiatry. Had some sores that are healing well. They will be back on Friday to check again and then coming every other week.   Others have suggested neuropathy might be issue. She does have some intermittent sharper pains.  She does have some discomfort that extends of the legs.  Does diurese with Lasix.  Does not take every day, but takes most days.  Occasionally will take 2 and does feel like she has better diuresis with this, but then uses restroom more than desired.      Allergies  Allergen Reactions  . Levaquin [Levofloxacin] Other (See Comments)    Severe intermittent arm pain  . Ace Inhibitors Cough  . Codeine Other (See Comments)    Unknown reaction - didn't feel well?  . Penicillins Other (See Comments)    Caused high fever Has patient had a PCN reaction causing immediate rash, facial/tongue/throat swelling, SOB or lightheadedness with hypotension: No Has patient had a PCN reaction causing severe rash involving mucus membranes or skin necrosis: No Has patient had a PCN reaction that required hospitalization: No Has patient had a PCN reaction occurring within the last 10 years: No If all of the above answers are "NO", then may proceed with Cephalosporin use.  . Sulfa Antibiotics Other (See Comments)    Possibly caused fever  . Sulfasalazine Other (See Comments)    Possibly caused fever   Current Meds  Medication Sig  . acetaminophen (TYLENOL) 500 MG tablet Take 500 mg by mouth 2 (two) times daily as needed for headache (pain).   Marland Kitchen albuterol (PROVENTIL HFA;VENTOLIN HFA) 108 (90 Base) MCG/ACT inhaler Inhale 2 puffs into the lungs every 6 (six) hours as needed.  Marland Kitchen apixaban  (ELIQUIS) 5 MG TABS tablet TAKE 1 TABLET(5 MG) BY MOUTH TWICE DAILY  . benzonatate (TESSALON) 100 MG capsule TAKE 1 CAPSULE(100 MG) BY MOUTH THREE TIMES DAILY AS NEEDED  . calcitonin, salmon, (MIACALCIN/FORTICAL) 200 UNIT/ACT nasal spray   . carvedilol (COREG) 6.25 MG tablet Take 1 tablet (6.25 mg total) by mouth 2 (two) times daily with a meal.  . cetirizine (ZYRTEC) 10 MG tablet Take 10 mg by mouth at bedtime.   . cholecalciferol (VITAMIN D) 1000 units tablet Take 2,000 Units by mouth 2 (two) times daily.  . Cyanocobalamin (VITAMIN B12 PO) Take 1 tablet by mouth daily.   Marland Kitchen denosumab (PROLIA) 60 MG/ML SOLN injection Inject 60 mg into the skin every 6 (six) months. Administer in upper arm, thigh, or abdomen (last injection June or July 2018)  . diclofenac sodium (VOLTAREN) 1 % GEL Apply 1 application topically 4 (four) times daily as needed (arm pain/bursitis).  Marland Kitchen diltiazem (CARDIZEM CD) 240 MG 24 hr capsule TAKE 1 CAPSULE(240 MG) BY MOUTH DAILY  . DULoxetine (CYMBALTA) 20 MG capsule TAKE 1 CAPSULE(20 MG) BY MOUTH DAILY  . fenofibrate (TRICOR) 145 MG tablet TAKE 1 TABLET(145 MG) BY MOUTH DAILY  . ferrous sulfate 325 (65 FE) MG EC tablet TAKE 1 TABLET(325 MG) BY MOUTH TWICE DAILY BEFORE A MEAL  . fluticasone (FLONASE) 50 MCG/ACT nasal spray INSTILL 1 SPRAY INTO EACH NOSTRIL TWICE DAILY  . furosemide (LASIX) 20 MG tablet TAKE 1  TABLET(20 MG) BY MOUTH DAILY AS NEEDED FOR SWELLING IN LEGS   . losartan (COZAAR) 50 MG tablet TAKE 1 TABLET(50 MG) BY MOUTH DAILY  . Multiple Vitamins-Minerals (PRESERVISION AREDS PO) Take 1 tablet by mouth 3 (three) times daily.   Marland Kitchen oxybutynin (DITROPAN-XL) 5 MG 24 hr tablet TAKE 1 TABLET BY MOUTH EVERY DAY  . pantoprazole (PROTONIX) 20 MG tablet TAKE 1 TABLET(20 MG) BY MOUTH DAILY  . Polyethyl Glycol-Propyl Glycol (SYSTANE OP) Place 1 drop into both eyes 2 (two) times daily as needed (dry eyes).  . traMADol (ULTRAM) 50 MG tablet Take 1 tablet (50 mg total) by mouth every  6 (six) hours as needed.    Review of Systems  Constitutional: Negative for chills, fatigue and fever.  Respiratory: Negative for cough, chest tightness, shortness of breath and wheezing.   Cardiovascular: Negative for chest pain, palpitations and leg swelling.  Musculoskeletal: Positive for gait problem (uses walker to get around at home, but generally sitting most of the day).  Skin: Positive for wound. Negative for color change.    Objective:  BP 140/60 (BP Location: Left Arm, Patient Position: Sitting, Cuff Size: Large)   Pulse 91   Temp 98.2 F (36.8 C) (Temporal)   Ht 4\' 9"  (1.448 m)   BMI 29.86 kg/m       BP Readings from Last 3 Encounters:  03/01/20 140/60  01/26/20 128/72  01/18/20 122/74   Wt Readings from Last 3 Encounters:  01/26/20 138 lb (62.6 kg)  01/18/20 139 lb 3.2 oz (63.1 kg)  12/13/19 147 lb 11.2 oz (67 kg)    Physical Exam Constitutional:      General: She is not in acute distress.    Appearance: She is well-developed.  Cardiovascular:     Rate and Rhythm: Normal rate and regular rhythm.     Pulses:          Dorsalis pedis pulses are 2+ on the right side and 2+ on the left side.       Posterior tibial pulses are 2+ on the right side and 2+ on the left side.     Heart sounds: Normal heart sounds. No murmur. No friction rub.     Comments: Left leg has compression stocking Right leg has had compression wrap, which has been effective as there is notable edema superior to end of wrap.  There is one anterior 0.75 cm superficial and well-healing anterior right lower leg. Pulmonary:     Effort: Pulmonary effort is normal. No respiratory distress.     Breath sounds: Normal breath sounds. No wheezing or rales.  Musculoskeletal:     Right lower leg: 1+ Pitting Edema present.     Left lower leg: 1+ Pitting Edema present.  Skin:    Comments: Right fifth digit dorsal/lateral aspect there is a corn over prominent joint (small hammertoe).  There is tenderness  with palpation of this callus.  Neurological:     Mental Status: She is alert and oriented to person, place, and time.  Psychiatric:        Behavior: Behavior normal.   Corn removal procedure note After patient consent, right fifth toe was cleansed with alcohol, and curette was used to pare down callus.  Patient tolerated this procedure well without pain.  Pain with palpation of this area resolved after paring.  Toe was wrapped with sterile gauze and triple antibiotic ointment.  Assessment/Plan  1. Corn of toe Patient had improvement in discomfort after lesion was pared  down.  I am not certain why she continues to get callus buildup in this area since she is not walking on a regular basis and does wear loose fitting shoes, we discussed continuing to have regular follow-up so that we can continue to pare this down for comfort.  2. Anemia, unspecified type We will recheck to make sure stable - CBC with Differential/Platelet; Future - IBC + Ferritin; Future - IBC + Ferritin - CBC with Differential/Platelet  3. Neuropathy We discussed that there are medications for neuropathy, but that they do, side effects.  Due to her age, I would be hesitant to start medication for neuropathy and less we would see that it would have a significant impact on her comfort level.  In the meanwhile, we have decided to get some additional labs and check for other factors that could contribute to neuropathy.  I would like to make sure all of these are corrected first before adding medication like gabapentin that has significant potential side effects.  4. Essential hypertension This is been stable.  Continue current medications. - CBC with Differential/Platelet; Future - Comprehensive metabolic panel; Future - Comprehensive metabolic panel - CBC with Differential/Platelet  5. B12 deficiency - Vitamin B12; Future - Vitamin B12  6. Vitamin D deficiency - VITAMIN D 25 Hydroxy (Vit-D Deficiency, Fractures);  Future - VITAMIN D 25 Hydroxy (Vit-D Deficiency, Fractures)  7. Pain of toe of right foot Uncertain why she is getting intermittent very sharp pains in the toe without walking.  Would like to make sure no other inflammatory causes. - Uric acid; Future - Uric acid  8. Mixed hyperlipidemia - TSH; Future - Lipid panel; Future - Lipid panel - TSH  Return for pending lab results.   Over 40 minutes spent in the office doing exam, corn paring, discussing lower extremity edema and necessary follow-up care/compression care.  Neuropathy discussion, and chart completion.  We discussed importance of leg elevation, and diuresis to keep edema level stable.   Micheline Rough, MD

## 2020-03-01 NOTE — Telephone Encounter (Signed)
Per Dr Ethlyn Gallery, I called Jinny Blossom with the home health agency at [phone] and left a detailed message stating it is OK to order juxtalite wraps for compression wrapping.

## 2020-03-02 LAB — LIPID PANEL
Cholesterol: 104 mg/dL (ref 0–200)
HDL: 37.8 mg/dL — ABNORMAL LOW (ref >39.00)
LDL Cholesterol: 46 mg/dL (ref 0–99)
NonHDL: 66.29
Total CHOL/HDL Ratio: 3
Triglycerides: 102 mg/dL (ref 0.0–149.0)
VLDL: 20.4 mg/dL (ref 0.0–40.0)

## 2020-03-02 LAB — CBC WITH DIFFERENTIAL/PLATELET
Basophils Absolute: 0.1 10*3/uL (ref 0.0–0.1)
Basophils Relative: 1.3 % (ref 0.0–3.0)
Eosinophils Absolute: 0.2 10*3/uL (ref 0.0–0.7)
Eosinophils Relative: 3.6 % (ref 0.0–5.0)
HCT: 27.5 % — ABNORMAL LOW (ref 36.0–46.0)
Hemoglobin: 8.8 g/dL — ABNORMAL LOW (ref 12.0–15.0)
Lymphocytes Relative: 18.8 % (ref 12.0–46.0)
Lymphs Abs: 1.1 10*3/uL (ref 0.7–4.0)
MCHC: 31.8 g/dL (ref 30.0–36.0)
MCV: 95.4 fl (ref 78.0–100.0)
Monocytes Absolute: 0.7 10*3/uL (ref 0.1–1.0)
Monocytes Relative: 11.8 % (ref 3.0–12.0)
Neutro Abs: 3.8 10*3/uL (ref 1.4–7.7)
Neutrophils Relative %: 64.5 % (ref 43.0–77.0)
Platelets: 302 10*3/uL (ref 150.0–400.0)
RBC: 2.88 Mil/uL — ABNORMAL LOW (ref 3.87–5.11)
RDW: 15.9 % — ABNORMAL HIGH (ref 11.5–15.5)
WBC: 5.9 10*3/uL (ref 4.0–10.5)

## 2020-03-02 LAB — COMPREHENSIVE METABOLIC PANEL
ALT: 7 U/L (ref 0–35)
AST: 27 U/L (ref 0–37)
Albumin: 3.5 g/dL (ref 3.5–5.2)
Alkaline Phosphatase: 42 U/L (ref 39–117)
BUN: 40 mg/dL — ABNORMAL HIGH (ref 6–23)
CO2: 24 mEq/L (ref 19–32)
Calcium: 8.5 mg/dL (ref 8.4–10.5)
Chloride: 110 mEq/L (ref 96–112)
Creatinine, Ser: 1.42 mg/dL — ABNORMAL HIGH (ref 0.40–1.20)
GFR: 34.74 mL/min — ABNORMAL LOW (ref >60.00)
Glucose, Bld: 126 mg/dL — ABNORMAL HIGH (ref 70–99)
Potassium: 5.4 mEq/L — ABNORMAL HIGH (ref 3.5–5.1)
Sodium: 138 mEq/L (ref 135–145)
Total Bilirubin: 0.4 mg/dL (ref 0.2–1.2)
Total Protein: 6.1 g/dL (ref 6.0–8.3)

## 2020-03-02 LAB — VITAMIN D 25 HYDROXY (VIT D DEFICIENCY, FRACTURES): VITD: 78.97 ng/mL (ref 30.00–100.00)

## 2020-03-02 LAB — URIC ACID: Uric Acid, Serum: 7.1 mg/dL — ABNORMAL HIGH (ref 2.4–7.0)

## 2020-03-02 LAB — VITAMIN B12: Vitamin B-12: 599 pg/mL (ref 211–911)

## 2020-03-02 LAB — TSH: TSH: 1.38 u[IU]/mL (ref 0.35–4.50)

## 2020-03-02 LAB — IBC + FERRITIN
Ferritin: 42 ng/mL (ref 10.0–291.0)
Iron: 52 ug/dL (ref 42–145)
Saturation Ratios: 10.4 % — ABNORMAL LOW (ref 20.0–50.0)
Transferrin: 356 mg/dL (ref 212.0–360.0)

## 2020-03-06 ENCOUNTER — Telehealth: Payer: Self-pay | Admitting: Family Medicine

## 2020-03-06 DIAGNOSIS — I872 Venous insufficiency (chronic) (peripheral): Secondary | ICD-10-CM | POA: Diagnosis not present

## 2020-03-06 DIAGNOSIS — R238 Other skin changes: Secondary | ICD-10-CM | POA: Diagnosis not present

## 2020-03-06 DIAGNOSIS — I4811 Longstanding persistent atrial fibrillation: Secondary | ICD-10-CM | POA: Diagnosis not present

## 2020-03-06 DIAGNOSIS — Z4800 Encounter for change or removal of nonsurgical wound dressing: Secondary | ICD-10-CM | POA: Diagnosis not present

## 2020-03-06 DIAGNOSIS — I129 Hypertensive chronic kidney disease with stage 1 through stage 4 chronic kidney disease, or unspecified chronic kidney disease: Secondary | ICD-10-CM | POA: Diagnosis not present

## 2020-03-06 DIAGNOSIS — N183 Chronic kidney disease, stage 3 unspecified: Secondary | ICD-10-CM | POA: Diagnosis not present

## 2020-03-06 NOTE — Telephone Encounter (Signed)
Okay 

## 2020-03-06 NOTE — Telephone Encounter (Signed)
Megan from [facility] needs verbal orders to increase frequency back to twice a week   Jinny Blossom can be reached at [phone] -ok to leave a detailed message per [facility]

## 2020-03-06 NOTE — Telephone Encounter (Signed)
Spoke with Jinny Blossom and informed her of the approval for verbal orders as below.

## 2020-03-08 ENCOUNTER — Other Ambulatory Visit: Payer: Self-pay | Admitting: Family Medicine

## 2020-03-08 MED ORDER — ALLOPURINOL 100 MG PO TABS: 100.0000 mg | ORAL_TABLET | Freq: Every day | ORAL | 1 refills | Status: DC

## 2020-03-08 NOTE — Progress Notes (Signed)
Med sent.

## 2020-03-10 DIAGNOSIS — Z4800 Encounter for change or removal of nonsurgical wound dressing: Secondary | ICD-10-CM | POA: Diagnosis not present

## 2020-03-10 DIAGNOSIS — N183 Chronic kidney disease, stage 3 unspecified: Secondary | ICD-10-CM | POA: Diagnosis not present

## 2020-03-10 DIAGNOSIS — R238 Other skin changes: Secondary | ICD-10-CM | POA: Diagnosis not present

## 2020-03-10 DIAGNOSIS — I129 Hypertensive chronic kidney disease with stage 1 through stage 4 chronic kidney disease, or unspecified chronic kidney disease: Secondary | ICD-10-CM | POA: Diagnosis not present

## 2020-03-10 DIAGNOSIS — I4811 Longstanding persistent atrial fibrillation: Secondary | ICD-10-CM | POA: Diagnosis not present

## 2020-03-10 DIAGNOSIS — I872 Venous insufficiency (chronic) (peripheral): Secondary | ICD-10-CM | POA: Diagnosis not present

## 2020-03-13 DIAGNOSIS — I4811 Longstanding persistent atrial fibrillation: Secondary | ICD-10-CM | POA: Diagnosis not present

## 2020-03-13 DIAGNOSIS — Z4800 Encounter for change or removal of nonsurgical wound dressing: Secondary | ICD-10-CM | POA: Diagnosis not present

## 2020-03-13 DIAGNOSIS — R238 Other skin changes: Secondary | ICD-10-CM | POA: Diagnosis not present

## 2020-03-13 DIAGNOSIS — N183 Chronic kidney disease, stage 3 unspecified: Secondary | ICD-10-CM | POA: Diagnosis not present

## 2020-03-13 DIAGNOSIS — I129 Hypertensive chronic kidney disease with stage 1 through stage 4 chronic kidney disease, or unspecified chronic kidney disease: Secondary | ICD-10-CM | POA: Diagnosis not present

## 2020-03-13 DIAGNOSIS — I872 Venous insufficiency (chronic) (peripheral): Secondary | ICD-10-CM | POA: Diagnosis not present

## 2020-03-20 ENCOUNTER — Other Ambulatory Visit: Payer: Self-pay | Admitting: Family Medicine

## 2020-03-20 DIAGNOSIS — I10 Essential (primary) hypertension: Secondary | ICD-10-CM

## 2020-03-24 ENCOUNTER — Other Ambulatory Visit: Payer: Self-pay | Admitting: Family Medicine

## 2020-03-24 DIAGNOSIS — R238 Other skin changes: Secondary | ICD-10-CM | POA: Diagnosis not present

## 2020-03-24 DIAGNOSIS — N183 Chronic kidney disease, stage 3 unspecified: Secondary | ICD-10-CM | POA: Diagnosis not present

## 2020-03-24 DIAGNOSIS — I4811 Longstanding persistent atrial fibrillation: Secondary | ICD-10-CM | POA: Diagnosis not present

## 2020-03-24 DIAGNOSIS — I129 Hypertensive chronic kidney disease with stage 1 through stage 4 chronic kidney disease, or unspecified chronic kidney disease: Secondary | ICD-10-CM | POA: Diagnosis not present

## 2020-03-24 DIAGNOSIS — I872 Venous insufficiency (chronic) (peripheral): Secondary | ICD-10-CM | POA: Diagnosis not present

## 2020-03-24 DIAGNOSIS — Z4800 Encounter for change or removal of nonsurgical wound dressing: Secondary | ICD-10-CM | POA: Diagnosis not present

## 2020-03-28 ENCOUNTER — Other Ambulatory Visit: Payer: Self-pay | Admitting: *Deleted

## 2020-03-28 ENCOUNTER — Other Ambulatory Visit (INDEPENDENT_AMBULATORY_CARE_PROVIDER_SITE_OTHER): Payer: Medicare Other

## 2020-03-28 ENCOUNTER — Other Ambulatory Visit: Payer: Self-pay

## 2020-03-28 DIAGNOSIS — R899 Unspecified abnormal finding in specimens from other organs, systems and tissues: Secondary | ICD-10-CM

## 2020-03-29 LAB — BASIC METABOLIC PANEL
BUN: 30 mg/dL — ABNORMAL HIGH (ref 6–23)
CO2: 26 mEq/L (ref 19–32)
Calcium: 8.4 mg/dL (ref 8.4–10.5)
Chloride: 106 mEq/L (ref 96–112)
Creatinine, Ser: 1.71 mg/dL — ABNORMAL HIGH (ref 0.40–1.20)
GFR: 28.03 mL/min — ABNORMAL LOW (ref >60.00)
Glucose, Bld: 136 mg/dL — ABNORMAL HIGH (ref 70–99)
Potassium: 4.3 mEq/L (ref 3.5–5.1)
Sodium: 137 mEq/L (ref 135–145)

## 2020-04-04 DIAGNOSIS — Z79891 Long term (current) use of opiate analgesic: Secondary | ICD-10-CM | POA: Diagnosis not present

## 2020-04-04 DIAGNOSIS — G894 Chronic pain syndrome: Secondary | ICD-10-CM | POA: Diagnosis not present

## 2020-04-07 ENCOUNTER — Ambulatory Visit: Payer: Medicare Other | Admitting: Podiatry

## 2020-04-22 ENCOUNTER — Other Ambulatory Visit: Payer: Self-pay | Admitting: Family Medicine

## 2020-04-24 ENCOUNTER — Other Ambulatory Visit: Payer: Self-pay | Admitting: Family Medicine

## 2020-04-26 ENCOUNTER — Other Ambulatory Visit: Payer: Self-pay | Admitting: Family Medicine

## 2020-04-26 DIAGNOSIS — K219 Gastro-esophageal reflux disease without esophagitis: Secondary | ICD-10-CM

## 2020-05-15 ENCOUNTER — Other Ambulatory Visit: Payer: Self-pay | Admitting: Family Medicine

## 2020-05-15 DIAGNOSIS — I10 Essential (primary) hypertension: Secondary | ICD-10-CM

## 2020-05-26 ENCOUNTER — Other Ambulatory Visit: Payer: Self-pay | Admitting: Family Medicine

## 2020-06-30 ENCOUNTER — Telehealth: Payer: Self-pay

## 2020-06-30 NOTE — Telephone Encounter (Signed)
Patient has been submitted to [place] for verification of benefits for Prolia.

## 2020-07-03 ENCOUNTER — Other Ambulatory Visit: Payer: Self-pay | Admitting: Oncology

## 2020-07-06 NOTE — Telephone Encounter (Signed)
Summary of benefits indicates that the patient does have coverage for Prolia. Plan does not require PA.  She has met her deductible of $203.00. Secondary insurance should cover anything not covered by Primary insurance.  Pt owes $0.  She can schedule on or after 08/17/20.

## 2020-07-10 ENCOUNTER — Other Ambulatory Visit: Payer: Self-pay

## 2020-07-10 ENCOUNTER — Emergency Department (HOSPITAL_COMMUNITY): Payer: Medicare Other

## 2020-07-10 ENCOUNTER — Encounter (HOSPITAL_COMMUNITY): Payer: Self-pay

## 2020-07-10 ENCOUNTER — Emergency Department (HOSPITAL_COMMUNITY)
Admission: EM | Admit: 2020-07-10 | Discharge: 2020-07-11 | Disposition: A | Discharge status: Home / Self Care | Payer: Medicare Other | Attending: Emergency Medicine | Admitting: Emergency Medicine

## 2020-07-10 DIAGNOSIS — H0100A Unspecified blepharitis right eye, upper and lower eyelids: Secondary | ICD-10-CM | POA: Diagnosis not present

## 2020-07-10 DIAGNOSIS — J90 Pleural effusion, not elsewhere classified: Secondary | ICD-10-CM | POA: Diagnosis not present

## 2020-07-10 DIAGNOSIS — H353213 Exudative age-related macular degeneration, right eye, with inactive scar: Secondary | ICD-10-CM | POA: Diagnosis not present

## 2020-07-10 DIAGNOSIS — H348312 Tributary (branch) retinal vein occlusion, right eye, stable: Secondary | ICD-10-CM | POA: Diagnosis not present

## 2020-07-10 DIAGNOSIS — Y9389 Activity, other specified: Secondary | ICD-10-CM | POA: Insufficient documentation

## 2020-07-10 DIAGNOSIS — S8012XA Contusion of left lower leg, initial encounter: Secondary | ICD-10-CM | POA: Diagnosis not present

## 2020-07-10 DIAGNOSIS — H04123 Dry eye syndrome of bilateral lacrimal glands: Secondary | ICD-10-CM | POA: Diagnosis not present

## 2020-07-10 DIAGNOSIS — S8992XA Unspecified injury of left lower leg, initial encounter: Secondary | ICD-10-CM | POA: Diagnosis present

## 2020-07-10 DIAGNOSIS — R0602 Shortness of breath: Secondary | ICD-10-CM | POA: Insufficient documentation

## 2020-07-10 DIAGNOSIS — X58XXXA Exposure to other specified factors, initial encounter: Secondary | ICD-10-CM | POA: Insufficient documentation

## 2020-07-10 DIAGNOSIS — Z961 Presence of intraocular lens: Secondary | ICD-10-CM | POA: Diagnosis not present

## 2020-07-10 DIAGNOSIS — Y9289 Other specified places as the place of occurrence of the external cause: Secondary | ICD-10-CM | POA: Diagnosis not present

## 2020-07-10 DIAGNOSIS — Y998 Other external cause status: Secondary | ICD-10-CM | POA: Diagnosis not present

## 2020-07-10 DIAGNOSIS — M7989 Other specified soft tissue disorders: Secondary | ICD-10-CM | POA: Insufficient documentation

## 2020-07-10 DIAGNOSIS — Z5321 Procedure and treatment not carried out due to patient leaving prior to being seen by health care provider: Secondary | ICD-10-CM | POA: Diagnosis not present

## 2020-07-10 DIAGNOSIS — H353124 Nonexudative age-related macular degeneration, left eye, advanced atrophic with subfoveal involvement: Secondary | ICD-10-CM | POA: Diagnosis not present

## 2020-07-10 DIAGNOSIS — H524 Presbyopia: Secondary | ICD-10-CM | POA: Diagnosis not present

## 2020-07-10 NOTE — ED Notes (Signed)
Pt left without being seen after triage

## 2020-07-10 NOTE — ED Triage Notes (Signed)
Patient c/o SOB since yesterday. Patient also c/o bilateral leg swelling-L>R. Left lower leg with bruising.

## 2020-07-11 ENCOUNTER — Telehealth (INDEPENDENT_AMBULATORY_CARE_PROVIDER_SITE_OTHER): Payer: Medicare Other | Admitting: Family Medicine

## 2020-07-11 DIAGNOSIS — M79605 Pain in left leg: Secondary | ICD-10-CM | POA: Diagnosis not present

## 2020-07-11 DIAGNOSIS — L819 Disorder of pigmentation, unspecified: Secondary | ICD-10-CM

## 2020-07-11 NOTE — Progress Notes (Signed)
Virtual Visit via Video Note  I connected with Charlene Wade  on 07/11/20 at  4:50 PM EDT by a video enabled telemedicine application and verified that I am speaking with the correct person using two identifiers.  Location patient: home, [place] Location provider:work or home office Persons participating in the virtual visit: patient, provider, caregiver  I discussed the limitations of evaluation and management by telemedicine and the availability of in person appointments. The patient expressed understanding and agreed to proceed.   HPI:  Acute visit for bruise on the leg: -started about 1 month ago -patient thinks could have hit shin on a chair, caregiver does not recall injury -small bruise like lesion on R shin, ttp, denies increased swelling in legs, break in the skin, skin lesion, increasing redness, swelling or discomfort -also they reports she has been less and less mobile the last year - doesn't feel like moving and because she is 89 they do not force her to so she sits, over time has become increasingly out of shape and winded even with minimal activity - this has been going on for months -denies CP, SOB otherwise, fevers, malaise, cough (beyond baseline symptoms), palpitations, increased swelling -daughter reports chronic edema has actually been pretty good -caregiver to her to ER yesterday as she called for PCP visit and because they told the person she had SOB they sent her to the ER, they sat in the ER for over 6 hours without being seen and finally left as they were anxious they would get sick sitting there and her symptoms are chronic -they are interested in inperson follow up if I can assist them with this  ROS: See pertinent positives and negatives per HPI.  Past Medical History:  Diagnosis Date  . Allergic rhinitis   . Atrial fibrillation ([place]) 09/26/2017   New-onset A. Fib now persistent  . BCC (basal cell carcinoma of skin) 04/10/2015   sees dermatologist  . CKD (chronic kidney  disease) stage 3, GFR 30-59 ml/min 04/26/2015  . Depression 10/17/2012  . Ectopic pregnancy   . GERD (gastroesophageal reflux disease)    hx hiatal hernia, has seen GI, ENT and allergist in the past  . Hyperlipidemia   . Hypertension   . OA (osteoarthritis)    knees and back, hx DDD, s/p remote lumbar disc surgery  . OAB (overactive bladder) 04/10/2015  . Obesity   . Osteoporosis    on prolia in the past  . Stroke [facility])    sees neurologist, hx memory loss, expressive aphasia  . Venous insufficiency     Past Surgical History:  Procedure Laterality Date  . ABDOMINAL HYSTERECTOMY    . APPENDECTOMY    . BREAST REDUCTION SURGERY    . LAPAROSCOPIC SALPINGOOPHERECTOMY    . NASAL MASS EXCISION     Basal Cell Cancer Excision  . NASAL RECONSTRUCTION    . TEE WITHOUT CARDIOVERSION  10/22/2012   Procedure: TRANSESOPHAGEAL ECHOCARDIOGRAM (TEE);  Surgeon: Josue Hector, MD;  Location: [facility] ENDOSCOPY;  Service: Cardiovascular;  Laterality: N/A;    Family History  Problem Relation Age of Onset  . Hypertension Father   . Hypertension Mother   . Cancer - Lung Brother     SOCIAL HX: see hpi   Current Outpatient Medications:  .  acetaminophen (TYLENOL) 500 MG tablet, Take 500 mg by mouth 2 (two) times daily as needed for headache (pain). , Disp: , Rfl:  .  albuterol (PROVENTIL HFA;VENTOLIN HFA) 108 (90 Base) MCG/ACT inhaler, Inhale 2  puffs into the lungs every 6 (six) hours as needed., Disp: 1 Inhaler, Rfl: 0 .  allopurinol (ZYLOPRIM) 100 MG tablet, Take 1 tablet (100 mg total) by mouth daily., Disp: 90 tablet, Rfl: 1 .  benzonatate (TESSALON) 100 MG capsule, TAKE 1 CAPSULE(100 MG) BY MOUTH THREE TIMES DAILY AS NEEDED, Disp: 30 capsule, Rfl: 1 .  calcitonin, salmon, (MIACALCIN/FORTICAL) 200 UNIT/ACT nasal spray, , Disp: , Rfl: 0 .  carvedilol (COREG) 6.25 MG tablet, Take 1 tablet (6.25 mg total) by mouth 2 (two) times daily with a meal., Disp: 180 tablet, Rfl: 3 .  cetirizine (ZYRTEC) 10 MG  tablet, Take 10 mg by mouth at bedtime. , Disp: , Rfl:  .  cholecalciferol (VITAMIN D) 1000 units tablet, Take 2,000 Units by mouth 2 (two) times daily., Disp: , Rfl:  .  Cyanocobalamin (VITAMIN B12 PO), Take 1 tablet by mouth daily. , Disp: , Rfl:  .  denosumab (PROLIA) 60 MG/ML SOLN injection, Inject 60 mg into the skin every 6 (six) months. Administer in upper arm, thigh, or abdomen (last injection June or July 2018), Disp: , Rfl:  .  diclofenac sodium (VOLTAREN) 1 % GEL, Apply 1 application topically 4 (four) times daily as needed (arm pain/bursitis)., Disp: , Rfl:  .  diltiazem (CARDIZEM CD) 240 MG 24 hr capsule, TAKE 1 CAPSULE(240 MG) BY MOUTH DAILY, Disp: 30 capsule, Rfl: 3 .  DULoxetine (CYMBALTA) 20 MG capsule, TAKE 1 CAPSULE(20 MG) BY MOUTH DAILY, Disp: 90 capsule, Rfl: 1 .  ELIQUIS 5 MG TABS tablet, TAKE 1 TABLET(5 MG) BY MOUTH TWICE DAILY, Disp: 60 tablet, Rfl: 3 .  fenofibrate (TRICOR) 145 MG tablet, TAKE 1 TABLET(145 MG) BY MOUTH DAILY, Disp: 90 tablet, Rfl: 1 .  ferrous sulfate 325 (65 FE) MG EC tablet, TAKE 1 TABLET(325 MG) BY MOUTH TWICE DAILY BEFORE A MEAL, Disp: 60 tablet, Rfl: 3 .  fluticasone (FLONASE) 50 MCG/ACT nasal spray, INSTILL 1 SPRAY INTO EACH NOSTRIL TWICE DAILY, Disp: 48 g, Rfl: 4 .  fluticasone (FLOVENT HFA) 44 MCG/ACT inhaler, Inhale 2 puffs into the lungs 2 (two) times daily for 14 days., Disp: 1 Inhaler, Rfl: 0 .  furosemide (LASIX) 20 MG tablet, TAKE 1 TABLET(20 MG) BY MOUTH DAILY AS NEEDED FOR SWELLING IN LEGS, Disp: 30 tablet, Rfl: 0 .  losartan (COZAAR) 50 MG tablet, TAKE 1 TABLET(50 MG) BY MOUTH DAILY, Disp: 90 tablet, Rfl: 0 .  Multiple Vitamins-Minerals (PRESERVISION AREDS PO), Take 1 tablet by mouth 3 (three) times daily. , Disp: , Rfl:  .  oxybutynin (DITROPAN-XL) 5 MG 24 hr tablet, TAKE 1 TABLET BY MOUTH EVERY DAY, Disp: 90 tablet, Rfl: 1 .  pantoprazole (PROTONIX) 20 MG tablet, TAKE 1 TABLET(20 MG) BY MOUTH DAILY, Disp: 90 tablet, Rfl: 0 .  Polyethyl  Glycol-Propyl Glycol (SYSTANE OP), Place 1 drop into both eyes 2 (two) times daily as needed (dry eyes)., Disp: , Rfl:  .  traMADol (ULTRAM) 50 MG tablet, Take 1 tablet (50 mg total) by mouth every 6 (six) hours as needed., Disp: 30 tablet, Rfl: 0  EXAM:  VITALS per patient if applicable: denies fevers  GENERAL: alert, oriented, appears well and in no acute distress  HEENT: atraumatic, conjunttiva clear, no obvious abnormalities on inspection of external nose and ears  NECK: normal movements of the head and neck  LUNGS: on inspection no signs of respiratory distress, breathing rate appears normal, no obvious gross SOB, gasping or wheezing  CV: no obvious cyanosis  SKIN: on  video exam small area of purplish discoloration on the L shin approx the size of a plmb - no erythema, increased edema in the area, skin lesion or discharge, pt reports is TTP on self exam  MS: moves all visible extremities without noticeable abnormality  PSYCH/NEURO: pleasant and cooperative, no obvious depression or anxiety, speech and thought processing grossly intact  ASSESSMENT AND PLAN:  Discussed the following assessment and plan:  Pain of left anterior lower extremity  Hyperpigmentation  -we discussed possible serious and likely etiologies, options for evaluation and workup, limitations of telemedicine visit vs in person visit, treatment, treatment risks and precautions. Pt prefers to treat via telemedicine empirically rather then risking or undertaking an in person visit at this moment. Query bruise or hematoma that is taking some time to heal. Opted for trial ice pack bid for 10 minutes (wraped in towel). Will reach out to PCP office to see  If they can get her in for the inperson evaluation they are request in a week or two and to follow up to ensure this is resolving. Discussed possible chair exercises or ways to maintain conditioning, she does not seem interested in this. Advised to seek prompt follow  up telemedicine visit or in person care if worsening, new symptoms arise, or if is not improving with treatment.    I discussed the assessment and treatment plan with the patient. The patient was provided an opportunity to ask questions and all were answered. The patient agreed with the plan and demonstrated an understanding of the instructions.      Lucretia Kern, DO

## 2020-07-11 NOTE — ED Notes (Signed)
Pt eloped from waiting area. Called 3X.  

## 2020-07-12 NOTE — Telephone Encounter (Signed)
Contacted patient and spoke with her caregiver named Passenger transport manager. Paulino Rily is not on the pt's DPR, so she was advised that unfortunately, I cannot discuss anything with her. The patient has an appt in this office on 07/17/20, and it can be discussed with pt then regarding Prolia scheduling.  In addition, Paulino Rily was advised that if the patient would like to, she can complete a new DPR to add Cher to it as someone that can be spoken with when calling regarding sensitive health information.  Cher verbalized understanding and noted that the pt will be spoken to regarding her Prolia injection next week at her visit.

## 2020-07-13 ENCOUNTER — Other Ambulatory Visit: Payer: Self-pay | Admitting: Family Medicine

## 2020-07-13 DIAGNOSIS — I10 Essential (primary) hypertension: Secondary | ICD-10-CM

## 2020-07-17 ENCOUNTER — Other Ambulatory Visit: Payer: Self-pay

## 2020-07-17 ENCOUNTER — Encounter: Payer: Self-pay | Admitting: Internal Medicine

## 2020-07-17 ENCOUNTER — Ambulatory Visit (INDEPENDENT_AMBULATORY_CARE_PROVIDER_SITE_OTHER): Payer: Medicare Other | Admitting: Internal Medicine

## 2020-07-17 VITALS — BP 118/70 | HR 80 | Ht <= 58 in | Wt 122.0 lb

## 2020-07-17 DIAGNOSIS — M810 Age-related osteoporosis without current pathological fracture: Secondary | ICD-10-CM | POA: Diagnosis not present

## 2020-07-17 DIAGNOSIS — E559 Vitamin D deficiency, unspecified: Secondary | ICD-10-CM | POA: Diagnosis not present

## 2020-07-17 NOTE — Progress Notes (Signed)
Patient ID: Charlene Wade, female   DOB: Feb 10, 1928, 86 y.o.   MRN: [id number]  This visit occurred during the SARS-CoV-2 public health emergency.  Safety protocols were in place, including screening questions prior to the visit, additional usage of staff PPE, and extensive cleaning of exam room while observing appropriate contact time as indicated for disinfecting solutions.   HPI  Charlene Wade is a 86 y.o.-year-old female,  Osteoporosis and vitamin D deficiency. Last visit 1 year ago.  She is here with her caregiver who offers part of the history mostly about Prolia doses, activity level, and other medical problems.  No falls or fractures since last visit.  She does feel unsure on her feet, though.  Reviewed and addended history: Pt was dx with osteoporosis in 2007.  She fell and had a R humerus fx (comminuted) in 11/2011.  Reviewed her DXA scan reports: 07/14/2019 ([place]) Lumbar spine L1-L4 (L3) Femoral neck (FN)  T-score  +0.6 RFN: -1.6 LFN: -1.5  Change in BMD from previous DXA test (%) -5.3*% +1.9%  (*) statistically significant  07/09/2017 ([place]) Lumbar spine L2-L4 (L1) Femoral neck (FN)  T-score +0.9 RFN: -1.7 LFN: -1.2  Change in BMD from previous DXA test (%) +7.8%* -2.1%  (*) statistically significant  06/28/2015 ([place]) Lumbar spine (L1-L4) Femoral neck (FN)  T-score  +0.4  RFN: -1.3 LFN: -1.3    08/01/2011 - [facility] ([place]):  L1-L3 (L4) T score: 0.0 RFN T score: -1.6 LFN T score: -1.7 FRAX score: MOF 12.6%, hip fracture risk 3.2%  05/28/2006 [facility]):  L1-L4 T score: -1.0 LFN T score: -1.3 1/3 distal forearm T score: -2.7 VFA was performed and was read as abnormal, however no details given in the report and the image quality is too poor for me to analyze.  Treatment: - Fosamax - for "many years"  - developed a cyst ~2011 years ago in the bone of the wisdom tooth >> extraction >> infection >>  was told she had a jaw fracture - Prolia - so far, she took it consistently since 2013 ([place] [facility] [employer]).  Received records from [place]: Prolia injections documented- no co-pay required:  She continues on Prolia: - 09/14/2014  - 03/03/2015  - 09/19/2015 - 03/19/2016  - 10/08/2016 - 04/16/2017 - 10/15/2017 ? - missed 1.5 years - 01/25/2019 - 08/03/2020 - 02/15/2020  She has a history of vitamin D deficiency but latest levels have been normal: Lab Results  Component Value Date   VD25OH 78.97 03/01/2020   VD25OH 60.98 06/28/2019   VD25OH 40.63 06/22/2018   VD25OH 31.94 06/09/2017   VD25OH 48.19 06/03/2016   VD25OH 34.96 10/02/2015   VD25OH 15.79 (L) 06/02/2015   She continues on 4000 units vitamin D daily.  No weightbearing exercises.  In fact, in the last year, she was less mobile.  No stairs in the home.  She has a caretaker at home 6 days a week, 8 hours a day.   She has a history of hypercalcemia.  She had a low parathyroid hormone in 2013.  No history of kidney stones. Lab Results  Component Value Date   PTH 8.3 (L) 10/13/2012   CALCIUM 8.4 03/28/2020   CALCIUM 8.5 03/01/2020   CALCIUM 9.2 01/18/2020   CALCIUM 9.3 06/28/2019   CALCIUM 8.4 (L) 02/26/2019   CALCIUM 7.6 (L) 02/03/2019   CALCIUM 9.1 07/21/2018   CALCIUM 9.5 06/16/2018   CALCIUM 8.6 11/04/2017  CALCIUM 8.6 (L) 09/28/2017   No history of thyrotoxicosis: Lab Results  Component Value Date   TSH 1.38 03/01/2020   TSH 0.438 09/27/2017   TSH 1.48 06/02/2015   + CKD.  Latest BUN/creatinine: Lab Results  Component Value Date   BUN 30 (H) 03/28/2020   CREATININE 1.71 (H) 03/28/2020   + FH of osteoporosis.  Her mother has osteoporosis and has had an arm fracture.  She also has a history of spinal stenosis - s/p surgery for ruptured disk.  She had spine injections in the past.  She has L hip bursitis.  ROS: Constitutional: no weight gain/no weight loss, no fatigue,  no subjective hyperthermia, no subjective hypothermia Eyes: no blurry vision, no xerophthalmia ENT: no sore throat, no nodules palpated in neck, no dysphagia, no odynophagia, no hoarseness Cardiovascular: no CP/+ SOB/no palpitations/no leg swelling Respiratory: no cough/+ SOB/no wheezing Gastrointestinal: no N/no V/no D/no C/no acid reflux Musculoskeletal: no muscle aches/+ joint aches Skin: no rashes, no hair loss Neurological: no tremors/no numbness/no tingling/no dizziness  I reviewed pt's medications, allergies, PMH, social hx, family hx, and changes were documented in the history of present illness. Otherwise, unchanged from my initial visit note.  Past Medical History:  Diagnosis Date  . Allergic rhinitis   . Atrial fibrillation ([place]) 09/26/2017   New-onset A. Fib now persistent  . BCC (basal cell carcinoma of skin) 04/10/2015   sees dermatologist  . CKD (chronic kidney disease) stage 3, GFR 30-59 ml/min 04/26/2015  . Depression 10/17/2012  . Ectopic pregnancy   . GERD (gastroesophageal reflux disease)    hx hiatal hernia, has seen GI, ENT and allergist in the past  . Hyperlipidemia   . Hypertension   . OA (osteoarthritis)    knees and back, hx DDD, s/p remote lumbar disc surgery  . OAB (overactive bladder) 04/10/2015  . Obesity   . Osteoporosis    on prolia in the past  . Stroke [facility])    sees neurologist, hx memory loss, expressive aphasia  . Venous insufficiency    Past Surgical History:  Procedure Laterality Date  . ABDOMINAL HYSTERECTOMY    . APPENDECTOMY    . BREAST REDUCTION SURGERY    . LAPAROSCOPIC SALPINGOOPHERECTOMY    . NASAL MASS EXCISION     Basal Cell Cancer Excision  . NASAL RECONSTRUCTION    . TEE WITHOUT CARDIOVERSION  10/22/2012   Procedure: TRANSESOPHAGEAL ECHOCARDIOGRAM (TEE);  Surgeon: Josue Hector, MD;  Location: [facility] ENDOSCOPY;  Service: Cardiovascular;  Laterality: N/A;   Social History   Socioeconomic History  . Marital status: Widowed     Spouse name: Not on file  . Number of children: 2  . Years of education: 6  . Highest education level: Not on file  Occupational History  . Occupation: retired  Tobacco Use  . Smoking status: Former Research scientist (life sciences)  . Smokeless tobacco: Never Used  Vaping Use  . Vaping Use: Never used  Substance and Sexual Activity  . Alcohol use: No  . Drug use: No  . Sexual activity: Never  Other Topics Concern  . Not on file  Social History Narrative   Patient is single with 2 children.   Patient is right handed.   Patient has 12 th grade education.   Patient does not drink caffeine.   Social Determinants of Health   Financial Resource Strain:   . Difficulty of Paying Living Expenses: Not on file  Food Insecurity:   . Worried About [employer] of  Food in the Last Year: Not on file  . Ran Out of Food in the Last Year: Not on file  Transportation Needs:   . Lack of Transportation (Medical): Not on file  . Lack of Transportation (Non-Medical): Not on file  Physical Activity:   . Days of Exercise per Week: Not on file  . Minutes of Exercise per Session: Not on file  Stress:   . Feeling of Stress : Not on file  Social Connections:   . Frequency of Communication with Friends and Family: Not on file  . Frequency of Social Gatherings with Friends and Family: Not on file  . Attends Religious Services: Not on file  . Active Member of Clubs or Organizations: Not on file  . Attends Archivist Meetings: Not on file  . Marital Status: Not on file  Intimate Partner Violence:   . Fear of Current or Ex-Partner: Not on file  . Emotionally Abused: Not on file  . Physically Abused: Not on file  . Sexually Abused: Not on file   Current Outpatient Medications on File Prior to Visit  Medication Sig Dispense Refill  . acetaminophen (TYLENOL) 500 MG tablet Take 500 mg by mouth 2 (two) times daily as needed for headache (pain).     Marland Kitchen albuterol (PROVENTIL HFA;VENTOLIN HFA) 108 (90 Base) MCG/ACT  inhaler Inhale 2 puffs into the lungs every 6 (six) hours as needed. 1 Inhaler 0  . allopurinol (ZYLOPRIM) 100 MG tablet Take 1 tablet (100 mg total) by mouth daily. 90 tablet 1  . benzonatate (TESSALON) 100 MG capsule TAKE 1 CAPSULE(100 MG) BY MOUTH THREE TIMES DAILY AS NEEDED 30 capsule 1  . calcitonin, salmon, (MIACALCIN/FORTICAL) 200 UNIT/ACT nasal spray   0  . carvedilol (COREG) 6.25 MG tablet Take 1 tablet (6.25 mg total) by mouth 2 (two) times daily with a meal. 180 tablet 3  . cetirizine (ZYRTEC) 10 MG tablet Take 10 mg by mouth at bedtime.     . cholecalciferol (VITAMIN D) 1000 units tablet Take 2,000 Units by mouth 2 (two) times daily.    . Cyanocobalamin (VITAMIN B12 PO) Take 1 tablet by mouth daily.     Marland Kitchen denosumab (PROLIA) 60 MG/ML SOLN injection Inject 60 mg into the skin every 6 (six) months. Administer in upper arm, thigh, or abdomen (last injection June or July 2018)    . diclofenac sodium (VOLTAREN) 1 % GEL Apply 1 application topically 4 (four) times daily as needed (arm pain/bursitis).    Marland Kitchen diltiazem (CARDIZEM CD) 240 MG 24 hr capsule TAKE 1 CAPSULE(240 MG) BY MOUTH DAILY 30 capsule 3  . DULoxetine (CYMBALTA) 20 MG capsule TAKE 1 CAPSULE(20 MG) BY MOUTH DAILY 90 capsule 1  . ELIQUIS 5 MG TABS tablet TAKE 1 TABLET(5 MG) BY MOUTH TWICE DAILY 60 tablet 3  . fenofibrate (TRICOR) 145 MG tablet TAKE 1 TABLET(145 MG) BY MOUTH DAILY 90 tablet 1  . ferrous sulfate 325 (65 FE) MG EC tablet TAKE 1 TABLET(325 MG) BY MOUTH TWICE DAILY BEFORE A MEAL 60 tablet 3  . fluticasone (FLONASE) 50 MCG/ACT nasal spray INSTILL 1 SPRAY INTO EACH NOSTRIL TWICE DAILY 48 g 4  . fluticasone (FLOVENT HFA) 44 MCG/ACT inhaler Inhale 2 puffs into the lungs 2 (two) times daily for 14 days. 1 Inhaler 0  . furosemide (LASIX) 20 MG tablet TAKE 1 TABLET(20 MG) BY MOUTH DAILY AS NEEDED FOR SWELLING IN LEGS 30 tablet 0  . losartan (COZAAR) 50 MG tablet TAKE 1  TABLET(50 MG) BY MOUTH DAILY 90 tablet 0  . Multiple  Vitamins-Minerals (PRESERVISION AREDS PO) Take 1 tablet by mouth 3 (three) times daily.     Marland Kitchen oxybutynin (DITROPAN-XL) 5 MG 24 hr tablet TAKE 1 TABLET BY MOUTH EVERY DAY 90 tablet 1  . pantoprazole (PROTONIX) 20 MG tablet TAKE 1 TABLET(20 MG) BY MOUTH DAILY 90 tablet 0  . Polyethyl Glycol-Propyl Glycol (SYSTANE OP) Place 1 drop into both eyes 2 (two) times daily as needed (dry eyes).    . traMADol (ULTRAM) 50 MG tablet Take 1 tablet (50 mg total) by mouth every 6 (six) hours as needed. 30 tablet 0   No current facility-administered medications on file prior to visit.   Allergies  Allergen Reactions  . Levaquin [Levofloxacin] Other (See Comments)    Severe intermittent arm pain  . Ace Inhibitors Cough  . Codeine Other (See Comments)    Unknown reaction - didn't feel well?  . Penicillins Other (See Comments)    Caused high fever Has patient had a PCN reaction causing immediate rash, facial/tongue/throat swelling, SOB or lightheadedness with hypotension: No Has patient had a PCN reaction causing severe rash involving mucus membranes or skin necrosis: No Has patient had a PCN reaction that required hospitalization: No Has patient had a PCN reaction occurring within the last 10 years: No If all of the above answers are "NO", then may proceed with Cephalosporin use.  . Sulfa Antibiotics Other (See Comments)    Possibly caused fever  . Sulfasalazine Other (See Comments)    Possibly caused fever   Family History  Problem Relation Age of Onset  . Hypertension Father   . Hypertension Mother   . Cancer - Lung Brother    PE: BP 118/70   Pulse 80   Ht 4\' 9"  (1.448 m)   Wt 122 lb (55.3 kg)   SpO2 97%   BMI 26.40 kg/m  Wt Readings from Last 3 Encounters:  07/17/20 122 lb (55.3 kg)  07/10/20 150 lb (68 kg)  01/26/20 138 lb (62.6 kg)   Constitutional: normal weight, in NAD, in wheelchair Eyes: PERRLA, EOMI, no exophthalmos ENT: moist mucous membranes, no thyromegaly, no cervical  lymphadenopathy Cardiovascular: + Irregularly irregular rhythm, RR, No RG, + 2/6 SEM, + pitting edema bilaterally Respiratory: + crackles over entire pulm. fields Gastrointestinal: abdomen soft, NT, ND, BS+ Musculoskeletal: + kyphosis, strength intact in all 4 Skin: moist, warm, no rashes Neurological: no tremor with outstretched hands, DTR normal in all 4  Assessment: 1. Osteoporosis  2. Vit D deficiency  3.  Weight loss  Plan: 1. Osteoporosis -Likely postmenopausal +/- secondary to steroids -She had a humeral fracture in 2013, no fractures since -We reviewed together the scan from 2020: Slightly better at the level of the spine and stable at the hips. -She continues Prolia and completed 6 years.  Latest injection was in 01/2019.  She missed injections in the past and we discussed that these absolutely needs to be staged every 6 months, otherwise, she will lose bone density and have fractures. I advised her that the next injection will be in 07/2020. -After Prolia, we cannot use bisphosphonates due to the decreased GFR.  We plan to continue Prolia for at least another year, but probably up to 10 years -No side effects from Prolia: No thigh pain, no jaw pain.  She continues to experience left hip pain related to bursitis.  No injections with steroids recently -No dental work ongoing -We will arrange for  new Prolia injection -I did advise her to do some form of weightbearing exercises but she tells me she cannot due to back pain. -We will check another bone density scan in 06/2021 -We will need to check a BMP at next visit with PCP.  She had normal calcium but low kidney function in 03/2020.  - RTC in 1 year  2. Vit D def -Continues on 4000 units vitamin D daily -Latest level was excellent in 03/2020, I do not feel we need to repeat this today -Recheck level at next annual physical exam with PCP  3.  Hypocalcemia -Latest calcium levels have been normal, but she had a low PTH in the  past -She also had few low calcium levels, possibly related to Prolia. -We will recheck this at next annual physical exam with PCP   Philemon Kingdom, MD PhD [facility] Endocrinology

## 2020-07-17 NOTE — Patient Instructions (Addendum)
Please come back for Prolia in 07/2020.  Please ask Dr. Ethlyn Gallery to check a vitamin D also at your next annual physical exam.  Please come back for a follow-up appointment in 1 year.

## 2020-07-28 ENCOUNTER — Other Ambulatory Visit: Payer: Self-pay | Admitting: Family Medicine

## 2020-07-28 DIAGNOSIS — K219 Gastro-esophageal reflux disease without esophagitis: Secondary | ICD-10-CM

## 2020-07-31 ENCOUNTER — Telehealth: Payer: Self-pay

## 2020-07-31 NOTE — Telephone Encounter (Signed)
Pt caregiver called to resched 08/17/20 prolia inj appt stating Thursday's is the only day she has off every week. Are we able to r/s to any other day of the week?

## 2020-07-31 NOTE — Telephone Encounter (Signed)
If patient is not able to make it that day, it should be fine to reschedule to a date she can make it. We don't  won't the patient to go to long without injection

## 2020-08-02 ENCOUNTER — Other Ambulatory Visit: Payer: Self-pay

## 2020-08-02 ENCOUNTER — Encounter: Payer: Self-pay | Admitting: Family Medicine

## 2020-08-02 ENCOUNTER — Ambulatory Visit (INDEPENDENT_AMBULATORY_CARE_PROVIDER_SITE_OTHER): Payer: Medicare Other | Admitting: Family Medicine

## 2020-08-02 VITALS — BP 122/60 | HR 98 | Temp 97.6°F | Ht <= 58 in | Wt 121.4 lb

## 2020-08-02 DIAGNOSIS — E790 Hyperuricemia without signs of inflammatory arthritis and tophaceous disease: Secondary | ICD-10-CM | POA: Diagnosis not present

## 2020-08-02 DIAGNOSIS — I10 Essential (primary) hypertension: Secondary | ICD-10-CM

## 2020-08-02 DIAGNOSIS — D649 Anemia, unspecified: Secondary | ICD-10-CM

## 2020-08-02 DIAGNOSIS — R0602 Shortness of breath: Secondary | ICD-10-CM | POA: Diagnosis not present

## 2020-08-02 DIAGNOSIS — M79674 Pain in right toe(s): Secondary | ICD-10-CM | POA: Diagnosis not present

## 2020-08-02 DIAGNOSIS — Z23 Encounter for immunization: Secondary | ICD-10-CM | POA: Diagnosis not present

## 2020-08-02 MED ORDER — ALBUTEROL SULFATE HFA 108 (90 BASE) MCG/ACT IN AERS: 2.0000 | INHALATION_SPRAY | Freq: Four times a day (QID) | RESPIRATORY_TRACT | 1 refills | Status: AC | PRN

## 2020-08-02 MED ORDER — AEROCHAMBER PLUS MISC: 0 refills | Status: AC

## 2020-08-02 NOTE — Progress Notes (Signed)
Charlene Wade DOB: [birth year] Encounter date: 08/02/2020  This is a 86 y.o. female who presents with Chief Complaint  Patient presents with  . Follow-up    History of present illness: Here for follow up from virtual visit. Sometimes tender. Doesn't feel like bruise is better. Right leg is more swollen than usual. Left is always swollen. She is wearing compression stockings regularly.  She does take her fluid pill regularly now.  She states that she will not take it if she is out for an appointment (like today, but otherwise when she is at home she does take it.  She does get frustrated with how much it makes her urinate and how frequently she has to go to the bathroom.  Was getting tramadol from pain management, but would like to go ahead and get this filled here now if able. Takes most days and usually takes later in the evening which helps with bursitis and sleeping. She is uncertain of dosing.   Fifth toe on the right foot still hurts.  Niece is here with her today states that "this is not a corn" because she does not walk or do anything that would put pressure on this toe.  It does seem to be extremely painful sometimes.  Sometimes it feels better after debridement.  Niece says that she "squeezes pus out of it" and has done this on numerous occasions.  It is very painful for the patient when this occurs.  It has been red, warm, infected in the past.  Currently just tender.  Niece states that it needs to be popped and drained and that "if you won't do it I will". Niece states that visit with podiatry was a waste of time and that they are never going back.     Allergies  Allergen Reactions  . Levaquin [Levofloxacin] Other (See Comments)    Severe intermittent arm pain  . Ace Inhibitors Cough  . Codeine Other (See Comments)    Unknown reaction - didn't feel well?  . Penicillins Other (See Comments)    Caused high fever Has patient had a PCN reaction causing immediate rash,  facial/tongue/throat swelling, SOB or lightheadedness with hypotension: No Has patient had a PCN reaction causing severe rash involving mucus membranes or skin necrosis: No Has patient had a PCN reaction that required hospitalization: No Has patient had a PCN reaction occurring within the last 10 years: No If all of the above answers are "NO", then may proceed with Cephalosporin use.  . Sulfa Antibiotics Other (See Comments)    Possibly caused fever  . Sulfasalazine Other (See Comments)    Possibly caused fever   Current Meds  Medication Sig  . acetaminophen (TYLENOL) 500 MG tablet Take 500 mg by mouth 2 (two) times daily as needed for headache (pain).   Marland Kitchen albuterol (VENTOLIN HFA) 108 (90 Base) MCG/ACT inhaler Inhale 2 puffs into the lungs every 6 (six) hours as needed.  Marland Kitchen allopurinol (ZYLOPRIM) 100 MG tablet Take 1 tablet (100 mg total) by mouth daily.  . benzonatate (TESSALON) 100 MG capsule TAKE 1 CAPSULE(100 MG) BY MOUTH THREE TIMES DAILY AS NEEDED  . calcitonin, salmon, (MIACALCIN/FORTICAL) 200 UNIT/ACT nasal spray   . carvedilol (COREG) 6.25 MG tablet Take 1 tablet (6.25 mg total) by mouth 2 (two) times daily with a meal.  . cetirizine (ZYRTEC) 10 MG tablet Take 10 mg by mouth at bedtime.   . cholecalciferol (VITAMIN D) 1000 units tablet Take 2,000 Units by mouth 2 (two)  times daily.  . Cyanocobalamin (VITAMIN B12 PO) Take 1 tablet by mouth daily.   Marland Kitchen denosumab (PROLIA) 60 MG/ML SOLN injection Inject 60 mg into the skin every 6 (six) months. Administer in upper arm, thigh, or abdomen (last injection June or July 2018)  . diclofenac sodium (VOLTAREN) 1 % GEL Apply 1 application topically 4 (four) times daily as needed (arm pain/bursitis).  Marland Kitchen diltiazem (CARDIZEM CD) 240 MG 24 hr capsule TAKE 1 CAPSULE(240 MG) BY MOUTH DAILY  . DULoxetine (CYMBALTA) 20 MG capsule TAKE 1 CAPSULE(20 MG) BY MOUTH DAILY  . ELIQUIS 5 MG TABS tablet TAKE 1 TABLET(5 MG) BY MOUTH TWICE DAILY  . fenofibrate  (TRICOR) 145 MG tablet TAKE 1 TABLET(145 MG) BY MOUTH DAILY  . ferrous sulfate 325 (65 FE) MG EC tablet TAKE 1 TABLET(325 MG) BY MOUTH TWICE DAILY BEFORE A MEAL  . fluticasone (FLONASE) 50 MCG/ACT nasal spray INSTILL 1 SPRAY INTO EACH NOSTRIL TWICE DAILY  . furosemide (LASIX) 20 MG tablet TAKE 1 TABLET(20 MG) BY MOUTH DAILY AS NEEDED FOR SWELLING IN LEGS  . losartan (COZAAR) 50 MG tablet TAKE 1 TABLET(50 MG) BY MOUTH DAILY  . Multiple Vitamins-Minerals (PRESERVISION AREDS PO) Take 1 tablet by mouth 3 (three) times daily.   Marland Kitchen oxybutynin (DITROPAN-XL) 5 MG 24 hr tablet TAKE 1 TABLET BY MOUTH EVERY DAY  . pantoprazole (PROTONIX) 20 MG tablet TAKE 1 TABLET(20 MG) BY MOUTH DAILY  . Polyethyl Glycol-Propyl Glycol (SYSTANE OP) Place 1 drop into both eyes 2 (two) times daily as needed (dry eyes).  . traMADol (ULTRAM) 50 MG tablet Take 1 tablet (50 mg total) by mouth every 6 (six) hours as needed.  . [DISCONTINUED] albuterol (PROVENTIL HFA;VENTOLIN HFA) 108 (90 Base) MCG/ACT inhaler Inhale 2 puffs into the lungs every 6 (six) hours as needed.    Review of Systems  Constitutional: Negative for chills, fatigue and fever. Appetite change: decreased slightly; taste just isn't as good.  Respiratory: Negative for cough, chest tightness, shortness of breath and wheezing.   Cardiovascular: Negative for chest pain, palpitations and leg swelling.  Musculoskeletal: Positive for arthralgias and back pain.  Skin:       5th toe right foot; see hpi   Neurological: Negative for dizziness and headaches.    Objective:  BP 122/60 (BP Location: Left Arm, Patient Position: Sitting, Cuff Size: Normal)   Pulse 98   Temp 97.6 F (36.4 C) (Oral)   Ht 4\' 9"  (1.448 m)   Wt 121 lb 6.4 oz (55.1 kg)   BMI 26.27 kg/m   Weight: 121 lb 6.4 oz (55.1 kg)   BP Readings from Last 3 Encounters:  08/02/20 122/60  07/17/20 118/70  07/10/20 129/60   Wt Readings from Last 3 Encounters:  08/02/20 121 lb 6.4 oz (55.1 kg)   07/17/20 122 lb (55.3 kg)  07/10/20 150 lb (68 kg)    Physical Exam Constitutional:      General: She is not in acute distress.    Appearance: She is well-developed.  Cardiovascular:     Rate and Rhythm: Normal rate and regular rhythm.     Pulses:          Dorsalis pedis pulses are 2+ on the right side and 2+ on the left side.     Heart sounds: Murmur heard.  Systolic murmur is present with a grade of 2/6.  No friction rub.  Pulmonary:     Effort: Pulmonary effort is normal. No respiratory distress.  Breath sounds: Decreased breath sounds present. No wheezing or rales.  Musculoskeletal:     Right lower leg: 2+ Edema present.     Left lower leg: 2+ Edema present.  Neurological:     Mental Status: She is alert and oriented to person, place, and time.  Psychiatric:        Behavior: Behavior normal.     Assessment/Plan  1. Elevated uric acid in blood She has been taking allopurinol.  I was hoping that this would help with toe pain, but it does not seem to make much difference.  We will plan to recheck uric acid for monitoring. - Uric acid; Future - Uric acid  2. Anemia, unspecified type History of anemia.  Recheck blood counts.  3. Essential hypertension Blood pressures been well controlled.  Continue with carvedilol 6.25 mg, Cardizem 240 mg, losartan 50 mg. - CBC with Differential/Platelet; Future - Comprehensive metabolic panel; Future - Comprehensive metabolic panel - CBC with Differential/Platelet  4. Toe pain, right Fifth toe right foot was cleansed with alcohol, and then using dermal curette, excess callus was shaved from the fifth digit.  Patient tolerated this well and seemed to have less pain than when done previously.  No fluctuance under this area, no purulent material relieved.  There is a central white discoloration, which makes toe appearance seem very in line with the corn given callus that is buildup around this area.  I have never seen this toe  draining, so uncertain what purulent material that niece is expressing from the toe.  None was relieved today.  If it is possible that she has gout and expression of material is secondary to this?  She does seem to get more comfort after shaving, so we can continue to monitor and shave as needed.  They do not wish to go back to podiatry.  5. Shortness of breath We are going to add in albuterol.  She is used this in the past for shortness of breath.  Let me know how she feels with this.  We discussed that deconditioning is also likely contributing.  It is good for her to be up and moving around during the day.  I do not feel this is secondary to fluid overload given lung exam.  If worsening, could consider follow-up with cardiology. - albuterol (VENTOLIN HFA) 108 (90 Base) MCG/ACT inhaler; Inhale 2 puffs into the lungs every 6 (six) hours as needed.  Dispense: 1 each; Refill: 1  6. Need for immunization against influenza - Flu Vaccine QUAD 6+ mos PF IM (Fluarix Quad PF)   Okay to refill tramadol if needed.  She is previously been getting this from pain doctor.  She uses regularly for her polyarthropathy.  Usually takes once daily and does get good relief with this. Return in about 3 months (around 11/02/2020) for Chronic condition visit.    Micheline Rough, MD

## 2020-08-03 LAB — COMPREHENSIVE METABOLIC PANEL
AG Ratio: 1.5 (calc) (ref 1.0–2.5)
ALT: 7 U/L (ref 6–29)
AST: 26 U/L (ref 10–35)
Albumin: 3.4 g/dL — ABNORMAL LOW (ref 3.6–5.1)
Alkaline phosphatase (APISO): 38 U/L (ref 37–153)
BUN/Creatinine Ratio: 14 (calc) (ref 6–22)
BUN: 27 mg/dL — ABNORMAL HIGH (ref 7–25)
CO2: 22 mmol/L (ref 20–32)
Calcium: 8.7 mg/dL (ref 8.6–10.4)
Chloride: 110 mmol/L (ref 98–110)
Creat: 1.87 mg/dL — ABNORMAL HIGH (ref 0.60–0.88)
Globulin: 2.3 g/dL (calc) (ref 1.9–3.7)
Glucose, Bld: 138 mg/dL — ABNORMAL HIGH (ref 65–99)
Potassium: 5 mmol/L (ref 3.5–5.3)
Sodium: 139 mmol/L (ref 135–146)
Total Bilirubin: 0.5 mg/dL (ref 0.2–1.2)
Total Protein: 5.7 g/dL — ABNORMAL LOW (ref 6.1–8.1)

## 2020-08-03 LAB — CBC WITH DIFFERENTIAL/PLATELET
Absolute Monocytes: 460 cells/uL (ref 200–950)
Basophils Absolute: 50 cells/uL (ref 0–200)
Basophils Relative: 1 %
Eosinophils Absolute: 190 cells/uL (ref 15–500)
Eosinophils Relative: 3.8 %
HCT: 26.4 % — ABNORMAL LOW (ref 35.0–45.0)
Hemoglobin: 8.2 g/dL — ABNORMAL LOW (ref 11.7–15.5)
Lymphs Abs: 970 cells/uL (ref 850–3900)
MCH: 29.5 pg (ref 27.0–33.0)
MCHC: 31.1 g/dL — ABNORMAL LOW (ref 32.0–36.0)
MCV: 95 fL (ref 80.0–100.0)
MPV: 10.8 fL (ref 7.5–12.5)
Monocytes Relative: 9.2 %
Neutro Abs: 3330 cells/uL (ref 1500–7800)
Neutrophils Relative %: 66.6 %
Platelets: 258 10*3/uL (ref 140–400)
RBC: 2.78 10*6/uL — ABNORMAL LOW (ref 3.80–5.10)
RDW: 13.9 % (ref 11.0–15.0)
Total Lymphocyte: 19.4 %
WBC: 5 10*3/uL (ref 3.8–10.8)

## 2020-08-03 LAB — URIC ACID: Uric Acid, Serum: 4.2 mg/dL (ref 2.5–7.0)

## 2020-08-12 ENCOUNTER — Other Ambulatory Visit: Payer: Self-pay | Admitting: Family Medicine

## 2020-08-12 DIAGNOSIS — I10 Essential (primary) hypertension: Secondary | ICD-10-CM

## 2020-08-17 ENCOUNTER — Ambulatory Visit: Payer: Medicare Other

## 2020-08-22 ENCOUNTER — Telehealth: Payer: Self-pay | Admitting: Family Medicine

## 2020-08-22 NOTE — Telephone Encounter (Signed)
Left message for patient to call back and schedule Medicare Annual Wellness Visit (AWV) either virtually or in office.  Last AWV 11/19/16; please schedule at anytime with [facility] Nurse Health Advisor 2.  This should be a 45 minute visit.

## 2020-08-23 ENCOUNTER — Other Ambulatory Visit: Payer: Self-pay

## 2020-08-23 ENCOUNTER — Ambulatory Visit (INDEPENDENT_AMBULATORY_CARE_PROVIDER_SITE_OTHER): Payer: Medicare Other

## 2020-08-23 DIAGNOSIS — M810 Age-related osteoporosis without current pathological fracture: Secondary | ICD-10-CM

## 2020-08-23 MED ORDER — DENOSUMAB 60 MG/ML ~~LOC~~ SOSY
60.0000 mg | PREFILLED_SYRINGE | Freq: Once | SUBCUTANEOUS | Status: AC
  Administered 2020-08-23: 60 mg via SUBCUTANEOUS

## 2020-08-23 NOTE — Progress Notes (Signed)
Patient given Prolia left arm ordered by Dr. Norva Riffle tolerated well

## 2020-08-28 ENCOUNTER — Other Ambulatory Visit: Payer: Self-pay | Admitting: Family Medicine

## 2020-08-29 ENCOUNTER — Inpatient Hospital Stay: Payer: Medicare Other | Attending: Oncology | Admitting: Oncology

## 2020-08-29 ENCOUNTER — Other Ambulatory Visit: Payer: Self-pay

## 2020-08-29 DIAGNOSIS — E669 Obesity, unspecified: Secondary | ICD-10-CM | POA: Diagnosis not present

## 2020-08-29 DIAGNOSIS — Z79899 Other long term (current) drug therapy: Secondary | ICD-10-CM | POA: Diagnosis not present

## 2020-08-29 DIAGNOSIS — D509 Iron deficiency anemia, unspecified: Secondary | ICD-10-CM | POA: Diagnosis not present

## 2020-08-29 DIAGNOSIS — I4891 Unspecified atrial fibrillation: Secondary | ICD-10-CM | POA: Insufficient documentation

## 2020-08-29 DIAGNOSIS — Z7901 Long term (current) use of anticoagulants: Secondary | ICD-10-CM | POA: Diagnosis not present

## 2020-08-29 DIAGNOSIS — D649 Anemia, unspecified: Secondary | ICD-10-CM | POA: Diagnosis not present

## 2020-08-29 DIAGNOSIS — N189 Chronic kidney disease, unspecified: Secondary | ICD-10-CM | POA: Diagnosis not present

## 2020-08-29 DIAGNOSIS — E611 Iron deficiency: Secondary | ICD-10-CM | POA: Diagnosis not present

## 2020-08-29 DIAGNOSIS — D631 Anemia in chronic kidney disease: Secondary | ICD-10-CM | POA: Insufficient documentation

## 2020-08-29 NOTE — Progress Notes (Signed)
Hematology and Oncology Follow Up Visit  Charlene Wade [id number] [birth year] 86 y.o. 08/29/2020 3:32 PM Charlene Wade, MDKoberlein, Charlene Berg, MD   Principle Diagnosis: 86 year old woman with multifactorial anemia related to iron deficiency as well as chronic renal insufficiency diagnosed in 2020.   Prior Therapy: Oral iron replacement.  Current therapy: Under evaluation for additional therapy.  Interim History: [name] returns today for a follow-up visit.  Since her last visit, he has reported very little mobility or changes in her quality of life.  The ambulates only short distance to the bathroom.  The has reported pain after sustaining a fall on her coccyx.  She reports chronic shortness of breath with minimal exertion but none at rest.  Laboratory data obtained on August 02, 2020 showed a hemoglobin of 8.2 with a normal white cell count and platelet count.     Medications: I have reviewed the patient's current medications.  Current Outpatient Medications  Medication Sig Dispense Refill  . acetaminophen (TYLENOL) 500 MG tablet Take 500 mg by mouth 2 (two) times daily as needed for headache (pain).     Marland Kitchen albuterol (VENTOLIN HFA) 108 (90 Base) MCG/ACT inhaler Inhale 2 puffs into the lungs every 6 (six) hours as needed. 1 each 1  . allopurinol (ZYLOPRIM) 100 MG tablet Take 1 tablet (100 mg total) by mouth daily. 90 tablet 1  . benzonatate (TESSALON) 100 MG capsule TAKE 1 CAPSULE(100 MG) BY MOUTH THREE TIMES DAILY AS NEEDED 30 capsule 1  . calcitonin, salmon, (MIACALCIN/FORTICAL) 200 UNIT/ACT nasal spray   0  . carvedilol (COREG) 6.25 MG tablet Take 1 tablet (6.25 mg total) by mouth 2 (two) times daily with a meal. 180 tablet 3  . cetirizine (ZYRTEC) 10 MG tablet Take 10 mg by mouth at bedtime.     . cholecalciferol (VITAMIN D) 1000 units tablet Take 2,000 Units by mouth 2 (two) times daily.    . Cyanocobalamin (VITAMIN B12 PO) Take 1 tablet by mouth daily.     Marland Kitchen denosumab  (PROLIA) 60 MG/ML SOLN injection Inject 60 mg into the skin every 6 (six) months. Administer in upper arm, thigh, or abdomen (last injection June or July 2018)    . diclofenac sodium (VOLTAREN) 1 % GEL Apply 1 application topically 4 (four) times daily as needed (arm pain/bursitis).    Marland Kitchen diltiazem (CARDIZEM CD) 240 MG 24 hr capsule TAKE 1 CAPSULE(240 MG) BY MOUTH DAILY 30 capsule 3  . DULoxetine (CYMBALTA) 20 MG capsule TAKE 1 CAPSULE(20 MG) BY MOUTH DAILY 90 capsule 1  . ELIQUIS 5 MG TABS tablet TAKE 1 TABLET(5 MG) BY MOUTH TWICE DAILY 180 tablet 3  . fenofibrate (TRICOR) 145 MG tablet TAKE 1 TABLET(145 MG) BY MOUTH DAILY 90 tablet 1  . ferrous sulfate 325 (65 FE) MG EC tablet TAKE 1 TABLET(325 MG) BY MOUTH TWICE DAILY BEFORE A MEAL 60 tablet 3  . fluticasone (FLONASE) 50 MCG/ACT nasal spray INSTILL 1 SPRAY INTO EACH NOSTRIL TWICE DAILY 48 g 4  . fluticasone (FLOVENT HFA) 44 MCG/ACT inhaler Inhale 2 puffs into the lungs 2 (two) times daily for 14 days. 1 Inhaler 0  . furosemide (LASIX) 20 MG tablet TAKE 1 TABLET(20 MG) BY MOUTH DAILY AS NEEDED FOR SWELLING IN LEGS 30 tablet 0  . losartan (COZAAR) 50 MG tablet TAKE 1 TABLET(50 MG) BY MOUTH DAILY 90 tablet 0  . Multiple Vitamins-Minerals (PRESERVISION AREDS PO) Take 1 tablet by mouth 3 (three) times daily.     Marland Kitchen  oxybutynin (DITROPAN-XL) 5 MG 24 hr tablet TAKE 1 TABLET BY MOUTH EVERY DAY 90 tablet 1  . pantoprazole (PROTONIX) 20 MG tablet TAKE 1 TABLET(20 MG) BY MOUTH DAILY 90 tablet 0  . Polyethyl Glycol-Propyl Glycol (SYSTANE OP) Place 1 drop into both eyes 2 (two) times daily as needed (dry eyes).    Marland Kitchen Spacer/Aero-Holding Chambers (AEROCHAMBER PLUS) inhaler Use as instructed 1 each 0  . traMADol (ULTRAM) 50 MG tablet Take 1 tablet (50 mg total) by mouth every 6 (six) hours as needed. 30 tablet 0   No current facility-administered medications for this visit.     Allergies:  Allergies  Allergen Reactions  . Levaquin [Levofloxacin] Other (See  Comments)    Severe intermittent arm pain  . Ace Inhibitors Cough  . Codeine Other (See Comments)    Unknown reaction - didn't feel well?  . Penicillins Other (See Comments)    Caused high fever Has patient had a PCN reaction causing immediate rash, facial/tongue/throat swelling, SOB or lightheadedness with hypotension: No Has patient had a PCN reaction causing severe rash involving mucus membranes or skin necrosis: No Has patient had a PCN reaction that required hospitalization: No Has patient had a PCN reaction occurring within the last 10 years: No If all of the above answers are "NO", then may proceed with Cephalosporin use.  . Sulfa Antibiotics Other (See Comments)    Possibly caused fever  . Sulfasalazine Other (See Comments)    Possibly caused fever     Physical Exam: Blood pressure (!) 108/59, pulse 78, temperature 97.6 F (36.4 C), temperature source Tympanic, resp. rate 18, height 4\' 9"  (1.448 m), weight 116 lb 11.2 oz (52.9 kg), SpO2 100 %. ECOG:  3    General appearance: Alert, awake without any distress. Head: Atraumatic without abnormalities Oropharynx: Without any thrush or ulcers. Eyes: No scleral icterus. Lymph nodes: No lymphadenopathy noted in the cervical, supraclavicular, or axillary nodes Heart:regular rate and rhythm, without any murmurs or gallops.   Lung: Clear to auscultation without any rhonchi, wheezes or dullness to percussion. Abdomin: Soft, nontender without any shifting dullness or ascites. Musculoskeletal: No clubbing or cyanosis. Neurological: No motor or sensory deficits. Skin: No rashes or lesions.      Lab Results: Lab Results  Component Value Date   WBC 5.0 08/02/2020   HGB 8.2 (L) 08/02/2020   HCT 26.4 (L) 08/02/2020   MCV 95.0 08/02/2020   PLT 258 08/02/2020     Chemistry      Component Value Date/Time   NA 139 08/02/2020 1444   NA 141 01/18/2020 1552   K 5.0 08/02/2020 1444   CL 110 08/02/2020 1444   CO2 22 08/02/2020  1444   BUN 27 (H) 08/02/2020 1444   BUN 18 01/18/2020 1552   CREATININE 1.87 (H) 08/02/2020 1444      Component Value Date/Time   CALCIUM 8.7 08/02/2020 1444   ALKPHOS 42 03/01/2020 1603   AST 26 08/02/2020 1444   AST 19 02/26/2019 1430   ALT 7 08/02/2020 1444   ALT <6 02/26/2019 1430   BILITOT 0.5 08/02/2020 1444   BILITOT 0.3 02/26/2019 1430         Impression and Plan:  86 year old woman with:  1.  Multifactorial anemia with element of iron deficiency and mostly chronic renal insufficiency.  Her hemoglobin in range between 8 and 10 in the last 2 years.  He is symptomatic although it is unclear if this is related to her anemia.  She has been on oral iron replacement without any significant improvement in her hemoglobin  Treatment options moving forward were reviewed today.  The risks and benefits of growth factor support in the form of Aranesp or Procrit were discussed.  Complications of this therapy include hypertension and injection related problems as well as thromboembolism.  After discussion she is agreeable to try this approach for a period of time and will assess in the next 3 months.  She understands alternative option would be back red cell transfusion which she is against at this time.   2.  Iron deficiency: She is currently on oral iron replacement.  Iron studies in May 2021 have been normalized.  We will update iron studies in the near future.  I recommended continuing oral iron replacement.  3.  Chronic renal insufficiency: Her creatinine clearance is close to 28 cc/min and likely contributing to her anemia.  4.  Follow-up: Will be in the next few weeks to start Aranesp.  30  minutes were dedicated to this visit. The time was spent on reviewing laboratory data,discussing treatment options, discussing differential diagnosis and answering questions regarding future plan.       Zola Button, MD 11/2/20213:32 PM

## 2020-09-06 ENCOUNTER — Telehealth: Payer: Self-pay | Admitting: Hematology and Oncology

## 2020-09-06 NOTE — Telephone Encounter (Signed)
Error

## 2020-09-07 ENCOUNTER — Inpatient Hospital Stay: Payer: Medicare Other

## 2020-09-11 ENCOUNTER — Other Ambulatory Visit: Payer: Self-pay | Admitting: Family Medicine

## 2020-09-13 ENCOUNTER — Inpatient Hospital Stay: Payer: Medicare Other

## 2020-09-13 ENCOUNTER — Other Ambulatory Visit: Payer: Self-pay

## 2020-09-13 VITALS — BP 119/50 | HR 86 | Temp 98.0°F | Resp 18

## 2020-09-13 DIAGNOSIS — I4891 Unspecified atrial fibrillation: Secondary | ICD-10-CM | POA: Diagnosis not present

## 2020-09-13 DIAGNOSIS — E669 Obesity, unspecified: Secondary | ICD-10-CM | POA: Diagnosis not present

## 2020-09-13 DIAGNOSIS — N189 Chronic kidney disease, unspecified: Secondary | ICD-10-CM | POA: Diagnosis not present

## 2020-09-13 DIAGNOSIS — Z79899 Other long term (current) drug therapy: Secondary | ICD-10-CM | POA: Diagnosis not present

## 2020-09-13 DIAGNOSIS — E611 Iron deficiency: Secondary | ICD-10-CM | POA: Diagnosis not present

## 2020-09-13 DIAGNOSIS — D631 Anemia in chronic kidney disease: Secondary | ICD-10-CM | POA: Diagnosis not present

## 2020-09-13 DIAGNOSIS — D509 Iron deficiency anemia, unspecified: Secondary | ICD-10-CM | POA: Diagnosis not present

## 2020-09-13 DIAGNOSIS — Z7901 Long term (current) use of anticoagulants: Secondary | ICD-10-CM | POA: Diagnosis not present

## 2020-09-13 DIAGNOSIS — D649 Anemia, unspecified: Secondary | ICD-10-CM

## 2020-09-13 LAB — CBC WITH DIFFERENTIAL (CANCER CENTER ONLY)
Abs Immature Granulocytes: 0.01 10*3/uL (ref 0.00–0.07)
Basophils Absolute: 0 10*3/uL (ref 0.0–0.1)
Basophils Relative: 1 %
Eosinophils Absolute: 0.2 10*3/uL (ref 0.0–0.5)
Eosinophils Relative: 4 %
HCT: 29.9 % — ABNORMAL LOW (ref 36.0–46.0)
Hemoglobin: 9 g/dL — ABNORMAL LOW (ref 12.0–15.0)
Immature Granulocytes: 0 %
Lymphocytes Relative: 23 %
Lymphs Abs: 1.2 10*3/uL (ref 0.7–4.0)
MCH: 29 pg (ref 26.0–34.0)
MCHC: 30.1 g/dL (ref 30.0–36.0)
MCV: 96.5 fL (ref 80.0–100.0)
Monocytes Absolute: 0.6 10*3/uL (ref 0.1–1.0)
Monocytes Relative: 12 %
Neutro Abs: 3.3 10*3/uL (ref 1.7–7.7)
Neutrophils Relative %: 60 %
Platelet Count: 276 10*3/uL (ref 150–400)
RBC: 3.1 MIL/uL — ABNORMAL LOW (ref 3.87–5.11)
RDW: 16.7 % — ABNORMAL HIGH (ref 11.5–15.5)
WBC Count: 5.4 10*3/uL (ref 4.0–10.5)
nRBC: 0 % (ref 0.0–0.2)

## 2020-09-13 MED ORDER — DARBEPOETIN ALFA 300 MCG/0.6ML IJ SOSY
PREFILLED_SYRINGE | INTRAMUSCULAR | Status: AC
  Filled 2020-09-13: qty 0.6

## 2020-09-13 MED ORDER — DARBEPOETIN ALFA 300 MCG/0.6ML IJ SOSY
300.0000 ug | PREFILLED_SYRINGE | Freq: Once | INTRAMUSCULAR | Status: AC
  Administered 2020-09-13: 300 ug via SUBCUTANEOUS

## 2020-09-13 NOTE — Patient Instructions (Signed)
Darbepoetin Alfa injection What is this medicine? DARBEPOETIN ALFA (dar be POE e tin AL fa) helps your body make more red blood cells. It is used to treat anemia caused by chronic kidney failure and chemotherapy. This medicine may be used for other purposes; ask your health care provider or pharmacist if you have questions. COMMON BRAND NAME(S): Aranesp What should I tell my health care provider before I take this medicine? They need to know if you have any of these conditions:  blood clotting disorders or history of blood clots  cancer patient not on chemotherapy  cystic fibrosis  heart disease, such as angina, heart failure, or a history of a heart attack  hemoglobin level of 12 g/dL or greater  high blood pressure  low levels of folate, iron, or vitamin B12  seizures  an unusual or allergic reaction to darbepoetin, erythropoietin, albumin, hamster proteins, latex, other medicines, foods, dyes, or preservatives  pregnant or trying to get pregnant  breast-feeding How should I use this medicine? This medicine is for injection into a vein or under the skin. It is usually given by a health care professional in a hospital or clinic setting. If you get this medicine at home, you will be taught how to prepare and give this medicine. Use exactly as directed. Take your medicine at regular intervals. Do not take your medicine more often than directed. It is important that you put your used needles and syringes in a special sharps container. Do not put them in a trash can. If you do not have a sharps container, call your pharmacist or healthcare provider to get one. A special MedGuide will be given to you by the pharmacist with each prescription and refill. Be sure to read this information carefully each time. Talk to your pediatrician regarding the use of this medicine in children. While this medicine may be used in children as Suriyah Vergara as 1 month of age for selected conditions, precautions do  apply. Overdosage: If you think you have taken too much of this medicine contact a poison control center or emergency room at once. NOTE: This medicine is only for you. Do not share this medicine with others. What if I miss a dose? If you miss a dose, take it as soon as you can. If it is almost time for your next dose, take only that dose. Do not take double or extra doses. What may interact with this medicine? Do not take this medicine with any of the following medications:  epoetin alfa This list may not describe all possible interactions. Give your health care provider a list of all the medicines, herbs, non-prescription drugs, or dietary supplements you use. Also tell them if you smoke, drink alcohol, or use illegal drugs. Some items may interact with your medicine. What should I watch for while using this medicine? Your condition will be monitored carefully while you are receiving this medicine. You may need blood work done while you are taking this medicine. This medicine may cause a decrease in vitamin B6. You should make sure that you get enough vitamin B6 while you are taking this medicine. Discuss the foods you eat and the vitamins you take with your health care professional. What side effects may I notice from receiving this medicine? Side effects that you should report to your doctor or health care professional as soon as possible:  allergic reactions like skin rash, itching or hives, swelling of the face, lips, or tongue  breathing problems  changes in   vision  chest pain  confusion, trouble speaking or understanding  feeling faint or lightheaded, falls  high blood pressure  muscle aches or pains  pain, swelling, warmth in the leg  rapid weight gain  severe headaches  sudden numbness or weakness of the face, arm or leg  trouble walking, dizziness, loss of balance or coordination  seizures (convulsions)  swelling of the ankles, feet, hands  unusually weak or  tired Side effects that usually do not require medical attention (report to your doctor or health care professional if they continue or are bothersome):  diarrhea  fever, chills (flu-like symptoms)  headaches  nausea, vomiting  redness, stinging, or swelling at site where injected This list may not describe all possible side effects. Call your doctor for medical advice about side effects. You may report side effects to FDA at 1-800-FDA-1088. Where should I keep my medicine? Keep out of the reach of children. Store in a refrigerator between 2 and 8 degrees C (36 and 46 degrees F). Do not freeze. Do not shake. Throw away any unused portion if using a single-dose vial. Throw away any unused medicine after the expiration date. NOTE: This sheet is a summary. It may not cover all possible information. If you have questions about this medicine, talk to your doctor, pharmacist, or health care provider.  2020 [employer] Standard (2017-10-29 16:44:20)  

## 2020-09-14 LAB — IRON AND TIBC
Iron: 57 ug/dL (ref 41–142)
Saturation Ratios: 16 % — ABNORMAL LOW (ref 21–57)
TIBC: 365 ug/dL (ref 236–444)
UIBC: 308 ug/dL (ref 120–384)

## 2020-09-14 LAB — FERRITIN: Ferritin: 62 ng/mL (ref 11–307)

## 2020-09-15 ENCOUNTER — Emergency Department (HOSPITAL_COMMUNITY): Payer: Medicare Other

## 2020-09-15 ENCOUNTER — Other Ambulatory Visit: Payer: Self-pay

## 2020-09-15 ENCOUNTER — Emergency Department (HOSPITAL_COMMUNITY)
Admission: EM | Admit: 2020-09-15 | Discharge: 2020-09-15 | Disposition: A | Discharge status: Home / Self Care | Payer: Medicare Other | Attending: Emergency Medicine | Admitting: Emergency Medicine

## 2020-09-15 ENCOUNTER — Encounter (HOSPITAL_COMMUNITY): Payer: Self-pay | Admitting: Emergency Medicine

## 2020-09-15 DIAGNOSIS — S7002XA Contusion of left hip, initial encounter: Secondary | ICD-10-CM | POA: Diagnosis not present

## 2020-09-15 DIAGNOSIS — I129 Hypertensive chronic kidney disease with stage 1 through stage 4 chronic kidney disease, or unspecified chronic kidney disease: Secondary | ICD-10-CM | POA: Insufficient documentation

## 2020-09-15 DIAGNOSIS — M25552 Pain in left hip: Secondary | ICD-10-CM | POA: Diagnosis not present

## 2020-09-15 DIAGNOSIS — Z7901 Long term (current) use of anticoagulants: Secondary | ICD-10-CM | POA: Insufficient documentation

## 2020-09-15 DIAGNOSIS — N183 Chronic kidney disease, stage 3 unspecified: Secondary | ICD-10-CM | POA: Insufficient documentation

## 2020-09-15 DIAGNOSIS — R52 Pain, unspecified: Secondary | ICD-10-CM | POA: Diagnosis not present

## 2020-09-15 DIAGNOSIS — Z79899 Other long term (current) drug therapy: Secondary | ICD-10-CM | POA: Diagnosis not present

## 2020-09-15 DIAGNOSIS — M545 Low back pain, unspecified: Secondary | ICD-10-CM | POA: Diagnosis not present

## 2020-09-15 DIAGNOSIS — W19XXXA Unspecified fall, initial encounter: Secondary | ICD-10-CM | POA: Diagnosis not present

## 2020-09-15 DIAGNOSIS — S301XXA Contusion of abdominal wall, initial encounter: Secondary | ICD-10-CM

## 2020-09-15 DIAGNOSIS — S79912A Unspecified injury of left hip, initial encounter: Secondary | ICD-10-CM | POA: Diagnosis present

## 2020-09-15 DIAGNOSIS — Z87891 Personal history of nicotine dependence: Secondary | ICD-10-CM | POA: Diagnosis not present

## 2020-09-15 DIAGNOSIS — M47816 Spondylosis without myelopathy or radiculopathy, lumbar region: Secondary | ICD-10-CM | POA: Diagnosis not present

## 2020-09-15 DIAGNOSIS — M549 Dorsalgia, unspecified: Secondary | ICD-10-CM | POA: Diagnosis not present

## 2020-09-15 DIAGNOSIS — I4891 Unspecified atrial fibrillation: Secondary | ICD-10-CM | POA: Insufficient documentation

## 2020-09-15 DIAGNOSIS — R609 Edema, unspecified: Secondary | ICD-10-CM | POA: Diagnosis not present

## 2020-09-15 MED ORDER — ACETAMINOPHEN 325 MG PO TABS
650.0000 mg | ORAL_TABLET | Freq: Once | ORAL | Status: AC
  Administered 2020-09-15: 650 mg via ORAL
  Filled 2020-09-15: qty 2

## 2020-09-15 MED ORDER — LIDOCAINE 4 % EX PTCH: 1.0000 | MEDICATED_PATCH | Freq: Two times a day (BID) | CUTANEOUS | 0 refills | Status: DC

## 2020-09-15 MED ORDER — ACETAMINOPHEN 500 MG PO TABS: 500.0000 mg | ORAL_TABLET | Freq: Four times a day (QID) | ORAL | 0 refills | Status: AC | PRN

## 2020-09-15 MED ORDER — ACETAMINOPHEN 500 MG PO TABS: 500.0000 mg | ORAL_TABLET | Freq: Two times a day (BID) | ORAL | 0 refills | Status: DC | PRN

## 2020-09-15 MED ORDER — ACETAMINOPHEN 500 MG PO TABS: 500.0000 mg | ORAL_TABLET | Freq: Four times a day (QID) | ORAL | 0 refills | Status: DC | PRN

## 2020-09-15 MED ORDER — DILTIAZEM HCL 30 MG PO TABS
30.0000 mg | ORAL_TABLET | Freq: Once | ORAL | Status: AC
  Administered 2020-09-15: 30 mg via ORAL
  Filled 2020-09-15: qty 1

## 2020-09-15 NOTE — ED Triage Notes (Signed)
BIBA Per EMS:  Pt coming from home  Pt fell x2 days ago and having complaints of bruising on buttox that has gotten worse and has been walking slower than usual.  Bilateral edema  Vitals:  150/80 80 HR  97% room air  16 RR

## 2020-09-15 NOTE — ED Provider Notes (Signed)
[facility] DEPT Provider Note   CSN: [id number] Arrival date & time: 09/15/20  1654     History Chief Complaint  Patient presents with  . Fall    Bruising on buttox     Charlene Wade is a 86 y.o. female.  HPI    86 year old female comes in a chief complaint of fall. Patient has history of A. fib and is on Eliquis.  She also has history of CKD and stroke.  Patient here with her caregiver.  About 2 weeks ago patient had a mechanical fall at nighttime.  Since then she has had persistent pain over her left hip.  Caregiver informs me that there is a bruise at that site.  Patient is walking, but she has been in pain.  Patient takes tramadol but has not had significant relief with it.  She did not seek any care after the fall and has not seen her PCP about it.  Past Medical History:  Diagnosis Date  . Allergic rhinitis   . Atrial fibrillation ([place]) 09/26/2017   New-onset A. Fib now persistent  . BCC (basal cell carcinoma of skin) 04/10/2015   sees dermatologist  . CKD (chronic kidney disease) stage 3, GFR 30-59 ml/min ([place]) 04/26/2015  . Depression 10/17/2012  . Ectopic pregnancy   . GERD (gastroesophageal reflux disease)    hx hiatal hernia, has seen GI, ENT and allergist in the past  . Hyperlipidemia   . Hypertension   . OA (osteoarthritis)    knees and back, hx DDD, s/p remote lumbar disc surgery  . OAB (overactive bladder) 04/10/2015  . Obesity   . Osteoporosis    on prolia in the past  . Stroke [facility])    sees neurologist, hx memory loss, expressive aphasia  . Venous insufficiency     Patient Active Problem List   Diagnosis Date Noted  . Anemia 08/29/2020  . Aphasia 09/28/2017  . Atrial fibrillation ([place]) 09/26/2017  . Recurrent major depressive disorder, in partial remission ([place]) 09/02/2017  . Other chronic pain 09/02/2017  . Morbid obesity ([place]) 07/08/2017  . Neck mass 02/25/2017  . Vitamin D deficiency 06/03/2016  . CKD  (chronic kidney disease) stage 3, GFR 30-59 ml/min ([facility]) 04/26/2015  . GERD (gastroesophageal reflux disease) 04/10/2015  . Osteoporosis 04/10/2015  . Leg edema 04/10/2015  . OAB (overactive bladder) 04/10/2015  . Osteoarthritis 04/10/2015  . Allergic rhinitis 04/10/2015  . Chronic edema 03/15/2014  . Pituitary adenoma ([place]) 03/15/2014  . Edema 01/24/2014  . Backache 06/09/2013  . Cerebral artery occlusion with cerebral infarction ([place]) 06/09/2013  . Hearing loss 06/09/2013  . 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 Depressive disorder 06/09/2013  . Senile osteoporosis 06/09/2013  . Spinal stenosis 06/09/2013  . Transient cerebral ischemia 06/09/2013  . Hyperlipidemia 10/13/2012  . H/O: CVA (cerebrovascular accident) 10/12/2012  . Essential hypertension 10/12/2012    Past Surgical History:  Procedure Laterality Date  . ABDOMINAL HYSTERECTOMY    . APPENDECTOMY    . BREAST REDUCTION SURGERY    . LAPAROSCOPIC SALPINGOOPHERECTOMY    . NASAL MASS EXCISION     Basal Cell Cancer Excision  . NASAL RECONSTRUCTION    . TEE WITHOUT CARDIOVERSION  10/22/2012   Procedure: TRANSESOPHAGEAL ECHOCARDIOGRAM (TEE);  Surgeon: Josue Hector, MD;  Location: [facility] ENDOSCOPY;  Service: Cardiovascular;  Laterality: N/A;     OB History   No obstetric history on file.     Family History  Problem Relation Age of  Onset  . Hypertension Father   . Hypertension Mother   . Cancer - Lung Brother     Social History   Tobacco Use  . Smoking status: Former Research scientist (life sciences)  . Smokeless tobacco: Never Used  Vaping Use  . Vaping Use: Never used  Substance Use Topics  . Alcohol use: No  . Drug use: No    Home Medications Prior to Admission medications   Medication Sig Start Date End Date Taking? Authorizing Provider  acetaminophen (TYLENOL) 500 MG tablet Take 1 tablet (500 mg total) by mouth every 6 (six) hours as needed. 09/15/20   Varney Biles, MD  acetaminophen (TYLENOL) 500 MG tablet Take 1  tablet (500 mg total) by mouth 2 (two) times daily as needed for headache (pain). 09/15/20   Varney Biles, MD  albuterol (VENTOLIN HFA) 108 (90 Base) MCG/ACT inhaler Inhale 2 puffs into the lungs every 6 (six) hours as needed. 08/02/20   Koberlein, Steele Berg, MD  allopurinol (ZYLOPRIM) 100 MG tablet TAKE 1 TABLET(100 MG) BY MOUTH DAILY 09/11/20   Koberlein, Junell C, MD  benzonatate (TESSALON) 100 MG capsule TAKE 1 CAPSULE(100 MG) BY MOUTH THREE TIMES DAILY AS NEEDED 09/22/19   Caren Macadam, MD  calcitonin, salmon, (MIACALCIN/FORTICAL) 200 UNIT/ACT nasal spray  06/26/18   [provider]  carvedilol (COREG) 6.25 MG tablet Take 1 tablet (6.25 mg total) by mouth 2 (two) times daily with a meal. 01/18/20   Lorretta Harp, MD  cetirizine (ZYRTEC) 10 MG tablet Take 10 mg by mouth at bedtime.     [provider]  cholecalciferol (VITAMIN D) 1000 units tablet Take 2,000 Units by mouth 2 (two) times daily.    [provider]  Cyanocobalamin (VITAMIN B12 PO) Take 1 tablet by mouth daily.     [provider]  denosumab (PROLIA) 60 MG/ML SOLN injection Inject 60 mg into the skin every 6 (six) months. Administer in upper arm, thigh, or abdomen (last injection June or July 2018)    [provider]  diclofenac sodium (VOLTAREN) 1 % GEL Apply 1 application topically 4 (four) times daily as needed (arm pain/bursitis).    [provider]  diltiazem (CARDIZEM CD) 240 MG 24 hr capsule TAKE 1 CAPSULE(240 MG) BY MOUTH DAILY 07/13/20   Koberlein, Junell C, MD  DULoxetine (CYMBALTA) 20 MG capsule TAKE 1 CAPSULE(20 MG) BY MOUTH DAILY 11/01/19   Koberlein, Junell C, MD  ELIQUIS 5 MG TABS tablet TAKE 1 TABLET(5 MG) BY MOUTH TWICE DAILY 08/29/20   Koberlein, Andris Flurry C, MD  fenofibrate (TRICOR) 145 MG tablet TAKE 1 TABLET(145 MG) BY MOUTH DAILY 07/31/20   Koberlein, Junell C, MD  ferrous sulfate 325 (65 FE) MG EC tablet TAKE 1 TABLET(325 MG) BY MOUTH TWICE DAILY BEFORE A  MEAL 07/04/20   Wyatt Portela, MD  fluticasone (FLONASE) 50 MCG/ACT nasal spray INSTILL 1 SPRAY INTO EACH NOSTRIL TWICE DAILY 06/16/19   Koberlein, Steele Berg, MD  fluticasone (FLOVENT HFA) 44 MCG/ACT inhaler Inhale 2 puffs into the lungs 2 (two) times daily for 14 days. 09/07/18 09/21/18  Lucretia Kern, DO  furosemide (LASIX) 20 MG tablet TAKE 1 TABLET(20 MG) BY MOUTH DAILY AS NEEDED FOR SWELLING IN LEGS 04/24/20   Koberlein, Andris Flurry C, MD  Lidocaine 4 % PTCH Apply 1 patch topically 2 (two) times daily. 09/15/20   Varney Biles, MD  losartan (COZAAR) 50 MG tablet TAKE 1 TABLET(50 MG) BY MOUTH DAILY 08/12/20   Caren Macadam, MD  Multiple Vitamins-Minerals (PRESERVISION AREDS PO) Take 1 tablet by mouth 3 (three) times daily.     [provider]  oxybutynin (DITROPAN-XL) 5 MG 24 hr tablet TAKE 1 TABLET BY MOUTH EVERY DAY 05/26/20   Koberlein, Junell C, MD  pantoprazole (PROTONIX) 20 MG tablet TAKE 1 TABLET(20 MG) BY MOUTH DAILY 07/31/20   Koberlein, Steele Berg, MD  Polyethyl Glycol-Propyl Glycol (SYSTANE OP) Place 1 drop into both eyes 2 (two) times daily as needed (dry eyes).    [provider]  Spacer/Aero-Holding Chambers (AEROCHAMBER PLUS) inhaler Use as instructed 08/02/20   Caren Macadam, MD  traMADol (ULTRAM) 50 MG tablet Take 1 tablet (50 mg total) by mouth every 6 (six) hours as needed. 07/15/18   Burchette, Alinda Sierras, MD    Allergies    Levaquin [levofloxacin], Ace inhibitors, Codeine, Penicillins, Sulfa antibiotics, and Sulfasalazine  Review of Systems   Review of Systems  Constitutional: Positive for activity change.  Cardiovascular: Negative for chest pain.  Musculoskeletal: Positive for arthralgias.  Skin: Positive for rash.  Hematological: Bruises/bleeds easily.    Physical Exam Updated Vital Signs BP (!) 149/135   Pulse (!) 107   Temp 98 F (36.7 C) (Oral)   Resp 18   Ht 4\' 9"  (1.448 m)   Wt 52.9 kg   SpO2 94%   BMI 25.24 kg/m   Physical  Exam Vitals and nursing note reviewed.  Constitutional:      Appearance: She is well-developed.  HENT:     Head: Normocephalic and atraumatic.  Eyes:     Pupils: Pupils are equal, round, and reactive to light.  Neck:     Comments: No midline c-spine tenderness Cardiovascular:     Rate and Rhythm: Normal rate and regular rhythm.     Heart sounds: No murmur heard.   Pulmonary:     Effort: Pulmonary effort is normal. No respiratory distress.     Breath sounds: Normal breath sounds.  Chest:     Chest wall: No tenderness.  Abdominal:     General: Bowel sounds are normal.     Palpations: Abdomen is soft.     Tenderness: There is no abdominal tenderness.  Musculoskeletal:     Cervical back: Neck supple.     Comments: Patient has tenderness over the left posterior hip.  There is mild tenderness over the coccyx.  Positive ecchymosis appreciated over the gluteal region medially.  No long bone tenderness - upper and lower extrmeities and no pelvic pain, instability.  Skin:    General: Skin is warm and dry.     Findings: Bruising present. No rash.  Neurological:     Mental Status: She is alert and oriented to person, place, and time.     Cranial Nerves: No cranial nerve deficit.     ED Results / Procedures / Treatments   Labs (all labs ordered are listed, but only abnormal results are displayed) Labs Reviewed - No data to display  EKG None  Radiology DG Sacrum/Coccyx  Result Date: 09/15/2020 CLINICAL DATA:  Left hip pain EXAM: SACRUM AND COCCYX - 2+ VIEW COMPARISON:  None. FINDINGS: There is no evidence of fracture or other focal bone lesions. Degenerative changes in the visualized lower lumbar spine. IMPRESSION: No acute bony abnormality. Electronically Signed   By: Rolm Baptise M.D.   On: 09/15/2020 19:58   DG Hip Unilat W or Wo Pelvis 2-3 Views Left  Result Date: 09/15/2020 CLINICAL DATA:  Left hip pain EXAM: DG HIP (  WITH OR WITHOUT PELVIS) 2-3V LEFT COMPARISON:  None.  FINDINGS: Hip joints and SI joints are symmetric. Degenerative changes in the visualized lower lumbar spine. No acute bony abnormality. Specifically, no fracture, subluxation, or dislocation. Vascular calcifications noted. IMPRESSION: No acute bony abnormality. Electronically Signed   By: Rolm Baptise M.D.   On: 09/15/2020 19:57    Procedures Procedures (including critical care time)  Medications Ordered in ED Medications - No data to display  ED Course  I have reviewed the triage vital signs and the nursing notes.  Pertinent labs & imaging results that were available during my care of the patient were reviewed by me and considered in my medical decision making (see chart for details).  Clinical Course as of Sep 15 2052  Ludwig Clarks Sep 15, 2020  2046 Patient ambulated after results of the x-ray came back.  Likely she has a hip contusion.  Stable for discharge.  We will give lidocaine patch.   [AN]    Clinical Course User Index [AN] Varney Biles, MD   MDM Rules/Calculators/A&P                          86 year old female comes in a chief complaint of fall.  She had a mechanical fall more than 2 weeks ago.  Since then she has been having persistent pain in her left hip.  Patient is ambulating, but slowly.  On exam she is able to tolerate internal and external rotation with discomfort.  Active flexion over the left hip is limited secondary to pain.  X-rays ordered and they are negative.  Patient was ambulated in the ED post imaging and she was able to walk.  There is a possibility of internal bruising of the bone or deep soft tissue. We will recommend RICE and conservative management with orthopedic follow-up if not getting better.  Orthopedic can decide on PT versus advanced imaging to evaluate for any ligamentous/tendon injury if needed.  Final Clinical Impression(s) / ED Diagnoses Final diagnoses:  Hip pointer, initial encounter    Rx / DC Orders ED Discharge Orders         Ordered     Lidocaine 4 % PTCH  2 times daily        09/15/20 2048    acetaminophen (TYLENOL) 500 MG tablet  Every 6 hours PRN        09/15/20 2049    acetaminophen (TYLENOL) 500 MG tablet  2 times daily PRN        09/15/20 2049           Varney Biles, MD 09/15/20 2057

## 2020-09-15 NOTE — ED Notes (Signed)
Patient transported to X-ray 

## 2020-09-15 NOTE — Discharge Instructions (Signed)
You are seen in the ER for hip pain.  Fortunately, the x-rays are not revealing any evidence of fracture.  Please read the instructions provided and commit to treating with RICE. Apply lidocaine patch for further symptom relief and take Tylenol every 6 hours.  We recommend that you see orthopedic doctor by calling and setting up an appointment if you are not getting better in 5 to 7 days.

## 2020-09-15 NOTE — ED Notes (Signed)
Pt ambulated down hallway to restroom with assistance.

## 2020-09-20 ENCOUNTER — Encounter: Payer: Self-pay | Admitting: Family Medicine

## 2020-09-20 ENCOUNTER — Other Ambulatory Visit: Payer: Self-pay

## 2020-09-20 ENCOUNTER — Ambulatory Visit (INDEPENDENT_AMBULATORY_CARE_PROVIDER_SITE_OTHER): Payer: Medicare Other | Admitting: Family Medicine

## 2020-09-20 VITALS — BP 118/62 | Wt 118.8 lb

## 2020-09-20 DIAGNOSIS — M25552 Pain in left hip: Secondary | ICD-10-CM

## 2020-09-20 DIAGNOSIS — I872 Venous insufficiency (chronic) (peripheral): Secondary | ICD-10-CM | POA: Diagnosis not present

## 2020-09-20 DIAGNOSIS — G8929 Other chronic pain: Secondary | ICD-10-CM | POA: Diagnosis not present

## 2020-09-20 DIAGNOSIS — R011 Cardiac murmur, unspecified: Secondary | ICD-10-CM | POA: Diagnosis not present

## 2020-09-20 DIAGNOSIS — M545 Low back pain, unspecified: Secondary | ICD-10-CM

## 2020-09-20 DIAGNOSIS — R600 Localized edema: Secondary | ICD-10-CM | POA: Diagnosis not present

## 2020-09-20 NOTE — Patient Instructions (Addendum)
You should schedule a follow up appointment with [facility] [place] for continued symptoms.  Chronic Venous Insufficiency Chronic venous insufficiency is a condition where the leg veins cannot effectively pump blood from the legs to the heart. This happens when the vein walls are either stretched, weakened, or damaged, or when the valves inside the vein are damaged. With the right treatment, you should be able to continue with an active life. This condition is also called venous stasis. What are the causes? Common causes of this condition include:  High blood pressure inside the veins (venous hypertension).  Sitting or standing too long, causing increased blood pressure in the leg veins.  A blood clot that blocks blood flow in a vein (deep vein thrombosis, DVT).  Inflammation of a vein (phlebitis) that causes a blood clot to form.  Tumors in the pelvis that cause blood to back up. What increases the risk? The following factors may make you more likely to develop this condition:  Having a family history of this condition.  Obesity.  Pregnancy.  Living without enough regular physical activity or exercise (sedentary lifestyle).  Smoking.  Having a job that requires long periods of standing or sitting in one place.  Being a certain age. Women in their 86s and 68s and men in their 75s are more likely to develop this condition. What are the signs or symptoms? Symptoms of this condition include:  Veins that are enlarged, bulging, or twisted (varicose veins).  Skin breakdown or ulcers.  Reddened skin or dark discoloration of skin on the leg between the knee and ankle.  Brown, smooth, tight, and painful skin just above the ankle, usually on the inside of the leg (lipodermatosclerosis).  Swelling of the legs. How is this diagnosed? This condition may be diagnosed based on:  Your medical history.  A physical exam.  Tests, such as: ? A procedure that creates an image of  a blood vessel and nearby organs and provides information about blood flow through the blood vessel (duplex ultrasound). ? A procedure that tests blood flow (plethysmography). ? A procedure that looks at the veins using X-ray and dye (venogram). How is this treated? The goals of treatment are to help you return to an active life and to minimize pain or disability. Treatment depends on the severity of your condition, and it may include:  Wearing compression stockings. These can help relieve symptoms and help prevent your condition from getting worse. However, they do not cure the condition.  Sclerotherapy. This procedure involves an injection of a solution that shrinks damaged veins.  Surgery. This may involve: ? Removing a diseased vein (vein stripping). ? Cutting off blood flow through the vein (laser ablation surgery). ? Repairing or reconstructing a valve within the affected vein. Follow these instructions at home:      Wear compression stockings as told by your health care provider. These stockings help to prevent blood clots and reduce swelling in your legs.  Take over-the-counter and prescription medicines only as told by your health care provider.  Stay active by exercising, walking, or doing different activities. Ask your health care provider what activities are safe for you and how much exercise you need.  Drink enough fluid to keep your urine pale yellow.  Do not use any products that contain nicotine or tobacco, such as cigarettes, e-cigarettes, and chewing tobacco. If you need help quitting, ask your health care provider.  Keep all follow-up visits as told by your health care provider. This is important.  Contact a health care provider if you:  Have redness, swelling, or more pain in the affected area.  See a red streak or line that goes up or down from the affected area.  Have skin breakdown or skin loss in the affected area, even if the breakdown is small.  Get an  injury in the affected area. Get help right away if:  You get an injury and an open wound in the affected area.  You have: ? Severe pain that does not get better with medicine. ? Sudden numbness or weakness in the foot or ankle below the affected area. ? Trouble moving your foot or ankle. ? A fever. ? Worse or persistent symptoms. ? Chest pain. ? Shortness of breath. Summary  Chronic venous insufficiency is a condition where the leg veins cannot effectively pump blood from the legs to the heart.  Chronic venous insufficiency occurs when the vein walls become stretched, weakened, or damaged, or when valves within the vein are damaged.  Treatment depends on how severe your condition is. It often involves wearing compression stockings and may involve having a procedure.  Make sure you stay active by exercising, walking, or doing different activities. Ask your health care provider what activities are safe for you and how much exercise you need. This information is not intended to replace advice given to you by your health care provider. Make sure you discuss any questions you have with your health care provider. Document Revised: 07/07/2018 Document Reviewed: 07/07/2018 Elsevier Patient Education  Mag[employer].  Hip Pain The hip is the joint between the upper legs and the lower pelvis. The bones, cartilage, tendons, and muscles of your hip joint support your body and allow you to move around. Hip pain can range from a minor ache to severe pain in one or both of your hips. The pain may be felt on the inside of the hip joint near the groin, or on the outside near the buttocks and upper thigh. You may also have swelling or stiffness in your hip area. Follow these instructions at home: Managing pain, stiffness, and swelling      If directed, put ice on the painful area. To do this: ? Put ice in a plastic bag. ? Place a towel between your skin and the bag. ? Leave the ice on for 20  minutes, 2-3 times a day.  If directed, apply heat to the affected area as often as told by your health care provider. Use the heat source that your health care provider recommends, such as a moist heat pack or a heating pad. ? Place a towel between your skin and the heat source. ? Leave the heat on for 20-30 minutes. ? Remove the heat if your skin turns bright red. This is especially important if you are unable to feel pain, heat, or cold. You may have a greater risk of getting burned. Activity  Do exercises as told by your health care provider.  Avoid activities that cause pain. General instructions   Take over-the-counter and prescription medicines only as told by your health care provider.  Keep a journal of your symptoms. Write down: ? How often you have hip pain. ? The location of your pain. ? What the pain feels like. ? What makes the pain worse.  Sleep with a pillow between your legs on your most comfortable side.  Keep all follow-up visits as told by your health care provider. This is important. Contact a health care provider  if:  You cannot put weight on your leg.  Your pain or swelling continues or gets worse after one week.  It gets harder to walk.  You have a fever. Get help right away if:  You fall.  You have a sudden increase in pain and swelling in your hip.  Your hip is red or swollen or very tender to touch. Summary  Hip pain can range from a minor ache to severe pain in one or both of your hips.  The pain may be felt on the inside of the hip joint near the groin, or on the outside near the buttocks and upper thigh.  Avoid activities that cause pain.  Write down how often you have hip pain, the location of the pain, what makes it worse, and what it feels like. This information is not intended to replace advice given to you by your health care provider. Make sure you discuss any questions you have with your health care provider. Document Revised:  03/01/2019 Document Reviewed: 03/01/2019 Elsevier Patient Education  [place].

## 2020-09-20 NOTE — Progress Notes (Signed)
Subjective:    Patient ID: [name], female    DOB: 1927-12-17, 86 y.o.   MRN: [id number]  No chief complaint on file. Patient accompanied by her caregiver, Cher.  HPI Patient is a 86 year old female with past medical history significant for venous insufficiency, history of stroke, HTN, A. fib, GERD, pituitary adenoma, hearing loss, OA, osteoporosis, OAB, CKD stage III, vitamin D deficiency, spinal stenosis, depression who is followed by Dr. Edilia Bo and seen for acute concerns.  Pt endorses fall from standing 11/3 while in the kitchen one night.  Pt did not seek medical care at that time.  Pt went to ED on 11/19 for L hip pain s/p that fall.  Xrays of hip and lumbar spine normal.  Pt advised symptoms likely 2/2 deep bruise.  Advised to f/u with Ortho.  Pt notes continued pain causing her to walk slower.  Sitting for most of the day.    Pt also with b/l LE edema.  Not currently elevating LEs.  Denies increased sodium intake.  In the past had someone come out to wrap her legs.  On lasix, but did not take any today.  Caregiver states she will not be elevating her legs and she will not take lasix tomorrow either as she will be with her family.  Past Medical History:  Diagnosis Date  . Allergic rhinitis   . Atrial fibrillation ([place]) 09/26/2017   New-onset A. Fib now persistent  . BCC (basal cell carcinoma of skin) 04/10/2015   sees dermatologist  . CKD (chronic kidney disease) stage 3, GFR 30-59 ml/min ([place]) 04/26/2015  . Depression 10/17/2012  . Ectopic pregnancy   . GERD (gastroesophageal reflux disease)    hx hiatal hernia, has seen GI, ENT and allergist in the past  . Hyperlipidemia   . Hypertension   . OA (osteoarthritis)    knees and back, hx DDD, s/p remote lumbar disc surgery  . OAB (overactive bladder) 04/10/2015  . Obesity   . Osteoporosis    on prolia in the past  . Stroke [facility])    sees neurologist, hx memory loss, expressive aphasia  . Venous insufficiency      Allergies  Allergen Reactions  . Levaquin [Levofloxacin] Other (See Comments)    Severe intermittent arm pain  . Ace Inhibitors Cough  . Codeine Other (See Comments)    Unknown reaction - didn't feel well?  . Penicillins Other (See Comments)    Caused high fever Has patient had a PCN reaction causing immediate rash, facial/tongue/throat swelling, SOB or lightheadedness with hypotension: No Has patient had a PCN reaction causing severe rash involving mucus membranes or skin necrosis: No Has patient had a PCN reaction that required hospitalization: No Has patient had a PCN reaction occurring within the last 10 years: No If all of the above answers are "NO", then may proceed with Cephalosporin use.  . Sulfa Antibiotics Other (See Comments)    Possibly caused fever  . Sulfasalazine Other (See Comments)    Possibly caused fever    ROS General: Denies fever, chills, night sweats, changes in weight, changes in appetite HEENT: Denies headaches, ear pain, changes in vision, rhinorrhea, sore throat CV: Denies CP, palpitations, SOB, orthopnea  +SOB, LE edema Pulm: Denies SOB, cough, wheezing  +SOB GI: Denies abdominal pain, nausea, vomiting, diarrhea, constipation GU: Denies dysuria, hematuria, frequency, vaginal discharge Msk: Denies muscle cramps, joint pains  + L hip pain Neuro: Denies weakness, numbness, tingling Skin: Denies rashes, bruising   Psych:  Denies depression, anxiety, hallucinations    Objective:    Blood pressure 118/62, weight 118 lb 12.8 oz (53.9 kg).  Gen. Pleasant, well-nourished, in no distress, normal affect   HEENT: [place]/AT, face symmetric, conjunctiva clear, no scleral icterus, PERRLA, EOMI, nares patent without drainage Lungs: no accessory muscle use, CTAB, no wheezes or rales Cardiovascular: irregular, 3/6 murmur, nonpitting bilateral LE edema Musculoskeletal: TTP in midline lumbar spine and bilateral LEs below knee.  No deformities, no cyanosis or  clubbing, normal tone Neuro:  A&Ox3, CN II-XII intact, gait not assessed.  Patient sitting in transport wheelchair.  Patient required 2 person assist to stand. Skin:  Warm,  Rash.  Chronic venous stasis changes noted bilaterally with weeping area on right medial lower leg.   Wt Readings from Last 3 Encounters:  09/15/20 116 lb 10 oz (52.9 kg)  08/29/20 116 lb 11.2 oz (52.9 kg)  08/02/20 121 lb 6.4 oz (55.1 kg)    Lab Results  Component Value Date   WBC 5.4 09/13/2020   HGB 9.0 (L) 09/13/2020   HCT 29.9 (L) 09/13/2020   PLT 276 09/13/2020   GLUCOSE 138 (H) 08/02/2020   CHOL 104 03/01/2020   TRIG 102.0 03/01/2020   HDL 37.80 (L) 03/01/2020   LDLCALC 46 03/01/2020   ALT 7 08/02/2020   AST 26 08/02/2020   NA 139 08/02/2020   K 5.0 08/02/2020   CL 110 08/02/2020   CREATININE 1.87 (H) 08/02/2020   BUN 27 (H) 08/02/2020   CO2 22 08/02/2020   TSH 1.38 03/01/2020   INR 1.38 09/26/2017   HGBA1C 6.0 06/16/2018    Assessment/Plan: Interesting dynamic between pt and caregiver.  Bilateral lower extremity edema -likely 2/2 venous insufficiency -discussed supportive care including wrapping LEs, elevating LEs etc -b/l LEs wrapped in office. -Continue Lasix -given handout  Venous insufficiency -Discussed supportive care -Wrapping of LEs and elevation encouraged -Continue Lasix -given handout   Heart murmur -Likely 2/2 severely calcified annulus of mitral valve as seen on ECHO 09/27/17 -EF 60-65% on echo from 09/27/2017 -consider repeat ECHO -Continue current medications -Continue to monitor  Chronic midline low back pain without sciatica -Likely exacerbated by recent fall causing contusion -Discussed supportive care including heat, massage, stretching, topical analgesics, etc. -Consider Voltaren gel and PT -Continue lidocaine patch  Left hip pain -X-rays from 09/15/2020 reassuring -Discussed follow-up with Ortho -Consider physical therapy.  Patient declines at this  time.  F/u with PCP as needed in the next few weeks  Grier Mitts, MD

## 2020-09-27 DIAGNOSIS — Z23 Encounter for immunization: Secondary | ICD-10-CM | POA: Diagnosis not present

## 2020-09-29 ENCOUNTER — Other Ambulatory Visit: Payer: Self-pay

## 2020-09-29 ENCOUNTER — Encounter: Payer: Self-pay | Admitting: Family Medicine

## 2020-09-29 ENCOUNTER — Ambulatory Visit (INDEPENDENT_AMBULATORY_CARE_PROVIDER_SITE_OTHER): Payer: Medicare Other | Admitting: Family Medicine

## 2020-09-29 VITALS — BP 100/58 | HR 79 | Temp 97.6°F | Ht <= 58 in

## 2020-09-29 DIAGNOSIS — R600 Localized edema: Secondary | ICD-10-CM

## 2020-09-29 DIAGNOSIS — I10 Essential (primary) hypertension: Secondary | ICD-10-CM

## 2020-09-29 DIAGNOSIS — I4811 Longstanding persistent atrial fibrillation: Secondary | ICD-10-CM

## 2020-09-29 NOTE — Progress Notes (Signed)
Charlene Wade DOB: 09-Oct-1928 Encounter date: 09/29/2020  This is a 86 y.o. female who presents with Chief Complaint  Patient presents with  . Follow-up    History of present illness: [place] on November 3rd. Took a long time to start feeling better.   [place] felt very bad. Can tolerate much better now; about 3/4 way better.   Legs not doing well with swelling; they are seeping. Sleeps in chair and stays in chair 3/4 of day. Takes fluid pill on days she is at home. Does wear stockings at home.       HPI   Allergies  Allergen Reactions  . Levaquin [Levofloxacin] Other (See Comments)    Severe intermittent arm pain  . Ace Inhibitors Cough  . Codeine Other (See Comments)    Unknown reaction - didn't feel well?  . Penicillins Other (See Comments)    Caused high fever Has patient had a PCN reaction causing immediate rash, facial/tongue/throat swelling, SOB or lightheadedness with hypotension: No Has patient had a PCN reaction causing severe rash involving mucus membranes or skin necrosis: No Has patient had a PCN reaction that required hospitalization: No Has patient had a PCN reaction occurring within the last 10 years: No If all of the above answers are "NO", then may proceed with Cephalosporin use.  . Sulfa Antibiotics Other (See Comments)    Possibly caused fever  . Sulfasalazine Other (See Comments)    Possibly caused fever   Current Meds  Medication Sig  . acetaminophen (TYLENOL) 500 MG tablet Take 1 tablet (500 mg total) by mouth 2 (two) times daily as needed for headache (pain).  Marland Kitchen acetaminophen (TYLENOL) 500 MG tablet Take 1 tablet (500 mg total) by mouth every 6 (six) hours as needed.  Marland Kitchen albuterol (VENTOLIN HFA) 108 (90 Base) MCG/ACT inhaler Inhale 2 puffs into the lungs every 6 (six) hours as needed.  Marland Kitchen allopurinol (ZYLOPRIM) 100 MG tablet TAKE 1 TABLET(100 MG) BY MOUTH DAILY  . benzonatate (TESSALON) 100 MG capsule TAKE 1 CAPSULE(100 MG) BY MOUTH THREE TIMES  DAILY AS NEEDED  . calcitonin, salmon, (MIACALCIN/FORTICAL) 200 UNIT/ACT nasal spray   . carvedilol (COREG) 6.25 MG tablet Take 1 tablet (6.25 mg total) by mouth 2 (two) times daily with a meal.  . cetirizine (ZYRTEC) 10 MG tablet Take 10 mg by mouth at bedtime.   . cholecalciferol (VITAMIN D) 1000 units tablet Take 2,000 Units by mouth 2 (two) times daily.  . Cyanocobalamin (VITAMIN B12 PO) Take 1 tablet by mouth daily.   Marland Kitchen denosumab (PROLIA) 60 MG/ML SOLN injection Inject 60 mg into the skin every 6 (six) months. Administer in upper arm, thigh, or abdomen (last injection June or July 2018)  . diclofenac sodium (VOLTAREN) 1 % GEL Apply 1 application topically 4 (four) times daily as needed (arm pain/bursitis).  Marland Kitchen diltiazem (CARDIZEM CD) 240 MG 24 hr capsule TAKE 1 CAPSULE(240 MG) BY MOUTH DAILY  . DULoxetine (CYMBALTA) 20 MG capsule TAKE 1 CAPSULE(20 MG) BY MOUTH DAILY  . ELIQUIS 5 MG TABS tablet TAKE 1 TABLET(5 MG) BY MOUTH TWICE DAILY  . fenofibrate (TRICOR) 145 MG tablet TAKE 1 TABLET(145 MG) BY MOUTH DAILY  . ferrous sulfate 325 (65 FE) MG EC tablet TAKE 1 TABLET(325 MG) BY MOUTH TWICE DAILY BEFORE A MEAL  . fluticasone (FLONASE) 50 MCG/ACT nasal spray INSTILL 1 SPRAY INTO EACH NOSTRIL TWICE DAILY  . furosemide (LASIX) 20 MG tablet TAKE 1 TABLET(20 MG) BY MOUTH DAILY AS NEEDED FOR SWELLING  IN LEGS  . Lidocaine 4 % PTCH Apply 1 patch topically 2 (two) times daily.  Marland Kitchen losartan (COZAAR) 50 MG tablet TAKE 1 TABLET(50 MG) BY MOUTH DAILY  . Multiple Vitamins-Minerals (PRESERVISION AREDS PO) Take 1 tablet by mouth 3 (three) times daily.   Marland Kitchen oxybutynin (DITROPAN-XL) 5 MG 24 hr tablet TAKE 1 TABLET BY MOUTH EVERY DAY  . pantoprazole (PROTONIX) 20 MG tablet TAKE 1 TABLET(20 MG) BY MOUTH DAILY  . Polyethyl Glycol-Propyl Glycol (SYSTANE OP) Place 1 drop into both eyes 2 (two) times daily as needed (dry eyes).  Marland Kitchen Spacer/Aero-Holding Chambers (AEROCHAMBER PLUS) inhaler Use as instructed  . traMADol  (ULTRAM) 50 MG tablet Take 1 tablet (50 mg total) by mouth every 6 (six) hours as needed.    Review of Systems  Constitutional: Negative for chills, fatigue and fever.  Respiratory: Negative for cough, chest tightness, shortness of breath and wheezing.   Cardiovascular: Positive for leg swelling. Negative for chest pain and palpitations.  Musculoskeletal: Positive for back pain (imrproving).    Objective:  BP (!) 100/58 (BP Location: Right Arm, Patient Position: Sitting, Cuff Size: Normal)   Pulse 79   Temp 97.6 F (36.4 C) (Oral)   Ht 4\' 9"  (1.448 m)   BMI 25.71 kg/m       BP Readings from Last 3 Encounters:  09/29/20 (!) 100/58  09/20/20 118/62  09/15/20 (!) 171/89   Wt Readings from Last 3 Encounters:  09/20/20 118 lb 12.8 oz (53.9 kg)  09/15/20 116 lb 10 oz (52.9 kg)  08/29/20 116 lb 11.2 oz (52.9 kg)    Physical Exam Constitutional:      General: She is not in acute distress.    Appearance: She is well-developed.  Cardiovascular:     Rate and Rhythm: Normal rate and regular rhythm.     Heart sounds: Murmur heard.  Crescendo systolic murmur is present with a grade of 2/6.  No friction rub.  Pulmonary:     Effort: Pulmonary effort is normal. No respiratory distress.     Breath sounds: Normal breath sounds. No wheezing or rales.  Musculoskeletal:     Right lower leg: 2+ Edema present.     Left lower leg: 2+ Edema present.  Skin:    Comments: Right medial lower extremity there are 3 open wounds that are draining clear fluid.  Leg is quite wet from these weeping areas.  There is no warmth or erythema noted on exam.  Neurological:     Mental Status: She is alert and oriented to person, place, and time.  Psychiatric:        Behavior: Behavior normal.       Assessment/Plan  1. Bilateral lower extremity edema She did well previously with home health coming to wrap legs.  Even with compression stockings on, they do not seem to be able to keep down swelling.  She  is taking Lasix on a regular basis, although she cannot take it on days when she has to leave the house (like days that she has doctors appointments).  Encouraged elevation whenever sitting or lying (although she does sleep slightly upright in a reclining chair, which makes ideal elevation more difficult. - Ambulatory referral to Home Health  2. Essential hypertension Blood pressure slightly low today.  Encouraged him to check at home since we could decrease medications if running on the lower end.  3. Longstanding persistent atrial fibrillation ([place]) She is rate controlled.  She does follow regularly with cardiology.  Continue current medications including carvedilol 6.25 mg twice daily, Cardizem 240 mg daily.    Return if symptoms worsen or fail to improve.    Micheline Rough, MD

## 2020-10-05 ENCOUNTER — Ambulatory Visit: Payer: Medicare Other

## 2020-10-05 ENCOUNTER — Other Ambulatory Visit: Payer: Medicare Other

## 2020-10-06 DIAGNOSIS — R600 Localized edema: Secondary | ICD-10-CM | POA: Diagnosis not present

## 2020-10-06 DIAGNOSIS — I4811 Longstanding persistent atrial fibrillation: Secondary | ICD-10-CM | POA: Diagnosis not present

## 2020-10-06 DIAGNOSIS — L89892 Pressure ulcer of other site, stage 2: Secondary | ICD-10-CM | POA: Diagnosis not present

## 2020-10-06 DIAGNOSIS — I10 Essential (primary) hypertension: Secondary | ICD-10-CM | POA: Diagnosis not present

## 2020-10-09 DIAGNOSIS — I10 Essential (primary) hypertension: Secondary | ICD-10-CM | POA: Diagnosis not present

## 2020-10-09 DIAGNOSIS — R600 Localized edema: Secondary | ICD-10-CM | POA: Diagnosis not present

## 2020-10-09 DIAGNOSIS — I4811 Longstanding persistent atrial fibrillation: Secondary | ICD-10-CM | POA: Diagnosis not present

## 2020-10-11 ENCOUNTER — Inpatient Hospital Stay: Payer: Medicare Other

## 2020-10-11 ENCOUNTER — Other Ambulatory Visit: Payer: Self-pay

## 2020-10-11 ENCOUNTER — Inpatient Hospital Stay: Payer: Medicare Other | Attending: Oncology

## 2020-10-11 VITALS — BP 114/57 | HR 80 | Resp 18

## 2020-10-11 DIAGNOSIS — D649 Anemia, unspecified: Secondary | ICD-10-CM

## 2020-10-11 DIAGNOSIS — Z79899 Other long term (current) drug therapy: Secondary | ICD-10-CM | POA: Diagnosis not present

## 2020-10-11 DIAGNOSIS — N189 Chronic kidney disease, unspecified: Secondary | ICD-10-CM | POA: Insufficient documentation

## 2020-10-11 DIAGNOSIS — E669 Obesity, unspecified: Secondary | ICD-10-CM | POA: Diagnosis not present

## 2020-10-11 DIAGNOSIS — E611 Iron deficiency: Secondary | ICD-10-CM | POA: Insufficient documentation

## 2020-10-11 DIAGNOSIS — I4891 Unspecified atrial fibrillation: Secondary | ICD-10-CM | POA: Diagnosis not present

## 2020-10-11 DIAGNOSIS — D631 Anemia in chronic kidney disease: Secondary | ICD-10-CM | POA: Insufficient documentation

## 2020-10-11 DIAGNOSIS — Z7901 Long term (current) use of anticoagulants: Secondary | ICD-10-CM | POA: Diagnosis not present

## 2020-10-11 LAB — CBC WITH DIFFERENTIAL (CANCER CENTER ONLY)
Abs Immature Granulocytes: 0.01 10*3/uL (ref 0.00–0.07)
Basophils Absolute: 0 10*3/uL (ref 0.0–0.1)
Basophils Relative: 1 %
Eosinophils Absolute: 0.1 10*3/uL (ref 0.0–0.5)
Eosinophils Relative: 2 %
HCT: 33.8 % — ABNORMAL LOW (ref 36.0–46.0)
Hemoglobin: 10.2 g/dL — ABNORMAL LOW (ref 12.0–15.0)
Immature Granulocytes: 0 %
Lymphocytes Relative: 21 %
Lymphs Abs: 1 10*3/uL (ref 0.7–4.0)
MCH: 28.7 pg (ref 26.0–34.0)
MCHC: 30.2 g/dL (ref 30.0–36.0)
MCV: 94.9 fL (ref 80.0–100.0)
Monocytes Absolute: 0.6 10*3/uL (ref 0.1–1.0)
Monocytes Relative: 13 %
Neutro Abs: 3 10*3/uL (ref 1.7–7.7)
Neutrophils Relative %: 63 %
Platelet Count: 253 10*3/uL (ref 150–400)
RBC: 3.56 MIL/uL — ABNORMAL LOW (ref 3.87–5.11)
RDW: 16.4 % — ABNORMAL HIGH (ref 11.5–15.5)
WBC Count: 4.7 10*3/uL (ref 4.0–10.5)
nRBC: 0 % (ref 0.0–0.2)

## 2020-10-11 MED ORDER — DARBEPOETIN ALFA 300 MCG/0.6ML IJ SOSY
300.0000 ug | PREFILLED_SYRINGE | Freq: Once | INTRAMUSCULAR | Status: AC
  Administered 2020-10-11: 300 ug via SUBCUTANEOUS

## 2020-10-11 MED ORDER — DARBEPOETIN ALFA 300 MCG/0.6ML IJ SOSY
PREFILLED_SYRINGE | INTRAMUSCULAR | Status: AC
  Filled 2020-10-11: qty 0.6

## 2020-10-12 LAB — IRON AND TIBC
Iron: 41 ug/dL (ref 41–142)
Saturation Ratios: 11 % — ABNORMAL LOW (ref 21–57)
TIBC: 376 ug/dL (ref 236–444)
UIBC: 335 ug/dL (ref 120–384)

## 2020-10-12 LAB — FERRITIN: Ferritin: 56 ng/mL (ref 11–307)

## 2020-10-13 DIAGNOSIS — I10 Essential (primary) hypertension: Secondary | ICD-10-CM | POA: Diagnosis not present

## 2020-10-13 DIAGNOSIS — R600 Localized edema: Secondary | ICD-10-CM | POA: Diagnosis not present

## 2020-10-13 DIAGNOSIS — I4811 Longstanding persistent atrial fibrillation: Secondary | ICD-10-CM | POA: Diagnosis not present

## 2020-10-16 DIAGNOSIS — R600 Localized edema: Secondary | ICD-10-CM | POA: Diagnosis not present

## 2020-10-16 DIAGNOSIS — I4811 Longstanding persistent atrial fibrillation: Secondary | ICD-10-CM | POA: Diagnosis not present

## 2020-10-16 DIAGNOSIS — I10 Essential (primary) hypertension: Secondary | ICD-10-CM | POA: Diagnosis not present

## 2020-10-19 DIAGNOSIS — I10 Essential (primary) hypertension: Secondary | ICD-10-CM | POA: Diagnosis not present

## 2020-10-19 DIAGNOSIS — R600 Localized edema: Secondary | ICD-10-CM | POA: Diagnosis not present

## 2020-10-19 DIAGNOSIS — I4811 Longstanding persistent atrial fibrillation: Secondary | ICD-10-CM | POA: Diagnosis not present

## 2020-10-23 DIAGNOSIS — R600 Localized edema: Secondary | ICD-10-CM | POA: Diagnosis not present

## 2020-10-23 DIAGNOSIS — I10 Essential (primary) hypertension: Secondary | ICD-10-CM | POA: Diagnosis not present

## 2020-10-23 DIAGNOSIS — I4811 Longstanding persistent atrial fibrillation: Secondary | ICD-10-CM | POA: Diagnosis not present

## 2020-10-26 DIAGNOSIS — I4811 Longstanding persistent atrial fibrillation: Secondary | ICD-10-CM | POA: Diagnosis not present

## 2020-10-26 DIAGNOSIS — I10 Essential (primary) hypertension: Secondary | ICD-10-CM | POA: Diagnosis not present

## 2020-10-26 DIAGNOSIS — R600 Localized edema: Secondary | ICD-10-CM | POA: Diagnosis not present

## 2020-10-28 ENCOUNTER — Other Ambulatory Visit: Payer: Self-pay | Admitting: Family Medicine

## 2020-10-28 DIAGNOSIS — K219 Gastro-esophageal reflux disease without esophagitis: Secondary | ICD-10-CM

## 2020-10-29 ENCOUNTER — Other Ambulatory Visit: Payer: Self-pay | Admitting: Family Medicine

## 2020-10-31 DIAGNOSIS — R600 Localized edema: Secondary | ICD-10-CM | POA: Diagnosis not present

## 2020-10-31 DIAGNOSIS — I4811 Longstanding persistent atrial fibrillation: Secondary | ICD-10-CM | POA: Diagnosis not present

## 2020-10-31 DIAGNOSIS — I10 Essential (primary) hypertension: Secondary | ICD-10-CM | POA: Diagnosis not present

## 2020-11-03 DIAGNOSIS — R600 Localized edema: Secondary | ICD-10-CM | POA: Diagnosis not present

## 2020-11-03 DIAGNOSIS — I10 Essential (primary) hypertension: Secondary | ICD-10-CM | POA: Diagnosis not present

## 2020-11-03 DIAGNOSIS — I4811 Longstanding persistent atrial fibrillation: Secondary | ICD-10-CM | POA: Diagnosis not present

## 2020-11-05 DIAGNOSIS — I10 Essential (primary) hypertension: Secondary | ICD-10-CM | POA: Diagnosis not present

## 2020-11-05 DIAGNOSIS — I4811 Longstanding persistent atrial fibrillation: Secondary | ICD-10-CM | POA: Diagnosis not present

## 2020-11-05 DIAGNOSIS — R600 Localized edema: Secondary | ICD-10-CM | POA: Diagnosis not present

## 2020-11-06 DIAGNOSIS — M25552 Pain in left hip: Secondary | ICD-10-CM | POA: Diagnosis not present

## 2020-11-06 DIAGNOSIS — R600 Localized edema: Secondary | ICD-10-CM | POA: Diagnosis not present

## 2020-11-06 DIAGNOSIS — I10 Essential (primary) hypertension: Secondary | ICD-10-CM | POA: Diagnosis not present

## 2020-11-06 DIAGNOSIS — I4811 Longstanding persistent atrial fibrillation: Secondary | ICD-10-CM | POA: Diagnosis not present

## 2020-11-06 DIAGNOSIS — M7062 Trochanteric bursitis, left hip: Secondary | ICD-10-CM | POA: Diagnosis not present

## 2020-11-08 ENCOUNTER — Other Ambulatory Visit: Payer: Self-pay

## 2020-11-08 ENCOUNTER — Inpatient Hospital Stay: Payer: Medicare Other

## 2020-11-08 ENCOUNTER — Inpatient Hospital Stay: Payer: Medicare Other | Attending: Oncology

## 2020-11-08 VITALS — BP 120/65 | HR 84 | Resp 19

## 2020-11-08 DIAGNOSIS — D631 Anemia in chronic kidney disease: Secondary | ICD-10-CM | POA: Diagnosis not present

## 2020-11-08 DIAGNOSIS — E669 Obesity, unspecified: Secondary | ICD-10-CM | POA: Diagnosis not present

## 2020-11-08 DIAGNOSIS — D649 Anemia, unspecified: Secondary | ICD-10-CM

## 2020-11-08 DIAGNOSIS — Z79899 Other long term (current) drug therapy: Secondary | ICD-10-CM | POA: Insufficient documentation

## 2020-11-08 DIAGNOSIS — Z7901 Long term (current) use of anticoagulants: Secondary | ICD-10-CM | POA: Diagnosis not present

## 2020-11-08 DIAGNOSIS — I4891 Unspecified atrial fibrillation: Secondary | ICD-10-CM | POA: Diagnosis not present

## 2020-11-08 DIAGNOSIS — N189 Chronic kidney disease, unspecified: Secondary | ICD-10-CM | POA: Diagnosis not present

## 2020-11-08 DIAGNOSIS — E611 Iron deficiency: Secondary | ICD-10-CM | POA: Diagnosis not present

## 2020-11-08 LAB — CBC WITH DIFFERENTIAL (CANCER CENTER ONLY)
Abs Immature Granulocytes: 0.01 10*3/uL (ref 0.00–0.07)
Basophils Absolute: 0 10*3/uL (ref 0.0–0.1)
Basophils Relative: 1 %
Eosinophils Absolute: 0 10*3/uL (ref 0.0–0.5)
Eosinophils Relative: 1 %
HCT: 36.2 % (ref 36.0–46.0)
Hemoglobin: 10.9 g/dL — ABNORMAL LOW (ref 12.0–15.0)
Immature Granulocytes: 0 %
Lymphocytes Relative: 18 %
Lymphs Abs: 0.9 10*3/uL (ref 0.7–4.0)
MCH: 28.2 pg (ref 26.0–34.0)
MCHC: 30.1 g/dL (ref 30.0–36.0)
MCV: 93.5 fL (ref 80.0–100.0)
Monocytes Absolute: 0.4 10*3/uL (ref 0.1–1.0)
Monocytes Relative: 7 %
Neutro Abs: 3.5 10*3/uL (ref 1.7–7.7)
Neutrophils Relative %: 73 %
Platelet Count: 269 10*3/uL (ref 150–400)
RBC: 3.87 MIL/uL (ref 3.87–5.11)
RDW: 17.2 % — ABNORMAL HIGH (ref 11.5–15.5)
WBC Count: 4.8 10*3/uL (ref 4.0–10.5)
nRBC: 0 % (ref 0.0–0.2)

## 2020-11-08 MED ORDER — DARBEPOETIN ALFA 300 MCG/0.6ML IJ SOSY
300.0000 ug | PREFILLED_SYRINGE | Freq: Once | INTRAMUSCULAR | Status: AC
  Administered 2020-11-08: 300 ug via SUBCUTANEOUS

## 2020-11-08 MED ORDER — DARBEPOETIN ALFA 300 MCG/0.6ML IJ SOSY
PREFILLED_SYRINGE | INTRAMUSCULAR | Status: AC
  Filled 2020-11-08: qty 0.6

## 2020-11-08 NOTE — Patient Instructions (Signed)
Darbepoetin Alfa injection What is this medicine? DARBEPOETIN ALFA (dar be POE e tin AL fa) helps your body make more red blood cells. It is used to treat anemia caused by chronic kidney failure and chemotherapy. This medicine may be used for other purposes; ask your health care provider or pharmacist if you have questions. COMMON BRAND NAME(S): Aranesp What should I tell my health care provider before I take this medicine? They need to know if you have any of these conditions:  blood clotting disorders or history of blood clots  cancer patient not on chemotherapy  cystic fibrosis  heart disease, such as angina, heart failure, or a history of a heart attack  hemoglobin level of 12 g/dL or greater  high blood pressure  low levels of folate, iron, or vitamin B12  seizures  an unusual or allergic reaction to darbepoetin, erythropoietin, albumin, hamster proteins, latex, other medicines, foods, dyes, or preservatives  pregnant or trying to get pregnant  breast-feeding How should I use this medicine? This medicine is for injection into a vein or under the skin. It is usually given by a health care professional in a hospital or clinic setting. If you get this medicine at home, you will be taught how to prepare and give this medicine. Use exactly as directed. Take your medicine at regular intervals. Do not take your medicine more often than directed. It is important that you put your used needles and syringes in a special sharps container. Do not put them in a trash can. If you do not have a sharps container, call your pharmacist or healthcare provider to get one. A special MedGuide will be given to you by the pharmacist with each prescription and refill. Be sure to read this information carefully each time. Talk to your pediatrician regarding the use of this medicine in children. While this medicine may be used in children as young as 1 month of age for selected conditions, precautions do  apply. Overdosage: If you think you have taken too much of this medicine contact a poison control center or emergency room at once. NOTE: This medicine is only for you. Do not share this medicine with others. What if I miss a dose? If you miss a dose, take it as soon as you can. If it is almost time for your next dose, take only that dose. Do not take double or extra doses. What may interact with this medicine? Do not take this medicine with any of the following medications:  epoetin alfa This list may not describe all possible interactions. Give your health care provider a list of all the medicines, herbs, non-prescription drugs, or dietary supplements you use. Also tell them if you smoke, drink alcohol, or use illegal drugs. Some items may interact with your medicine. What should I watch for while using this medicine? Your condition will be monitored carefully while you are receiving this medicine. You may need blood work done while you are taking this medicine. This medicine may cause a decrease in vitamin B6. You should make sure that you get enough vitamin B6 while you are taking this medicine. Discuss the foods you eat and the vitamins you take with your health care professional. What side effects may I notice from receiving this medicine? Side effects that you should report to your doctor or health care professional as soon as possible:  allergic reactions like skin rash, itching or hives, swelling of the face, lips, or tongue  breathing problems  changes in   vision  chest pain  confusion, trouble speaking or understanding  feeling faint or lightheaded, falls  high blood pressure  muscle aches or pains  pain, swelling, warmth in the leg  rapid weight gain  severe headaches  sudden numbness or weakness of the face, arm or leg  trouble walking, dizziness, loss of balance or coordination  seizures (convulsions)  swelling of the ankles, feet, hands  unusually weak or  tired Side effects that usually do not require medical attention (report to your doctor or health care professional if they continue or are bothersome):  diarrhea  fever, chills (flu-like symptoms)  headaches  nausea, vomiting  redness, stinging, or swelling at site where injected This list may not describe all possible side effects. Call your doctor for medical advice about side effects. You may report side effects to FDA at 1-800-FDA-1088. Where should I keep my medicine? Keep out of the reach of children. Store in a refrigerator between 2 and 8 degrees C (36 and 46 degrees F). Do not freeze. Do not shake. Throw away any unused portion if using a single-dose vial. Throw away any unused medicine after the expiration date. NOTE: This sheet is a summary. It may not cover all possible information. If you have questions about this medicine, talk to your doctor, pharmacist, or health care provider.  2021 Elsevier/Gold Standard (2017-10-29 16:44:20)  

## 2020-11-09 ENCOUNTER — Other Ambulatory Visit: Payer: Medicare Other

## 2020-11-09 ENCOUNTER — Ambulatory Visit: Payer: Medicare Other

## 2020-11-09 LAB — IRON AND TIBC
Iron: 49 ug/dL (ref 41–142)
Saturation Ratios: 12 % — ABNORMAL LOW (ref 21–57)
TIBC: 411 ug/dL (ref 236–444)
UIBC: 363 ug/dL (ref 120–384)

## 2020-11-09 LAB — FERRITIN: Ferritin: 70 ng/mL (ref 11–307)

## 2020-11-10 ENCOUNTER — Other Ambulatory Visit: Payer: Self-pay | Admitting: Family Medicine

## 2020-11-10 DIAGNOSIS — I10 Essential (primary) hypertension: Secondary | ICD-10-CM

## 2020-11-10 DIAGNOSIS — I4811 Longstanding persistent atrial fibrillation: Secondary | ICD-10-CM | POA: Diagnosis not present

## 2020-11-10 DIAGNOSIS — R600 Localized edema: Secondary | ICD-10-CM | POA: Diagnosis not present

## 2020-11-11 ENCOUNTER — Other Ambulatory Visit: Payer: Self-pay | Admitting: Family Medicine

## 2020-11-11 DIAGNOSIS — I10 Essential (primary) hypertension: Secondary | ICD-10-CM

## 2020-11-14 ENCOUNTER — Telehealth: Payer: Self-pay | Admitting: Family Medicine

## 2020-11-14 DIAGNOSIS — I4811 Longstanding persistent atrial fibrillation: Secondary | ICD-10-CM | POA: Diagnosis not present

## 2020-11-14 DIAGNOSIS — I10 Essential (primary) hypertension: Secondary | ICD-10-CM | POA: Diagnosis not present

## 2020-11-14 DIAGNOSIS — R600 Localized edema: Secondary | ICD-10-CM | POA: Diagnosis not present

## 2020-11-14 NOTE — Telephone Encounter (Signed)
Charlene Wade is calling and stated that patient has had increased edema and wanted to see if provider could increase patients lasix or does the patient need to come in for an appointment, please advise. CB is [phone].

## 2020-11-15 ENCOUNTER — Encounter (INDEPENDENT_AMBULATORY_CARE_PROVIDER_SITE_OTHER): Payer: Medicare Other | Admitting: Ophthalmology

## 2020-11-15 NOTE — Telephone Encounter (Signed)
Spoke with Charlene Wade and informed her of the orders as below.

## 2020-11-15 NOTE — Telephone Encounter (Signed)
Increase Lasix 20 mg to 1 twice daily for 3 days then back down to 20 mg daily.  If not improved with that, recommend office follow-up with primary

## 2020-11-17 DIAGNOSIS — R600 Localized edema: Secondary | ICD-10-CM | POA: Diagnosis not present

## 2020-11-17 DIAGNOSIS — I4811 Longstanding persistent atrial fibrillation: Secondary | ICD-10-CM | POA: Diagnosis not present

## 2020-11-17 DIAGNOSIS — I10 Essential (primary) hypertension: Secondary | ICD-10-CM | POA: Diagnosis not present

## 2020-11-20 ENCOUNTER — Telehealth: Payer: Self-pay | Admitting: Family Medicine

## 2020-11-20 DIAGNOSIS — I4811 Longstanding persistent atrial fibrillation: Secondary | ICD-10-CM | POA: Diagnosis not present

## 2020-11-20 DIAGNOSIS — R600 Localized edema: Secondary | ICD-10-CM | POA: Diagnosis not present

## 2020-11-20 DIAGNOSIS — I10 Essential (primary) hypertension: Secondary | ICD-10-CM | POA: Diagnosis not present

## 2020-11-20 NOTE — Telephone Encounter (Signed)
Left a message with the receptionist at [facility] for [facility] to return a call to the office with more information as below.

## 2020-11-20 NOTE — Telephone Encounter (Signed)
Could we get details on wound? When she was here last it was very small blisters that had opened, so I would like to know size and appearance of wound now. Happy to fit her in for visit or virtual if she would like.  Usually I prefer to see it before starting antibiotics (even if we are just seeing virtual) but if that is not possible I would be ok with keflex 500mg  PO TID x 5 days with wound check after that. Keep elevated to keep swelling down and promote healing.

## 2020-11-20 NOTE — Telephone Encounter (Signed)
Megan with [facility] is calling stating pt wound on right leg she thinks its infected and wants to know if Dr. Ethlyn Gallery wants to see her and will call something in. Please call Caregiver [phone].

## 2020-11-21 MED ORDER — CEPHALEXIN 500 MG PO CAPS: 500.0000 mg | ORAL_CAPSULE | Freq: Three times a day (TID) | ORAL | 0 refills | Status: DC

## 2020-11-21 NOTE — Telephone Encounter (Signed)
keflex 500mg  PO TID x 5 days. Please use triple antibiotic ointment on wounds and then cover with gauze and compression wrap with ace bandage if she will tolerate (or can wear compression stockings if available). If worsening needs visit.

## 2020-11-21 NOTE — Telephone Encounter (Signed)
Per Jinny Blossom  [phone]) she went to the pts home yesterday, noticed increased clear drainage "like a faucet", macerated, little lesions on the leg, painful to the touch, denies a fever, no odor and questioned what is recommended. Message sent to PCP.

## 2020-11-21 NOTE — Telephone Encounter (Signed)
Spoke with Charlene Wade, informed her of the message below and the Rx was sent to [facility].

## 2020-11-21 NOTE — Telephone Encounter (Signed)
Left a message with the receptionist at [facility] [phone]) for Charlene Wade to return a call as below.

## 2020-11-24 DIAGNOSIS — I4811 Longstanding persistent atrial fibrillation: Secondary | ICD-10-CM | POA: Diagnosis not present

## 2020-11-24 DIAGNOSIS — R600 Localized edema: Secondary | ICD-10-CM | POA: Diagnosis not present

## 2020-11-24 DIAGNOSIS — I10 Essential (primary) hypertension: Secondary | ICD-10-CM | POA: Diagnosis not present

## 2020-11-27 ENCOUNTER — Telehealth: Payer: Self-pay | Admitting: Family Medicine

## 2020-11-27 DIAGNOSIS — R600 Localized edema: Secondary | ICD-10-CM | POA: Diagnosis not present

## 2020-11-27 DIAGNOSIS — I4811 Longstanding persistent atrial fibrillation: Secondary | ICD-10-CM | POA: Diagnosis not present

## 2020-11-27 DIAGNOSIS — I10 Essential (primary) hypertension: Secondary | ICD-10-CM | POA: Diagnosis not present

## 2020-11-27 NOTE — Telephone Encounter (Signed)
Pt caregiver Marine scientist) is calling in stating that the pt is in a lot of pain and her wounds if not healing with the medication that she has been given.  Pt would like to be seen today but Dr. Ethlyn Gallery does not have anything and pt stated that she would like to see Dr. Ethlyn Gallery if not please advise her as what she needs to do.

## 2020-11-27 NOTE — Telephone Encounter (Signed)
Unfortunately I am unable to stay later today; please see if any other providers have an opening? I could fit her in on weds at noon; but I don't want her to wait 2 days if in pain and we are concerned about infection.

## 2020-11-27 NOTE — Telephone Encounter (Signed)
Spoke with the pts caregiver, informed her of the message below and offered an appt for tomorrow as there are no openings available today.  Appt scheduled with Dr Elease Hashimoto on 2/1 to arrive at 1:45pm.

## 2020-11-28 ENCOUNTER — Other Ambulatory Visit: Payer: Self-pay

## 2020-11-28 ENCOUNTER — Encounter: Payer: Self-pay | Admitting: Family Medicine

## 2020-11-28 ENCOUNTER — Ambulatory Visit (INDEPENDENT_AMBULATORY_CARE_PROVIDER_SITE_OTHER): Payer: Medicare Other | Admitting: Family Medicine

## 2020-11-28 VITALS — BP 128/64 | Ht <= 58 in | Wt 106.0 lb

## 2020-11-28 DIAGNOSIS — M792 Neuralgia and neuritis, unspecified: Secondary | ICD-10-CM

## 2020-11-28 DIAGNOSIS — R600 Localized edema: Secondary | ICD-10-CM

## 2020-11-28 DIAGNOSIS — S81801D Unspecified open wound, right lower leg, subsequent encounter: Secondary | ICD-10-CM

## 2020-11-28 MED ORDER — GABAPENTIN 100 MG PO CAPS: 100.0000 mg | ORAL_CAPSULE | Freq: Two times a day (BID) | ORAL | 1 refills | Status: DC

## 2020-11-28 NOTE — Progress Notes (Signed)
Established Patient Office Visit  Subjective:  Patient ID: Charlene Wade, female    DOB: 16-May-1928  Age: 87 y.o. MRN: [id number]  CC:  Chief Complaint  Patient presents with  . Leg Pain    HPI Charlene Wade presents for right leg pain and poorly healing wounds right leg.  She has longstanding history of bilateral leg edema and does take diuretic for that.  She is not compliant with elevating legs.  They have home health coming out to evaluate right leg wound and to currently doing compression wraps.  They have used some triple antibiotic ointment and prior to that tried [facility] without much improvement.  She does take boost supplement to try to help with protein intake and takes vitamin C supplement.  She apparently just finished antibiotic for some possible cellulitis of the right leg.  She has frequent bruising.  Her biggest complaint today is burning sensation involving the right lower leg.  This has been very bothersome to her at times.  Moderate to severe.  No low back pain.  Tried Tylenol without relief.  Past Medical History:  Diagnosis Date  . Allergic rhinitis   . Atrial fibrillation ([place]) 09/26/2017   New-onset A. Fib now persistent  . BCC (basal cell carcinoma of skin) 04/10/2015   sees dermatologist  . CKD (chronic kidney disease) stage 3, GFR 30-59 ml/min ([place]) 04/26/2015  . Depression 10/17/2012  . Ectopic pregnancy   . GERD (gastroesophageal reflux disease)    hx hiatal hernia, has seen GI, ENT and allergist in the past  . Hyperlipidemia   . Hypertension   . OA (osteoarthritis)    knees and back, hx DDD, s/p remote lumbar disc surgery  . OAB (overactive bladder) 04/10/2015  . Obesity   . Osteoporosis    on prolia in the past  . Stroke [facility])    sees neurologist, hx memory loss, expressive aphasia  . Venous insufficiency     Past Surgical History:  Procedure Laterality Date  . ABDOMINAL HYSTERECTOMY    . APPENDECTOMY    . BREAST REDUCTION SURGERY     . LAPAROSCOPIC SALPINGOOPHERECTOMY    . NASAL MASS EXCISION     Basal Cell Cancer Excision  . NASAL RECONSTRUCTION    . TEE WITHOUT CARDIOVERSION  10/22/2012   Procedure: TRANSESOPHAGEAL ECHOCARDIOGRAM (TEE);  Surgeon: Josue Hector, MD;  Location: [facility] ENDOSCOPY;  Service: Cardiovascular;  Laterality: N/A;    Family History  Problem Relation Age of Onset  . Hypertension Father   . Hypertension Mother   . Cancer - Lung Brother     Social History   Socioeconomic History  . Marital status: Widowed    Spouse name: Not on file  . Number of children: 2  . Years of education: 57  . Highest education level: Not on file  Occupational History  . Occupation: retired  Tobacco Use  . Smoking status: Former Research scientist (life sciences)  . Smokeless tobacco: Never Used  Vaping Use  . Vaping Use: Never used  Substance and Sexual Activity  . Alcohol use: No  . Drug use: No  . Sexual activity: Never  Other Topics Concern  . Not on file  Social History Narrative   Patient is single with 2 children.   Patient is right handed.   Patient has 12 th grade education.   Patient does not drink caffeine.   Social Determinants of Health   Financial Resource Strain: Not on file  Food Insecurity: Not on  file  Transportation Needs: Not on file  Physical Activity: Not on file  Stress: Not on file  Social Connections: Not on file  Intimate Partner Violence: Not on file    Outpatient Medications Prior to Visit  Medication Sig Dispense Refill  . acetaminophen (TYLENOL) 500 MG tablet Take 1 tablet (500 mg total) by mouth 2 (two) times daily as needed for headache (pain). 20 tablet 0  . acetaminophen (TYLENOL) 500 MG tablet Take 1 tablet (500 mg total) by mouth every 6 (six) hours as needed. 30 tablet 0  . albuterol (VENTOLIN HFA) 108 (90 Base) MCG/ACT inhaler Inhale 2 puffs into the lungs every 6 (six) hours as needed. 1 each 1  . allopurinol (ZYLOPRIM) 100 MG tablet TAKE 1 TABLET(100 MG) BY MOUTH DAILY 90 tablet  1  . benzonatate (TESSALON) 100 MG capsule TAKE 1 CAPSULE(100 MG) BY MOUTH THREE TIMES DAILY AS NEEDED 30 capsule 1  . calcitonin, salmon, (MIACALCIN/FORTICAL) 200 UNIT/ACT nasal spray   0  . carvedilol (COREG) 6.25 MG tablet Take 1 tablet (6.25 mg total) by mouth 2 (two) times daily with a meal. 180 tablet 3  . cephALEXin (KEFLEX) 500 MG capsule Take 1 capsule (500 mg total) by mouth 3 (three) times daily. 15 capsule 0  . cetirizine (ZYRTEC) 10 MG tablet Take 10 mg by mouth at bedtime.     . cholecalciferol (VITAMIN D) 1000 units tablet Take 2,000 Units by mouth 2 (two) times daily.    . Cyanocobalamin (VITAMIN B12 PO) Take 1 tablet by mouth daily.     Marland Kitchen denosumab (PROLIA) 60 MG/ML SOLN injection Inject 60 mg into the skin every 6 (six) months. Administer in upper arm, thigh, or abdomen (last injection June or July 2018)    . diclofenac sodium (VOLTAREN) 1 % GEL Apply 1 application topically 4 (four) times daily as needed (arm pain/bursitis).    Marland Kitchen diltiazem (CARDIZEM CD) 240 MG 24 hr capsule TAKE 1 CAPSULE(240 MG) BY MOUTH DAILY 30 capsule 3  . DULoxetine (CYMBALTA) 20 MG capsule TAKE 1 CAPSULE(20 MG) BY MOUTH DAILY 90 capsule 1  . ELIQUIS 5 MG TABS tablet TAKE 1 TABLET(5 MG) BY MOUTH TWICE DAILY 180 tablet 3  . fenofibrate (TRICOR) 145 MG tablet TAKE 1 TABLET(145 MG) BY MOUTH DAILY 90 tablet 1  . ferrous sulfate 325 (65 FE) MG EC tablet TAKE 1 TABLET(325 MG) BY MOUTH TWICE DAILY BEFORE A MEAL 60 tablet 3  . fluticasone (FLONASE) 50 MCG/ACT nasal spray SHAKE LIQUID AND USE 1 SPRAY IN EACH NOSTRIL TWICE DAILY 48 g 4  . furosemide (LASIX) 20 MG tablet TAKE 1 TABLET(20 MG) BY MOUTH DAILY AS NEEDED FOR SWELLING IN LEGS 30 tablet 0  . Lidocaine 4 % PTCH Apply 1 patch topically 2 (two) times daily. 12 patch 0  . losartan (COZAAR) 50 MG tablet TAKE 1 TABLET(50 MG) BY MOUTH DAILY 90 tablet 0  . Multiple Vitamins-Minerals (PRESERVISION AREDS PO) Take 1 tablet by mouth 3 (three) times daily.     Marland Kitchen  oxybutynin (DITROPAN-XL) 5 MG 24 hr tablet TAKE 1 TABLET BY MOUTH EVERY DAY 90 tablet 1  . pantoprazole (PROTONIX) 20 MG tablet TAKE 1 TABLET(20 MG) BY MOUTH DAILY 90 tablet 0  . Polyethyl Glycol-Propyl Glycol (SYSTANE OP) Place 1 drop into both eyes 2 (two) times daily as needed (dry eyes).    Marland Kitchen Spacer/Aero-Holding Chambers (AEROCHAMBER PLUS) inhaler Use as instructed 1 each 0  . traMADol (ULTRAM) 50 MG tablet Take 1  tablet (50 mg total) by mouth every 6 (six) hours as needed. 30 tablet 0  . fluticasone (FLOVENT HFA) 44 MCG/ACT inhaler Inhale 2 puffs into the lungs 2 (two) times daily for 14 days. 1 Inhaler 0   No facility-administered medications prior to visit.    Allergies  Allergen Reactions  . Levaquin [Levofloxacin] Other (See Comments)    Severe intermittent arm pain  . Ace Inhibitors Cough  . Codeine Other (See Comments)    Unknown reaction - didn't feel well?  . Penicillins Other (See Comments)    Caused high fever Has patient had a PCN reaction causing immediate rash, facial/tongue/throat swelling, SOB or lightheadedness with hypotension: No Has patient had a PCN reaction causing severe rash involving mucus membranes or skin necrosis: No Has patient had a PCN reaction that required hospitalization: No Has patient had a PCN reaction occurring within the last 10 years: No If all of the above answers are "NO", then may proceed with Cephalosporin use.  . Sulfa Antibiotics Other (See Comments)    Possibly caused fever  . Sulfasalazine Other (See Comments)    Possibly caused fever    ROS Review of Systems  Constitutional: Negative for chills and fever.  Respiratory: Negative for shortness of breath.   Cardiovascular: Positive for leg swelling. Negative for chest pain.  Psychiatric/Behavioral: Negative for confusion.      Objective:    Physical Exam Vitals reviewed.  Constitutional:      Appearance: Normal appearance.  Cardiovascular:     Rate and Rhythm: Normal  rate.  Skin:    Comments: She has some edema both legs.  Right leg reveals what appear to be some petechia on the dorsum of the foot.  She has some superficial broken down areas involving the right lower leg anteriorly and medially.  Excellent capillary refill.  No cellulitis changes.  Neurological:     Mental Status: She is alert.  Psychiatric:        Mood and Affect: Mood normal.     BP 128/64   Ht 4\' 9"  (1.448 m)   Wt 106 lb (48.1 kg)   BMI 22.94 kg/m  Wt Readings from Last 3 Encounters:  11/28/20 106 lb (48.1 kg)  09/20/20 118 lb 12.8 oz (53.9 kg)  09/15/20 116 lb 10 oz (52.9 kg)     There are no preventive care reminders to display for this patient.  There are no preventive care reminders to display for this patient.  Lab Results  Component Value Date   TSH 1.38 03/01/2020   Lab Results  Component Value Date   WBC 4.8 11/08/2020   HGB 10.9 (L) 11/08/2020   HCT 36.2 11/08/2020   MCV 93.5 11/08/2020   PLT 269 11/08/2020   Lab Results  Component Value Date   NA 139 08/02/2020   K 5.0 08/02/2020   CO2 22 08/02/2020   GLUCOSE 138 (H) 08/02/2020   BUN 27 (H) 08/02/2020   CREATININE 1.87 (H) 08/02/2020   BILITOT 0.5 08/02/2020   ALKPHOS 42 03/01/2020   AST 26 08/02/2020   ALT 7 08/02/2020   PROT 5.7 (L) 08/02/2020   ALBUMIN 3.5 03/01/2020   CALCIUM 8.7 08/02/2020   ANIONGAP 9 02/26/2019   GFR 28.03 (L) 03/28/2020   Lab Results  Component Value Date   CHOL 104 03/01/2020   Lab Results  Component Value Date   HDL 37.80 (L) 03/01/2020   Lab Results  Component Value Date   LDLCALC 46 03/01/2020  Lab Results  Component Value Date   TRIG 102.0 03/01/2020   Lab Results  Component Value Date   CHOLHDL 3 03/01/2020   Lab Results  Component Value Date   HGBA1C 6.0 06/16/2018      Assessment & Plan:   Patient presents with chronic bilateral lower extremity edema with some skin breakdown on the right side without any acute complication such as  cellulitis.  She does describe severe burning type pain involving her right leg.  This sounds more neuropathic.  -Stressed importance of frequent leg elevation -Consider trial of low-dose gabapentin 100 mg twice daily.  Would use low-dose to avoid exacerbating edema and also with her age -We discussed with primary possible strategies to help facilitate healing of wound.  We certainly agree with them having continued compression wraps to help control edema.  Unfortunately, she spends most of her day sitting around with her legs dependent  Meds ordered this encounter  Medications  . gabapentin (NEURONTIN) 100 MG capsule    Sig: Take 1 capsule (100 mg total) by mouth 2 (two) times daily.    Dispense:  60 capsule    Refill:  1    Follow-up: No follow-ups on file.    Carolann Littler, MD

## 2020-11-28 NOTE — Patient Instructions (Signed)
Elevate leg frequently!  Take the Gabapentin one twice daily- this might cause some mild sedation  We will consult with home health nurse regarding topical therapies.

## 2020-12-01 DIAGNOSIS — I4811 Longstanding persistent atrial fibrillation: Secondary | ICD-10-CM | POA: Diagnosis not present

## 2020-12-01 DIAGNOSIS — R600 Localized edema: Secondary | ICD-10-CM | POA: Diagnosis not present

## 2020-12-01 DIAGNOSIS — I10 Essential (primary) hypertension: Secondary | ICD-10-CM | POA: Diagnosis not present

## 2020-12-04 DIAGNOSIS — R600 Localized edema: Secondary | ICD-10-CM | POA: Diagnosis not present

## 2020-12-04 DIAGNOSIS — I4811 Longstanding persistent atrial fibrillation: Secondary | ICD-10-CM | POA: Diagnosis not present

## 2020-12-04 DIAGNOSIS — I10 Essential (primary) hypertension: Secondary | ICD-10-CM | POA: Diagnosis not present

## 2020-12-05 ENCOUNTER — Other Ambulatory Visit: Payer: Self-pay | Admitting: Family Medicine

## 2020-12-05 DIAGNOSIS — I872 Venous insufficiency (chronic) (peripheral): Secondary | ICD-10-CM | POA: Diagnosis not present

## 2020-12-05 DIAGNOSIS — I10 Essential (primary) hypertension: Secondary | ICD-10-CM | POA: Diagnosis not present

## 2020-12-05 DIAGNOSIS — Z7901 Long term (current) use of anticoagulants: Secondary | ICD-10-CM | POA: Diagnosis not present

## 2020-12-05 DIAGNOSIS — Z4800 Encounter for change or removal of nonsurgical wound dressing: Secondary | ICD-10-CM | POA: Diagnosis not present

## 2020-12-05 DIAGNOSIS — R238 Other skin changes: Secondary | ICD-10-CM | POA: Diagnosis not present

## 2020-12-05 DIAGNOSIS — L97819 Non-pressure chronic ulcer of other part of right lower leg with unspecified severity: Secondary | ICD-10-CM | POA: Diagnosis not present

## 2020-12-05 DIAGNOSIS — R600 Localized edema: Secondary | ICD-10-CM | POA: Diagnosis not present

## 2020-12-05 DIAGNOSIS — I4811 Longstanding persistent atrial fibrillation: Secondary | ICD-10-CM | POA: Diagnosis not present

## 2020-12-06 ENCOUNTER — Inpatient Hospital Stay: Payer: Medicare Other

## 2020-12-06 ENCOUNTER — Inpatient Hospital Stay: Payer: Medicare Other | Attending: Oncology

## 2020-12-06 ENCOUNTER — Other Ambulatory Visit: Payer: Self-pay

## 2020-12-06 VITALS — BP 126/78 | HR 78 | Temp 98.2°F | Resp 18

## 2020-12-06 DIAGNOSIS — E611 Iron deficiency: Secondary | ICD-10-CM | POA: Diagnosis not present

## 2020-12-06 DIAGNOSIS — I4891 Unspecified atrial fibrillation: Secondary | ICD-10-CM | POA: Insufficient documentation

## 2020-12-06 DIAGNOSIS — D631 Anemia in chronic kidney disease: Secondary | ICD-10-CM | POA: Diagnosis not present

## 2020-12-06 DIAGNOSIS — Z79899 Other long term (current) drug therapy: Secondary | ICD-10-CM | POA: Diagnosis not present

## 2020-12-06 DIAGNOSIS — N189 Chronic kidney disease, unspecified: Secondary | ICD-10-CM | POA: Diagnosis not present

## 2020-12-06 DIAGNOSIS — D649 Anemia, unspecified: Secondary | ICD-10-CM

## 2020-12-06 DIAGNOSIS — E669 Obesity, unspecified: Secondary | ICD-10-CM | POA: Diagnosis not present

## 2020-12-06 DIAGNOSIS — Z7901 Long term (current) use of anticoagulants: Secondary | ICD-10-CM | POA: Insufficient documentation

## 2020-12-06 LAB — CBC WITH DIFFERENTIAL (CANCER CENTER ONLY)
Abs Immature Granulocytes: 0.01 10*3/uL (ref 0.00–0.07)
Basophils Absolute: 0 10*3/uL (ref 0.0–0.1)
Basophils Relative: 1 %
Eosinophils Absolute: 0.1 10*3/uL (ref 0.0–0.5)
Eosinophils Relative: 2 %
HCT: 35 % — ABNORMAL LOW (ref 36.0–46.0)
Hemoglobin: 10.1 g/dL — ABNORMAL LOW (ref 12.0–15.0)
Immature Granulocytes: 0 %
Lymphocytes Relative: 15 %
Lymphs Abs: 0.6 10*3/uL — ABNORMAL LOW (ref 0.7–4.0)
MCH: 27.9 pg (ref 26.0–34.0)
MCHC: 28.9 g/dL — ABNORMAL LOW (ref 30.0–36.0)
MCV: 96.7 fL (ref 80.0–100.0)
Monocytes Absolute: 0.4 10*3/uL (ref 0.1–1.0)
Monocytes Relative: 10 %
Neutro Abs: 2.9 10*3/uL (ref 1.7–7.7)
Neutrophils Relative %: 72 %
Platelet Count: 228 10*3/uL (ref 150–400)
RBC: 3.62 MIL/uL — ABNORMAL LOW (ref 3.87–5.11)
RDW: 17.9 % — ABNORMAL HIGH (ref 11.5–15.5)
WBC Count: 4 10*3/uL (ref 4.0–10.5)
nRBC: 0 % (ref 0.0–0.2)

## 2020-12-06 MED ORDER — DARBEPOETIN ALFA 300 MCG/0.6ML IJ SOSY
300.0000 ug | PREFILLED_SYRINGE | Freq: Once | INTRAMUSCULAR | Status: AC
  Administered 2020-12-06: 300 ug via SUBCUTANEOUS

## 2020-12-06 NOTE — Patient Instructions (Signed)
Darbepoetin Alfa injection What is this medicine? DARBEPOETIN ALFA (dar be POE e tin AL fa) helps your body make more red blood cells. It is used to treat anemia caused by chronic kidney failure and chemotherapy. This medicine may be used for other purposes; ask your health care provider or pharmacist if you have questions. COMMON BRAND NAME(S): Aranesp What should I tell my health care provider before I take this medicine? They need to know if you have any of these conditions:  blood clotting disorders or history of blood clots  cancer patient not on chemotherapy  cystic fibrosis  heart disease, such as angina, heart failure, or a history of a heart attack  hemoglobin level of 12 g/dL or greater  high blood pressure  low levels of folate, iron, or vitamin B12  seizures  an unusual or allergic reaction to darbepoetin, erythropoietin, albumin, hamster proteins, latex, other medicines, foods, dyes, or preservatives  pregnant or trying to get pregnant  breast-feeding How should I use this medicine? This medicine is for injection into a vein or under the skin. It is usually given by a health care professional in a hospital or clinic setting. If you get this medicine at home, you will be taught how to prepare and give this medicine. Use exactly as directed. Take your medicine at regular intervals. Do not take your medicine more often than directed. It is important that you put your used needles and syringes in a special sharps container. Do not put them in a trash can. If you do not have a sharps container, call your pharmacist or healthcare provider to get one. A special MedGuide will be given to you by the pharmacist with each prescription and refill. Be sure to read this information carefully each time. Talk to your pediatrician regarding the use of this medicine in children. While this medicine may be used in children as young as 1 month of age for selected conditions, precautions do  apply. Overdosage: If you think you have taken too much of this medicine contact a poison control center or emergency room at once. NOTE: This medicine is only for you. Do not share this medicine with others. What if I miss a dose? If you miss a dose, take it as soon as you can. If it is almost time for your next dose, take only that dose. Do not take double or extra doses. What may interact with this medicine? Do not take this medicine with any of the following medications:  epoetin alfa This list may not describe all possible interactions. Give your health care provider a list of all the medicines, herbs, non-prescription drugs, or dietary supplements you use. Also tell them if you smoke, drink alcohol, or use illegal drugs. Some items may interact with your medicine. What should I watch for while using this medicine? Your condition will be monitored carefully while you are receiving this medicine. You may need blood work done while you are taking this medicine. This medicine may cause a decrease in vitamin B6. You should make sure that you get enough vitamin B6 while you are taking this medicine. Discuss the foods you eat and the vitamins you take with your health care professional. What side effects may I notice from receiving this medicine? Side effects that you should report to your doctor or health care professional as soon as possible:  allergic reactions like skin rash, itching or hives, swelling of the face, lips, or tongue  breathing problems  changes in   vision  chest pain  confusion, trouble speaking or understanding  feeling faint or lightheaded, falls  high blood pressure  muscle aches or pains  pain, swelling, warmth in the leg  rapid weight gain  severe headaches  sudden numbness or weakness of the face, arm or leg  trouble walking, dizziness, loss of balance or coordination  seizures (convulsions)  swelling of the ankles, feet, hands  unusually weak or  tired Side effects that usually do not require medical attention (report to your doctor or health care professional if they continue or are bothersome):  diarrhea  fever, chills (flu-like symptoms)  headaches  nausea, vomiting  redness, stinging, or swelling at site where injected This list may not describe all possible side effects. Call your doctor for medical advice about side effects. You may report side effects to FDA at 1-800-FDA-1088. Where should I keep my medicine? Keep out of the reach of children. Store in a refrigerator between 2 and 8 degrees C (36 and 46 degrees F). Do not freeze. Do not shake. Throw away any unused portion if using a single-dose vial. Throw away any unused medicine after the expiration date. NOTE: This sheet is a summary. It may not cover all possible information. If you have questions about this medicine, talk to your doctor, pharmacist, or health care provider.  2021 Elsevier/Gold Standard (2017-10-29 16:44:20)  

## 2020-12-07 ENCOUNTER — Ambulatory Visit: Payer: Medicare Other

## 2020-12-07 ENCOUNTER — Other Ambulatory Visit: Payer: Medicare Other

## 2020-12-07 LAB — IRON AND TIBC
Iron: 43 ug/dL (ref 41–142)
Saturation Ratios: 11 % — ABNORMAL LOW (ref 21–57)
TIBC: 395 ug/dL (ref 236–444)
UIBC: 352 ug/dL (ref 120–384)

## 2020-12-07 LAB — FERRITIN: Ferritin: 109 ng/mL (ref 11–307)

## 2020-12-08 DIAGNOSIS — Z4800 Encounter for change or removal of nonsurgical wound dressing: Secondary | ICD-10-CM | POA: Diagnosis not present

## 2020-12-08 DIAGNOSIS — I872 Venous insufficiency (chronic) (peripheral): Secondary | ICD-10-CM | POA: Diagnosis not present

## 2020-12-08 DIAGNOSIS — R238 Other skin changes: Secondary | ICD-10-CM | POA: Diagnosis not present

## 2020-12-08 DIAGNOSIS — I4811 Longstanding persistent atrial fibrillation: Secondary | ICD-10-CM | POA: Diagnosis not present

## 2020-12-08 DIAGNOSIS — L97819 Non-pressure chronic ulcer of other part of right lower leg with unspecified severity: Secondary | ICD-10-CM | POA: Diagnosis not present

## 2020-12-08 DIAGNOSIS — R600 Localized edema: Secondary | ICD-10-CM | POA: Diagnosis not present

## 2020-12-09 ENCOUNTER — Other Ambulatory Visit: Payer: Self-pay | Admitting: Oncology

## 2020-12-11 DIAGNOSIS — L97819 Non-pressure chronic ulcer of other part of right lower leg with unspecified severity: Secondary | ICD-10-CM | POA: Diagnosis not present

## 2020-12-11 DIAGNOSIS — R238 Other skin changes: Secondary | ICD-10-CM | POA: Diagnosis not present

## 2020-12-11 DIAGNOSIS — Z4800 Encounter for change or removal of nonsurgical wound dressing: Secondary | ICD-10-CM | POA: Diagnosis not present

## 2020-12-11 DIAGNOSIS — I872 Venous insufficiency (chronic) (peripheral): Secondary | ICD-10-CM | POA: Diagnosis not present

## 2020-12-11 DIAGNOSIS — R600 Localized edema: Secondary | ICD-10-CM | POA: Diagnosis not present

## 2020-12-11 DIAGNOSIS — I4811 Longstanding persistent atrial fibrillation: Secondary | ICD-10-CM | POA: Diagnosis not present

## 2020-12-12 ENCOUNTER — Other Ambulatory Visit: Payer: Self-pay | Admitting: Family Medicine

## 2020-12-15 DIAGNOSIS — I872 Venous insufficiency (chronic) (peripheral): Secondary | ICD-10-CM | POA: Diagnosis not present

## 2020-12-15 DIAGNOSIS — R238 Other skin changes: Secondary | ICD-10-CM | POA: Diagnosis not present

## 2020-12-15 DIAGNOSIS — R600 Localized edema: Secondary | ICD-10-CM | POA: Diagnosis not present

## 2020-12-15 DIAGNOSIS — L97819 Non-pressure chronic ulcer of other part of right lower leg with unspecified severity: Secondary | ICD-10-CM | POA: Diagnosis not present

## 2020-12-15 DIAGNOSIS — I4811 Longstanding persistent atrial fibrillation: Secondary | ICD-10-CM | POA: Diagnosis not present

## 2020-12-15 DIAGNOSIS — Z4800 Encounter for change or removal of nonsurgical wound dressing: Secondary | ICD-10-CM | POA: Diagnosis not present

## 2020-12-19 DIAGNOSIS — Z4800 Encounter for change or removal of nonsurgical wound dressing: Secondary | ICD-10-CM | POA: Diagnosis not present

## 2020-12-19 DIAGNOSIS — L97819 Non-pressure chronic ulcer of other part of right lower leg with unspecified severity: Secondary | ICD-10-CM | POA: Diagnosis not present

## 2020-12-19 DIAGNOSIS — I872 Venous insufficiency (chronic) (peripheral): Secondary | ICD-10-CM | POA: Diagnosis not present

## 2020-12-19 DIAGNOSIS — R600 Localized edema: Secondary | ICD-10-CM | POA: Diagnosis not present

## 2020-12-19 DIAGNOSIS — I4811 Longstanding persistent atrial fibrillation: Secondary | ICD-10-CM | POA: Diagnosis not present

## 2020-12-19 DIAGNOSIS — R238 Other skin changes: Secondary | ICD-10-CM | POA: Diagnosis not present

## 2020-12-20 ENCOUNTER — Other Ambulatory Visit: Payer: Self-pay

## 2020-12-20 ENCOUNTER — Ambulatory Visit (INDEPENDENT_AMBULATORY_CARE_PROVIDER_SITE_OTHER): Payer: Medicare Other | Admitting: Family Medicine

## 2020-12-20 ENCOUNTER — Encounter: Payer: Self-pay | Admitting: Family Medicine

## 2020-12-20 ENCOUNTER — Telehealth: Payer: Self-pay | Admitting: Family Medicine

## 2020-12-20 VITALS — BP 100/60 | HR 70 | Ht <= 58 in

## 2020-12-20 DIAGNOSIS — R4182 Altered mental status, unspecified: Secondary | ICD-10-CM

## 2020-12-20 DIAGNOSIS — I959 Hypotension, unspecified: Secondary | ICD-10-CM

## 2020-12-20 LAB — POCT URINALYSIS DIPSTICK
Bilirubin, UA: NEGATIVE
Blood, UA: NEGATIVE
Glucose, UA: NEGATIVE
Ketones, UA: NEGATIVE
Nitrite, UA: NEGATIVE
Protein, UA: POSITIVE — AB
Spec Grav, UA: 1.015 (ref 1.010–1.025)
Urobilinogen, UA: 0.2 E.U./dL
pH, UA: 5 (ref 5.0–8.0)

## 2020-12-20 NOTE — Patient Instructions (Signed)
Hold gabapentin for now  Hold losartan for now  SET UP FOLLOW UP WITH DR Ethlyn Gallery TO FURTHER ASSESS   Confusion Confusion is the inability to think with your usual speed or clarity. Confusion can be caused by many things. People who are confused often describe their thinking as cloudy or unclear. Confusion can also include feeling disoriented. This means you are unaware of where you are or who you are. You may also not know the date or time. When confused, you may have trouble remembering, paying attention, or making decisions. Some people also act aggressively when they are confused. In some cases, confusion may come on quickly. In other cases, it may develop slowly over time. Confusion may be caused by medical conditions such as:  Infections, such as a urinary tract infection (UTI).  Low levels of oxygen, which can develop from conditions such as long-term lung disorders.  Decrease in brain function due to dementia and other conditions that affect the brain, such as seizures, strokes, brain tumors, or head injuries.  Mental health conditions, like panic attacks, anxiety, depression, and hallucinations. Confusion may also be caused by physical factors such as:  Loss of fluid (dehydration) or an imbalance of salts and minerals in the body (electrolytes).  Lack of certain nutrients like niacin, thiamine, or other B vitamins.  Fever or hypothermia, which is a sudden drop in body temperature.  Low or high blood sugar.  Low or high blood pressure. Other causes include:  Lack of sleep or changes in routine or surroundings, such as when traveling or staying in a hospital.  Using too much alcohol, drugs, or medicine.  Side effects of medicines, or taking medicines that affect other medicines (drug interactions). Follow these instructions at home: Pay attention to your symptoms. Tell your health care provider about any changes or if you develop new symptoms. Follow these instructions  to control or treat symptoms. Ask a family member or friend for help if needed. Medicines  Take over-the-counter and prescription medicines only as told by your health care provider.  Ask your health care provider about changing or stopping any medicines that may be causing your confusion.  Avoid pain medicines or sleep medicines until you have fully recovered.  Use a pillbox or an alarm to help you take the right medicines at the right time.   Lifestyle  Eat a balanced diet that includes fruits and vegetables.  Get enough sleep. For most adults, this is 7-9 hours each night.  Do not drink alcohol.  Do not become isolated. Spend time with other people and make plans for your days.  Do not drive until your health care provider says that it is safe to do so.  Do not use any products that contain nicotine or tobacco, such as cigarettes, e-cigarettes, and chewing tobacco. If you need help quitting, ask your health care provider.  Stop other activities that may increase your chances of getting hurt. These may include some work duties, sports activities, swimming, or bike riding. Ask your health care provider what activities are safe for you.   Tips for caregivers  Find out if the person is confused. Ask the person to state his or her name, age, and the date. If the person is unsure or answers incorrectly, he or she may be confused and need assistance.  Always introduce yourself, no matter how well the person knows you. Remind the person of his or her location.  Place a calendar and clock near the person who  is confused. Keep a regular schedule. Make sure the person has plenty of light during the day and sleep at night.  Talk about current events and plans for the day.  Keep the environment calm, quiet, and peaceful.  Help the person do the things that he or she is unable to do. These include: ? Taking medicines. ? Keeping medical appointments. ? Helping with household duties,  including meal preparation. ? Running errands.  Get help if you need it. There are several support groups for caregivers. If the person you are helping needs more support, consider day care, extended-care programs, or a skilled nursing facility. The person's health care provider may be able to help evaluate these options. General instructions  Monitor yourself for any conditions you may have. These can include: ? Checking your blood glucose levels if you have diabetes. ? Maintaining a healthy weight. ? Monitoring your blood pressure if you have hypertension. ? Monitoring your body temperature if you have a fever.  Keep all follow-up visits. This is important. Contact a health care provider if:  You have new symptoms or your symptoms get worse. Get help right away if you:  Feel that you are not able to care for yourself.  Develop severe headaches, repeated vomiting, seizures, blackouts, or slurred speech.  Have increasing confusion, weakness, numbness, restlessness, or personality changes.  Develop a loss of balance, have marked dizziness, feel uncoordinated, or fall.  Develop severe anxiety, or you have delusions or hallucinations. These symptoms may represent a serious problem that is an emergency. Do not wait to see if the symptoms will go away. Get medical help right away. Call your local emergency services (911 in the [place] Do not drive yourself to the hospital. Summary  Confusion is the inability to think with your usual speed or clarity. People who are confused often describe their thinking as cloudy or unclear.  Confusion can also include having trouble remembering, paying attention, or making decisions.  Confusion may come on quickly or develop slowly over time, depending on the cause. There are many different causes of confusion.  Ask for help from family members or friends if you are unable to take care of yourself. This information is not intended to replace advice  given to you by your health care provider. Make sure you discuss any questions you have with your health care provider. Document Revised: 02/08/2020 Document Reviewed: 02/08/2020 Elsevier Patient Education  2021 [employer].

## 2020-12-20 NOTE — Progress Notes (Signed)
Established Patient Office Visit  Subjective:  Patient ID: Charlene Wade, female    DOB: 10/26/1928  Age: 87 y.o. MRN: [id number]  CC:  Chief Complaint  Patient presents with   Gait Problem   Medication Problem    HPI  Charlene Wade presents for transient changes in mental status.  Her caregiver states that over the past few weeks she has had some gradual changes in mental status.  They are fairly certain this is related to her gabapentin which was started because of some right lower extremity pain.  She is currently taking low-dose of 100 mg twice daily.  She takes infrequent tramadol.  No focal weakness.  No known head injuries.  No fevers or chills.  No cough.  No dysuria.  They have not noted any focal weakness.  Caregiver notes that she has had impairment cognitively.  For example, she has had some short-term memory deficits which are new.  No recent chest pains.  No abdominal pains.  Drinking fluids okay.  No hallucinations or delusions.  Charlene Wade has lost some weight and her blood pressures have recently been dropping down.  She is currently on Cardizem, furosemide, carvedilol, and losartan.  She does not ambulate very much and ambulates with a walker and is very unsteady with ambulation.  Her chronic problems include history of hypertension, cerebrovascular disease, atrial fibrillation,, GERD, osteoporosis, chronic kidney disease, overactive bladder  Past Medical History:  Diagnosis Date   Allergic rhinitis    Atrial fibrillation ([place]) 09/26/2017   New-onset A. Fib now persistent   BCC (basal cell carcinoma of skin) 04/10/2015   sees dermatologist   CKD (chronic kidney disease) stage 3, GFR 30-59 ml/min ([place]) 04/26/2015   Depression 10/17/2012   Ectopic pregnancy    GERD (gastroesophageal reflux disease)    hx hiatal hernia, has seen GI, ENT and allergist in the past   Hyperlipidemia    Hypertension    OA (osteoarthritis)    knees and back, hx DDD, s/p  remote lumbar disc surgery   OAB (overactive bladder) 04/10/2015   Obesity    Osteoporosis    on prolia in the past   Stroke [facility])    sees neurologist, hx memory loss, expressive aphasia   Venous insufficiency     Past Surgical History:  Procedure Laterality Date   ABDOMINAL HYSTERECTOMY     APPENDECTOMY     BREAST REDUCTION SURGERY     LAPAROSCOPIC SALPINGOOPHERECTOMY     NASAL MASS EXCISION     Basal Cell Cancer Excision   NASAL RECONSTRUCTION     TEE WITHOUT CARDIOVERSION  10/22/2012   Procedure: TRANSESOPHAGEAL ECHOCARDIOGRAM (TEE);  Surgeon: Josue Hector, MD;  Location: [facility] ENDOSCOPY;  Service: Cardiovascular;  Laterality: N/A;    Family History  Problem Relation Age of Onset   Hypertension Father    Hypertension Mother    Cancer - Lung Brother     Social History   Socioeconomic History   Marital status: Widowed    Spouse name: Not on file   Number of children: 2   Years of education: 2   Highest education level: Not on file  Occupational History   Occupation: retired  Tobacco Use   Smoking status: Former Smoker   Smokeless tobacco: Never Used  Scientific laboratory technician Use: Never used  Substance and Sexual Activity   Alcohol use: No   Drug use: No   Sexual activity: Never  Other Topics Concern  Not on file  Social History Narrative   Patient is single with 2 children.   Patient is right handed.   Patient has 12 th grade education.   Patient does not drink caffeine.   Social Determinants of Health   Financial Resource Strain: Not on file  Food Insecurity: Not on file  Transportation Needs: Not on file  Physical Activity: Not on file  Stress: Not on file  Social Connections: Not on file  Intimate Partner Violence: Not on file    Outpatient Medications Prior to Visit  Medication Sig Dispense Refill   acetaminophen (TYLENOL) 500 MG tablet Take 1 tablet (500 mg total) by mouth every 6 (six) hours as needed. 30 tablet 0    albuterol (VENTOLIN HFA) 108 (90 Base) MCG/ACT inhaler Inhale 2 puffs into the lungs every 6 (six) hours as needed. 1 each 1   allopurinol (ZYLOPRIM) 100 MG tablet TAKE 1 TABLET(100 MG) BY MOUTH DAILY 90 tablet 1   calcitonin, salmon, (MIACALCIN/FORTICAL) 200 UNIT/ACT nasal spray   0   carvedilol (COREG) 6.25 MG tablet Take 1 tablet (6.25 mg total) by mouth 2 (two) times daily with a meal. 180 tablet 3   cephALEXin (KEFLEX) 500 MG capsule Take 1 capsule (500 mg total) by mouth 3 (three) times daily. 15 capsule 0   cetirizine (ZYRTEC) 10 MG tablet Take 10 mg by mouth at bedtime.      cholecalciferol (VITAMIN D) 1000 units tablet Take 2,000 Units by mouth 2 (two) times daily.     Cyanocobalamin (VITAMIN B12 PO) Take 1 tablet by mouth daily.      denosumab (PROLIA) 60 MG/ML SOLN injection Inject 60 mg into the skin every 6 (six) months. Administer in upper arm, thigh, or abdomen (last injection June or July 2018)     diclofenac sodium (VOLTAREN) 1 % GEL Apply 1 application topically 4 (four) times daily as needed (arm pain/bursitis).     diltiazem (CARDIZEM CD) 240 MG 24 hr capsule TAKE 1 CAPSULE(240 MG) BY MOUTH DAILY 30 capsule 3   DULoxetine (CYMBALTA) 20 MG capsule TAKE 1 CAPSULE(20 MG) BY MOUTH DAILY 90 capsule 1   ELIQUIS 5 MG TABS tablet TAKE 1 TABLET(5 MG) BY MOUTH TWICE DAILY 180 tablet 3   fenofibrate (TRICOR) 145 MG tablet TAKE 1 TABLET(145 MG) BY MOUTH DAILY 90 tablet 1   ferrous sulfate 325 (65 FE) MG EC tablet TAKE 1 TABLET(325 MG) BY MOUTH TWICE DAILY BEFORE A MEAL 60 tablet 3   fluticasone (FLONASE) 50 MCG/ACT nasal spray SHAKE LIQUID AND USE 1 SPRAY IN EACH NOSTRIL TWICE DAILY 48 g 4   fluticasone (FLOVENT HFA) 44 MCG/ACT inhaler Inhale 2 puffs into the lungs 2 (two) times daily for 14 days. 1 Inhaler 0   furosemide (LASIX) 20 MG tablet TAKE 1 TABLET(20 MG) BY MOUTH DAILY AS NEEDED FOR SWELLING IN LEGS 30 tablet 0   Lidocaine 4 % PTCH Apply 1 patch topically 2 (two)  times daily. 12 patch 0   Multiple Vitamins-Minerals (PRESERVISION AREDS PO) Take 1 tablet by mouth 3 (three) times daily.      oxybutynin (DITROPAN-XL) 5 MG 24 hr tablet TAKE 1 TABLET BY MOUTH EVERY DAY 90 tablet 1   pantoprazole (PROTONIX) 20 MG tablet TAKE 1 TABLET(20 MG) BY MOUTH DAILY 90 tablet 0   Polyethyl Glycol-Propyl Glycol (SYSTANE OP) Place 1 drop into both eyes 2 (two) times daily as needed (dry eyes).     Spacer/Aero-Holding Chambers (AEROCHAMBER PLUS) inhaler Use  as instructed 1 each 0   traMADol (ULTRAM) 50 MG tablet Take 1 tablet (50 mg total) by mouth every 6 (six) hours as needed. 30 tablet 0   acetaminophen (TYLENOL) 500 MG tablet Take 1 tablet (500 mg total) by mouth 2 (two) times daily as needed for headache (pain). 20 tablet 0   benzonatate (TESSALON) 100 MG capsule TAKE 1 CAPSULE(100 MG) BY MOUTH THREE TIMES DAILY AS NEEDED 30 capsule 1   gabapentin (NEURONTIN) 100 MG capsule Take 1 capsule (100 mg total) by mouth 2 (two) times daily. 60 capsule 1   losartan (COZAAR) 50 MG tablet TAKE 1 TABLET(50 MG) BY MOUTH DAILY 90 tablet 0   No facility-administered medications prior to visit.    Allergies  Allergen Reactions   Levaquin [Levofloxacin] Other (See Comments)    Severe intermittent arm pain   Ace Inhibitors Cough   Codeine Other (See Comments)    Unknown reaction - didn't feel well?   Penicillins Other (See Comments)    Caused high fever Has patient had a PCN reaction causing immediate rash, facial/tongue/throat swelling, SOB or lightheadedness with hypotension: No Has patient had a PCN reaction causing severe rash involving mucus membranes or skin necrosis: No Has patient had a PCN reaction that required hospitalization: No Has patient had a PCN reaction occurring within the last 10 years: No If all of the above answers are "NO", then may proceed with Cephalosporin use.   Sulfa Antibiotics Other (See Comments)    Possibly caused fever    Sulfasalazine Other (See Comments)    Possibly caused fever    ROS Review of Systems  Constitutional: Positive for fatigue. Negative for chills and fever.  Respiratory: Negative for cough and shortness of breath.   Cardiovascular: Negative for chest pain.  Gastrointestinal: Negative for abdominal pain.  Genitourinary: Negative for dysuria.  Neurological: Negative for dizziness, seizures, syncope, speech difficulty and headaches.  Hematological: Negative for adenopathy.  Psychiatric/Behavioral: Positive for confusion. Negative for agitation and hallucinations.      Objective:    Physical Exam Vitals reviewed.  Constitutional:      Appearance: Normal appearance.  Cardiovascular:     Rate and Rhythm: Normal rate and regular rhythm.  Pulmonary:     Effort: Pulmonary effort is normal.     Breath sounds: Normal breath sounds.  Neurological:     General: No focal deficit present.     Mental Status: She is alert.     Comments: She is oriented to year but not day of week or date     BP 100/60    Pulse 70    Ht 4\' 9"  (1.448 m)    BMI 22.94 kg/m  Wt Readings from Last 3 Encounters:  11/28/20 106 lb (48.1 kg)  09/20/20 118 lb 12.8 oz (53.9 kg)  09/15/20 116 lb 10 oz (52.9 kg)     There are no preventive care reminders to display for this patient.  There are no preventive care reminders to display for this patient.  Lab Results  Component Value Date   TSH 1.38 03/01/2020   Lab Results  Component Value Date   WBC 4.0 12/06/2020   HGB 10.1 (L) 12/06/2020   HCT 35.0 (L) 12/06/2020   MCV 96.7 12/06/2020   PLT 228 12/06/2020   Lab Results  Component Value Date   NA 139 08/02/2020   K 5.0 08/02/2020   CO2 22 08/02/2020   GLUCOSE 138 (H) 08/02/2020   BUN 27 (H) 08/02/2020  CREATININE 1.87 (H) 08/02/2020   BILITOT 0.5 08/02/2020   ALKPHOS 42 03/01/2020   AST 26 08/02/2020   ALT 7 08/02/2020   PROT 5.7 (L) 08/02/2020   ALBUMIN 3.5 03/01/2020   CALCIUM 8.7  08/02/2020   ANIONGAP 9 02/26/2019   GFR 28.03 (L) 03/28/2020   Lab Results  Component Value Date   CHOL 104 03/01/2020   Lab Results  Component Value Date   HDL 37.80 (L) 03/01/2020   Lab Results  Component Value Date   LDLCALC 46 03/01/2020   Lab Results  Component Value Date   TRIG 102.0 03/01/2020   Lab Results  Component Value Date   CHOLHDL 3 03/01/2020   Lab Results  Component Value Date   HGBA1C 6.0 06/16/2018      Assessment & Plan:   #1 patient presents with reported 3-week history of gradual change in mental status.  Possibly correlates with gabapentin onset.  She is on low-dose gabapentin.  No focal neurologic symptoms.  No obvious infectious symptoms such as fever.  Past history of cerebrovascular disease but no recent specific symptoms to suggest CVA  -Check UA, CBC, basic metabolic panel -Hold gabapentin for now  #2 hypertension history.  Her blood pressure was very low today.  We had difficulty auscultating and palpated her systolic blood pressure around 88.  She has had some weight loss which is likely contributing to low blood pressures.  -Focus on good hydration -Hold losartan for now -SET UP FOLLOW UP WITH PRIMARY PROVIDER TO REASSESS.  No orders of the defined types were placed in this encounter.   Follow-up: No follow-ups on file.    Carolann Littler, MD

## 2020-12-20 NOTE — Telephone Encounter (Signed)
Spoke with the pts caregiver Garnett Farm.  Garnett Farm stated she is not sure when the symptoms started as this has been "gradual" for the past few days and could not relay to me more specifics.  Appt scheduled to arrive today at 3pm to see Dr Elease Hashimoto as PCP does not have any openings.

## 2020-12-20 NOTE — Telephone Encounter (Signed)
Caregiver is calling in stating that the pt is very confused, shaking in arms and unsteadiness and thinks that it is coming from the Rx gabapentin (NEURONTIN) 100 MG and would like to have a different medication.  Caregiver would like to have a call back.

## 2020-12-20 NOTE — Telephone Encounter (Signed)
Please call back, get details. Could be from neurontin, but there are many things that can cause confusion including infection; please get timeline for symptoms/medications/specifics. Would suggest she have visit/evaluation.

## 2020-12-21 LAB — URINE CULTURE
MICRO NUMBER:: 11570051
SPECIMEN QUALITY:: ADEQUATE

## 2020-12-22 ENCOUNTER — Emergency Department (HOSPITAL_COMMUNITY): Payer: Medicare Other

## 2020-12-22 ENCOUNTER — Inpatient Hospital Stay (HOSPITAL_COMMUNITY): Payer: Medicare Other

## 2020-12-22 ENCOUNTER — Inpatient Hospital Stay (HOSPITAL_COMMUNITY)
Admission: EM | Admit: 2020-12-22 | Discharge: 2020-12-26 | DRG: 682 | Disposition: E | Payer: Medicare Other | Attending: Internal Medicine | Admitting: Internal Medicine

## 2020-12-22 ENCOUNTER — Other Ambulatory Visit: Payer: Self-pay

## 2020-12-22 DIAGNOSIS — J9601 Acute respiratory failure with hypoxia: Secondary | ICD-10-CM | POA: Diagnosis present

## 2020-12-22 DIAGNOSIS — Z8249 Family history of ischemic heart disease and other diseases of the circulatory system: Secondary | ICD-10-CM

## 2020-12-22 DIAGNOSIS — M25562 Pain in left knee: Secondary | ICD-10-CM | POA: Diagnosis not present

## 2020-12-22 DIAGNOSIS — D509 Iron deficiency anemia, unspecified: Secondary | ICD-10-CM | POA: Diagnosis present

## 2020-12-22 DIAGNOSIS — N179 Acute kidney failure, unspecified: Secondary | ICD-10-CM | POA: Diagnosis present

## 2020-12-22 DIAGNOSIS — E876 Hypokalemia: Secondary | ICD-10-CM | POA: Diagnosis present

## 2020-12-22 DIAGNOSIS — N170 Acute kidney failure with tubular necrosis: Secondary | ICD-10-CM | POA: Diagnosis present

## 2020-12-22 DIAGNOSIS — I4819 Other persistent atrial fibrillation: Secondary | ICD-10-CM | POA: Diagnosis present

## 2020-12-22 DIAGNOSIS — R0902 Hypoxemia: Secondary | ICD-10-CM

## 2020-12-22 DIAGNOSIS — N184 Chronic kidney disease, stage 4 (severe): Secondary | ICD-10-CM | POA: Diagnosis present

## 2020-12-22 DIAGNOSIS — Z66 Do not resuscitate: Secondary | ICD-10-CM | POA: Diagnosis present

## 2020-12-22 DIAGNOSIS — K625 Hemorrhage of anus and rectum: Secondary | ICD-10-CM | POA: Diagnosis present

## 2020-12-22 DIAGNOSIS — E8770 Fluid overload, unspecified: Secondary | ICD-10-CM

## 2020-12-22 DIAGNOSIS — D631 Anemia in chronic kidney disease: Secondary | ICD-10-CM | POA: Diagnosis present

## 2020-12-22 DIAGNOSIS — Z85828 Personal history of other malignant neoplasm of skin: Secondary | ICD-10-CM | POA: Diagnosis not present

## 2020-12-22 DIAGNOSIS — Z87891 Personal history of nicotine dependence: Secondary | ICD-10-CM | POA: Diagnosis not present

## 2020-12-22 DIAGNOSIS — I509 Heart failure, unspecified: Secondary | ICD-10-CM | POA: Diagnosis present

## 2020-12-22 DIAGNOSIS — Z515 Encounter for palliative care: Secondary | ICD-10-CM

## 2020-12-22 DIAGNOSIS — W19XXXA Unspecified fall, initial encounter: Secondary | ICD-10-CM

## 2020-12-22 DIAGNOSIS — I080 Rheumatic disorders of both mitral and aortic valves: Secondary | ICD-10-CM | POA: Diagnosis present

## 2020-12-22 DIAGNOSIS — R296 Repeated falls: Secondary | ICD-10-CM | POA: Diagnosis present

## 2020-12-22 DIAGNOSIS — I10 Essential (primary) hypertension: Secondary | ICD-10-CM | POA: Diagnosis not present

## 2020-12-22 DIAGNOSIS — R000 Tachycardia, unspecified: Secondary | ICD-10-CM | POA: Diagnosis not present

## 2020-12-22 DIAGNOSIS — Z888 Allergy status to other drugs, medicaments and biological substances status: Secondary | ICD-10-CM

## 2020-12-22 DIAGNOSIS — S3993XA Unspecified injury of pelvis, initial encounter: Secondary | ICD-10-CM | POA: Diagnosis not present

## 2020-12-22 DIAGNOSIS — S199XXA Unspecified injury of neck, initial encounter: Secondary | ICD-10-CM | POA: Diagnosis not present

## 2020-12-22 DIAGNOSIS — M810 Age-related osteoporosis without current pathological fracture: Secondary | ICD-10-CM | POA: Diagnosis present

## 2020-12-22 DIAGNOSIS — M109 Gout, unspecified: Secondary | ICD-10-CM | POA: Diagnosis present

## 2020-12-22 DIAGNOSIS — Z79899 Other long term (current) drug therapy: Secondary | ICD-10-CM

## 2020-12-22 DIAGNOSIS — G9341 Metabolic encephalopathy: Secondary | ICD-10-CM | POA: Diagnosis present

## 2020-12-22 DIAGNOSIS — I130 Hypertensive heart and chronic kidney disease with heart failure and stage 1 through stage 4 chronic kidney disease, or unspecified chronic kidney disease: Secondary | ICD-10-CM | POA: Diagnosis present

## 2020-12-22 DIAGNOSIS — Z7189 Other specified counseling: Secondary | ICD-10-CM

## 2020-12-22 DIAGNOSIS — L03115 Cellulitis of right lower limb: Secondary | ICD-10-CM | POA: Diagnosis present

## 2020-12-22 DIAGNOSIS — R609 Edema, unspecified: Secondary | ICD-10-CM | POA: Diagnosis not present

## 2020-12-22 DIAGNOSIS — F32A Depression, unspecified: Secondary | ICD-10-CM | POA: Diagnosis present

## 2020-12-22 DIAGNOSIS — Z7901 Long term (current) use of anticoagulants: Secondary | ICD-10-CM

## 2020-12-22 DIAGNOSIS — I2510 Atherosclerotic heart disease of native coronary artery without angina pectoris: Secondary | ICD-10-CM | POA: Diagnosis present

## 2020-12-22 DIAGNOSIS — Z6827 Body mass index (BMI) 27.0-27.9, adult: Secondary | ICD-10-CM

## 2020-12-22 DIAGNOSIS — N281 Cyst of kidney, acquired: Secondary | ICD-10-CM | POA: Diagnosis not present

## 2020-12-22 DIAGNOSIS — I959 Hypotension, unspecified: Secondary | ICD-10-CM | POA: Diagnosis present

## 2020-12-22 DIAGNOSIS — E785 Hyperlipidemia, unspecified: Secondary | ICD-10-CM | POA: Diagnosis present

## 2020-12-22 DIAGNOSIS — J90 Pleural effusion, not elsewhere classified: Secondary | ICD-10-CM | POA: Diagnosis not present

## 2020-12-22 DIAGNOSIS — Z882 Allergy status to sulfonamides status: Secondary | ICD-10-CM

## 2020-12-22 DIAGNOSIS — J449 Chronic obstructive pulmonary disease, unspecified: Secondary | ICD-10-CM | POA: Diagnosis present

## 2020-12-22 DIAGNOSIS — Z20822 Contact with and (suspected) exposure to covid-19: Secondary | ICD-10-CM | POA: Diagnosis present

## 2020-12-22 DIAGNOSIS — Z881 Allergy status to other antibiotic agents status: Secondary | ICD-10-CM

## 2020-12-22 DIAGNOSIS — I5041 Acute combined systolic (congestive) and diastolic (congestive) heart failure: Secondary | ICD-10-CM | POA: Diagnosis not present

## 2020-12-22 DIAGNOSIS — E875 Hyperkalemia: Secondary | ICD-10-CM

## 2020-12-22 DIAGNOSIS — D649 Anemia, unspecified: Secondary | ICD-10-CM | POA: Diagnosis not present

## 2020-12-22 DIAGNOSIS — L97919 Non-pressure chronic ulcer of unspecified part of right lower leg with unspecified severity: Secondary | ICD-10-CM | POA: Diagnosis present

## 2020-12-22 DIAGNOSIS — E872 Acidosis: Secondary | ICD-10-CM | POA: Diagnosis present

## 2020-12-22 DIAGNOSIS — R4182 Altered mental status, unspecified: Secondary | ICD-10-CM | POA: Diagnosis not present

## 2020-12-22 DIAGNOSIS — I69920 Aphasia following unspecified cerebrovascular disease: Secondary | ICD-10-CM

## 2020-12-22 DIAGNOSIS — K219 Gastro-esophageal reflux disease without esophagitis: Secondary | ICD-10-CM | POA: Diagnosis present

## 2020-12-22 DIAGNOSIS — I129 Hypertensive chronic kidney disease with stage 1 through stage 4 chronic kidney disease, or unspecified chronic kidney disease: Secondary | ICD-10-CM | POA: Diagnosis not present

## 2020-12-22 DIAGNOSIS — Z880 Allergy status to penicillin: Secondary | ICD-10-CM

## 2020-12-22 DIAGNOSIS — R220 Localized swelling, mass and lump, head: Secondary | ICD-10-CM | POA: Diagnosis not present

## 2020-12-22 DIAGNOSIS — E860 Dehydration: Secondary | ICD-10-CM | POA: Diagnosis present

## 2020-12-22 DIAGNOSIS — L89892 Pressure ulcer of other site, stage 2: Secondary | ICD-10-CM | POA: Diagnosis present

## 2020-12-22 DIAGNOSIS — E440 Moderate protein-calorie malnutrition: Secondary | ICD-10-CM | POA: Diagnosis present

## 2020-12-22 DIAGNOSIS — R404 Transient alteration of awareness: Secondary | ICD-10-CM | POA: Diagnosis not present

## 2020-12-22 DIAGNOSIS — S0993XA Unspecified injury of face, initial encounter: Secondary | ICD-10-CM | POA: Diagnosis not present

## 2020-12-22 DIAGNOSIS — M25552 Pain in left hip: Secondary | ICD-10-CM | POA: Diagnosis not present

## 2020-12-22 DIAGNOSIS — Z885 Allergy status to narcotic agent status: Secondary | ICD-10-CM

## 2020-12-22 LAB — URINALYSIS, ROUTINE W REFLEX MICROSCOPIC
Bilirubin Urine: NEGATIVE
Glucose, UA: NEGATIVE mg/dL
Ketones, ur: NEGATIVE mg/dL
Leukocytes,Ua: NEGATIVE
Nitrite: NEGATIVE
Protein, ur: NEGATIVE mg/dL
Specific Gravity, Urine: 1.014 (ref 1.005–1.030)
pH: 5 (ref 5.0–8.0)

## 2020-12-22 LAB — CBC WITH DIFFERENTIAL/PLATELET
Abs Immature Granulocytes: 0.06 10*3/uL (ref 0.00–0.07)
Basophils Absolute: 0 10*3/uL (ref 0.0–0.1)
Basophils Relative: 0 %
Eosinophils Absolute: 0 10*3/uL (ref 0.0–0.5)
Eosinophils Relative: 0 %
HCT: 37.2 % (ref 36.0–46.0)
Hemoglobin: 10.8 g/dL — ABNORMAL LOW (ref 12.0–15.0)
Immature Granulocytes: 1 %
Lymphocytes Relative: 5 %
Lymphs Abs: 0.5 10*3/uL — ABNORMAL LOW (ref 0.7–4.0)
MCH: 27.8 pg (ref 26.0–34.0)
MCHC: 29 g/dL — ABNORMAL LOW (ref 30.0–36.0)
MCV: 95.9 fL (ref 80.0–100.0)
Monocytes Absolute: 0.6 10*3/uL (ref 0.1–1.0)
Monocytes Relative: 6 %
Neutro Abs: 9.6 10*3/uL — ABNORMAL HIGH (ref 1.7–7.7)
Neutrophils Relative %: 88 %
Platelets: 261 10*3/uL (ref 150–400)
RBC: 3.88 MIL/uL (ref 3.87–5.11)
RDW: 19.1 % — ABNORMAL HIGH (ref 11.5–15.5)
WBC: 10.9 10*3/uL — ABNORMAL HIGH (ref 4.0–10.5)
nRBC: 1.6 % — ABNORMAL HIGH (ref 0.0–0.2)

## 2020-12-22 LAB — COMPREHENSIVE METABOLIC PANEL
ALT: 26 U/L (ref 0–44)
AST: 74 U/L — ABNORMAL HIGH (ref 15–41)
Albumin: 2.8 g/dL — ABNORMAL LOW (ref 3.5–5.0)
Alkaline Phosphatase: 41 U/L (ref 38–126)
Anion gap: 10 (ref 5–15)
BUN: 103 mg/dL — ABNORMAL HIGH (ref 8–23)
CO2: 17 mmol/L — ABNORMAL LOW (ref 22–32)
Calcium: 8.6 mg/dL — ABNORMAL LOW (ref 8.9–10.3)
Chloride: 105 mmol/L (ref 98–111)
Creatinine, Ser: 3.65 mg/dL — ABNORMAL HIGH (ref 0.44–1.00)
GFR, Estimated: 11 mL/min — ABNORMAL LOW (ref >60)
Glucose, Bld: 91 mg/dL (ref 70–99)
Potassium: 7 mmol/L (ref 3.5–5.1)
Sodium: 132 mmol/L — ABNORMAL LOW (ref 135–145)
Total Bilirubin: 1.2 mg/dL (ref 0.3–1.2)
Total Protein: 5.7 g/dL — ABNORMAL LOW (ref 6.5–8.1)

## 2020-12-22 LAB — TROPONIN I (HIGH SENSITIVITY)
Troponin I (High Sensitivity): 15 ng/L (ref <18)
Troponin I (High Sensitivity): 13 ng/L (ref <18)

## 2020-12-22 LAB — RESP PANEL BY RT-PCR (FLU A&B, COVID) ARPGX2
Influenza A by PCR: NEGATIVE
Influenza B by PCR: NEGATIVE
SARS Coronavirus 2 by RT PCR: NEGATIVE

## 2020-12-22 LAB — CK: Total CK: 365 U/L — ABNORMAL HIGH (ref 38–234)

## 2020-12-22 LAB — BASIC METABOLIC PANEL
Anion gap: 11 (ref 5–15)
BUN: 101 mg/dL — ABNORMAL HIGH (ref 8–23)
CO2: 15 mmol/L — ABNORMAL LOW (ref 22–32)
Calcium: 8.6 mg/dL — ABNORMAL LOW (ref 8.9–10.3)
Chloride: 109 mmol/L (ref 98–111)
Creatinine, Ser: 3.48 mg/dL — ABNORMAL HIGH (ref 0.44–1.00)
GFR, Estimated: 12 mL/min — ABNORMAL LOW (ref >60)
Glucose, Bld: 86 mg/dL (ref 70–99)
Potassium: 6.7 mmol/L (ref 3.5–5.1)
Sodium: 135 mmol/L (ref 135–145)

## 2020-12-22 LAB — LACTIC ACID, PLASMA
Lactic Acid, Venous: 0.6 mmol/L (ref 0.5–1.9)
Lactic Acid, Venous: 1.3 mmol/L (ref 0.5–1.9)

## 2020-12-22 LAB — GLUCOSE, CAPILLARY: Glucose-Capillary: 74 mg/dL (ref 70–99)

## 2020-12-22 LAB — POTASSIUM: Potassium: 7 mmol/L (ref 3.5–5.1)

## 2020-12-22 LAB — MAGNESIUM: Magnesium: 2 mg/dL (ref 1.7–2.4)

## 2020-12-22 MED ORDER — ALBUMIN HUMAN 25 % IV SOLN
25.0000 g | Freq: Four times a day (QID) | INTRAVENOUS | Status: DC
  Administered 2020-12-22 – 2020-12-23 (×3): 25 g via INTRAVENOUS
  Filled 2020-12-22 (×5): qty 100

## 2020-12-22 MED ORDER — FLUTICASONE PROPIONATE 50 MCG/ACT NA SUSP
1.0000 | Freq: Every day | NASAL | Status: DC
  Administered 2020-12-23: 1 via NASAL
  Filled 2020-12-22: qty 16

## 2020-12-22 MED ORDER — SODIUM CHLORIDE 0.9 % IV BOLUS
500.0000 mL | Freq: Once | INTRAVENOUS | Status: AC
  Administered 2020-12-22: 500 mL via INTRAVENOUS

## 2020-12-22 MED ORDER — LORATADINE 10 MG PO TABS
10.0000 mg | ORAL_TABLET | Freq: Every day | ORAL | Status: DC
  Administered 2020-12-22: 10 mg via ORAL
  Filled 2020-12-22: qty 1

## 2020-12-22 MED ORDER — FUROSEMIDE 10 MG/ML IJ SOLN
60.0000 mg | Freq: Once | INTRAMUSCULAR | Status: AC
  Administered 2020-12-22: 60 mg via INTRAVENOUS
  Filled 2020-12-22: qty 6

## 2020-12-22 MED ORDER — SODIUM CHLORIDE 0.45 % IV SOLN
INTRAVENOUS | Status: DC
  Filled 2020-12-22: qty 1000

## 2020-12-22 MED ORDER — SODIUM CHLORIDE 0.9 % IV BOLUS
1000.0000 mL | Freq: Once | INTRAVENOUS | Status: AC
  Administered 2020-12-22: 1000 mL via INTRAVENOUS

## 2020-12-22 MED ORDER — TRAMADOL HCL 50 MG PO TABS: 50.0000 mg | ORAL_TABLET | Freq: Two times a day (BID) | ORAL | Status: DC | PRN

## 2020-12-22 MED ORDER — ACETAMINOPHEN 500 MG PO TABS: 500.0000 mg | ORAL_TABLET | Freq: Four times a day (QID) | ORAL | Status: DC | PRN

## 2020-12-22 MED ORDER — ALBUTEROL SULFATE HFA 108 (90 BASE) MCG/ACT IN AERS
2.0000 | INHALATION_SPRAY | Freq: Four times a day (QID) | RESPIRATORY_TRACT | Status: DC | PRN
  Filled 2020-12-22: qty 6.7

## 2020-12-22 MED ORDER — SODIUM CHLORIDE 0.9 % IV SOLN
1.0000 g | Freq: Once | INTRAVENOUS | Status: AC
  Administered 2020-12-22: 1 g via INTRAVENOUS
  Filled 2020-12-22: qty 10

## 2020-12-22 MED ORDER — FLUTICASONE PROPIONATE HFA 44 MCG/ACT IN AERO: 2.0000 | INHALATION_SPRAY | Freq: Two times a day (BID) | RESPIRATORY_TRACT | Status: DC

## 2020-12-22 MED ORDER — SODIUM BICARBONATE 8.4 % IV SOLN
50.0000 meq | Freq: Once | INTRAVENOUS | Status: AC
  Administered 2020-12-22: 50 meq via INTRAVENOUS
  Filled 2020-12-22: qty 50

## 2020-12-22 MED ORDER — FENOFIBRATE 54 MG PO TABS: 54.0000 mg | ORAL_TABLET | Freq: Every day | ORAL | Status: DC

## 2020-12-22 MED ORDER — SODIUM CHLORIDE 0.9 % IV SOLN
1.0000 g | INTRAVENOUS | Status: DC
  Administered 2020-12-22 – 2020-12-23 (×2): 1 g via INTRAVENOUS
  Filled 2020-12-22 (×2): qty 10

## 2020-12-22 MED ORDER — CALCIUM GLUCONATE-NACL 1-0.675 GM/50ML-% IV SOLN
1.0000 g | Freq: Once | INTRAVENOUS | Status: AC
  Administered 2020-12-22: 1000 mg via INTRAVENOUS
  Filled 2020-12-22: qty 50

## 2020-12-22 MED ORDER — INSULIN ASPART 100 UNIT/ML IV SOLN
5.0000 [IU] | Freq: Once | INTRAVENOUS | Status: AC
  Administered 2020-12-22: 5 [IU] via INTRAVENOUS

## 2020-12-22 MED ORDER — ALLOPURINOL 100 MG PO TABS: 50.0000 mg | ORAL_TABLET | Freq: Every day | ORAL | Status: DC

## 2020-12-22 MED ORDER — DULOXETINE HCL 20 MG PO CPEP: 20.0000 mg | ORAL_CAPSULE | Freq: Every day | ORAL | Status: DC

## 2020-12-22 MED ORDER — METOPROLOL TARTRATE 5 MG/5ML IV SOLN: 2.5000 mg | Freq: Four times a day (QID) | INTRAVENOUS | Status: DC | PRN

## 2020-12-22 MED ORDER — FENTANYL CITRATE (PF) 100 MCG/2ML IJ SOLN
25.0000 ug | Freq: Once | INTRAMUSCULAR | Status: AC
  Administered 2020-12-22: 25 ug via INTRAVENOUS
  Filled 2020-12-22: qty 2

## 2020-12-22 MED ORDER — PANTOPRAZOLE SODIUM 20 MG PO TBEC
20.0000 mg | DELAYED_RELEASE_TABLET | Freq: Every day | ORAL | Status: DC
  Administered 2020-12-22 – 2020-12-23 (×2): 20 mg via ORAL
  Filled 2020-12-22 (×2): qty 1

## 2020-12-22 MED ORDER — OXYBUTYNIN CHLORIDE ER 5 MG PO TB24
5.0000 mg | ORAL_TABLET | Freq: Every day | ORAL | Status: DC
  Filled 2020-12-22: qty 1

## 2020-12-22 MED ORDER — FERROUS SULFATE 325 (65 FE) MG PO TABS: 325.0000 mg | ORAL_TABLET | Freq: Every day | ORAL | Status: DC

## 2020-12-22 MED ORDER — SODIUM ZIRCONIUM CYCLOSILICATE 10 G PO PACK
10.0000 g | PACK | Freq: Every day | ORAL | Status: DC
  Administered 2020-12-22 – 2020-12-23 (×2): 10 g via ORAL
  Filled 2020-12-22 (×2): qty 1

## 2020-12-22 MED ORDER — SODIUM BICARBONATE 8.4 % IV SOLN
INTRAVENOUS | Status: DC
  Filled 2020-12-22 (×3): qty 850

## 2020-12-22 MED ORDER — ALBUTEROL SULFATE (2.5 MG/3ML) 0.083% IN NEBU
INHALATION_SOLUTION | RESPIRATORY_TRACT | Status: AC
  Filled 2020-12-22: qty 9

## 2020-12-22 MED ORDER — APIXABAN 2.5 MG PO TABS
2.5000 mg | ORAL_TABLET | Freq: Two times a day (BID) | ORAL | Status: DC
  Administered 2020-12-22 – 2020-12-23 (×2): 2.5 mg via ORAL
  Filled 2020-12-22 (×3): qty 1

## 2020-12-22 MED ORDER — DEXTROSE 50 % IV SOLN
1.0000 | Freq: Once | INTRAVENOUS | Status: AC
  Administered 2020-12-22: 50 mL via INTRAVENOUS
  Filled 2020-12-22: qty 50

## 2020-12-22 MED ORDER — VITAMIN B-12 100 MCG PO TABS
50.0000 ug | ORAL_TABLET | Freq: Every day | ORAL | Status: DC
  Filled 2020-12-22 (×3): qty 1

## 2020-12-22 MED ORDER — ALBUTEROL SULFATE (2.5 MG/3ML) 0.083% IN NEBU
10.0000 mg | INHALATION_SOLUTION | Freq: Once | RESPIRATORY_TRACT | Status: AC
  Administered 2020-12-22: 7.5 mg via RESPIRATORY_TRACT
  Filled 2020-12-22: qty 12

## 2020-12-22 MED ORDER — SODIUM POLYSTYRENE SULFONATE 15 GM/60ML PO SUSP
30.0000 g | Freq: Once | ORAL | Status: AC
  Administered 2020-12-22: 30 g via ORAL
  Filled 2020-12-22: qty 120

## 2020-12-22 NOTE — ED Notes (Signed)
Patient transported to X-ray 

## 2020-12-22 NOTE — Progress Notes (Signed)
Pt admitted to [place]. Connected to tele monitor and 4L O2. Oriented to call bell and room as best as possible given confusion. K 6.7, hyperkalemia interventions carried out. See MAR for details. Pt resting in NAD. Family aware of tranfer.

## 2020-12-22 NOTE — ED Triage Notes (Signed)
Pt BIB EMS with c/o left knee pain. Pt was found on the floor by caregiver. Pt doesn't remember what happened. Left sided facial swelling present.Pt takes Eliquis. Pt has some baseline confusion; alert to person, place, and time on arrival. Bruising present on left shoulder, left hip, and left lower leg. Skin tears present from previous fall. Pt hypoxic with ems and on arrival to ED. 2L [place] applied.

## 2020-12-22 NOTE — ED Provider Notes (Signed)
[place] EMERGENCY DEPARTMENT Provider Note   CSN: [id number] Arrival date & time: 12/23/2020  1128     History Chief Complaint  Patient presents with  . Knee Pain  . Fall    Charlene Wade is a 87 y.o. female.  HPI   Patient with medical history of A. fib currently on Eliquis, CKD, hypertension, hyperlipidemia, presents after a fall.  Patient is a poor historian, could not elaborate very much.  She does not remember falling, but states that she is having pain on her left knee and hip.  She denies  symptoms at this time, she denies headaches, chest pain, shortness of breath, abdominal pain, worsening pedal edema.    Past Medical History:  Diagnosis Date  . Allergic rhinitis   . Atrial fibrillation ([place]) 09/26/2017   New-onset A. Fib now persistent  . BCC (basal cell carcinoma of skin) 04/10/2015   sees dermatologist  . CKD (chronic kidney disease) stage 3, GFR 30-59 ml/min ([place]) 04/26/2015  . Depression 10/17/2012  . Ectopic pregnancy   . GERD (gastroesophageal reflux disease)    hx hiatal hernia, has seen GI, ENT and allergist in the past  . Hyperlipidemia   . Hypertension   . OA (osteoarthritis)    knees and back, hx DDD, s/p remote lumbar disc surgery  . OAB (overactive bladder) 04/10/2015  . Obesity   . Osteoporosis    on prolia in the past  . Stroke [facility])    sees neurologist, hx memory loss, expressive aphasia  . Venous insufficiency     Patient Active Problem List   Diagnosis Date Noted  . Anemia 08/29/2020  . Aphasia 09/28/2017  . Atrial fibrillation ([place]) 09/26/2017  . Recurrent major depressive disorder, in partial remission ([place]) 09/02/2017  . Other chronic pain 09/02/2017  . Morbid obesity ([place]) 07/08/2017  . Neck mass 02/25/2017  . Vitamin D deficiency 06/03/2016  . CKD (chronic kidney disease) stage 3, GFR 30-59 ml/min ([facility]) 04/26/2015  . GERD (gastroesophageal reflux disease) 04/10/2015  . Osteoporosis 04/10/2015  . Leg  edema 04/10/2015  . OAB (overactive bladder) 04/10/2015  . Osteoarthritis 04/10/2015  . Allergic rhinitis 04/10/2015  . Chronic edema 03/15/2014  . Pituitary adenoma ([place]) 03/15/2014  . Edema 01/24/2014  . Backache 06/09/2013  . Cerebral artery occlusion with cerebral infarction ([place]) 06/09/2013  . Hearing loss 06/09/2013  . 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 Depressive disorder 06/09/2013  . Senile osteoporosis 06/09/2013  . Spinal stenosis 06/09/2013  . Transient cerebral ischemia 06/09/2013  . Hyperlipidemia 10/13/2012  . H/O: CVA (cerebrovascular accident) 10/12/2012  . Essential hypertension 10/12/2012    Past Surgical History:  Procedure Laterality Date  . ABDOMINAL HYSTERECTOMY    . APPENDECTOMY    . BREAST REDUCTION SURGERY    . LAPAROSCOPIC SALPINGOOPHERECTOMY    . NASAL MASS EXCISION     Basal Cell Cancer Excision  . NASAL RECONSTRUCTION    . TEE WITHOUT CARDIOVERSION  10/22/2012   Procedure: TRANSESOPHAGEAL ECHOCARDIOGRAM (TEE);  Surgeon: Josue Hector, MD;  Location: [facility] ENDOSCOPY;  Service: Cardiovascular;  Laterality: N/A;     OB History   No obstetric history on file.     Family History  Problem Relation Age of Onset  . Hypertension Father   . Hypertension Mother   . Cancer - Lung Brother     Social History   Tobacco Use  . Smoking status: Former Research scientist (life sciences)  . Smokeless tobacco: Never Used  Vaping Use  .  Vaping Use: Never used  Substance Use Topics  . Alcohol use: No  . Drug use: No    Home Medications Prior to Admission medications   Medication Sig Start Date End Date Taking? Authorizing Provider  acetaminophen (TYLENOL) 500 MG tablet Take 1 tablet (500 mg total) by mouth every 6 (six) hours as needed. 09/15/20   Varney Biles, MD  albuterol (VENTOLIN HFA) 108 (90 Base) MCG/ACT inhaler Inhale 2 puffs into the lungs every 6 (six) hours as needed. 08/02/20   Caren Macadam, MD  allopurinol (ZYLOPRIM) 100 MG tablet TAKE 1 TABLET(100  MG) BY MOUTH DAILY 12/13/20   Caren Macadam, MD  calcitonin, salmon, (MIACALCIN/FORTICAL) 200 UNIT/ACT nasal spray  06/26/18   [provider]  carvedilol (COREG) 6.25 MG tablet Take 1 tablet (6.25 mg total) by mouth 2 (two) times daily with a meal. 01/18/20   Lorretta Harp, MD  cetirizine (ZYRTEC) 10 MG tablet Take 10 mg by mouth at bedtime.     [provider]  cholecalciferol (VITAMIN D) 1000 units tablet Take 2,000 Units by mouth 2 (two) times daily.    [provider]  Cyanocobalamin (VITAMIN B12 PO) Take 1 tablet by mouth daily.     [provider]  denosumab (PROLIA) 60 MG/ML SOLN injection Inject 60 mg into the skin every 6 (six) months. Administer in upper arm, thigh, or abdomen (last injection June or July 2018)    [provider]  diclofenac sodium (VOLTAREN) 1 % GEL Apply 1 application topically 4 (four) times daily as needed (arm pain/bursitis).    [provider]  diltiazem (CARDIZEM CD) 240 MG 24 hr capsule TAKE 1 CAPSULE(240 MG) BY MOUTH DAILY 12/07/20   Koberlein, Junell C, MD  DULoxetine (CYMBALTA) 20 MG capsule TAKE 1 CAPSULE(20 MG) BY MOUTH DAILY 11/01/20   Koberlein, Junell C, MD  ELIQUIS 5 MG TABS tablet TAKE 1 TABLET(5 MG) BY MOUTH TWICE DAILY 08/29/20   Koberlein, Andris Flurry C, MD  fenofibrate (TRICOR) 145 MG tablet TAKE 1 TABLET(145 MG) BY MOUTH DAILY 07/31/20   Koberlein, Junell C, MD  ferrous sulfate 325 (65 FE) MG EC tablet TAKE 1 TABLET(325 MG) BY MOUTH TWICE DAILY BEFORE A MEAL 12/11/20   Shadad, Mathis Dad, MD  fluticasone (FLONASE) 50 MCG/ACT nasal spray SHAKE LIQUID AND USE 1 SPRAY IN EACH NOSTRIL TWICE DAILY 10/30/20   Koberlein, Steele Berg, MD  fluticasone (FLOVENT HFA) 44 MCG/ACT inhaler Inhale 2 puffs into the lungs 2 (two) times daily for 14 days. 09/07/18 09/21/18  Lucretia Kern, DO  furosemide (LASIX) 20 MG tablet TAKE 1 TABLET(20 MG) BY MOUTH DAILY AS NEEDED FOR SWELLING IN LEGS 04/24/20   Koberlein, Andris Flurry C, MD   Lidocaine 4 % PTCH Apply 1 patch topically 2 (two) times daily. 09/15/20   Varney Biles, MD  Multiple Vitamins-Minerals (PRESERVISION AREDS PO) Take 1 tablet by mouth 3 (three) times daily.     [provider]  oxybutynin (DITROPAN-XL) 5 MG 24 hr tablet TAKE 1 TABLET BY MOUTH EVERY DAY 05/26/20   Koberlein, Junell C, MD  pantoprazole (PROTONIX) 20 MG tablet TAKE 1 TABLET(20 MG) BY MOUTH DAILY 10/30/20   Koberlein, Steele Berg, MD  Polyethyl Glycol-Propyl Glycol (SYSTANE OP) Place 1 drop into both eyes 2 (two) times daily as needed (dry eyes).    [provider]  Spacer/Aero-Holding Chambers (AEROCHAMBER PLUS) inhaler Use as instructed 08/02/20   Caren Macadam, MD  traMADol (ULTRAM) 50 MG tablet Take 1  tablet (50 mg total) by mouth every 6 (six) hours as needed. 07/15/18   Burchette, Alinda Sierras, MD    Allergies    Levaquin [levofloxacin], Ace inhibitors, Codeine, Penicillins, Sulfa antibiotics, and Sulfasalazine  Review of Systems   Review of Systems  Constitutional: Negative for chills and fever.  HENT: Negative for congestion and sore throat.   Respiratory: Negative for shortness of breath.   Cardiovascular: Negative for chest pain.  Gastrointestinal: Negative for abdominal pain, diarrhea, nausea and vomiting.  Genitourinary: Negative for dysuria and enuresis.  Musculoskeletal: Negative for back pain.       Left hip and left knee pain.  Skin: Negative for rash.  Neurological: Negative for dizziness and headaches.  Hematological: Does not bruise/bleed easily.    Physical Exam Updated Vital Signs BP (!) 95/54   Pulse 65   Temp (!) 97.5 F (36.4 C) (Axillary)   Resp 16   Ht 4\' 8"  (1.422 m)   Wt 43 kg   SpO2 99%   BMI 21.25 kg/m   Physical Exam Vitals and nursing note reviewed.  Constitutional:      General: She is not in acute distress.    Appearance: She is not ill-appearing.  HENT:     Head: Normocephalic and atraumatic.     Nose: No congestion.      Mouth/Throat:     Mouth: Mucous membranes are dry.     Pharynx: Oropharynx is clear. No oropharyngeal exudate or posterior oropharyngeal erythema.  Eyes:     Conjunctiva/sclera: Conjunctivae normal.  Cardiovascular:     Rate and Rhythm: Normal rate and regular rhythm.     Pulses: Normal pulses.     Heart sounds: No murmur heard. No friction rub. No gallop.   Pulmonary:     Effort: No respiratory distress.     Breath sounds: No wheezing, rhonchi or rales.  Abdominal:     Palpations: Abdomen is soft.     Tenderness: There is no abdominal tenderness.  Musculoskeletal:        General: Tenderness present.     Right lower leg: Edema present.     Left lower leg: Edema present.     Comments: Patient has full range of motion in her upper extremities, neurovascular fully intact.  Patient has full range of motion her right lower extremity, neurovascularly intact, patient's had no tenderness along her left hip and left knee, she is unable to flex at the knee or extend at the hip, she is able to move her left toe and ankles without difficulty neurovascular fully intact.  Skin:    General: Skin is warm and dry.     Comments: Patient has noted skin tears on her left arm as well as her left leg.  Hemodynamically stable no signs infection present.  Patient's right leg has noted pressure sores, stage II, no signs of infection present.  Neurological:     Mental Status: She is alert.  Psychiatric:        Mood and Affect: Mood normal.     ED Results / Procedures / Treatments   Labs (all labs ordered are listed, but only abnormal results are displayed) Labs Reviewed  COMPREHENSIVE METABOLIC PANEL - Abnormal; Notable for the following components:      Result Value   Sodium 132 (*)    Potassium 7.0 (*)    CO2 17 (*)    BUN 103 (*)    Creatinine, Ser 3.65 (*)    Calcium 8.6 (*)  Total Protein 5.7 (*)    Albumin 2.8 (*)    AST 74 (*)    GFR, Estimated 11 (*)    All other components within  normal limits  CBC WITH DIFFERENTIAL/PLATELET - Abnormal; Notable for the following components:   WBC 10.9 (*)    Hemoglobin 10.8 (*)    MCHC 29.0 (*)    RDW 19.1 (*)    nRBC 1.6 (*)    Neutro Abs 9.6 (*)    Lymphs Abs 0.5 (*)    All other components within normal limits  URINALYSIS, ROUTINE W REFLEX MICROSCOPIC - Abnormal; Notable for the following components:   Hgb urine dipstick MODERATE (*)    Bacteria, UA RARE (*)    All other components within normal limits  CK - Abnormal; Notable for the following components:   Total CK 365 (*)    All other components within normal limits  POTASSIUM - Abnormal; Notable for the following components:   Potassium 7.0 (*)    All other components within normal limits  RESP PANEL BY RT-PCR (FLU A&B, COVID) ARPGX2  POTASSIUM  MAGNESIUM  TROPONIN I (HIGH SENSITIVITY)  TROPONIN I (HIGH SENSITIVITY)    EKG EKG Interpretation  Date/Time:  Friday December 22 2020 13:49:31 EST Ventricular Rate:  71 PR Interval:    QRS Duration: 98 QT Interval:  390 QTC Calculation: 424 R Axis:   49 Text Interpretation: Age not entered, assumed to be  87 years old for purpose of ECG interpretation Atrial fibrillation Anterior infarct, old No STEMI Confirmed by Octaviano Glow [phone]) on 12/07/2020 2:54:58 PM   Radiology DG Knee 2 Views Left  Result Date: 12/06/2020 CLINICAL DATA:  Left knee pain after fall today. EXAM: LEFT KNEE - 1-2 VIEW COMPARISON:  None. FINDINGS: No evidence of fracture, dislocation, or joint effusion. Severe narrowing of medial joint space is noted. Moderate narrowing of patellofemoral space is noted. Soft tissues are unremarkable. IMPRESSION: Severe degenerative joint disease is noted medially. No acute abnormality seen in the left knee. Electronically Signed   By: Marijo Conception M.D.   On: 11/29/2020 13:57   CT Head Wo Contrast  Result Date: 12/01/2020 CLINICAL DATA:  Facial trauma. Neck trauma. Additional history provided: Patient  found down by caregiver, left-sided facial swelling present. EXAM: CT HEAD WITHOUT CONTRAST CT MAXILLOFACIAL WITHOUT CONTRAST CT CERVICAL SPINE WITHOUT CONTRAST TECHNIQUE: Multidetector CT imaging of the head, cervical spine, and maxillofacial structures were performed using the standard protocol without intravenous contrast. Multiplanar CT image reconstructions of the cervical spine and maxillofacial structures were also generated. COMPARISON:  MRI/MRA head 09/27/2017.  Head CT 09/26/2017. FINDINGS: CT HEAD FINDINGS Brain: Mild-to-moderate cerebral atrophy. Redemonstrated densely calcified meningioma overlying the lateral left frontal lobe measuring 1.9 x 1.5 cm (series 5, image 24). Unchanged local mass effect upon the underlying left frontal lobe. No midline shift. Redemonstrated chronic right MCA territory cortical/subcortical infarct within the right frontal, parietal and temporal lobes as well as posterior right insula. Background advanced ill-defined hypoattenuation within the cerebral white matter which is nonspecific, but compatible with chronic small vessel ischemic disease. There are small infarcts within the right cerebellar hemisphere which are new as compared to the brain MRI of 09/27/2017, but otherwise age indeterminate. There is no acute intracranial hemorrhage. No demarcated cortical infarct. No extra-axial fluid collection. Vascular: Atherosclerotic calcifications. Skull: Normal. Negative for fracture or focal lesion. CT MAXILLOFACIAL FINDINGS Mildly motion degraded exam. Osseous: No acute maxillofacial fracture is identified. Orbits: No acute finding within  the orbits. The globes are normal in size and contour. The extraocular muscles and optic nerve sheath complexes are symmetric and unremarkable. Sinuses: Small volume fluid within the right frontal sinus and scattered within right ethmoid air cells. Air-fluid levels within the bilateral maxillary sinuses. Background mild scattered paranasal  sinus mucosal thickening. Soft tissues: Left cheek soft tissue swelling/hematoma. CT CERVICAL SPINE FINDINGS Mildly motion degraded exam. Alignment: Cervical dextrocurvature. Straightening of the expected cervical lordosis. 2 mm C3-C4 grade 1 anterolisthesis. Skull base and vertebrae: The basion-dental and atlanto-dental intervals are maintained.No evidence of acute fracture to the cervical spine. Soft tissues and spinal canal: No prevertebral fluid or swelling. No visible canal hematoma. Disc levels: Cervical spondylosis with multilevel disc space narrowing, disc bulges, uncovertebral hypertrophy and facet arthrosis. Disc space narrowing is severe at C4-C5, C5-C6, C6-C7 and C7-T1. No appreciable high-grade spinal canal stenosis. Multilevel bony neural foraminal narrowing. Facet joint ankylosis on the left at C4-C5. Upper chest: No consolidation within the imaged lung apices. No visible pneumothorax. Partially imaged left pleural effusion. IMPRESSION: CT head: 1. No acute posttraumatic intracranial findings. 2. Small infarcts within the right cerebellar hemisphere are new as compared to the prior brain MRI of 09/27/2017, but otherwise age-indeterminate. Consider a brain MRI for further evaluation. 3. 1.9 x 1.5 cm densely calcified meningioma overlying the lateral left frontal lobe with mild localized mass effect, unchanged. 4. Redemonstrated chronic cortical/subcortical right MCA territory infarct. 5. Stable background generalized cerebral atrophy and advanced chronic small vessel ischemic disease. CT maxillofacial: 1. Mildly motion degraded exam 2. No evidence of acute maxillofacial fracture. 3. Left cheek soft tissue swelling/hematoma. 4. Paranasal sinus disease with air-fluid levels, as described. Correlate for acute sinusitis. CT cervical spine: 1. Mildly motion degraded exam. 2. No evidence of acute fracture to the cervical spine. 3. Nonspecific straightening of the expected cervical lordosis. 4. Cervical  dextrocurvature. 5. 2 mm C3-C4 grade 1 anterolisthesis. 6. Advanced cervical spondylosis as described. This includes degenerative facet joint ankylosis on the left at C4-C5. 7. Partially imaged left pleural effusion. Electronically Signed   By: Kellie Simmering DO   On: 12/05/2020 15:17   CT Cervical Spine Wo Contrast  Result Date: 11/30/2020 CLINICAL DATA:  Facial trauma. Neck trauma. Additional history provided: Patient found down by caregiver, left-sided facial swelling present. EXAM: CT HEAD WITHOUT CONTRAST CT MAXILLOFACIAL WITHOUT CONTRAST CT CERVICAL SPINE WITHOUT CONTRAST TECHNIQUE: Multidetector CT imaging of the head, cervical spine, and maxillofacial structures were performed using the standard protocol without intravenous contrast. Multiplanar CT image reconstructions of the cervical spine and maxillofacial structures were also generated. COMPARISON:  MRI/MRA head 09/27/2017.  Head CT 09/26/2017. FINDINGS: CT HEAD FINDINGS Brain: Mild-to-moderate cerebral atrophy. Redemonstrated densely calcified meningioma overlying the lateral left frontal lobe measuring 1.9 x 1.5 cm (series 5, image 24). Unchanged local mass effect upon the underlying left frontal lobe. No midline shift. Redemonstrated chronic right MCA territory cortical/subcortical infarct within the right frontal, parietal and temporal lobes as well as posterior right insula. Background advanced ill-defined hypoattenuation within the cerebral white matter which is nonspecific, but compatible with chronic small vessel ischemic disease. There are small infarcts within the right cerebellar hemisphere which are new as compared to the brain MRI of 09/27/2017, but otherwise age indeterminate. There is no acute intracranial hemorrhage. No demarcated cortical infarct. No extra-axial fluid collection. Vascular: Atherosclerotic calcifications. Skull: Normal. Negative for fracture or focal lesion. CT MAXILLOFACIAL FINDINGS Mildly motion degraded exam.  Osseous: No acute maxillofacial fracture is identified. Orbits: No  acute finding within the orbits. The globes are normal in size and contour. The extraocular muscles and optic nerve sheath complexes are symmetric and unremarkable. Sinuses: Small volume fluid within the right frontal sinus and scattered within right ethmoid air cells. Air-fluid levels within the bilateral maxillary sinuses. Background mild scattered paranasal sinus mucosal thickening. Soft tissues: Left cheek soft tissue swelling/hematoma. CT CERVICAL SPINE FINDINGS Mildly motion degraded exam. Alignment: Cervical dextrocurvature. Straightening of the expected cervical lordosis. 2 mm C3-C4 grade 1 anterolisthesis. Skull base and vertebrae: The basion-dental and atlanto-dental intervals are maintained.No evidence of acute fracture to the cervical spine. Soft tissues and spinal canal: No prevertebral fluid or swelling. No visible canal hematoma. Disc levels: Cervical spondylosis with multilevel disc space narrowing, disc bulges, uncovertebral hypertrophy and facet arthrosis. Disc space narrowing is severe at C4-C5, C5-C6, C6-C7 and C7-T1. No appreciable high-grade spinal canal stenosis. Multilevel bony neural foraminal narrowing. Facet joint ankylosis on the left at C4-C5. Upper chest: No consolidation within the imaged lung apices. No visible pneumothorax. Partially imaged left pleural effusion. IMPRESSION: CT head: 1. No acute posttraumatic intracranial findings. 2. Small infarcts within the right cerebellar hemisphere are new as compared to the prior brain MRI of 09/27/2017, but otherwise age-indeterminate. Consider a brain MRI for further evaluation. 3. 1.9 x 1.5 cm densely calcified meningioma overlying the lateral left frontal lobe with mild localized mass effect, unchanged. 4. Redemonstrated chronic cortical/subcortical right MCA territory infarct. 5. Stable background generalized cerebral atrophy and advanced chronic small vessel ischemic  disease. CT maxillofacial: 1. Mildly motion degraded exam 2. No evidence of acute maxillofacial fracture. 3. Left cheek soft tissue swelling/hematoma. 4. Paranasal sinus disease with air-fluid levels, as described. Correlate for acute sinusitis. CT cervical spine: 1. Mildly motion degraded exam. 2. No evidence of acute fracture to the cervical spine. 3. Nonspecific straightening of the expected cervical lordosis. 4. Cervical dextrocurvature. 5. 2 mm C3-C4 grade 1 anterolisthesis. 6. Advanced cervical spondylosis as described. This includes degenerative facet joint ankylosis on the left at C4-C5. 7. Partially imaged left pleural effusion. Electronically Signed   By: Kellie Simmering DO   On: 12/10/2020 15:17   CT PELVIS WO CONTRAST  Result Date: 12/14/2020 CLINICAL DATA:  Pelvic trauma Concern for left hip fracture EXAM: CT PELVIS WITHOUT CONTRAST TECHNIQUE: Multidetector CT imaging of the pelvis was performed following the standard protocol without intravenous contrast. COMPARISON:  X-ray hip 12/23/2020, x-ray sacrum 09/15/2020 FINDINGS: Urinary Tract:  No abnormality visualized. Bowel:  Sigmoid diverticulosis. Vascular/Lymphatic: No pathologically enlarged lymph nodes. Severe atherosclerotic plaque otherwise no significant vascular abnormality seen. Reproductive:  No mass or other significant abnormality Other: Trace volume simple free fluid within the pelvis. No free intraperitoneal gas. No organized fluid collection. Mesenteric fat stranding along the hepatic flexure. Suggestion of a cystic lesion in the expected region of the inferior pole of the left kidney (3:1). Musculoskeletal: Marked subcutaneus soft tissue edema of the left hip/flank only partially visualized. Diffusely decreased bone density. Heterogeneous sclerotic appearance of the sacrum may represent degenerative changes/old healed fracture versus focal sclerotic lesion (9: 94-99). No acute displaced fracture. Grade 1 anterolisthesis of L5 on S1 with  severe intervertebral disc space narrowing and vacuum phenomenon, endplate sclerosis, facet arthropathy, possible left L5 pars interarticularis defect. IMPRESSION: 1. Heterogeneous sclerotic appearance of the sacrum may represent healed fracture versus focal sclerotic lesion. Consider MRI for further evaluation. 2. Mesenteric fat stranding along the hepatic flexure as well as trace volume simple free fluid within the pelvis. Consider CT  abdomen with intravenous contrast if clinically indicated. 3. No acute displaced fracture or dislocation of the hips. Marked subcutaneus soft tissue edema of the left hip/flank only partially visualized. 4. No acute displaced fracture or diastasis of the pelvis. 5. Grade 1 anterolisthesis and severe degenerative changes at the L5-S1 level. 6. Aortic Atherosclerosis (ICD10-I70.0). Electronically Signed   By: Iven Finn M.D.   On: 12/16/2020 15:37   DG Hip Unilat With Pelvis 2-3 Views Left  Result Date: 12/14/2020 CLINICAL DATA:  Left knee pain after fall today. EXAM: DG HIP (WITH OR WITHOUT PELVIS) 2-3V LEFT COMPARISON:  None. FINDINGS: There is no evidence of hip fracture or dislocation. There is no evidence of arthropathy or other focal bone abnormality. IMPRESSION: Negative. Electronically Signed   By: Marijo Conception M.D.   On: 11/30/2020 13:56   CT Maxillofacial WO CM  Result Date: 12/19/2020 CLINICAL DATA:  Facial trauma. Neck trauma. Additional history provided: Patient found down by caregiver, left-sided facial swelling present. EXAM: CT HEAD WITHOUT CONTRAST CT MAXILLOFACIAL WITHOUT CONTRAST CT CERVICAL SPINE WITHOUT CONTRAST TECHNIQUE: Multidetector CT imaging of the head, cervical spine, and maxillofacial structures were performed using the standard protocol without intravenous contrast. Multiplanar CT image reconstructions of the cervical spine and maxillofacial structures were also generated. COMPARISON:  MRI/MRA head 09/27/2017.  Head CT 09/26/2017.  FINDINGS: CT HEAD FINDINGS Brain: Mild-to-moderate cerebral atrophy. Redemonstrated densely calcified meningioma overlying the lateral left frontal lobe measuring 1.9 x 1.5 cm (series 5, image 24). Unchanged local mass effect upon the underlying left frontal lobe. No midline shift. Redemonstrated chronic right MCA territory cortical/subcortical infarct within the right frontal, parietal and temporal lobes as well as posterior right insula. Background advanced ill-defined hypoattenuation within the cerebral white matter which is nonspecific, but compatible with chronic small vessel ischemic disease. There are small infarcts within the right cerebellar hemisphere which are new as compared to the brain MRI of 09/27/2017, but otherwise age indeterminate. There is no acute intracranial hemorrhage. No demarcated cortical infarct. No extra-axial fluid collection. Vascular: Atherosclerotic calcifications. Skull: Normal. Negative for fracture or focal lesion. CT MAXILLOFACIAL FINDINGS Mildly motion degraded exam. Osseous: No acute maxillofacial fracture is identified. Orbits: No acute finding within the orbits. The globes are normal in size and contour. The extraocular muscles and optic nerve sheath complexes are symmetric and unremarkable. Sinuses: Small volume fluid within the right frontal sinus and scattered within right ethmoid air cells. Air-fluid levels within the bilateral maxillary sinuses. Background mild scattered paranasal sinus mucosal thickening. Soft tissues: Left cheek soft tissue swelling/hematoma. CT CERVICAL SPINE FINDINGS Mildly motion degraded exam. Alignment: Cervical dextrocurvature. Straightening of the expected cervical lordosis. 2 mm C3-C4 grade 1 anterolisthesis. Skull base and vertebrae: The basion-dental and atlanto-dental intervals are maintained.No evidence of acute fracture to the cervical spine. Soft tissues and spinal canal: No prevertebral fluid or swelling. No visible canal hematoma. Disc  levels: Cervical spondylosis with multilevel disc space narrowing, disc bulges, uncovertebral hypertrophy and facet arthrosis. Disc space narrowing is severe at C4-C5, C5-C6, C6-C7 and C7-T1. No appreciable high-grade spinal canal stenosis. Multilevel bony neural foraminal narrowing. Facet joint ankylosis on the left at C4-C5. Upper chest: No consolidation within the imaged lung apices. No visible pneumothorax. Partially imaged left pleural effusion. IMPRESSION: CT head: 1. No acute posttraumatic intracranial findings. 2. Small infarcts within the right cerebellar hemisphere are new as compared to the prior brain MRI of 09/27/2017, but otherwise age-indeterminate. Consider a brain MRI for further evaluation. 3. 1.9 x 1.5  cm densely calcified meningioma overlying the lateral left frontal lobe with mild localized mass effect, unchanged. 4. Redemonstrated chronic cortical/subcortical right MCA territory infarct. 5. Stable background generalized cerebral atrophy and advanced chronic small vessel ischemic disease. CT maxillofacial: 1. Mildly motion degraded exam 2. No evidence of acute maxillofacial fracture. 3. Left cheek soft tissue swelling/hematoma. 4. Paranasal sinus disease with air-fluid levels, as described. Correlate for acute sinusitis. CT cervical spine: 1. Mildly motion degraded exam. 2. No evidence of acute fracture to the cervical spine. 3. Nonspecific straightening of the expected cervical lordosis. 4. Cervical dextrocurvature. 5. 2 mm C3-C4 grade 1 anterolisthesis. 6. Advanced cervical spondylosis as described. This includes degenerative facet joint ankylosis on the left at C4-C5. 7. Partially imaged left pleural effusion. Electronically Signed   By: Kellie Simmering DO   On: 12/23/2020 15:17    Procedures .Critical Care Performed by: Marcello Fennel, PA-C Authorized by: Marcello Fennel, PA-C   Critical care provider statement:    Critical care time (minutes):  45   Critical care time was  exclusive of:  Separately billable procedures and treating other patients   Critical care was necessary to treat or prevent imminent or life-threatening deterioration of the following conditions:  Renal failure and endocrine crisis   Critical care was time spent personally by me on the following activities:  Discussions with consultants, evaluation of patient's response to treatment, examination of patient, ordering and performing treatments and interventions, ordering and review of laboratory studies, ordering and review of radiographic studies, pulse oximetry, re-evaluation of patient's condition, obtaining history from patient or surrogate and review of old charts   I assumed direction of critical care for this patient from another provider in my specialty: no     Care discussed with: admitting provider       Medications Ordered in ED Medications  sodium zirconium cyclosilicate (LOKELMA) packet 10 g (10 g Oral Given 12/09/2020 1528)  sodium chloride 0.9 % bolus 500 mL (has no administration in time range)  sodium polystyrene (KAYEXALATE) 15 GM/60ML suspension 30 g (has no administration in time range)  sodium bicarbonate injection 50 mEq (has no administration in time range)  fentaNYL (SUBLIMAZE) injection 25 mcg (25 mcg Intravenous Given 12/08/2020 1415)  albuterol (PROVENTIL) (2.5 MG/3ML) 0.083% nebulizer solution 10 mg (7.5 mg Nebulization Given 12/21/2020 1528)  sodium chloride 0.9 % bolus 1,000 mL (0 mLs Intravenous Stopped 11/30/2020 1700)  calcium chloride 1 g in sodium chloride 0.9 % 100 mL IVPB (0 g Intravenous Stopped 11/29/2020 1700)  albuterol (PROVENTIL) (2.5 MG/3ML) 0.083% nebulizer solution (  Given 12/17/2020 1547)    ED Course  I have reviewed the triage vital signs and the nursing notes.  Pertinent labs & imaging results that were available during my care of the patient were reviewed by me and considered in my medical decision making (see chart for details).  Clinical Course as of  12/21/2020 9485  Ludwig Clarks [birth year]  6670 87 year old female w/ dementia, on eliquis, presenting from after being found down by caregiver today.  Caregiver at bedside reports she came by at 10 am as she does daily and found the patient on the ground.  Pt does not recall any events due to dementia.  Bruising to left side of face.  Pt complaining of leg pain and hip pain, although caregiver states this is a chronic issue.  Caregiver reports pt has had worsening dementia and mobility issues over the past months, can barely ambulate now.  Workup included trauma imaging, no acute head injuries, noted sclerotic lesion of the hip without acute fx.  Labs notable for AKI, doublgin of Creatinine with concurrent hyperkalemia, K of 7.0.  ECG without acute ischemic findings or evidence of hyperK.  BUN 103.  Suspect AKI is 2/2 dehydration and has been gradual.  CK only in 300's.  Doubt rhabdo.  HyperK medications ordered.  Anticipate medical admission. Family member (sister) and caregiver open to goals of care discussion and in need of additional home help, possible placement. [MT]    Clinical Course User Index [MT] Trifan, Carola Rhine, MD   MDM Rules/Calculators/A&P                         Initial impression-patient presents after a unwitnessed fall, she is alert, and does not appear to be in acute distress, vital signs reassuring.  Due to poor historian will obtain CT maxillofacial, head, C-spine, left hip, and left knee.  For further evaluation.  Work-up-CBC shows slight leukocytosis of 10.9, shows normocytic anemia appears to be a baseline for patient.  CMP shows sodium of 132, hyperkalemia of 7, bicarb of 17, creatinine 3.65, baseline 1.5, x-ray of left hip and knee negative for acute findings.  CT pelvis shows sclerotic appearance of the sacrum may represent healed fracture versus isolated sclerotic lesion.  Consider MRI for further evaluation.  Mesenteric fat stranding along the hepatic flexure recommend CT abdomen  pelvis if warranted.  CT head reveals small infarcts within the right cerebral cerebral hemisphere new as compared to MRI of 09/2017 age-indeterminate, no acute findings seen on CT maxillofacial, no acute findings seen on CT cervical spine.  Consult patient's potassium continues to stay elevated despite calcium, albuterol, Lokelma, will consult with nephrology for further recommendations.  Spoke with Dr. Royce Macadamia of nephrology, she recommends 1 amp of bicarb, another 500 mL of normal saline, Kayexalate and repeat potassium at 7 for reevaluation.  She will come down and evaluate the patient.  Will admit to medicine for further evaluation.  Spoke with Dr. Roosevelt Locks of the hospitalist team he has accept the patient and will come down evaluate them.  Reassessment patient is noted to be hyperkalemic with acute AKI, suspect this is secondary due poor oral intake.  will start her on hyperkalemia protocol.  Will update patient on findings and recommend admission for further evaluation.  Rule out-I have low suspicion for intracranial bleed or cranial fracture as there is no neuro deficit on my exam, CT imaging negative for acute findings. CT head does show age-indeterminate ischemia, I suspect this is old as she has no focal deficits on my exam.  Low suspicion for fracture of the pelvis or knee as imaging is negative for both.  Low suspicion for ACS as patient denies chest pain, shortness of breath, EKG sinus rhythm without signs of ischemia negative delta troponin.  Low suspicion for UTI or pyelonephritis as patient has no CVA tenderness, urine is negative for signs of infection.  Low suspicion for rhabdo as CK is slightly elevated at 365.  I suspect patient's elevated potassium and AKI are secondary due to poor intake.  Low suspicion for systemic infection as patient is nontoxic-appearing, vital signs reassuring, no obvious infection on exam or within lab work.  Plan-suspect patient's hyperkalemia and AKI secondary due  to poor oral intake.  Dissipate he will need further fluids and slow correction of electrolyte derailment.  Patient care will be transferred to admitting team for  further evaluation.   Final Clinical Impression(s) / ED Diagnoses Final diagnoses:  Fall, initial encounter  Hyperkalemia  AKI (acute kidney injury) [facility])    Rx / DC Orders ED Discharge Orders    None       Aron Baba 12/14/2020 1848    Wyvonnia Dusky, MD 12/23/20 [phone]

## 2020-12-22 NOTE — Progress Notes (Signed)
Mychart message sent: Urine culture negative.

## 2020-12-22 NOTE — Consult Note (Signed)
[facility] Renal Consultation Note  Requesting MD: Wynetta Fines, MD Indication for Consultation:  AKI, hyperkalemia  Chief complaint:  Found down  HPI: Charlene Wade is a 87 y.o. female with a history of CKD, GERD, hypertension, A. Fib, OA, and history of CVA who presented to the hospital after an unwitnessed fall.  She was found on the floor.  Per nursing report she had fallen multiple times at home and caregiver found her this am. Labs were notable for creatinine 3.65, potassium 7.0.  Baseline creatinine is approximately 1.7-1.8.  She has been given Lokelma in the ER.  Per charting by her expressive aphasia and memory loss secondary to the CVA.  Note covid negative.  Per primary team conversation with sister, patient with weight loss and shortness of breath.  I spoke with her sister via phone.  She has a caregiver during the day and at night is alone.  She has a walker.  On Wednesday, her losartan was stopped because her blood pressure was low and her gabapentin was stopped.  Her son had a kidney transplant and was in his 52's when he died.  She saw him go through dialysis and she hated it for him and her sister states that the patient would never want that.   Creatinine, Ser  Date/Time Value Ref Range Status  12/15/2020 01:00 PM 3.65 (H) 0.44 - 1.00 mg/dL Final  03/28/2020 03:46 PM 1.71 (H) 0.40 - 1.20 mg/dL Final  03/01/2020 04:03 PM 1.42 (H) 0.40 - 1.20 mg/dL Final  01/18/2020 03:52 PM 1.13 (H) 0.57 - 1.00 mg/dL Final  02/03/2019 02:49 PM 1.11 0.40 - 1.20 mg/dL Final  07/21/2018 04:33 PM 1.17 0.40 - 1.20 mg/dL Final  06/16/2018 04:56 PM 1.38 (H) 0.40 - 1.20 mg/dL Final  11/04/2017 03:50 PM 1.17 0.40 - 1.20 mg/dL Final  09/28/2017 06:53 AM 1.09 (H) 0.44 - 1.00 mg/dL Final  09/27/2017 01:51 AM 1.05 (H) 0.44 - 1.00 mg/dL Final  09/26/2017 03:27 PM 1.00 0.44 - 1.00 mg/dL Final  09/26/2017 03:04 PM 1.17 (H) 0.44 - 1.00 mg/dL Final  07/08/2017 04:09 PM 1.24 (H) 0.40 - 1.20  mg/dL Final  11/19/2016 03:46 PM 1.05 0.40 - 1.20 mg/dL Final  03/18/2016 03:31 PM 1.00 0.40 - 1.20 mg/dL Final  11/21/2015 12:10 PM 1.12 0.40 - 1.20 mg/dL Final  04/25/2015 11:09 AM 1.24 (H) 0.40 - 1.20 mg/dL Final  10/16/2012 09:38 PM 1.24 (H) 0.50 - 1.10 mg/dL Final  10/14/2012 08:50 AM 0.85 0.50 - 1.10 mg/dL Final  10/13/2012 06:10 AM 0.91 0.50 - 1.10 mg/dL Final  10/12/2012 10:38 PM 0.99 0.50 - 1.10 mg/dL Final  10/12/2012 10:27 PM 1.00 0.50 - 1.10 mg/dL Final     PMHx:   Past Medical History:  Diagnosis Date  . Allergic rhinitis   . Atrial fibrillation ([place]) 09/26/2017   New-onset A. Fib now persistent  . BCC (basal cell carcinoma of skin) 04/10/2015   sees dermatologist  . CKD (chronic kidney disease) stage 3, GFR 30-59 ml/min ([place]) 04/26/2015  . Depression 10/17/2012  . Ectopic pregnancy   . GERD (gastroesophageal reflux disease)    hx hiatal hernia, has seen GI, ENT and allergist in the past  . Hyperlipidemia   . Hypertension   . OA (osteoarthritis)    knees and back, hx DDD, s/p remote lumbar disc surgery  . OAB (overactive bladder) 04/10/2015  . Obesity   . Osteoporosis    on prolia in the past  . Stroke [facility])  sees neurologist, hx memory loss, expressive aphasia  . Venous insufficiency     Past Surgical History:  Procedure Laterality Date  . ABDOMINAL HYSTERECTOMY    . APPENDECTOMY    . BREAST REDUCTION SURGERY    . LAPAROSCOPIC SALPINGOOPHERECTOMY    . NASAL MASS EXCISION     Basal Cell Cancer Excision  . NASAL RECONSTRUCTION    . TEE WITHOUT CARDIOVERSION  10/22/2012   Procedure: TRANSESOPHAGEAL ECHOCARDIOGRAM (TEE);  Surgeon: Josue Hector, MD;  Location[facility] ENDOSCOPY;  Service: Cardiovascular;  Laterality: N/A;    Family Hx:  Family History  Problem Relation Age of Onset  . Hypertension Father   . Hypertension Mother   . Cancer - Lung Brother     Social History:  reports that she has quit smoking. She has never used smokeless tobacco. She  reports that she does not drink alcohol and does not use drugs.  Allergies:  Allergies  Allergen Reactions  . Levaquin [Levofloxacin] Other (See Comments)    Severe intermittent arm pain  . Ace Inhibitors Cough  . Codeine Other (See Comments)    Unknown reaction - didn't feel well?  . Penicillins Other (See Comments)    Caused high fever Has patient had a PCN reaction causing immediate rash, facial/tongue/throat swelling, SOB or lightheadedness with hypotension: No Has patient had a PCN reaction causing severe rash involving mucus membranes or skin necrosis: No Has patient had a PCN reaction that required hospitalization: No Has patient had a PCN reaction occurring within the last 10 years: No If all of the above answers are "NO", then may proceed with Cephalosporin use.  . Sulfa Antibiotics Other (See Comments)    Possibly caused fever  . Sulfasalazine Other (See Comments)    Possibly caused fever    Medications: Prior to Admission medications   Medication Sig Start Date End Date Taking? Authorizing Provider  acetaminophen (TYLENOL) 500 MG tablet Take 1 tablet (500 mg total) by mouth every 6 (six) hours as needed. 09/15/20   Varney Biles, MD  albuterol (VENTOLIN HFA) 108 (90 Base) MCG/ACT inhaler Inhale 2 puffs into the lungs every 6 (six) hours as needed. 08/02/20   Caren Macadam, MD  allopurinol (ZYLOPRIM) 100 MG tablet TAKE 1 TABLET(100 MG) BY MOUTH DAILY 12/13/20   Caren Macadam, MD  calcitonin, salmon, (MIACALCIN/FORTICAL) 200 UNIT/ACT nasal spray  06/26/18   [provider]  carvedilol (COREG) 6.25 MG tablet Take 1 tablet (6.25 mg total) by mouth 2 (two) times daily with a meal. 01/18/20   Lorretta Harp, MD  cetirizine (ZYRTEC) 10 MG tablet Take 10 mg by mouth at bedtime.     [provider]  cholecalciferol (VITAMIN D) 1000 units tablet Take 2,000 Units by mouth 2 (two) times daily.    [provider]  Cyanocobalamin (VITAMIN B12 PO)  Take 1 tablet by mouth daily.     [provider]  denosumab (PROLIA) 60 MG/ML SOLN injection Inject 60 mg into the skin every 6 (six) months. Administer in upper arm, thigh, or abdomen (last injection June or July 2018)    [provider]  diclofenac sodium (VOLTAREN) 1 % GEL Apply 1 application topically 4 (four) times daily as needed (arm pain/bursitis).    [provider]  diltiazem (CARDIZEM CD) 240 MG 24 hr capsule TAKE 1 CAPSULE(240 MG) BY MOUTH DAILY 12/07/20   Koberlein, Junell C, MD  DULoxetine (CYMBALTA) 20 MG capsule TAKE 1 CAPSULE(20 MG) BY MOUTH DAILY 11/01/20  Koberlein, Junell C, MD  ELIQUIS 5 MG TABS tablet TAKE 1 TABLET(5 MG) BY MOUTH TWICE DAILY 08/29/20   Koberlein, Andris Flurry C, MD  fenofibrate (TRICOR) 145 MG tablet TAKE 1 TABLET(145 MG) BY MOUTH DAILY 07/31/20   Koberlein, Junell C, MD  ferrous sulfate 325 (65 FE) MG EC tablet TAKE 1 TABLET(325 MG) BY MOUTH TWICE DAILY BEFORE A MEAL 12/11/20   Shadad, Mathis Dad, MD  fluticasone (FLONASE) 50 MCG/ACT nasal spray SHAKE LIQUID AND USE 1 SPRAY IN EACH NOSTRIL TWICE DAILY 10/30/20   Koberlein, Steele Berg, MD  fluticasone (FLOVENT HFA) 44 MCG/ACT inhaler Inhale 2 puffs into the lungs 2 (two) times daily for 14 days. 09/07/18 09/21/18  Lucretia Kern, DO  furosemide (LASIX) 20 MG tablet TAKE 1 TABLET(20 MG) BY MOUTH DAILY AS NEEDED FOR SWELLING IN LEGS 04/24/20   Koberlein, Andris Flurry C, MD  Lidocaine 4 % PTCH Apply 1 patch topically 2 (two) times daily. 09/15/20   Varney Biles, MD  Multiple Vitamins-Minerals (PRESERVISION AREDS PO) Take 1 tablet by mouth 3 (three) times daily.     [provider]  oxybutynin (DITROPAN-XL) 5 MG 24 hr tablet TAKE 1 TABLET BY MOUTH EVERY DAY 05/26/20   Koberlein, Junell C, MD  pantoprazole (PROTONIX) 20 MG tablet TAKE 1 TABLET(20 MG) BY MOUTH DAILY 10/30/20   Koberlein, Steele Berg, MD  Polyethyl Glycol-Propyl Glycol (SYSTANE OP) Place 1 drop into both eyes 2 (two) times daily as needed (dry  eyes).    [provider]  Spacer/Aero-Holding Chambers (AEROCHAMBER PLUS) inhaler Use as instructed 08/02/20   Caren Macadam, MD  traMADol (ULTRAM) 50 MG tablet Take 1 tablet (50 mg total) by mouth every 6 (six) hours as needed. 07/15/18   Burchette, Alinda Sierras, MD    I have reviewed the patient's current and reported prior to admission medications.  Labs:  BMP Latest Ref Rng & Units 12/04/2020 12/02/2020 08/02/2020  Glucose 70 - 99 mg/dL - 91 138(H)  BUN 8 - 23 mg/dL - 103(H) 27(H)  Creatinine 0.44 - 1.00 mg/dL - 3.65(H) 1.87(H)  BUN/Creat Ratio 6 - 22 (calc) - - 14  Sodium 135 - 145 mmol/L - 132(L) 139  Potassium 3.5 - 5.1 mmol/L 7.0(HH) 7.0(HH) 5.0  Chloride 98 - 111 mmol/L - 105 110  CO2 22 - 32 mmol/L - 17(L) 22  Calcium 8.9 - 10.3 mg/dL - 8.6(L) 8.7    Urinalysis    Component Value Date/Time   COLORURINE YELLOW 12/06/2020 [place] 12/25/2020 1545   LABSPEC 1.014 12/23/2020 1545   PHURINE 5.0 12/05/2020 1545   GLUCOSEU NEGATIVE 11/29/2020 1545   HGBUR MODERATE (A) 12/04/2020 1545   BILIRUBINUR NEGATIVE 12/23/2020 1545   BILIRUBINUR negative 12/20/2020 [place] 12/21/2020 1545   PROTEINUR NEGATIVE 12/02/2020 1545   UROBILINOGEN 0.2 12/20/2020 1601   UROBILINOGEN 0.2 10/16/2012 2210   NITRITE NEGATIVE 12/02/2020 1545   LEUKOCYTESUR NEGATIVE 12/25/2020 1545     ROS: Unable to obtain 2/2 AMS  Physical Exam: Vitals:   12/19/2020 1930 12/04/2020 1945  BP: (!) 106/55 93/61  Pulse: 66 62  Resp: 16 20  Temp:    SpO2: 92% 95%     General: elderly female in stretcher HEENT:  left sided facial abrasions Neck: supple trachea midline Heart: S1S2 rub vs. As? Lungs: crackles vs transmitted upper airway sounds; on 2.5 liters oxygen Abdomen: soft/NT/ND Extremities: trace to 1+ edema Skin: abrasions as above; legs bandaged Neuro: does not follow  commands or answer questions GU foley in place  Assessment/Plan:  # Hyperkalemia -  Discussed with ER - recommended bicarb 1 amp and kayexalate in addition to prior lokelma and NS 500 mL - Spoke with primary and recommend lasix (see lasix 60 mg IV once is ordered) - BMP stat   - repeat bicarb 1 amp and bicarb gtt - note lokelma is ordered daily - continue for now - aggressive medical management.  Patient's sister has stated that she would not want dialysis  # AKI - Ischemic insults and ATN.  There is only a mild elevation in CK and mod Hb in urine with 0-5 RBC and neg protein. Not overtly consistent with rhabdo per labs.  CT pelvis no overt urologic abnormality called - Continue foley  - BMP stat  # Metabolic acidosis - Setting of AKI  - Bicarb as above - amp and bicarb gtt to temporize K  # hypotension - BP 90's on exam; see that albumin is ordered - remain off of ARB  # AMS - stopping cymbalta and oxybutynin  # Unwitnessed fall - Trauma imaging per ER and primary  # Normocytic anemia - mild - Check iron stores   # CKD stage 4 - note baseline Cr 1.7-1.8  Claudia Desanctis 11/29/2020, 9:22 PM

## 2020-12-22 NOTE — ED Notes (Signed)
Patient transported to CT 

## 2020-12-22 NOTE — ED Notes (Signed)
Attempted to call report, unsuccessful, will call back. 

## 2020-12-22 NOTE — H&P (Signed)
History and Physical    Charlene Wade JJK:[id number] DOB: 02-Feb-1928 DOA: 12/03/2020  PCP: Caren Macadam, MD (Confirm with patient/family/NH records and if not entered, this has to be entered at [facility] point of entry) Patient coming from: [facility]  I have personally briefly reviewed patient's old medical records in [place]  Chief Complaint: Fall and knee pain  HPI: Charlene Wade is a 87 y.o. female with medical history significant of chronic A. fib on Eliquis, HTN, CKD stage III, chronic ambulation dysfunction on roller walker, gout, osteoporosis, came with recurrent falls and left knee pain.  Patient lives at home with 24-hour caregiver.  Most history obtained from patient's sister and caregiver over the phone.  Caregiver reported patient has has chronic ambulation dysfunction on walker, however patient tends to develop cough, shortness of breath and recently has had less exercise tolerance for which PCP prescribed albuterol pump.  And patient has had significant decrease of appetite for last 4 to 91-month and lost about 70 pounds.  And patient's PCP has cut down patient's losartan and gabapentin dosage recently due to low blood pressure, and worsening of unsteady gait.  Patient had first fall 2 days ago, no LOC, and current events impression is patient might have tripped.  No significant injury.  Patient sister saw patient yesterday found patient was very unsteady and wobbly but no other complaints.  However this morning patient was found on floor by caregiver, and patient cannot describe what has happened.  Caregiver further reported he checks patient's pulse ox retighten modifications O2 saturation > 95% on room air.  EMS arrived and found patient confused and hypoxic. ED Course: BP on lower side, hypoxic 88% room air, stabilized on 3 L.,  Trauma Scan negative except CT head accidental finding of a suspected small right cerebellar infarct.  Blood work cyclically AKI creatinine 3.6  compared to baseline 1.4-1.8, potassium 7.0.  Review of Systems: Unable to perform, patient confused   Past Medical History:  Diagnosis Date  . Allergic rhinitis   . Atrial fibrillation ([place]) 09/26/2017   New-onset A. Fib now persistent  . BCC (basal cell carcinoma of skin) 04/10/2015   sees dermatologist  . CKD (chronic kidney disease) stage 3, GFR 30-59 ml/min ([place]) 04/26/2015  . Depression 10/17/2012  . Ectopic pregnancy   . GERD (gastroesophageal reflux disease)    hx hiatal hernia, has seen GI, ENT and allergist in the past  . Hyperlipidemia   . Hypertension   . OA (osteoarthritis)    knees and back, hx DDD, s/p remote lumbar disc surgery  . OAB (overactive bladder) 04/10/2015  . Obesity   . Osteoporosis    on prolia in the past  . Stroke [facility])    sees neurologist, hx memory loss, expressive aphasia  . Venous insufficiency     Past Surgical History:  Procedure Laterality Date  . ABDOMINAL HYSTERECTOMY    . APPENDECTOMY    . BREAST REDUCTION SURGERY    . LAPAROSCOPIC SALPINGOOPHERECTOMY    . NASAL MASS EXCISION     Basal Cell Cancer Excision  . NASAL RECONSTRUCTION    . TEE WITHOUT CARDIOVERSION  10/22/2012   Procedure: TRANSESOPHAGEAL ECHOCARDIOGRAM (TEE);  Surgeon: Josue Hector, MD;  Location: [facility] ENDOSCOPY;  Service: Cardiovascular;  Laterality: N/A;     reports that she has quit smoking. She has never used smokeless tobacco. She reports that she does not drink alcohol and does not use drugs.  Allergies  Allergen Reactions  .  Levaquin [Levofloxacin] Other (See Comments)    Severe intermittent arm pain  . Ace Inhibitors Cough  . Codeine Other (See Comments)    Unknown reaction - didn't feel well?  . Penicillins Other (See Comments)    Caused high fever Has patient had a PCN reaction causing immediate rash, facial/tongue/throat swelling, SOB or lightheadedness with hypotension: No Has patient had a PCN reaction causing severe rash involving mucus membranes or  skin necrosis: No Has patient had a PCN reaction that required hospitalization: No Has patient had a PCN reaction occurring within the last 10 years: No If all of the above answers are "NO", then may proceed with Cephalosporin use.  . Sulfa Antibiotics Other (See Comments)    Possibly caused fever  . Sulfasalazine Other (See Comments)    Possibly caused fever    Family History  Problem Relation Age of Onset  . Hypertension Father   . Hypertension Mother   . Cancer - Lung Brother      Prior to Admission medications   Medication Sig Start Date End Date Taking? Authorizing Provider  acetaminophen (TYLENOL) 500 MG tablet Take 1 tablet (500 mg total) by mouth every 6 (six) hours as needed. 09/15/20   Varney Biles, MD  albuterol (VENTOLIN HFA) 108 (90 Base) MCG/ACT inhaler Inhale 2 puffs into the lungs every 6 (six) hours as needed. 08/02/20   Caren Macadam, MD  allopurinol (ZYLOPRIM) 100 MG tablet TAKE 1 TABLET(100 MG) BY MOUTH DAILY 12/13/20   Caren Macadam, MD  calcitonin, salmon, (MIACALCIN/FORTICAL) 200 UNIT/ACT nasal spray  06/26/18   [provider]  carvedilol (COREG) 6.25 MG tablet Take 1 tablet (6.25 mg total) by mouth 2 (two) times daily with a meal. 01/18/20   Lorretta Harp, MD  cetirizine (ZYRTEC) 10 MG tablet Take 10 mg by mouth at bedtime.     [provider]  cholecalciferol (VITAMIN D) 1000 units tablet Take 2,000 Units by mouth 2 (two) times daily.    [provider]  Cyanocobalamin (VITAMIN B12 PO) Take 1 tablet by mouth daily.     [provider]  denosumab (PROLIA) 60 MG/ML SOLN injection Inject 60 mg into the skin every 6 (six) months. Administer in upper arm, thigh, or abdomen (last injection June or July 2018)    [provider]  diclofenac sodium (VOLTAREN) 1 % GEL Apply 1 application topically 4 (four) times daily as needed (arm pain/bursitis).    [provider]  diltiazem (CARDIZEM CD) 240 MG 24  hr capsule TAKE 1 CAPSULE(240 MG) BY MOUTH DAILY 12/07/20   Koberlein, Junell C, MD  DULoxetine (CYMBALTA) 20 MG capsule TAKE 1 CAPSULE(20 MG) BY MOUTH DAILY 11/01/20   Koberlein, Junell C, MD  ELIQUIS 5 MG TABS tablet TAKE 1 TABLET(5 MG) BY MOUTH TWICE DAILY 08/29/20   Koberlein, Andris Flurry C, MD  fenofibrate (TRICOR) 145 MG tablet TAKE 1 TABLET(145 MG) BY MOUTH DAILY 07/31/20   Koberlein, Junell C, MD  ferrous sulfate 325 (65 FE) MG EC tablet TAKE 1 TABLET(325 MG) BY MOUTH TWICE DAILY BEFORE A MEAL 12/11/20   Shadad, Mathis Dad, MD  fluticasone (FLONASE) 50 MCG/ACT nasal spray SHAKE LIQUID AND USE 1 SPRAY IN EACH NOSTRIL TWICE DAILY 10/30/20   Koberlein, Steele Berg, MD  fluticasone (FLOVENT HFA) 44 MCG/ACT inhaler Inhale 2 puffs into the lungs 2 (two) times daily for 14 days. 09/07/18 09/21/18  Lucretia Kern, DO  furosemide (LASIX) 20 MG tablet TAKE 1 TABLET(20 MG) BY  MOUTH DAILY AS NEEDED FOR SWELLING IN LEGS 04/24/20   Koberlein, Andris Flurry C, MD  Lidocaine 4 % PTCH Apply 1 patch topically 2 (two) times daily. 09/15/20   Varney Biles, MD  Multiple Vitamins-Minerals (PRESERVISION AREDS PO) Take 1 tablet by mouth 3 (three) times daily.     [provider]  oxybutynin (DITROPAN-XL) 5 MG 24 hr tablet TAKE 1 TABLET BY MOUTH EVERY DAY 05/26/20   Koberlein, Junell C, MD  pantoprazole (PROTONIX) 20 MG tablet TAKE 1 TABLET(20 MG) BY MOUTH DAILY 10/30/20   Koberlein, Steele Berg, MD  Polyethyl Glycol-Propyl Glycol (SYSTANE OP) Place 1 drop into both eyes 2 (two) times daily as needed (dry eyes).    [provider]  Spacer/Aero-Holding Chambers (AEROCHAMBER PLUS) inhaler Use as instructed 08/02/20   Caren Macadam, MD  traMADol (ULTRAM) 50 MG tablet Take 1 tablet (50 mg total) by mouth every 6 (six) hours as needed. 07/15/18   Eulas Post, MD    Physical Exam: Vitals:   12/12/2020 1845 12/03/2020 1905 12/02/2020 1930 12/04/2020 1945  BP: (!) 106/46 110/70 (!) 106/55 93/61  Pulse: 67 (!) 57 66 62  Resp: 18  (!) 21 16 20   Temp:      TempSrc:      SpO2: 100% 99% 92% 95%  Weight:      Height:        Constitutional: NAD, calm, comfortable Vitals:   12/14/2020 1845 12/08/2020 1905 12/06/2020 1930 12/13/2020 1945  BP: (!) 106/46 110/70 (!) 106/55 93/61  Pulse: 67 (!) 57 66 62  Resp: 18 (!) 21 16 20   Temp:      TempSrc:      SpO2: 100% 99% 92% 95%  Weight:      Height:       Eyes: PERRL, lids and conjunctivae normal ENMT: Mucous membranes are dry. Posterior pharynx clear of any exudate or lesions.Normal dentition.  Neck: normal, supple, no masses, no thyromegaly Respiratory: clear to auscultation bilaterally, no wheezing, no crackles. Normal respiratory effort. No accessory muscle use.  Cardiovascular: Regular rate and rhythm, diminished I5-O2, loud systolic murmur on heart base. 2+ extremity edema. 2+ pedal pulses. No carotid bruits.  Abdomen: no tenderness, no masses palpated. No hepatosplenomegaly. Bowel sounds positive.  Musculoskeletal: no clubbing / cyanosis. No joint deformity upper and lower extremities. Good ROM, no contractures. Normal muscle tone.  Skin: Right mid shin ulcer and cellulitis changes around Neurologic: No facial droop, open eyes spontaneously, following simple command, moving all limbs Psychiatric: Arousable, very confused    Labs on Admission: I have personally reviewed following labs and imaging studies  CBC: Recent Labs  Lab 12/21/2020 1300  WBC 10.9*  NEUTROABS 9.6*  HGB 10.8*  HCT 37.2  MCV 95.9  PLT 774   Basic Metabolic Panel: Recent Labs  Lab 12/25/2020 1300 12/21/2020 1614  NA 132*  --   K 7.0* 7.0*  CL 105  --   CO2 17*  --   GLUCOSE 91  --   BUN 103*  --   CREATININE 3.65*  --   CALCIUM 8.6*  --    GFR: Estimated Creatinine Clearance: 5.8 mL/min (A) (by C-G formula based on SCr of 3.65 mg/dL (H)). Liver Function Tests: Recent Labs  Lab 12/25/2020 1300  AST 74*  ALT 26  ALKPHOS 41  BILITOT 1.2  PROT 5.7*  ALBUMIN 2.8*   No results for  input(s): LIPASE, AMYLASE in the last 168 hours. No results for input(s): AMMONIA in  the last 168 hours. Coagulation Profile: No results for input(s): INR, PROTIME in the last 168 hours. Cardiac Enzymes: Recent Labs  Lab 12/02/2020 1545  CKTOTAL 365*   BNP (last 3 results) No results for input(s): PROBNP in the last 8760 hours. HbA1C: No results for input(s): HGBA1C in the last 72 hours. CBG: No results for input(s): GLUCAP in the last 168 hours. Lipid Profile: No results for input(s): CHOL, HDL, LDLCALC, TRIG, CHOLHDL, LDLDIRECT in the last 72 hours. Thyroid Function Tests: No results for input(s): TSH, T4TOTAL, FREET4, T3FREE, THYROIDAB in the last 72 hours. Anemia Panel: No results for input(s): VITAMINB12, FOLATE, FERRITIN, TIBC, IRON, RETICCTPCT in the last 72 hours. Urine analysis:    Component Value Date/Time   COLORURINE YELLOW 12/15/2020 [place] 12/25/2020 1545   LABSPEC 1.014 12/18/2020 1545   PHURINE 5.0 12/02/2020 1545   GLUCOSEU NEGATIVE 12/07/2020 1545   HGBUR MODERATE (A) 12/04/2020 1545   BILIRUBINUR NEGATIVE 12/08/2020 1545   BILIRUBINUR negative 12/20/2020 [place] 12/21/2020 1545   PROTEINUR NEGATIVE 12/14/2020 1545   UROBILINOGEN 0.2 12/20/2020 1601   UROBILINOGEN 0.2 10/16/2012 2210   NITRITE NEGATIVE 12/20/2020 1545   LEUKOCYTESUR NEGATIVE 12/11/2020 1545    Radiological Exams on Admission: DG Knee 2 Views Left  Result Date: 12/07/2020 CLINICAL DATA:  Left knee pain after fall today. EXAM: LEFT KNEE - 1-2 VIEW COMPARISON:  None. FINDINGS: No evidence of fracture, dislocation, or joint effusion. Severe narrowing of medial joint space is noted. Moderate narrowing of patellofemoral space is noted. Soft tissues are unremarkable. IMPRESSION: Severe degenerative joint disease is noted medially. No acute abnormality seen in the left knee. Electronically Signed   By: Marijo Conception M.D.   On: 12/01/2020 13:57   CT Head Wo  Contrast  Result Date: 12/23/2020 CLINICAL DATA:  Facial trauma. Neck trauma. Additional history provided: Patient found down by caregiver, left-sided facial swelling present. EXAM: CT HEAD WITHOUT CONTRAST CT MAXILLOFACIAL WITHOUT CONTRAST CT CERVICAL SPINE WITHOUT CONTRAST TECHNIQUE: Multidetector CT imaging of the head, cervical spine, and maxillofacial structures were performed using the standard protocol without intravenous contrast. Multiplanar CT image reconstructions of the cervical spine and maxillofacial structures were also generated. COMPARISON:  MRI/MRA head 09/27/2017.  Head CT 09/26/2017. FINDINGS: CT HEAD FINDINGS Brain: Mild-to-moderate cerebral atrophy. Redemonstrated densely calcified meningioma overlying the lateral left frontal lobe measuring 1.9 x 1.5 cm (series 5, image 24). Unchanged local mass effect upon the underlying left frontal lobe. No midline shift. Redemonstrated chronic right MCA territory cortical/subcortical infarct within the right frontal, parietal and temporal lobes as well as posterior right insula. Background advanced ill-defined hypoattenuation within the cerebral white matter which is nonspecific, but compatible with chronic small vessel ischemic disease. There are small infarcts within the right cerebellar hemisphere which are new as compared to the brain MRI of 09/27/2017, but otherwise age indeterminate. There is no acute intracranial hemorrhage. No demarcated cortical infarct. No extra-axial fluid collection. Vascular: Atherosclerotic calcifications. Skull: Normal. Negative for fracture or focal lesion. CT MAXILLOFACIAL FINDINGS Mildly motion degraded exam. Osseous: No acute maxillofacial fracture is identified. Orbits: No acute finding within the orbits. The globes are normal in size and contour. The extraocular muscles and optic nerve sheath complexes are symmetric and unremarkable. Sinuses: Small volume fluid within the right frontal sinus and scattered within  right ethmoid air cells. Air-fluid levels within the bilateral maxillary sinuses. Background mild scattered paranasal sinus mucosal thickening. Soft tissues: Left cheek soft tissue swelling/hematoma.  CT CERVICAL SPINE FINDINGS Mildly motion degraded exam. Alignment: Cervical dextrocurvature. Straightening of the expected cervical lordosis. 2 mm C3-C4 grade 1 anterolisthesis. Skull base and vertebrae: The basion-dental and atlanto-dental intervals are maintained.No evidence of acute fracture to the cervical spine. Soft tissues and spinal canal: No prevertebral fluid or swelling. No visible canal hematoma. Disc levels: Cervical spondylosis with multilevel disc space narrowing, disc bulges, uncovertebral hypertrophy and facet arthrosis. Disc space narrowing is severe at C4-C5, C5-C6, C6-C7 and C7-T1. No appreciable high-grade spinal canal stenosis. Multilevel bony neural foraminal narrowing. Facet joint ankylosis on the left at C4-C5. Upper chest: No consolidation within the imaged lung apices. No visible pneumothorax. Partially imaged left pleural effusion. IMPRESSION: CT head: 1. No acute posttraumatic intracranial findings. 2. Small infarcts within the right cerebellar hemisphere are new as compared to the prior brain MRI of 09/27/2017, but otherwise age-indeterminate. Consider a brain MRI for further evaluation. 3. 1.9 x 1.5 cm densely calcified meningioma overlying the lateral left frontal lobe with mild localized mass effect, unchanged. 4. Redemonstrated chronic cortical/subcortical right MCA territory infarct. 5. Stable background generalized cerebral atrophy and advanced chronic small vessel ischemic disease. CT maxillofacial: 1. Mildly motion degraded exam 2. No evidence of acute maxillofacial fracture. 3. Left cheek soft tissue swelling/hematoma. 4. Paranasal sinus disease with air-fluid levels, as described. Correlate for acute sinusitis. CT cervical spine: 1. Mildly motion degraded exam. 2. No evidence of  acute fracture to the cervical spine. 3. Nonspecific straightening of the expected cervical lordosis. 4. Cervical dextrocurvature. 5. 2 mm C3-C4 grade 1 anterolisthesis. 6. Advanced cervical spondylosis as described. This includes degenerative facet joint ankylosis on the left at C4-C5. 7. Partially imaged left pleural effusion. Electronically Signed   By: Kellie Simmering DO   On: 12/04/2020 15:17   CT Cervical Spine Wo Contrast  Result Date: 12/23/2020 CLINICAL DATA:  Facial trauma. Neck trauma. Additional history provided: Patient found down by caregiver, left-sided facial swelling present. EXAM: CT HEAD WITHOUT CONTRAST CT MAXILLOFACIAL WITHOUT CONTRAST CT CERVICAL SPINE WITHOUT CONTRAST TECHNIQUE: Multidetector CT imaging of the head, cervical spine, and maxillofacial structures were performed using the standard protocol without intravenous contrast. Multiplanar CT image reconstructions of the cervical spine and maxillofacial structures were also generated. COMPARISON:  MRI/MRA head 09/27/2017.  Head CT 09/26/2017. FINDINGS: CT HEAD FINDINGS Brain: Mild-to-moderate cerebral atrophy. Redemonstrated densely calcified meningioma overlying the lateral left frontal lobe measuring 1.9 x 1.5 cm (series 5, image 24). Unchanged local mass effect upon the underlying left frontal lobe. No midline shift. Redemonstrated chronic right MCA territory cortical/subcortical infarct within the right frontal, parietal and temporal lobes as well as posterior right insula. Background advanced ill-defined hypoattenuation within the cerebral white matter which is nonspecific, but compatible with chronic small vessel ischemic disease. There are small infarcts within the right cerebellar hemisphere which are new as compared to the brain MRI of 09/27/2017, but otherwise age indeterminate. There is no acute intracranial hemorrhage. No demarcated cortical infarct. No extra-axial fluid collection. Vascular: Atherosclerotic calcifications.  Skull: Normal. Negative for fracture or focal lesion. CT MAXILLOFACIAL FINDINGS Mildly motion degraded exam. Osseous: No acute maxillofacial fracture is identified. Orbits: No acute finding within the orbits. The globes are normal in size and contour. The extraocular muscles and optic nerve sheath complexes are symmetric and unremarkable. Sinuses: Small volume fluid within the right frontal sinus and scattered within right ethmoid air cells. Air-fluid levels within the bilateral maxillary sinuses. Background mild scattered paranasal sinus mucosal thickening. Soft tissues: Left cheek soft  tissue swelling/hematoma. CT CERVICAL SPINE FINDINGS Mildly motion degraded exam. Alignment: Cervical dextrocurvature. Straightening of the expected cervical lordosis. 2 mm C3-C4 grade 1 anterolisthesis. Skull base and vertebrae: The basion-dental and atlanto-dental intervals are maintained.No evidence of acute fracture to the cervical spine. Soft tissues and spinal canal: No prevertebral fluid or swelling. No visible canal hematoma. Disc levels: Cervical spondylosis with multilevel disc space narrowing, disc bulges, uncovertebral hypertrophy and facet arthrosis. Disc space narrowing is severe at C4-C5, C5-C6, C6-C7 and C7-T1. No appreciable high-grade spinal canal stenosis. Multilevel bony neural foraminal narrowing. Facet joint ankylosis on the left at C4-C5. Upper chest: No consolidation within the imaged lung apices. No visible pneumothorax. Partially imaged left pleural effusion. IMPRESSION: CT head: 1. No acute posttraumatic intracranial findings. 2. Small infarcts within the right cerebellar hemisphere are new as compared to the prior brain MRI of 09/27/2017, but otherwise age-indeterminate. Consider a brain MRI for further evaluation. 3. 1.9 x 1.5 cm densely calcified meningioma overlying the lateral left frontal lobe with mild localized mass effect, unchanged. 4. Redemonstrated chronic cortical/subcortical right MCA  territory infarct. 5. Stable background generalized cerebral atrophy and advanced chronic small vessel ischemic disease. CT maxillofacial: 1. Mildly motion degraded exam 2. No evidence of acute maxillofacial fracture. 3. Left cheek soft tissue swelling/hematoma. 4. Paranasal sinus disease with air-fluid levels, as described. Correlate for acute sinusitis. CT cervical spine: 1. Mildly motion degraded exam. 2. No evidence of acute fracture to the cervical spine. 3. Nonspecific straightening of the expected cervical lordosis. 4. Cervical dextrocurvature. 5. 2 mm C3-C4 grade 1 anterolisthesis. 6. Advanced cervical spondylosis as described. This includes degenerative facet joint ankylosis on the left at C4-C5. 7. Partially imaged left pleural effusion. Electronically Signed   By: Kellie Simmering DO   On: 12/08/2020 15:17   CT PELVIS WO CONTRAST  Result Date: 12/07/2020 CLINICAL DATA:  Pelvic trauma Concern for left hip fracture EXAM: CT PELVIS WITHOUT CONTRAST TECHNIQUE: Multidetector CT imaging of the pelvis was performed following the standard protocol without intravenous contrast. COMPARISON:  X-ray hip 12/06/2020, x-ray sacrum 09/15/2020 FINDINGS: Urinary Tract:  No abnormality visualized. Bowel:  Sigmoid diverticulosis. Vascular/Lymphatic: No pathologically enlarged lymph nodes. Severe atherosclerotic plaque otherwise no significant vascular abnormality seen. Reproductive:  No mass or other significant abnormality Other: Trace volume simple free fluid within the pelvis. No free intraperitoneal gas. No organized fluid collection. Mesenteric fat stranding along the hepatic flexure. Suggestion of a cystic lesion in the expected region of the inferior pole of the left kidney (3:1). Musculoskeletal: Marked subcutaneus soft tissue edema of the left hip/flank only partially visualized. Diffusely decreased bone density. Heterogeneous sclerotic appearance of the sacrum may represent degenerative changes/old healed fracture  versus focal sclerotic lesion (9: 94-99). No acute displaced fracture. Grade 1 anterolisthesis of L5 on S1 with severe intervertebral disc space narrowing and vacuum phenomenon, endplate sclerosis, facet arthropathy, possible left L5 pars interarticularis defect. IMPRESSION: 1. Heterogeneous sclerotic appearance of the sacrum may represent healed fracture versus focal sclerotic lesion. Consider MRI for further evaluation. 2. Mesenteric fat stranding along the hepatic flexure as well as trace volume simple free fluid within the pelvis. Consider CT abdomen with intravenous contrast if clinically indicated. 3. No acute displaced fracture or dislocation of the hips. Marked subcutaneus soft tissue edema of the left hip/flank only partially visualized. 4. No acute displaced fracture or diastasis of the pelvis. 5. Grade 1 anterolisthesis and severe degenerative changes at the L5-S1 level. 6. Aortic Atherosclerosis (ICD10-I70.0). Electronically Signed   By:  Iven Finn M.D.   On: 12/17/2020 15:37   DG Hip Unilat With Pelvis 2-3 Views Left  Result Date: 12/09/2020 CLINICAL DATA:  Left knee pain after fall today. EXAM: DG HIP (WITH OR WITHOUT PELVIS) 2-3V LEFT COMPARISON:  None. FINDINGS: There is no evidence of hip fracture or dislocation. There is no evidence of arthropathy or other focal bone abnormality. IMPRESSION: Negative. Electronically Signed   By: Marijo Conception M.D.   On: 12/23/2020 13:56   CT Maxillofacial WO CM  Result Date: 12/02/2020 CLINICAL DATA:  Facial trauma. Neck trauma. Additional history provided: Patient found down by caregiver, left-sided facial swelling present. EXAM: CT HEAD WITHOUT CONTRAST CT MAXILLOFACIAL WITHOUT CONTRAST CT CERVICAL SPINE WITHOUT CONTRAST TECHNIQUE: Multidetector CT imaging of the head, cervical spine, and maxillofacial structures were performed using the standard protocol without intravenous contrast. Multiplanar CT image reconstructions of the cervical spine and  maxillofacial structures were also generated. COMPARISON:  MRI/MRA head 09/27/2017.  Head CT 09/26/2017. FINDINGS: CT HEAD FINDINGS Brain: Mild-to-moderate cerebral atrophy. Redemonstrated densely calcified meningioma overlying the lateral left frontal lobe measuring 1.9 x 1.5 cm (series 5, image 24). Unchanged local mass effect upon the underlying left frontal lobe. No midline shift. Redemonstrated chronic right MCA territory cortical/subcortical infarct within the right frontal, parietal and temporal lobes as well as posterior right insula. Background advanced ill-defined hypoattenuation within the cerebral white matter which is nonspecific, but compatible with chronic small vessel ischemic disease. There are small infarcts within the right cerebellar hemisphere which are new as compared to the brain MRI of 09/27/2017, but otherwise age indeterminate. There is no acute intracranial hemorrhage. No demarcated cortical infarct. No extra-axial fluid collection. Vascular: Atherosclerotic calcifications. Skull: Normal. Negative for fracture or focal lesion. CT MAXILLOFACIAL FINDINGS Mildly motion degraded exam. Osseous: No acute maxillofacial fracture is identified. Orbits: No acute finding within the orbits. The globes are normal in size and contour. The extraocular muscles and optic nerve sheath complexes are symmetric and unremarkable. Sinuses: Small volume fluid within the right frontal sinus and scattered within right ethmoid air cells. Air-fluid levels within the bilateral maxillary sinuses. Background mild scattered paranasal sinus mucosal thickening. Soft tissues: Left cheek soft tissue swelling/hematoma. CT CERVICAL SPINE FINDINGS Mildly motion degraded exam. Alignment: Cervical dextrocurvature. Straightening of the expected cervical lordosis. 2 mm C3-C4 grade 1 anterolisthesis. Skull base and vertebrae: The basion-dental and atlanto-dental intervals are maintained.No evidence of acute fracture to the cervical  spine. Soft tissues and spinal canal: No prevertebral fluid or swelling. No visible canal hematoma. Disc levels: Cervical spondylosis with multilevel disc space narrowing, disc bulges, uncovertebral hypertrophy and facet arthrosis. Disc space narrowing is severe at C4-C5, C5-C6, C6-C7 and C7-T1. No appreciable high-grade spinal canal stenosis. Multilevel bony neural foraminal narrowing. Facet joint ankylosis on the left at C4-C5. Upper chest: No consolidation within the imaged lung apices. No visible pneumothorax. Partially imaged left pleural effusion. IMPRESSION: CT head: 1. No acute posttraumatic intracranial findings. 2. Small infarcts within the right cerebellar hemisphere are new as compared to the prior brain MRI of 09/27/2017, but otherwise age-indeterminate. Consider a brain MRI for further evaluation. 3. 1.9 x 1.5 cm densely calcified meningioma overlying the lateral left frontal lobe with mild localized mass effect, unchanged. 4. Redemonstrated chronic cortical/subcortical right MCA territory infarct. 5. Stable background generalized cerebral atrophy and advanced chronic small vessel ischemic disease. CT maxillofacial: 1. Mildly motion degraded exam 2. No evidence of acute maxillofacial fracture. 3. Left cheek soft tissue swelling/hematoma. 4. Paranasal sinus  disease with air-fluid levels, as described. Correlate for acute sinusitis. CT cervical spine: 1. Mildly motion degraded exam. 2. No evidence of acute fracture to the cervical spine. 3. Nonspecific straightening of the expected cervical lordosis. 4. Cervical dextrocurvature. 5. 2 mm C3-C4 grade 1 anterolisthesis. 6. Advanced cervical spondylosis as described. This includes degenerative facet joint ankylosis on the left at C4-C5. 7. Partially imaged left pleural effusion. Electronically Signed   By: Kellie Simmering DO   On: 12/19/2020 15:17    EKG: Independently reviewed.  A. fib, low voltage  Assessment/Plan Active Problems:   Hyperkalemia   AKI  (acute kidney injury) ([place])  (please populate well all problems here in Problem List. (For example, if patient is on BP meds at home and you resume or decide to hold them, it is a problem that needs to be her. Same for CAD, COPD, HLD and so on)  Hyperkalemia -Secondary to AKI -Nephrology consulted -Patient received total of 1.5 L of IV boluses, 1 bicarb and cardiac surgery x1 and repeat potassium level remain the same, will give 1 dose of calcium gluconate and cocktail of insulin and dextrose, start bicarb drip.  Foley placed in. -D/W Nephro attending, we all agreed with one dose of lasix to address both hyperkalemia and fluid overload. -Condition critical, given multiorgan failure (AKI and CHF and hyperkalemia), explained to sister who understood.  AKI -Clinically hypokalemia/dehydration, hopefully not ATN -Foley placement, rest of management as mentioned above. -Decide to use Albumin for hydration given her volume overload condition.  Acute hypoxic respiratory failure -Chest x-ray showed CHF with pulmon congestion -Close monitoring of volume status -Clinically suspect this is a new onset of CHF vs valvular heart disease as shown on the physical exam.  Likely patient has a moderate to severe aortic stenosis.  Compared her echo in 2018 showed normal aortic valve and mild mitral regurg. -However diuresis contraindicated currently. -Echo in AM.  Stroke of indeterminate age -Doubt patient will collaborate MRI study, hold off MRI for now -Given pt already on Eliquis and her advanced age and comorbidities, further stroke work up probably will not benefit much.  Heart murmur -As above.  Right leg ulcer and cellulitis -Doubt this much cellwill cause sepsis but will check lactic acid. -Ceftriaxone for now.  Afib -As BP borderline low, will change rate control meds to PRN -Cut down Eliquis dose  Weight loss and moderate protein calorie malnutrition -Consult dietitian  DVT prophylaxis:  Eliquis Code Status: Partial, positive for chest compression but no intubation Family Communication: Sister and Caregiver Disposition Plan: Sick, expect 3-5 days hospital stay Consults called: Nephro Admission status: PCU   Lequita Halt MD [facility] Pager [phone]  12/21/2020, 7:51 PM

## 2020-12-22 NOTE — ED Notes (Signed)
Lab to add on magnesium  

## 2020-12-23 ENCOUNTER — Other Ambulatory Visit (HOSPITAL_COMMUNITY): Payer: Medicare Other

## 2020-12-23 DIAGNOSIS — E875 Hyperkalemia: Secondary | ICD-10-CM | POA: Diagnosis not present

## 2020-12-23 DIAGNOSIS — Z66 Do not resuscitate: Secondary | ICD-10-CM

## 2020-12-23 DIAGNOSIS — W19XXXA Unspecified fall, initial encounter: Secondary | ICD-10-CM | POA: Diagnosis not present

## 2020-12-23 DIAGNOSIS — R0902 Hypoxemia: Secondary | ICD-10-CM | POA: Diagnosis not present

## 2020-12-23 DIAGNOSIS — J9601 Acute respiratory failure with hypoxia: Secondary | ICD-10-CM

## 2020-12-23 DIAGNOSIS — Z515 Encounter for palliative care: Secondary | ICD-10-CM

## 2020-12-23 DIAGNOSIS — E8770 Fluid overload, unspecified: Secondary | ICD-10-CM

## 2020-12-23 DIAGNOSIS — I509 Heart failure, unspecified: Secondary | ICD-10-CM

## 2020-12-23 DIAGNOSIS — N179 Acute kidney failure, unspecified: Secondary | ICD-10-CM

## 2020-12-23 DIAGNOSIS — Z7189 Other specified counseling: Secondary | ICD-10-CM

## 2020-12-23 LAB — BASIC METABOLIC PANEL
Anion gap: 12 (ref 5–15)
Anion gap: 12 (ref 5–15)
BUN: 99 mg/dL — ABNORMAL HIGH (ref 8–23)
BUN: 91 mg/dL — ABNORMAL HIGH (ref 8–23)
CO2: 17 mmol/L — ABNORMAL LOW (ref 22–32)
CO2: 17 mmol/L — ABNORMAL LOW (ref 22–32)
Calcium: 8.5 mg/dL — ABNORMAL LOW (ref 8.9–10.3)
Calcium: 8.9 mg/dL (ref 8.9–10.3)
Chloride: 108 mmol/L (ref 98–111)
Chloride: 108 mmol/L (ref 98–111)
Creatinine, Ser: 3.53 mg/dL — ABNORMAL HIGH (ref 0.44–1.00)
Creatinine, Ser: 3.46 mg/dL — ABNORMAL HIGH (ref 0.44–1.00)
GFR, Estimated: 12 mL/min — ABNORMAL LOW (ref >60)
GFR, Estimated: 12 mL/min — ABNORMAL LOW (ref >60)
Glucose, Bld: 127 mg/dL — ABNORMAL HIGH (ref 70–99)
Glucose, Bld: 97 mg/dL (ref 70–99)
Potassium: 6.2 mmol/L — ABNORMAL HIGH (ref 3.5–5.1)
Potassium: 5.7 mmol/L — ABNORMAL HIGH (ref 3.5–5.1)
Sodium: 137 mmol/L (ref 135–145)
Sodium: 137 mmol/L (ref 135–145)

## 2020-12-23 LAB — CBC
HCT: 32.2 % — ABNORMAL LOW (ref 36.0–46.0)
Hemoglobin: 9.8 g/dL — ABNORMAL LOW (ref 12.0–15.0)
MCH: 28.9 pg (ref 26.0–34.0)
MCHC: 30.4 g/dL (ref 30.0–36.0)
MCV: 95 fL (ref 80.0–100.0)
Platelets: 214 10*3/uL (ref 150–400)
RBC: 3.39 MIL/uL — ABNORMAL LOW (ref 3.87–5.11)
RDW: 19.5 % — ABNORMAL HIGH (ref 11.5–15.5)
WBC: 8.2 10*3/uL (ref 4.0–10.5)
nRBC: 1.7 % — ABNORMAL HIGH (ref 0.0–0.2)

## 2020-12-23 LAB — IRON AND TIBC
Iron: 22 ug/dL — ABNORMAL LOW (ref 28–170)
Saturation Ratios: 6 % — ABNORMAL LOW (ref 10.4–31.8)
TIBC: 368 ug/dL (ref 250–450)
UIBC: 346 ug/dL

## 2020-12-23 MED ORDER — CHLORHEXIDINE GLUCONATE CLOTH 2 % EX PADS
6.0000 | MEDICATED_PAD | Freq: Every day | CUTANEOUS | Status: DC
  Administered 2020-12-23: 6 via TOPICAL

## 2020-12-23 MED ORDER — SODIUM CHLORIDE 0.9 % IV BOLUS
500.0000 mL | Freq: Once | INTRAVENOUS | Status: AC
  Administered 2020-12-23: 500 mL via INTRAVENOUS

## 2020-12-23 MED ORDER — SODIUM CHLORIDE 0.9 % IV SOLN
510.0000 mg | Freq: Once | INTRAVENOUS | Status: AC
  Administered 2020-12-23: 510 mg via INTRAVENOUS
  Filled 2020-12-23: qty 17

## 2020-12-23 MED ORDER — MIDODRINE HCL 5 MG PO TABS
10.0000 mg | ORAL_TABLET | Freq: Three times a day (TID) | ORAL | Status: DC
  Filled 2020-12-23: qty 2

## 2020-12-23 MED ORDER — FUROSEMIDE 10 MG/ML IJ SOLN
80.0000 mg | Freq: Once | INTRAMUSCULAR | Status: AC
  Administered 2020-12-23: 80 mg via INTRAVENOUS
  Filled 2020-12-23: qty 8

## 2020-12-23 MED ORDER — MORPHINE SULFATE (PF) 2 MG/ML IV SOLN
1.0000 mg | INTRAVENOUS | Status: DC | PRN
  Administered 2020-12-23 (×2): 1 mg via INTRAVENOUS
  Filled 2020-12-23 (×2): qty 1

## 2020-12-23 NOTE — Plan of Care (Signed)

## 2020-12-23 NOTE — Progress Notes (Signed)
[facility] Progress Note  Name: Charlene Wade MRN: [id number] DOB: 1928/10/16  Subjective:  Had 0.6 liters UOP over 2/25.   I updated her sister on the phone via her caregiver's speakerphone at bedside.  They confirmed not ever wanting anything invasive such as dialysis.  BP has been low so RN states she was just ordered a bolus - we are stopping.  It was hard to get the lokelma down this am.  She's been on bicarb gtt.   Review of systems:  Unable to obtain 2/2 AMS -------------- Background on consult:  Charlene Wade is a 87 y.o. female with a history of CKD, GERD, hypertension, A. Fib, OA, and history of CVA who presented to the hospital after an unwitnessed fall.  She was found on the floor.  Per nursing report she had fallen multiple times at home and caregiver found her this am. Labs were notable for creatinine 3.65, potassium 7.0.  Baseline creatinine is approximately 1.7-1.8.  She has been given Lokelma in the ER.  Per charting by her expressive aphasia and memory loss secondary to the CVA.  Note covid negative.  Per primary team conversation with sister, patient with weight loss and shortness of breath.  I spoke with her sister via phone.  She has a caregiver during the day and at night is alone.  She has a walker.  On Wednesday, her losartan was stopped because her blood pressure was low and her gabapentin was stopped.  Her son had a kidney transplant and was in his 20's when he died.  She saw him go through dialysis and she hated it for him and her sister states that the patient would never want that.   Intake/Output Summary (Last 24 hours) at 12/23/2020 1202 Last data filed at 12/23/2020 0859 Gross per 24 hour  Intake 1805.9 ml  Output 550 ml  Net 1255.9 ml    Vitals:  Vitals:   12/23/20 0827 12/23/20 0900 12/23/20 1000 12/23/20 1039  BP:  112/82 109/82   Pulse:  (!) 103 (!) 105   Resp:  15 19   Temp: (!) 97.4 F (36.3 C)   97.6 F (36.4 C)  TempSrc: Oral    Oral  SpO2:  95% 91%   Weight:      Height:         Physical Exam:  General: elderly female in bed with shortness of breath  HEENT: [place] left sided facial abrasions Neck: supple trachea midline Heart: S1S2 rub vs. As? Lungs: crackles and coarse breath sounds; on 4 liters oxygen Abdomen: soft/NT/ND Extremities: trace edema Skin: abrasions as above; legs bandaged Neuro: does not follow commands or answer questions GU foley in place  Medications reviewed   Labs:  BMP Latest Ref Rng & Units 12/23/2020 12/14/2020 12/21/2020  Glucose 70 - 99 mg/dL 97 127(H) 86  BUN 8 - 23 mg/dL 91(H) 99(H) 101(H)  Creatinine 0.44 - 1.00 mg/dL 3.46(H) 3.53(H) 3.48(H)  BUN/Creat Ratio 6 - 22 (calc) - - -  Sodium 135 - 145 mmol/L 137 137 135  Potassium 3.5 - 5.1 mmol/L 5.7(H) 6.2(H) 6.7(HH)  Chloride 98 - 111 mmol/L 108 108 109  CO2 22 - 32 mmol/L 17(L) 17(L) 15(L)  Calcium 8.9 - 10.3 mg/dL 8.9 8.5(L) 8.6(L)     Assessment/Plan:   # Hyperkalemia - Setting of AKI on CKD and hx of ARB (was recently stopped this week) - s/p lokelma - medical management.  Patient's sister has stated that she  would not want dialysis - see that CMP is ordered in am  # AKI - Ischemic insults and ATN.  There is only a mild elevation in CK and mod Hb in urine with 0-5 RBC and neg protein. Not overtly consistent with rhabdo per labs.  CT pelvis no overt urologic abnormality called - Continue foley  - see that CMP is ordered in am - she would never want dialysis   # Acute hypoxic resp failure  - on 4 liters oxygen  - Lasix 80 mg IV once now   # Metabolic acidosis - Setting of AKI  - Bicarb as above - amp and bicarb gtt to temporize K  # hypotension - start midodrine 10 mg TID   - can stop albumin - remain off of ARB  # AMS - stopped cymbalta and oxybutynin and recently taken off gabapentin  # Unwitnessed fall - Trauma imaging per ER and primary  # Normocytic anemia - iron deficiency - feraheme IV  once   # CKD stage 4 - note baseline Cr 1.7-1.8  See palliative was consulted and appreciate primary and palliative and nursing teams.   Claudia Desanctis, MD 12/23/2020 12:27 PM

## 2020-12-23 NOTE — Progress Notes (Addendum)
Patient ID: Charlene Wade, female   DOB: September 29, 1928, 87 y.o.   MRN: [id number]  PROGRESS NOTE    YAELIS SCHARFENBERG  ZES:[id number] DOB: 1928-05-14 DOA: 12/19/2020 PCP: Caren Macadam, MD   Brief Narrative:  87 year old female with history of chronic A. fib on Eliquis, hypertension, chronic kidney disease stage III, chronic ambulatory dysfunction, gout, osteoporosis presented with recurrent falls and left knee pain.  On presentation, she was hypoxic up to 88% on room air requiring supplemental oxygen.  She had a trauma scan done which was negative except for CT head accidental finding of suspected small right cerebellar infarct.  She was found to have creatinine of 3.6 compared to baseline of 1.4-1.8 and potassium of 7.  Nephrology was consulted.  Assessment & Plan:   Acute kidney injury on chronic kidney disease stage IIIb Hyperkalemia Acute metabolic acidosis -Possibly from dehydration and poor oral intake -found to have creatinine of 3.6 compared to baseline of 1.4-1.8 and potassium of 7.  Nephrology was consulted. -Creatinine 3.46 and potassium of 5.7 today.  Currently on bicarb drip and with IV albumin as per nephrology.  Continue Lokelma. -Monitor urine output.  Patient is not dialysis candidate.  Monitor renal function  Acute hypoxic respiratory failure Possible acute unspecified CHF -Currently still requiring 4 L oxygen via nasal cannula.  Chest x-ray on presentation showed congestive heart failure with pulmonary edema and bilateral effusions -2D echo.  Strict input output.  Daily weights.  Cannot give diuretics at this time due to renal function  Stroke of undetermined age -CT of the head showed age indeterminate right cerebellar infarcts.  Doubt that patient can undergo MRI at this time -Patient is already on Eliquis and stroke work-up will probably not be much beneficial  Acute metabolic encephalopathy -Very slow to respond.  Monitor mental status.  Fall precautions.  SLP  evaluation.  Diet as per SLP recommendations  Right posterior tibial stage II pressure ulcer and cellulitis: Present on admission -Continue Rocephin.  Wound care consult  Chronic A. Fib -Currently tachycardic.  Continue Eliquis  Hypertension -Monitor blood pressure.  Currently stable  Anemia of chronic disease -Hemoglobin currently stable.  Monitor  Generalized deconditioning Chronic ambulatory dysfunction -Overall condition has worsened recently.  Prognosis is very poor considering current medical status with renal failure, respiratory failure, CHF and poor oral intake with poor urine output.  Consider hospice/comfort measures if condition doesn't improve.  Palliative care consult for goals of care discussion    DVT prophylaxis: Eliquis Code Status: Discussed with sister and will change code status to DNR Family Communication: spoke to sister/Helen on phone on 12/23/20 Disposition Plan: Status is: Inpatient  Remains inpatient appropriate because:Inpatient level of care appropriate due to severity of illness   Dispo: The patient is from: Home              Anticipated d/c is to: Home              Patient currently is not medically stable to d/c.  anticipated discharge date unclear at this time as patient is clinically very ill.   Difficult to place patient No  Consultants: Nephrology.  Consult palliative care  Procedures: None  Antimicrobials: Rocephin from 12/11/2020 onwards   Subjective: Patient seen and examined at bedside.  Wakes up slightly, states her name but does not participate in rest of the conversation.  No overnight fever or vomiting reported per nursing staff.  Objective: Vitals:   12/23/20 0800 12/23/20 0827 12/23/20 0900 12/23/20  1000  BP: 111/77  112/82 109/82  Pulse: 98  (!) 103 (!) 105  Resp: 18  15 19   Temp:  (!) 97.4 F (36.3 C)    TempSrc:  Oral    SpO2: 97%  95% 91%  Weight:      Height:        Intake/Output Summary (Last 24 hours) at  12/23/2020 1005 Last data filed at 12/23/2020 0859 Gross per 24 hour  Intake 1805.9 ml  Output 550 ml  Net 1255.9 ml   Filed Weights   12/14/2020 1150 12/23/20 0123  Weight: 43 kg 55.3 kg    Examination:  General exam: Elderly female lying in bed.  Currently on 4 L oxygen by nasal cannula.  Poor historian.  No distress currently Respiratory system: Bilateral decreased breath sounds at bases with some scattered crackles Cardiovascular system: S1 & S2 heard, tachycardic Gastrointestinal system: Abdomen is nondistended, soft and nontender. Normal bowel sounds heard. Extremities: No cyanosis, clubbing; bilateral lower extremity edema present Central nervous system:  Wakes up slightly, states her name but does not participate in rest of the conversation.  Slightly confused.  No focal neurological deficits. Moving extremities Skin: No rashes, lesions or ulcers Psychiatry: Could not be assessed because of mental status    Data Reviewed: I have personally reviewed following labs and imaging studies  CBC: Recent Labs  Lab 12/09/2020 1300 12/23/20 0252  WBC 10.9* 8.2  NEUTROABS 9.6*  --   HGB 10.8* 9.8*  HCT 37.2 32.2*  MCV 95.9 95.0  PLT 261 098   Basic Metabolic Panel: Recent Labs  Lab 12/13/2020 1300 12/10/2020 1614 12/10/2020 2110 12/11/2020 2319 12/23/20 0252  NA 132*  --  135 137 137  K 7.0* 7.0* 6.7* 6.2* 5.7*  CL 105  --  109 108 108  CO2 17*  --  15* 17* 17*  GLUCOSE 91  --  86 127* 97  BUN 103*  --  101* 99* 91*  CREATININE 3.65*  --  3.48* 3.53* 3.46*  CALCIUM 8.6*  --  8.6* 8.5* 8.9  MG  --  2.0  --   --   --    GFR: Estimated Creatinine Clearance: 7.3 mL/min (A) (by C-G formula based on SCr of 3.46 mg/dL (H)). Liver Function Tests: Recent Labs  Lab 12/18/2020 1300  AST 74*  ALT 26  ALKPHOS 41  BILITOT 1.2  PROT 5.7*  ALBUMIN 2.8*   No results for input(s): LIPASE, AMYLASE in the last 168 hours. No results for input(s): AMMONIA in the last 168  hours. Coagulation Profile: No results for input(s): INR, PROTIME in the last 168 hours. Cardiac Enzymes: Recent Labs  Lab 12/20/2020 1545  CKTOTAL 365*   BNP (last 3 results) No results for input(s): PROBNP in the last 8760 hours. HbA1C: No results for input(s): HGBA1C in the last 72 hours. CBG: Recent Labs  Lab 12/12/2020 2207  GLUCAP 74   Lipid Profile: No results for input(s): CHOL, HDL, LDLCALC, TRIG, CHOLHDL, LDLDIRECT in the last 72 hours. Thyroid Function Tests: No results for input(s): TSH, T4TOTAL, FREET4, T3FREE, THYROIDAB in the last 72 hours. Anemia Panel: Recent Labs    11/30/2020 2319  TIBC 368  IRON 22*   Sepsis Labs: Recent Labs  Lab 12/05/2020 2109 12/15/2020 2319  LATICACIDVEN 0.6 1.3    Recent Results (from the past 240 hour(s))  Urine Culture     Status: None   Collection Time: 12/20/20  4:31 PM   Specimen: Urine  Result Value Ref Range Status   MICRO NUMBER: [id number]  Final   SPECIMEN QUALITY: Adequate  Final   Sample Source NOT GIVEN  Final   STATUS: FINAL  Final   ISOLATE 1:   Final    Mixed genital flora isolated. These superficial bacteria are not indicative of a urinary tract infection. No further organism identification is warranted on this specimen. If clinically indicated, recollect clean-catch, mid-stream urine and transfer  immediately to Urine Culture Transport Tube.   Resp Panel by RT-PCR (Flu A&B, Covid) Nasopharyngeal Swab     Status: None   Collection Time: 11/29/2020  5:29 PM   Specimen: Nasopharyngeal Swab; Nasopharyngeal(NP) swabs in vial transport medium  Result Value Ref Range Status   SARS Coronavirus 2 by RT PCR NEGATIVE NEGATIVE Final    Comment: (NOTE) SARS-CoV-2 target nucleic acids are NOT DETECTED.  The SARS-CoV-2 RNA is generally detectable in upper respiratory specimens during the acute phase of infection. The lowest concentration of SARS-CoV-2 viral copies this assay can detect is 138 copies/mL. A negative result  does not preclude SARS-Cov-2 infection and should not be used as the sole basis for treatment or other patient management decisions. A negative result may occur with  improper specimen collection/handling, submission of specimen other than nasopharyngeal swab, presence of viral mutation(s) within the areas targeted by this assay, and inadequate number of viral copies(<138 copies/mL). A negative result must be combined with clinical observations, patient history, and epidemiological information. The expected result is Negative.  Fact Sheet for Patients:  EntrepreneurPulse.com.au  Fact Sheet for Healthcare Providers:  IncredibleEmployment.be  This test is no t yet approved or cleared by the [place] FDA and  has been authorized for detection and/or diagnosis of SARS-CoV-2 by FDA under an Emergency Use Authorization (EUA). This EUA will remain  in effect (meaning this test can be used) for the duration of the COVID-19 declaration under Section 564(b)(1) of the Act, 21 U.S.C.section 360bbb-3(b)(1), unless the authorization is terminated  or revoked sooner.       Influenza A by PCR NEGATIVE NEGATIVE Final   Influenza B by PCR NEGATIVE NEGATIVE Final    Comment: (NOTE) The Xpert Xpress SARS-CoV-2/FLU/RSV plus assay is intended as an aid in the diagnosis of influenza from Nasopharyngeal swab specimens and should not be used as a sole basis for treatment. Nasal washings and aspirates are unacceptable for Xpert Xpress SARS-CoV-2/FLU/RSV testing.  Fact Sheet for Patients: EntrepreneurPulse.com.au  Fact Sheet for Healthcare Providers: IncredibleEmployment.be  This test is not yet approved or cleared by the [place] FDA and has been authorized for detection and/or diagnosis of SARS-CoV-2 by FDA under an Emergency Use Authorization (EUA). This EUA will remain in effect (meaning this test can be used) for  the duration of the COVID-19 declaration under Section 564(b)(1) of the Act, 21 U.S.C. section 360bbb-3(b)(1), unless the authorization is terminated or revoked.  Performed at [facility], [place] [address], [place], [place] [address]          Radiology Studies: DG Chest 1 View  Result Date: 12/16/2020 CLINICAL DATA:  Found on the floor.  Confusion. EXAM: CHEST  1 VIEW COMPARISON:  07/10/2020 FINDINGS: There is cardiomegaly and aortic atherosclerosis. Hiatal hernia again noted. There is interstitial and alveolar pulmonary edema with bilateral effusions, left larger than right. Findings consistent with congestive heart failure. No acute bone finding. IMPRESSION: Congestive heart failure with pulmonary edema and bilateral effusions, left larger than right. Electronically Signed   By:  Nelson Chimes M.D.   On: 12/23/2020 20:25   DG Knee 2 Views Left  Result Date: 12/23/2020 CLINICAL DATA:  Left knee pain after fall today. EXAM: LEFT KNEE - 1-2 VIEW COMPARISON:  None. FINDINGS: No evidence of fracture, dislocation, or joint effusion. Severe narrowing of medial joint space is noted. Moderate narrowing of patellofemoral space is noted. Soft tissues are unremarkable. IMPRESSION: Severe degenerative joint disease is noted medially. No acute abnormality seen in the left knee. Electronically Signed   By: Marijo Conception M.D.   On: 12/05/2020 13:57   CT Head Wo Contrast  Result Date: 12/03/2020 CLINICAL DATA:  Facial trauma. Neck trauma. Additional history provided: Patient found down by caregiver, left-sided facial swelling present. EXAM: CT HEAD WITHOUT CONTRAST CT MAXILLOFACIAL WITHOUT CONTRAST CT CERVICAL SPINE WITHOUT CONTRAST TECHNIQUE: Multidetector CT imaging of the head, cervical spine, and maxillofacial structures were performed using the standard protocol without intravenous contrast. Multiplanar CT image reconstructions of the cervical spine and maxillofacial structures were also  generated. COMPARISON:  MRI/MRA head 09/27/2017.  Head CT 09/26/2017. FINDINGS: CT HEAD FINDINGS Brain: Mild-to-moderate cerebral atrophy. Redemonstrated densely calcified meningioma overlying the lateral left frontal lobe measuring 1.9 x 1.5 cm (series 5, image 24). Unchanged local mass effect upon the underlying left frontal lobe. No midline shift. Redemonstrated chronic right MCA territory cortical/subcortical infarct within the right frontal, parietal and temporal lobes as well as posterior right insula. Background advanced ill-defined hypoattenuation within the cerebral white matter which is nonspecific, but compatible with chronic small vessel ischemic disease. There are small infarcts within the right cerebellar hemisphere which are new as compared to the brain MRI of 09/27/2017, but otherwise age indeterminate. There is no acute intracranial hemorrhage. No demarcated cortical infarct. No extra-axial fluid collection. Vascular: Atherosclerotic calcifications. Skull: Normal. Negative for fracture or focal lesion. CT MAXILLOFACIAL FINDINGS Mildly motion degraded exam. Osseous: No acute maxillofacial fracture is identified. Orbits: No acute finding within the orbits. The globes are normal in size and contour. The extraocular muscles and optic nerve sheath complexes are symmetric and unremarkable. Sinuses: Small volume fluid within the right frontal sinus and scattered within right ethmoid air cells. Air-fluid levels within the bilateral maxillary sinuses. Background mild scattered paranasal sinus mucosal thickening. Soft tissues: Left cheek soft tissue swelling/hematoma. CT CERVICAL SPINE FINDINGS Mildly motion degraded exam. Alignment: Cervical dextrocurvature. Straightening of the expected cervical lordosis. 2 mm C3-C4 grade 1 anterolisthesis. Skull base and vertebrae: The basion-dental and atlanto-dental intervals are maintained.No evidence of acute fracture to the cervical spine. Soft tissues and spinal  canal: No prevertebral fluid or swelling. No visible canal hematoma. Disc levels: Cervical spondylosis with multilevel disc space narrowing, disc bulges, uncovertebral hypertrophy and facet arthrosis. Disc space narrowing is severe at C4-C5, C5-C6, C6-C7 and C7-T1. No appreciable high-grade spinal canal stenosis. Multilevel bony neural foraminal narrowing. Facet joint ankylosis on the left at C4-C5. Upper chest: No consolidation within the imaged lung apices. No visible pneumothorax. Partially imaged left pleural effusion. IMPRESSION: CT head: 1. No acute posttraumatic intracranial findings. 2. Small infarcts within the right cerebellar hemisphere are new as compared to the prior brain MRI of 09/27/2017, but otherwise age-indeterminate. Consider a brain MRI for further evaluation. 3. 1.9 x 1.5 cm densely calcified meningioma overlying the lateral left frontal lobe with mild localized mass effect, unchanged. 4. Redemonstrated chronic cortical/subcortical right MCA territory infarct. 5. Stable background generalized cerebral atrophy and advanced chronic small vessel ischemic disease. CT maxillofacial: 1. Mildly motion degraded exam 2.  No evidence of acute maxillofacial fracture. 3. Left cheek soft tissue swelling/hematoma. 4. Paranasal sinus disease with air-fluid levels, as described. Correlate for acute sinusitis. CT cervical spine: 1. Mildly motion degraded exam. 2. No evidence of acute fracture to the cervical spine. 3. Nonspecific straightening of the expected cervical lordosis. 4. Cervical dextrocurvature. 5. 2 mm C3-C4 grade 1 anterolisthesis. 6. Advanced cervical spondylosis as described. This includes degenerative facet joint ankylosis on the left at C4-C5. 7. Partially imaged left pleural effusion. Electronically Signed   By: Kellie Simmering DO   On: 12/17/2020 15:17   CT Cervical Spine Wo Contrast  Result Date: 12/25/2020 CLINICAL DATA:  Facial trauma. Neck trauma. Additional history provided: Patient  found down by caregiver, left-sided facial swelling present. EXAM: CT HEAD WITHOUT CONTRAST CT MAXILLOFACIAL WITHOUT CONTRAST CT CERVICAL SPINE WITHOUT CONTRAST TECHNIQUE: Multidetector CT imaging of the head, cervical spine, and maxillofacial structures were performed using the standard protocol without intravenous contrast. Multiplanar CT image reconstructions of the cervical spine and maxillofacial structures were also generated. COMPARISON:  MRI/MRA head 09/27/2017.  Head CT 09/26/2017. FINDINGS: CT HEAD FINDINGS Brain: Mild-to-moderate cerebral atrophy. Redemonstrated densely calcified meningioma overlying the lateral left frontal lobe measuring 1.9 x 1.5 cm (series 5, image 24). Unchanged local mass effect upon the underlying left frontal lobe. No midline shift. Redemonstrated chronic right MCA territory cortical/subcortical infarct within the right frontal, parietal and temporal lobes as well as posterior right insula. Background advanced ill-defined hypoattenuation within the cerebral white matter which is nonspecific, but compatible with chronic small vessel ischemic disease. There are small infarcts within the right cerebellar hemisphere which are new as compared to the brain MRI of 09/27/2017, but otherwise age indeterminate. There is no acute intracranial hemorrhage. No demarcated cortical infarct. No extra-axial fluid collection. Vascular: Atherosclerotic calcifications. Skull: Normal. Negative for fracture or focal lesion. CT MAXILLOFACIAL FINDINGS Mildly motion degraded exam. Osseous: No acute maxillofacial fracture is identified. Orbits: No acute finding within the orbits. The globes are normal in size and contour. The extraocular muscles and optic nerve sheath complexes are symmetric and unremarkable. Sinuses: Small volume fluid within the right frontal sinus and scattered within right ethmoid air cells. Air-fluid levels within the bilateral maxillary sinuses. Background mild scattered paranasal  sinus mucosal thickening. Soft tissues: Left cheek soft tissue swelling/hematoma. CT CERVICAL SPINE FINDINGS Mildly motion degraded exam. Alignment: Cervical dextrocurvature. Straightening of the expected cervical lordosis. 2 mm C3-C4 grade 1 anterolisthesis. Skull base and vertebrae: The basion-dental and atlanto-dental intervals are maintained.No evidence of acute fracture to the cervical spine. Soft tissues and spinal canal: No prevertebral fluid or swelling. No visible canal hematoma. Disc levels: Cervical spondylosis with multilevel disc space narrowing, disc bulges, uncovertebral hypertrophy and facet arthrosis. Disc space narrowing is severe at C4-C5, C5-C6, C6-C7 and C7-T1. No appreciable high-grade spinal canal stenosis. Multilevel bony neural foraminal narrowing. Facet joint ankylosis on the left at C4-C5. Upper chest: No consolidation within the imaged lung apices. No visible pneumothorax. Partially imaged left pleural effusion. IMPRESSION: CT head: 1. No acute posttraumatic intracranial findings. 2. Small infarcts within the right cerebellar hemisphere are new as compared to the prior brain MRI of 09/27/2017, but otherwise age-indeterminate. Consider a brain MRI for further evaluation. 3. 1.9 x 1.5 cm densely calcified meningioma overlying the lateral left frontal lobe with mild localized mass effect, unchanged. 4. Redemonstrated chronic cortical/subcortical right MCA territory infarct. 5. Stable background generalized cerebral atrophy and advanced chronic small vessel ischemic disease. CT maxillofacial: 1. Mildly motion degraded  exam 2. No evidence of acute maxillofacial fracture. 3. Left cheek soft tissue swelling/hematoma. 4. Paranasal sinus disease with air-fluid levels, as described. Correlate for acute sinusitis. CT cervical spine: 1. Mildly motion degraded exam. 2. No evidence of acute fracture to the cervical spine. 3. Nonspecific straightening of the expected cervical lordosis. 4. Cervical  dextrocurvature. 5. 2 mm C3-C4 grade 1 anterolisthesis. 6. Advanced cervical spondylosis as described. This includes degenerative facet joint ankylosis on the left at C4-C5. 7. Partially imaged left pleural effusion. Electronically Signed   By: Kellie Simmering DO   On: 12/07/2020 15:17   CT PELVIS WO CONTRAST  Result Date: 12/05/2020 CLINICAL DATA:  Pelvic trauma Concern for left hip fracture EXAM: CT PELVIS WITHOUT CONTRAST TECHNIQUE: Multidetector CT imaging of the pelvis was performed following the standard protocol without intravenous contrast. COMPARISON:  X-ray hip 11/30/2020, x-ray sacrum 09/15/2020 FINDINGS: Urinary Tract:  No abnormality visualized. Bowel:  Sigmoid diverticulosis. Vascular/Lymphatic: No pathologically enlarged lymph nodes. Severe atherosclerotic plaque otherwise no significant vascular abnormality seen. Reproductive:  No mass or other significant abnormality Other: Trace volume simple free fluid within the pelvis. No free intraperitoneal gas. No organized fluid collection. Mesenteric fat stranding along the hepatic flexure. Suggestion of a cystic lesion in the expected region of the inferior pole of the left kidney (3:1). Musculoskeletal: Marked subcutaneus soft tissue edema of the left hip/flank only partially visualized. Diffusely decreased bone density. Heterogeneous sclerotic appearance of the sacrum may represent degenerative changes/old healed fracture versus focal sclerotic lesion (9: 94-99). No acute displaced fracture. Grade 1 anterolisthesis of L5 on S1 with severe intervertebral disc space narrowing and vacuum phenomenon, endplate sclerosis, facet arthropathy, possible left L5 pars interarticularis defect. IMPRESSION: 1. Heterogeneous sclerotic appearance of the sacrum may represent healed fracture versus focal sclerotic lesion. Consider MRI for further evaluation. 2. Mesenteric fat stranding along the hepatic flexure as well as trace volume simple free fluid within the pelvis.  Consider CT abdomen with intravenous contrast if clinically indicated. 3. No acute displaced fracture or dislocation of the hips. Marked subcutaneus soft tissue edema of the left hip/flank only partially visualized. 4. No acute displaced fracture or diastasis of the pelvis. 5. Grade 1 anterolisthesis and severe degenerative changes at the L5-S1 level. 6. Aortic Atherosclerosis (ICD10-I70.0). Electronically Signed   By: Iven Finn M.D.   On: 12/02/2020 15:37   US RENAL  Result Date: 12/09/2020 CLINICAL DATA:  Acute kidney injury. EXAM: RENAL / URINARY TRACT ULTRASOUND COMPLETE COMPARISON:  None. FINDINGS: Right Kidney: Renal measurements: 9.1 x 6.1 x 6.1 cm = volume: 176 mL. Increased echogenicity of renal parenchyma is noted suggesting medical renal disease. 5.5 cm simple cyst is seen in upper pole. No mass or hydronephrosis visualized. Left Kidney: Renal measurements: 9.4 x 4.7 x 4.5 cm = volume: 105 mL. Increased echogenicity of renal parenchyma is noted suggesting medical renal disease. 2 renal cysts are noted, the largest measuring 3.9 cm with 2 thin septations. No mass or hydronephrosis visualized. Bladder: Decompressed secondary to Foley catheter. Other: None. IMPRESSION: Increased echogenicity of renal parenchyma is noted bilaterally suggesting medical renal disease. Bilateral renal cysts are noted. No hydronephrosis or renal obstruction is noted. Electronically Signed   By: Marijo Conception M.D.   On: 12/06/2020 20:13   DG Hip Unilat With Pelvis 2-3 Views Left  Result Date: 12/11/2020 CLINICAL DATA:  Left knee pain after fall today. EXAM: DG HIP (WITH OR WITHOUT PELVIS) 2-3V LEFT COMPARISON:  None. FINDINGS: There is no evidence of  hip fracture or dislocation. There is no evidence of arthropathy or other focal bone abnormality. IMPRESSION: Negative. Electronically Signed   By: Marijo Conception M.D.   On: 12/16/2020 13:56   CT Maxillofacial WO CM  Result Date: 12/14/2020 CLINICAL DATA:  Facial  trauma. Neck trauma. Additional history provided: Patient found down by caregiver, left-sided facial swelling present. EXAM: CT HEAD WITHOUT CONTRAST CT MAXILLOFACIAL WITHOUT CONTRAST CT CERVICAL SPINE WITHOUT CONTRAST TECHNIQUE: Multidetector CT imaging of the head, cervical spine, and maxillofacial structures were performed using the standard protocol without intravenous contrast. Multiplanar CT image reconstructions of the cervical spine and maxillofacial structures were also generated. COMPARISON:  MRI/MRA head 09/27/2017.  Head CT 09/26/2017. FINDINGS: CT HEAD FINDINGS Brain: Mild-to-moderate cerebral atrophy. Redemonstrated densely calcified meningioma overlying the lateral left frontal lobe measuring 1.9 x 1.5 cm (series 5, image 24). Unchanged local mass effect upon the underlying left frontal lobe. No midline shift. Redemonstrated chronic right MCA territory cortical/subcortical infarct within the right frontal, parietal and temporal lobes as well as posterior right insula. Background advanced ill-defined hypoattenuation within the cerebral white matter which is nonspecific, but compatible with chronic small vessel ischemic disease. There are small infarcts within the right cerebellar hemisphere which are new as compared to the brain MRI of 09/27/2017, but otherwise age indeterminate. There is no acute intracranial hemorrhage. No demarcated cortical infarct. No extra-axial fluid collection. Vascular: Atherosclerotic calcifications. Skull: Normal. Negative for fracture or focal lesion. CT MAXILLOFACIAL FINDINGS Mildly motion degraded exam. Osseous: No acute maxillofacial fracture is identified. Orbits: No acute finding within the orbits. The globes are normal in size and contour. The extraocular muscles and optic nerve sheath complexes are symmetric and unremarkable. Sinuses: Small volume fluid within the right frontal sinus and scattered within right ethmoid air cells. Air-fluid levels within the bilateral  maxillary sinuses. Background mild scattered paranasal sinus mucosal thickening. Soft tissues: Left cheek soft tissue swelling/hematoma. CT CERVICAL SPINE FINDINGS Mildly motion degraded exam. Alignment: Cervical dextrocurvature. Straightening of the expected cervical lordosis. 2 mm C3-C4 grade 1 anterolisthesis. Skull base and vertebrae: The basion-dental and atlanto-dental intervals are maintained.No evidence of acute fracture to the cervical spine. Soft tissues and spinal canal: No prevertebral fluid or swelling. No visible canal hematoma. Disc levels: Cervical spondylosis with multilevel disc space narrowing, disc bulges, uncovertebral hypertrophy and facet arthrosis. Disc space narrowing is severe at C4-C5, C5-C6, C6-C7 and C7-T1. No appreciable high-grade spinal canal stenosis. Multilevel bony neural foraminal narrowing. Facet joint ankylosis on the left at C4-C5. Upper chest: No consolidation within the imaged lung apices. No visible pneumothorax. Partially imaged left pleural effusion. IMPRESSION: CT head: 1. No acute posttraumatic intracranial findings. 2. Small infarcts within the right cerebellar hemisphere are new as compared to the prior brain MRI of 09/27/2017, but otherwise age-indeterminate. Consider a brain MRI for further evaluation. 3. 1.9 x 1.5 cm densely calcified meningioma overlying the lateral left frontal lobe with mild localized mass effect, unchanged. 4. Redemonstrated chronic cortical/subcortical right MCA territory infarct. 5. Stable background generalized cerebral atrophy and advanced chronic small vessel ischemic disease. CT maxillofacial: 1. Mildly motion degraded exam 2. No evidence of acute maxillofacial fracture. 3. Left cheek soft tissue swelling/hematoma. 4. Paranasal sinus disease with air-fluid levels, as described. Correlate for acute sinusitis. CT cervical spine: 1. Mildly motion degraded exam. 2. No evidence of acute fracture to the cervical spine. 3. Nonspecific  straightening of the expected cervical lordosis. 4. Cervical dextrocurvature. 5. 2 mm C3-C4 grade 1 anterolisthesis. 6. Advanced cervical  spondylosis as described. This includes degenerative facet joint ankylosis on the left at C4-C5. 7. Partially imaged left pleural effusion. Electronically Signed   By: Kellie Simmering DO   On: 12/21/2020 15:17        Scheduled Meds: . allopurinol  50 mg Oral Daily  . apixaban  2.5 mg Oral BID  . Chlorhexidine Gluconate Cloth  6 each Topical Daily  . ferrous sulfate  325 mg Oral Q breakfast  . fluticasone  1 spray Each Nare Daily  . loratadine  10 mg Oral Daily  . pantoprazole  20 mg Oral Daily  . sodium zirconium cyclosilicate  10 g Oral Daily  . vitamin B-12  50 mcg Oral Daily   Continuous Infusions: . albumin human 25 g (12/23/20 0854)  . cefTRIAXone (ROCEPHIN)  IV Stopped (12/23/20 5397)  . sodium bicarbonate (isotonic) 150 mEq in D5W 1000 mL infusion 75 mL/hr at 12/23/20 0859          Aline August, MD [facility] 12/23/2020, 10:05 AM

## 2020-12-23 NOTE — Consult Note (Signed)
Consultation Note Date: 12/23/2020   Patient Name: Charlene Wade  DOB: [birth year]  MRN: [id number]  Age / Sex: 87 y.o., female  PCP: Caren Macadam, MD Referring Physician: Aline August, MD  Reason for Consultation: Establishing goals of care  HPI/Patient Profile: 87 y.o. female  with past medical history of afib on Eliquis, hypertension, CKD stage II, chronic ambulatory dysfunction requiring walker, gout, osteoporosis, recurrent falls, recent weight loss with poor oral intake admitted on 12/04/2020 with confusion and found down by caregiver. Hospital admission for AKI on CKD receiving bicarb infusion and albumin, hyperkalemia (7.0 on admit), acute hypoxic respiratory failure likely related to acute CHF with pulmonary edema and bilateral effusions on CXR. CT head reveals age indeterminate right cerebellar infarcts. Prognosis is poor secondary to multiorgan failure--renal failure, respiratory failure, CHF, poor oral intake, and poor urine output. Patient made DNR on 2/26 following Dr. Lindalou Hose conversation with sister. Palliative medicine consultation for goals of care.   Clinical Assessment and Goals of Care:  I have reviewed medical records, discussed with Dr. Starla Link and RN, and met with sister Bonnita Nasuti) at bedside to discuss [place]. Also spoke with caregiver Paulino Rily) this AM regarding patient's baseline. Ms. Galentine will intermittently wake to voice but she is lethargic and confused. She does not appear to be in pain or discomfort. She does ask for water. RN at bedside using swabs. Patient is high aspiration risk with current mentation and remains NPO following SLP evaluation.    I introduced Palliative Medicine as specialized medical care for people living with serious illness. It focuses on providing relief from the symptoms and stress of a serious illness.  We discussed a brief life review of the patient.  Widowed. Lived in [place] for 50+ years and moved back to [place] to be closer to family when her husband passed. One son (deceased) and daughter who lives near [place]. Prior to admission, patient living home alone with frequent caregiver support and family support. Cher cares for Ms. Huttner 6 days a week. Patient's sisters check in regularly when Paulino Rily is off duty.    Sister and caregiver report patient decline in approximately the last year with increased falls, ambulatory dysfunction requiring walker, poor oral intake, and weight loss. Possibly some baseline cognitive impairment/dementia.   Discussed events leading up to admission and course of hospitalization including diagnoses, interventions, plan of care. Bonnita Nasuti has spoken with Dr. Starla Link and Dr. Royce Macadamia this afternoon. She understands the tenuous nature of her sister's condition. We discussed multiorgan failure and challenges with managing fluid overload with failing kidneys and ability to give aggressive hydration for low blood pressure due to fluid overload.   I attempted to elicit values and goals of care important to the patient and family . Advanced directives and concepts specific to code status were discussed. Bonnita Nasuti reports she and patient's daughter are joint POA's. Bonnita Nasuti is in regular contact with patient's daughter and confirms plan for DNR/DNI code status. She also agrees with no escalation of care including NO ICU transfer, NO  vasopressors, and/or NO dialysis. Bonnita Nasuti shares that patient's son was a dialysis and kidney transplant patient. Verenise saw what her son went through and Bonnita Nasuti does not feel she would ever wish to go through with dialysis. She shares that Bonne has been at peace and "ready to go" for some time now. She turned 89 last this week.   At this point, Bonnita Nasuti would like to continue current plan of care and medical management and give at least 24-48 more hours for improvement. I explained concern with possible GI bleeding now  as nurse just reported a maroon stool. Bonnita Nasuti is ok with blood transfusion if necessary.   Discussed watchful waiting and time for outcomes. Discussed shift to comfort measures if she does not show improvement or condition worsens through the weekend. Bonnita Nasuti does not wish for her to be in pain or suffer, and would be open to shift to comfort if her sister becomes uncomfortable.   Reassured of ongoing support. [facility] Cho[facility] and PMT contact information given. Answered questions.     SUMMARY OF RECOMMENDATIONS    Sister reports she Candis Musa) and patient's daughter Corinda Gubler) are documented POA's. Bonnita Nasuti is local and primary contact. Bonnita Nasuti has spoken and confirmed goals with Horris Latino.  DNR/DNI  NO transfer to ICU if decline. NO vasopressors or dialysis.   Continue current plan of care and medical management for 24-48 hours. Watchful waiting. Time for outcomes.  Sister ok with blood transfusion if necessary.   Sister open to further comfort/hospice discussions if lack of improvement and/or decline through the weekend.   Code Status/Advance Care Planning:  DNR  Symptom Management:   Per attending  Palliative Prophylaxis:   Aspiration, Delirium Protocol, Frequent Pain Assessment, Oral Care and Turn Reposition  Psycho-social/Spiritual:   Desire for further Chaplaincy support: yes  Additional Recommendations: Caregiving  Support/Resources, Compassionate Wean Education and Education on Hospice  Prognosis:   Tenuous with multiorgan failure  Discharge Planning: To Be Determined      Primary Diagnoses: Present on Admission: . AKI (acute kidney injury) ([place])   I have reviewed the medical record, interviewed the patient and family, and examined the patient. The following aspects are pertinent.  Past Medical History:  Diagnosis Date  . Allergic rhinitis   . Atrial fibrillation ([place]) 09/26/2017   New-onset A. Fib now persistent  . BCC (basal cell carcinoma of skin)  04/10/2015   sees dermatologist  . CKD (chronic kidney disease) stage 3, GFR 30-59 ml/min ([place]) 04/26/2015  . Depression 10/17/2012  . Ectopic pregnancy   . GERD (gastroesophageal reflux disease)    hx hiatal hernia, has seen GI, ENT and allergist in the past  . Hyperlipidemia   . Hypertension   . OA (osteoarthritis)    knees and back, hx DDD, s/p remote lumbar disc surgery  . OAB (overactive bladder) 04/10/2015  . Obesity   . Osteoporosis    on prolia in the past  . Stroke [facility])    sees neurologist, hx memory loss, expressive aphasia  . Venous insufficiency    Social History   Socioeconomic History  . Marital status: Widowed    Spouse name: Not on file  . Number of children: 2  . Years of education: 56  . Highest education level: Not on file  Occupational History  . Occupation: retired  Tobacco Use  . Smoking status: Former Research scientist (life sciences)  . Smokeless tobacco: Never Used  Vaping Use  . Vaping Use: Never used  Substance and Sexual Activity  .  Alcohol use: No  . Drug use: No  . Sexual activity: Never  Other Topics Concern  . Not on file  Social History Narrative   Patient is single with 2 children.   Patient is right handed.   Patient has 12 th grade education.   Patient does not drink caffeine.   Social Determinants of Health   Financial Resource Strain: Not on file  Food Insecurity: Not on file  Transportation Needs: Not on file  Physical Activity: Not on file  Stress: Not on file  Social Connections: Not on file   Family History  Problem Relation Age of Onset  . Hypertension Father   . Hypertension Mother   . Cancer - Lung Brother    Scheduled Meds: . allopurinol  50 mg Oral Daily  . Chlorhexidine Gluconate Cloth  6 each Topical Daily  . ferrous sulfate  325 mg Oral Q breakfast  . fluticasone  1 spray Each Nare Daily  . loratadine  10 mg Oral Daily  . midodrine  10 mg Oral TID WC  . pantoprazole  20 mg Oral Daily  . vitamin B-12  50 mcg Oral Daily    Continuous Infusions: . cefTRIAXone (ROCEPHIN)  IV Stopped (12/23/20 0722)  . ferumoxytol 510 mg (12/23/20 1550)   PRN Meds:.acetaminophen, albuterol, metoprolol tartrate Medications Prior to Admission:  Prior to Admission medications   Medication Sig Start Date End Date Taking? Authorizing Provider  acetaminophen (TYLENOL) 500 MG tablet Take 1 tablet (500 mg total) by mouth every 6 (six) hours as needed. Patient taking differently: Take 1,000 mg by mouth 2 (two) times daily. 09/15/20  Yes Nanavati, Ankit, MD  albuterol (VENTOLIN HFA) 108 (90 Base) MCG/ACT inhaler Inhale 2 puffs into the lungs every 6 (six) hours as needed. Patient taking differently: Inhale 2 puffs into the lungs every 6 (six) hours as needed for wheezing or shortness of breath. 08/02/20  Yes Koberlein, Junell C, MD  allopurinol (ZYLOPRIM) 100 MG tablet TAKE 1 TABLET(100 MG) BY MOUTH DAILY Patient taking differently: Take 100 mg by mouth daily. 12/13/20  Yes Koberlein, Steele Berg, MD  carvedilol (COREG) 6.25 MG tablet Take 1 tablet (6.25 mg total) by mouth 2 (two) times daily with a meal. 01/18/20  Yes Lorretta Harp, MD  cetirizine (ZYRTEC) 10 MG tablet Take 10 mg by mouth at bedtime.    Yes [provider]  cholecalciferol (VITAMIN D) 1000 units tablet Take 2,000 Units by mouth 2 (two) times daily.   Yes [provider]  Cyanocobalamin (VITAMIN B12 PO) Take 1 tablet by mouth daily.    Yes [provider]  denosumab (PROLIA) 60 MG/ML SOLN injection Inject 60 mg into the skin every 6 (six) months. Administer in upper arm, thigh, or abdomen   Yes [provider]  diltiazem (CARDIZEM CD) 240 MG 24 hr capsule TAKE 1 CAPSULE(240 MG) BY MOUTH DAILY Patient taking differently: Take 240 mg by mouth daily. 12/07/20  Yes Koberlein, Junell C, MD  DULoxetine (CYMBALTA) 20 MG capsule TAKE 1 CAPSULE(20 MG) BY MOUTH DAILY Patient taking differently: Take 20 mg by mouth daily. 11/01/20  Yes Koberlein,  Junell C, MD  ELIQUIS 5 MG TABS tablet TAKE 1 TABLET(5 MG) BY MOUTH TWICE DAILY Patient taking differently: Take 5 mg by mouth 2 (two) times daily. 08/29/20  Yes Koberlein, Junell C, MD  fenofibrate (TRICOR) 145 MG tablet TAKE 1 TABLET(145 MG) BY MOUTH DAILY Patient taking differently: Take 145 mg by mouth daily. 07/31/20  Yes  Koberlein, Junell C, MD  ferrous sulfate 325 (65 FE) MG EC tablet TAKE 1 TABLET(325 MG) BY MOUTH TWICE DAILY BEFORE A MEAL Patient taking differently: Take 325 mg by mouth 2 (two) times daily. 12/11/20  Yes Shadad, Mathis Dad, MD  fluticasone (FLONASE) 50 MCG/ACT nasal spray SHAKE LIQUID AND USE 1 SPRAY IN EACH NOSTRIL TWICE DAILY Patient taking differently: Place 1 spray into both nostrils 2 (two) times daily as needed for rhinitis. 10/30/20  Yes Koberlein, Junell C, MD  furosemide (LASIX) 20 MG tablet TAKE 1 TABLET(20 MG) BY MOUTH DAILY AS NEEDED FOR SWELLING IN LEGS Patient taking differently: Take 20 mg by mouth daily. 04/24/20  Yes Koberlein, Junell C, MD  gabapentin (NEURONTIN) 100 MG capsule Take 100 mg by mouth 2 (two) times daily.   Yes [provider]  Multiple Vitamins-Minerals (PRESERVISION AREDS PO) Take 1-2 tablets by mouth See admin instructions. Takes 2 tablets by mouth in the morning; takes 1 tablet at bedtime   Yes [provider]  oxybutynin (DITROPAN-XL) 5 MG 24 hr tablet TAKE 1 TABLET BY MOUTH EVERY DAY Patient taking differently: Take 5 mg by mouth at bedtime. 05/26/20  Yes Koberlein, Junell C, MD  pantoprazole (PROTONIX) 20 MG tablet TAKE 1 TABLET(20 MG) BY MOUTH DAILY Patient taking differently: Take 20 mg by mouth daily. 10/30/20  Yes Koberlein, Steele Berg, MD  Polyethyl Glycol-Propyl Glycol (SYSTANE OP) Place 1 drop into both eyes 2 (two) times daily as needed (dry eyes).   Yes [provider]  Spacer/Aero-Holding Chambers (AEROCHAMBER PLUS) inhaler Use as instructed 08/02/20   Caren Macadam, MD   Allergies  Allergen Reactions   . Levaquin [Levofloxacin] Other (See Comments)    Severe intermittent arm pain  . Ace Inhibitors Cough  . Codeine Other (See Comments)    Unknown reaction - didn't feel well  . Other Other (See Comments)    Seafood made her feel so sick recently   . Penicillins Other (See Comments)    Caused high fever Has patient had a PCN reaction causing immediate rash, facial/tongue/throat swelling, SOB or lightheadedness with hypotension: No Has patient had a PCN reaction causing severe rash involving mucus membranes or skin necrosis: No Has patient had a PCN reaction that required hospitalization: No Has patient had a PCN reaction occurring within the last 10 years: No If all of the above answers are "NO", then may proceed with Cephalosporin use.  . Sulfa Antibiotics Other (See Comments)    Possibly caused fever  . Sulfasalazine Other (See Comments)    Possibly caused fever   Review of Systems  Unable to perform ROS: Acuity of condition    Physical Exam Vitals and nursing note reviewed.  Constitutional:      Appearance: She is ill-appearing.  HENT:     Head: Normocephalic and atraumatic.  Cardiovascular:     Rate and Rhythm: Normal rate.  Pulmonary:     Effort: No tachypnea, accessory muscle usage or respiratory distress.     Breath sounds: Rhonchi present.     Comments: 4L [place] Skin:    General: Skin is warm and dry.     Coloration: Skin is pale.  Neurological:     Mental Status: She is lethargic.  Psychiatric:        Attention and Perception: She is inattentive.        Speech: She is noncommunicative.        Cognition and Memory: Cognition is impaired.    Vital Signs:  BP 109/82 (BP Location: Left Arm)   Pulse (!) 105   Temp 98.1 F (36.7 C) (Oral)   Resp 19   Ht '4\' 8"'  (1.422 m)   Wt 55.3 kg   SpO2 91%   BMI 27.33 kg/m  Pain Scale: 0-10   Pain Score: 0-No pain   SpO2: SpO2: 91 % O2 Device:SpO2: 91 % O2 Flow Rate: .O2 Flow Rate (L/min): 4 L/min  IO: Intake/output  summary:   Intake/Output Summary (Last 24 hours) at 12/23/2020 1601 Last data filed at 12/23/2020 0859 Gross per 24 hour  Intake 1805.9 ml  Output 550 ml  Net 1255.9 ml    LBM: Last BM Date: 12/23/20 Baseline Weight: Weight: 43 kg Most recent weight: Weight: 55.3 kg     Palliative Assessment/Data: PPS 20%   Flowsheet Rows   Flowsheet Row Most Recent Value  Intake Tab   Referral Department Hospitalist  Unit at Time of Referral Cardiac/Telemetry Unit  Palliative Care Primary Diagnosis Other (Comment)  Palliative Care Type New Palliative care  Reason for referral Clarify Goals of Care  Date first seen by Palliative Care 12/23/20  Clinical Assessment   Palliative Performance Scale Score 20%  Psychosocial & Spiritual Assessment   Palliative Care Outcomes   Patient/Family meeting held? Yes  Who was at the meeting? sister/POA  Palliative Care Outcomes Clarified goals of care, Provided end of life care assistance, Provided psychosocial or spiritual support, ACP counseling assistance      Time Total:  75 Greater than 50%  of this time was spent counseling and coordinating care related to the above assessment and plan.  Signed by:  Ihor Dow, DNP, FNP-C Palliative Medicine Team  Phone: [phone] Fax: [phone]   Please contact Palliative Medicine Team phone at [phone] for questions and concerns.  For individual provider: See Shea Evans

## 2020-12-23 NOTE — Progress Notes (Signed)
Initial Nutrition Assessment  DOCUMENTATION CODES:   Not applicable  INTERVENTION:  RD to order nutritional supplements as appropriate upon PO readiness/diet advancement.   NUTRITION DIAGNOSIS:   Inadequate oral intake related to inability to eat as evidenced by NPO status.  GOAL:   Patient will meet greater than or equal to 90% of their needs  MONITOR:   Skin,Weight trends,Labs,I & O's,Diet advancement  REASON FOR ASSESSMENT:   Consult Assessment of nutrition requirement/status  ASSESSMENT:   87 year old female with history of chronic A. fib on Eliquis, hypertension, chronic kidney disease stage III, chronic ambulatory dysfunction, gout, osteoporosis presented with recurrent falls and left knee pain. Pt with acute CHF, CT head finding of suspected small right cerebellar infarct.  Pt confused and lethargic. RD unable to obtain pt nutrition history at this time, however per MD note, pt with decreased appetite over the past 4-5 months. Pt is currently NPO due to decreased cognitive/mental status. SLP continues to follow for readiness to have POs. Per MD, pt with poor prognosis. If pt with no improvement, may need to consider hospice/comfort measure. Palliative has been consulted for goals of care.   Unable to complete Nutrition-Focused physical exam at this time.   Labs and medications reviewed.   Diet Order:   Diet Order            Diet NPO time specified Except for: Sips with Meds  Diet effective now                 EDUCATION NEEDS:   Not appropriate for education at this time  Skin:  Skin Assessment: Skin Integrity Issues: Skin Integrity Issues:: Stage II Stage II: R tibia  Last BM:  Unknown  Height:   Ht Readings from Last 1 Encounters:  12/21/2020 4\' 8"  (1.422 m)    Weight:   Wt Readings from Last 1 Encounters:  12/23/20 55.3 kg   BMI:  Body mass index is 27.33 kg/m.  Estimated Nutritional Needs:   Kcal:  1450-1600  Protein:  65-75  grams  Fluid:  >/= 1.5 L/day  Corrin Parker, MS, RD, LDN RD pager number/after hours weekend pager number on Amion.

## 2020-12-23 NOTE — Progress Notes (Signed)
PT Cancellation Note  Patient Details Name: Charlene Wade MRN: [id number] DOB: 11/02/1927   Cancelled Treatment:    Reason Eval/Treat Not Completed: Medical issues which prohibited therapy RN requesting hold due to soft BP's.  Wyona Almas, PT, DPT Acute Rehabilitation Services Pager [phone] Office [phone]    Deno Etienne 12/23/2020, 11:13 AM

## 2020-12-23 NOTE — Evaluation (Signed)
Clinical/Bedside Swallow Evaluation Patient Details  Name: Charlene Wade MRN: [id number] Date of Birth: 05-08-1928  Today's Date: 12/23/2020 Time: SLP Start Time (ACUTE ONLY): 0850 SLP Stop Time (ACUTE ONLY): 0910 SLP Time Calculation (min) (ACUTE ONLY): 20 min  Past Medical History:  Past Medical History:  Diagnosis Date  . Allergic rhinitis   . Atrial fibrillation ([place]) 09/26/2017   New-onset A. Fib now persistent  . BCC (basal cell carcinoma of skin) 04/10/2015   sees dermatologist  . CKD (chronic kidney disease) stage 3, GFR 30-59 ml/min ([place]) 04/26/2015  . Depression 10/17/2012  . Ectopic pregnancy   . GERD (gastroesophageal reflux disease)    hx hiatal hernia, has seen GI, ENT and allergist in the past  . Hyperlipidemia   . Hypertension   . OA (osteoarthritis)    knees and back, hx DDD, s/p remote lumbar disc surgery  . OAB (overactive bladder) 04/10/2015  . Obesity   . Osteoporosis    on prolia in the past  . Stroke [facility])    sees neurologist, hx memory loss, expressive aphasia  . Venous insufficiency    Past Surgical History:  Past Surgical History:  Procedure Laterality Date  . ABDOMINAL HYSTERECTOMY    . APPENDECTOMY    . BREAST REDUCTION SURGERY    . LAPAROSCOPIC SALPINGOOPHERECTOMY    . NASAL MASS EXCISION     Basal Cell Cancer Excision  . NASAL RECONSTRUCTION    . TEE WITHOUT CARDIOVERSION  10/22/2012   Procedure: TRANSESOPHAGEAL ECHOCARDIOGRAM (TEE);  Surgeon: Josue Hector, MD;  Location: [facility] ENDOSCOPY;  Service: Cardiovascular;  Laterality: N/A;   HPI:  Patient is a 87 y.o. female with PMH: chronic afib on Eliquis, HTN, CKD stage III, chronic ambulation dysfunction on RW, gout, osteoporosis who arrived to ED with recurrent falls and left knee pain. Per chart review, patient lives with 24 hour caregiver. 4-5 month h/o decreased appetite and 70lbs weight loss, cough and SOB and worsening of already unsteady gait.  Patient's first fall occured 2 days prior to  admission, no LOC and then on day of admission, caregiver found patient on floor in AM. CXR consistent with CHF, CT head revealed small infarcts within the right cerebellar hemisphere and chronic right MCA territory cortical/subcortical infarcts within right frontal, parietal and temporal lobes.   Assessment / Plan / Recommendation Clinical Impression  Patient presents with a moderate oropharyngeal dysphagia with impact from her current cognitive state of confusion and some lethargy. He was alert and responding to questions, however had difficulty keeping eyes open during evaluation. Initially, patient did not exhibit any oral response to puree solids and ice chips, holding in anterior portion of mouth and keeping open mouth posture. After SLP cued her to "chew the ice" she did so and did initiate a swallow response. With puree solids, patient able to transit orally and initiate swallow response, with trace residuals on tongue after initial swallow. Ice chip or small sip of water helped to clear oral cavity. (RN  had reported that this morning she had to remove some PO medications from patient's mouth that may have been present since previous evening). Patient exhibited delayed cough response with 1/4 sips of water but no observed change in vitals, voice remained clear. SLP recommending only necessary meds crushed in puree with small ice chips/small cup sips water after to help clear, followed by oral care to ensure no residuals in mouth. SLP will f/u with patient for readiness to have PO's. SLP Visit Diagnosis: Dysphagia,  unspecified (R13.10)    Aspiration Risk  Moderate aspiration risk;Risk for inadequate nutrition/hydration    Diet Recommendation NPO except meds;Other (Comment) (ice chips, small water sips after necessary meds crushed in puree)   Liquid Administration via: Cup Medication Administration: Crushed with puree Supervision: Staff to assist with self feeding;Full supervision/cueing for  compensatory strategies Postural Changes: Seated upright at 90 degrees    Other  Recommendations Oral Care Recommendations: Oral care BID   Follow up Recommendations Other (comment) (Pending progress snd POC)      Frequency and Duration min 2x/week  1 week       Prognosis Prognosis for Safe Diet Advancement: Good      Swallow Study   General Date of Onset: 12/23/20 HPI: Patient is a 87 y.o. female with PMH: chronic afib on Eliquis, HTN, CKD stage III, chronic ambulation dysfunction on RW, gout, osteoporosis who arrived to ED with recurrent falls and left knee pain. Per chart review, patient lives with 24 hour caregiver. 4-5 month h/o decreased appetite and 70lbs weight loss, cough and SOB and worsening of already unsteady gait.  Patient's first fall occured 2 days prior to admission, no LOC and then on day of admission, caregiver found patient on floor in AM. CXR consistent with CHF, CT head revealed small infarcts within the right cerebellar hemisphere and chronic right MCA territory cortical/subcortical infarcts within right frontal, parietal and temporal lobes. Type of Study: Bedside Swallow Evaluation Previous Swallow Assessment: None found Diet Prior to this Study: NPO Respiratory Status: Nasal cannula History of Recent Intubation: No Behavior/Cognition: Alert;Cooperative;Confused;Pleasant mood Oral Cavity Assessment: Within Functional Limits Oral Care Completed by SLP: Yes Oral Cavity - Dentition: Adequate natural dentition Self-Feeding Abilities: Total assist Patient Positioning: Upright in bed Baseline Vocal Quality: Normal Volitional Cough: Weak Volitional Swallow: Unable to elicit    Oral/Motor/Sensory Function Overall Oral Motor/Sensory Function: Generalized oral weakness Facial ROM: Within Functional Limits Facial Symmetry: Within Functional Limits Facial Strength: Within Functional Limits Lingual ROM: Within Functional Limits Lingual Symmetry: Within Functional  Limits Lingual Strength: Reduced   Ice Chips Ice chips: Impaired Oral Phase Impairments: Poor awareness of bolus;Reduced labial seal Oral Phase Functional Implications: Prolonged oral transit   Thin Liquid Thin Liquid: Impaired Presentation: Cup Oral Phase Impairments: Reduced labial seal Pharyngeal  Phase Impairments: Throat Clearing - Delayed;Cough - Delayed    Nectar Thick     Honey Thick     Puree Puree: Impaired Oral Phase Impairments: Reduced labial seal;Poor awareness of bolus Oral Phase Functional Implications: Prolonged oral transit;Oral residue   Solid     Solid: Not tested     Sonia Baller, MA, CCC-SLP Speech Therapy [facility]

## 2020-12-24 ENCOUNTER — Inpatient Hospital Stay (HOSPITAL_COMMUNITY): Payer: Medicare Other

## 2020-12-24 DIAGNOSIS — I5041 Acute combined systolic (congestive) and diastolic (congestive) heart failure: Secondary | ICD-10-CM | POA: Diagnosis not present

## 2020-12-24 DIAGNOSIS — E875 Hyperkalemia: Secondary | ICD-10-CM | POA: Diagnosis not present

## 2020-12-24 DIAGNOSIS — N179 Acute kidney failure, unspecified: Secondary | ICD-10-CM | POA: Diagnosis not present

## 2020-12-24 DIAGNOSIS — Z515 Encounter for palliative care: Secondary | ICD-10-CM | POA: Diagnosis not present

## 2020-12-24 DIAGNOSIS — Z7189 Other specified counseling: Secondary | ICD-10-CM | POA: Diagnosis not present

## 2020-12-24 DIAGNOSIS — R0902 Hypoxemia: Secondary | ICD-10-CM | POA: Diagnosis not present

## 2020-12-24 LAB — CBC WITH DIFFERENTIAL/PLATELET
Abs Immature Granulocytes: 0 10*3/uL (ref 0.00–0.07)
Basophils Absolute: 0 10*3/uL (ref 0.0–0.1)
Basophils Relative: 0 %
Eosinophils Absolute: 0 10*3/uL (ref 0.0–0.5)
Eosinophils Relative: 0 %
HCT: 36.3 % (ref 36.0–46.0)
Hemoglobin: 10.6 g/dL — ABNORMAL LOW (ref 12.0–15.0)
Lymphocytes Relative: 3 %
Lymphs Abs: 0.3 10*3/uL — ABNORMAL LOW (ref 0.7–4.0)
MCH: 28.3 pg (ref 26.0–34.0)
MCHC: 29.2 g/dL — ABNORMAL LOW (ref 30.0–36.0)
MCV: 96.8 fL (ref 80.0–100.0)
Monocytes Absolute: 0.4 10*3/uL (ref 0.1–1.0)
Monocytes Relative: 4 %
Neutro Abs: 8.2 10*3/uL — ABNORMAL HIGH (ref 1.7–7.7)
Neutrophils Relative %: 93 %
Platelets: 216 10*3/uL (ref 150–400)
RBC: 3.75 MIL/uL — ABNORMAL LOW (ref 3.87–5.11)
RDW: 19.8 % — ABNORMAL HIGH (ref 11.5–15.5)
WBC: 8.8 10*3/uL (ref 4.0–10.5)
nRBC: 3.1 % — ABNORMAL HIGH (ref 0.0–0.2)
nRBC: 5 /100 WBC — ABNORMAL HIGH

## 2020-12-24 LAB — COMPREHENSIVE METABOLIC PANEL
ALT: 23 U/L (ref 0–44)
AST: 60 U/L — ABNORMAL HIGH (ref 15–41)
Albumin: 3.4 g/dL — ABNORMAL LOW (ref 3.5–5.0)
Alkaline Phosphatase: 32 U/L — ABNORMAL LOW (ref 38–126)
Anion gap: 12 (ref 5–15)
BUN: 99 mg/dL — ABNORMAL HIGH (ref 8–23)
CO2: 22 mmol/L (ref 22–32)
Calcium: 8.4 mg/dL — ABNORMAL LOW (ref 8.9–10.3)
Chloride: 107 mmol/L (ref 98–111)
Creatinine, Ser: 3.79 mg/dL — ABNORMAL HIGH (ref 0.44–1.00)
GFR, Estimated: 11 mL/min — ABNORMAL LOW (ref >60)
Glucose, Bld: 112 mg/dL — ABNORMAL HIGH (ref 70–99)
Potassium: 5.6 mmol/L — ABNORMAL HIGH (ref 3.5–5.1)
Sodium: 141 mmol/L (ref 135–145)
Total Bilirubin: 1.4 mg/dL — ABNORMAL HIGH (ref 0.3–1.2)
Total Protein: 5.7 g/dL — ABNORMAL LOW (ref 6.5–8.1)

## 2020-12-24 LAB — MAGNESIUM: Magnesium: 2 mg/dL (ref 1.7–2.4)

## 2020-12-24 MED ORDER — LORAZEPAM 1 MG PO TABS: 1.0000 mg | ORAL_TABLET | ORAL | Status: DC | PRN

## 2020-12-24 MED ORDER — GLYCOPYRROLATE 1 MG PO TABS
1.0000 mg | ORAL_TABLET | ORAL | Status: DC | PRN
  Filled 2020-12-24: qty 1

## 2020-12-24 MED ORDER — ONDANSETRON 4 MG PO TBDP
4.0000 mg | ORAL_TABLET | Freq: Four times a day (QID) | ORAL | Status: DC | PRN
  Filled 2020-12-24: qty 1

## 2020-12-24 MED ORDER — HALOPERIDOL LACTATE 2 MG/ML PO CONC
0.5000 mg | ORAL | Status: DC | PRN
  Filled 2020-12-24: qty 0.3

## 2020-12-24 MED ORDER — LORAZEPAM 2 MG/ML PO CONC: 1.0000 mg | ORAL | Status: DC | PRN

## 2020-12-24 MED ORDER — GLYCOPYRROLATE 0.2 MG/ML IJ SOLN
0.2000 mg | INTRAMUSCULAR | Status: DC | PRN
  Administered 2020-12-24: 0.2 mg via INTRAVENOUS
  Filled 2020-12-24: qty 1

## 2020-12-24 MED ORDER — ACETAMINOPHEN 650 MG RE SUPP: 650.0000 mg | Freq: Four times a day (QID) | RECTAL | Status: DC | PRN

## 2020-12-24 MED ORDER — POLYVINYL ALCOHOL 1.4 % OP SOLN
1.0000 [drp] | Freq: Four times a day (QID) | OPHTHALMIC | Status: DC | PRN
  Filled 2020-12-24: qty 15

## 2020-12-24 MED ORDER — ONDANSETRON HCL 4 MG/2ML IJ SOLN: 4.0000 mg | Freq: Four times a day (QID) | INTRAMUSCULAR | Status: DC | PRN

## 2020-12-24 MED ORDER — LORAZEPAM 2 MG/ML IJ SOLN
1.0000 mg | INTRAMUSCULAR | Status: DC | PRN
  Administered 2020-12-24: 1 mg via INTRAVENOUS
  Filled 2020-12-24: qty 1

## 2020-12-24 MED ORDER — GLYCOPYRROLATE 0.2 MG/ML IJ SOLN: 0.2000 mg | INTRAMUSCULAR | Status: DC | PRN

## 2020-12-24 MED ORDER — MORPHINE SULFATE (PF) 2 MG/ML IV SOLN
2.0000 mg | INTRAVENOUS | Status: DC | PRN
  Administered 2020-12-24: 2 mg via INTRAVENOUS
  Filled 2020-12-24: qty 1

## 2020-12-24 MED ORDER — HALOPERIDOL LACTATE 5 MG/ML IJ SOLN
0.5000 mg | INTRAMUSCULAR | Status: DC | PRN
Start: 2020-12-24 — End: 2020-12-25

## 2020-12-24 MED ORDER — HYDROMORPHONE HCL 1 MG/ML IJ SOLN
0.5000 mg | Freq: Four times a day (QID) | INTRAMUSCULAR | Status: DC
Start: 2020-12-24 — End: 2020-12-25
  Administered 2020-12-24: 0.5 mg via INTRAVENOUS
  Filled 2020-12-24: qty 0.5

## 2020-12-24 MED ORDER — HYDROMORPHONE HCL 1 MG/ML IJ SOLN: 0.5000 mg | INTRAMUSCULAR | Status: DC | PRN

## 2020-12-24 MED ORDER — BIOTENE DRY MOUTH MT LIQD: 15.0000 mL | OROMUCOSAL | Status: DC | PRN

## 2020-12-24 MED ORDER — GLYCOPYRROLATE 0.2 MG/ML IJ SOLN: 0.2000 mg | Freq: Three times a day (TID) | INTRAMUSCULAR | Status: DC

## 2020-12-24 MED ORDER — HALOPERIDOL 0.5 MG PO TABS
0.5000 mg | ORAL_TABLET | ORAL | Status: DC | PRN
  Filled 2020-12-24: qty 1

## 2020-12-25 LAB — PATHOLOGIST SMEAR REVIEW

## 2020-12-26 NOTE — Consult Note (Signed)
Responded to request from fellow chaplain that sister of pt Charlene Wade), parishioner of [employer], had told chaplain that her sister was transitioning to comfort care and would likely appreciate communion even it it could only be touched to her lips. Consulted w/ staff, who said that would not be possible, but 2 family members were there now, and perhaps they'd appreciate an anointing of pt?--who was not then esponsive.  Visited with pt's daughter Charlene Wade and her son Charlene Wade bedside. Charlene Wade said, although Charlene Wade goes to [employer] and is Sherryll Burger, she is herself [place] and ismarried to [employer] man. She did not want to raise the kids Catholic, so she raised them [employer]. Charlene Wade did not feel a need to pray any kind of farewell prayer for pt then, but said it might be  appropriate tomorrow. She did not mention anointing. Charlene Wade came from [place] to be with pt. She and Charlene Wade, who had been there since approx. 11am, were leaving today approx. 5 pm. Pt's longtime caregiver, who is like a member of the family, will be here tonight for as long as she wishes to stay.   Charlene Wade discussed pt's life, said she had an amazing marriage with her father a judge, who shared doing the dishes every night and dancing in the kitchen--and that their relationship had been such an inspiration for her in her own marriage. Charlene Wade said pt was ready to go, that pt was in pain when they came in. Charlene Wade was more settled  now that pt was resting peacefully than when they had come in and pt seemed agitated. Both Charlene Wade and Charlene Wade said the Covid years had been isolating and bad for pt, as Charlene Wade and his family had visited her once/mo., but then they could not, and pt "just sat around." Before leaving, I let them know that they could ask a nurse to call a chaplain at any time. They were appreciative.  Rev. Eloise Levels Chaplain

## 2020-12-26 NOTE — Plan of Care (Signed)
  Problem: Pain Managment: Goal: General experience of comfort will improve Outcome: Progressing Note: Patient transitioned to comfort care

## 2020-12-26 NOTE — Progress Notes (Signed)
Patient ID: [name], female   DOB: 1928-07-02, 87 y.o.   MRN: [id number]  PROGRESS NOTE    JESSALYNN MCCOWAN  VOJ:[id number] DOB: August 29, 1928 DOA: 12/16/2020 PCP: Caren Macadam, MD   Brief Narrative:  87 year old female with history of chronic A. fib on Eliquis, hypertension, chronic kidney disease stage III, chronic ambulatory dysfunction, gout, osteoporosis presented with recurrent falls and left knee pain.  On presentation, she was hypoxic up to 88% on room air requiring supplemental oxygen.  She had a trauma scan done which was negative except for CT head accidental finding of suspected small right cerebellar infarct.  She was found to have creatinine of 3.6 compared to baseline of 1.4-1.8 and potassium of 7.  Nephrology was consulted.  Assessment & Plan:   Acute kidney injury on chronic kidney disease stage IIIb Hyperkalemia Acute metabolic acidosis -Possibly from dehydration and poor oral intake -found to have creatinine of 3.6 compared to baseline of 1.4-1.8 and potassium of 7.   -Creatinine 3.79 and potassium of 5.6 today. Off bicarb drip. Nephrology following. Continue Lokelma. -Urine output is poor.  Patient is not dialysis candidate.  Monitor renal function  Acute hypoxic respiratory failure Possible acute unspecified CHF -Currently on 4 L oxygen via nasal cannula.  Chest x-ray on presentation showed congestive heart failure with pulmonary edema and bilateral effusions -2D echo pending.  Strict input output.  Daily weights. Diuretics as per nephrology  Stroke of undetermined age -CT of the head showed age indeterminate right cerebellar infarcts.  Doubt that patient can undergo MRI at this time -Eliquis discontinued on 12/23/2020 because of some rectal bleeding  Acute metabolic encephalopathy -Very slow to respond.  Monitor mental status.  Fall precautions.  SLP evaluation.  Diet as per SLP recommendations  Rectal bleeding -Patient had some rectal bleeding on  12/23/2020. Eliquis discontinued. Hemoglobin 8.2 today. Not a candidate for any further work-up at this time.  Right posterior tibial stage II pressure ulcer and cellulitis: Present on admission -Continue Rocephin.  Wound care consult  Chronic A. Fib -Currently tachycardic intermittently.    Hypertension -Monitor blood pressure. Intermittently on the lower side  Anemia of chronic disease -Hemoglobin currently stable.  Monitor  Generalized deconditioning Chronic ambulatory dysfunction -Overall condition has not improved. Renal function is deteriorating with very poor urine output and patient is in respiratory failure and heart failure. Will recommend focusing on total comfort.   DVT prophylaxis: Eliquis Code Status: Discussed with sister and will change code status to DNR Family Communication: spoke to sister/Helen on phone on 01-21-2021 Disposition Plan: Status is: Inpatient  Remains inpatient appropriate because:Inpatient level of care appropriate due to severity of illness   Dispo: The patient is from: Home              Anticipated d/c is to: Home              Patient currently is not medically stable to d/c.  anticipated discharge date unclear at this time as patient is clinically very ill.   Difficult to place patient No  Consultants: Nephrology.  palliative care  Procedures: None  Antimicrobials: Rocephin from 12/05/2020 onwards   Subjective: Patient seen and examined at bedside. No overnight fever, agitation reported nursing staff. Nursing staff reported that patient had some rectal bleeding yesterday. Objective: Vitals:   12/23/20 1920 21-Jan-2021 0030 01/21/2021 0335 21-Jan-2021 0524  BP: 91/65 (!) 107/52 105/69   Pulse: (!) 106 (!) 108 (!) 105   Resp: 12 13 13  Temp: 97.8 F (36.6 C) (!) 97 F (36.1 C) (!) 97.1 F (36.2 C) (!) (P) 97.4 F (36.3 C)  TempSrc: Axillary  Axillary (P) Axillary  SpO2: 98% 96% 96%   Weight:  56.2 kg    Height:        Intake/Output  Summary (Last 24 hours) at 01-09-2021 0741 Last data filed at 12/23/2020 2304 Gross per 24 hour  Intake 599.92 ml  Output 125 ml  Net 474.92 ml   Filed Weights   12/09/2020 1150 12/23/20 0123 January 09, 2021 0030  Weight: 43 kg 55.3 kg 56.2 kg    Examination:  General exam: No acute distress. Looks chronically ill. Currently on 4 L oxygen by nasal cannula. Hardly wakes up.  Respiratory system: Decreased breath sounds at bases bilaterally with scattered crackles, cardiovascular system: Tachycardic, S1-S2 heard Gastrointestinal system: Abdomen is slightly distended, soft and nontender. Bowel sounds are heard  extremities: Lower extremity edema present bilaterally; no cyanosis or clubbing Central nervous system: Hardly wakes up. No focal neurological deficits. Moves extremities Skin: No obvious ecchymosis/lesions Psychiatry: Cannot assess because of mental status    Data Reviewed: I have personally reviewed following labs and imaging studies  CBC: Recent Labs  Lab 12/15/2020 1300 12/23/20 0252 2021-01-09 0404  WBC 10.9* 8.2 8.8  NEUTROABS 9.6*  --  8.2*  HGB 10.8* 9.8* 10.6*  HCT 37.2 32.2* 36.3  MCV 95.9 95.0 96.8  PLT 261 214 440   Basic Metabolic Panel: Recent Labs  Lab 12/07/2020 1300 12/06/2020 1614 12/05/2020 2110 11/28/2020 2319 12/23/20 0252 01/09/2021 0404  NA 132*  --  135 137 137 141  K 7.0* 7.0* 6.7* 6.2* 5.7* 5.6*  CL 105  --  109 108 108 107  CO2 17*  --  15* 17* 17* 22  GLUCOSE 91  --  86 127* 97 112*  BUN 103*  --  101* 99* 91* 99*  CREATININE 3.65*  --  3.48* 3.53* 3.46* 3.79*  CALCIUM 8.6*  --  8.6* 8.5* 8.9 8.4*  MG  --  2.0  --   --   --  2.0   GFR: Estimated Creatinine Clearance: 6.8 mL/min (A) (by C-G formula based on SCr of 3.79 mg/dL (H)). Liver Function Tests: Recent Labs  Lab 12/12/2020 1300 01/09/2021 0404  AST 74* 60*  ALT 26 23  ALKPHOS 41 32*  BILITOT 1.2 1.4*  PROT 5.7* 5.7*  ALBUMIN 2.8* 3.4*   No results for input(s): LIPASE, AMYLASE in the  last 168 hours. No results for input(s): AMMONIA in the last 168 hours. Coagulation Profile: No results for input(s): INR, PROTIME in the last 168 hours. Cardiac Enzymes: Recent Labs  Lab 12/23/2020 1545  CKTOTAL 365*   BNP (last 3 results) No results for input(s): PROBNP in the last 8760 hours. HbA1C: No results for input(s): HGBA1C in the last 72 hours. CBG: Recent Labs  Lab 12/08/2020 2207  GLUCAP 74   Lipid Profile: No results for input(s): CHOL, HDL, LDLCALC, TRIG, CHOLHDL, LDLDIRECT in the last 72 hours. Thyroid Function Tests: No results for input(s): TSH, T4TOTAL, FREET4, T3FREE, THYROIDAB in the last 72 hours. Anemia Panel: Recent Labs    11/30/2020 2319  TIBC 368  IRON 22*   Sepsis Labs: Recent Labs  Lab 12/14/2020 2109 12/02/2020 2319  LATICACIDVEN 0.6 1.3    Recent Results (from the past 240 hour(s))  Urine Culture     Status: None   Collection Time: 12/20/20  4:31 PM   Specimen: Urine  Result Value Ref Range Status   MICRO NUMBER: [id number]  Final   SPECIMEN QUALITY: Adequate  Final   Sample Source NOT GIVEN  Final   STATUS: FINAL  Final   ISOLATE 1:   Final    Mixed genital flora isolated. These superficial bacteria are not indicative of a urinary tract infection. No further organism identification is warranted on this specimen. If clinically indicated, recollect clean-catch, mid-stream urine and transfer  immediately to Urine Culture Transport Tube.   Resp Panel by RT-PCR (Flu A&B, Covid) Nasopharyngeal Swab     Status: None   Collection Time: 12/20/2020  5:29 PM   Specimen: Nasopharyngeal Swab; Nasopharyngeal(NP) swabs in vial transport medium  Result Value Ref Range Status   SARS Coronavirus 2 by RT PCR NEGATIVE NEGATIVE Final    Comment: (NOTE) SARS-CoV-2 target nucleic acids are NOT DETECTED.  The SARS-CoV-2 RNA is generally detectable in upper respiratory specimens during the acute phase of infection. The lowest concentration of SARS-CoV-2 viral  copies this assay can detect is 138 copies/mL. A negative result does not preclude SARS-Cov-2 infection and should not be used as the sole basis for treatment or other patient management decisions. A negative result may occur with  improper specimen collection/handling, submission of specimen other than nasopharyngeal swab, presence of viral mutation(s) within the areas targeted by this assay, and inadequate number of viral copies(<138 copies/mL). A negative result must be combined with clinical observations, patient history, and epidemiological information. The expected result is Negative.  Fact Sheet for Patients:  EntrepreneurPulse.com.au  Fact Sheet for Healthcare Providers:  IncredibleEmployment.be  This test is no t yet approved or cleared by the [place] FDA and  has been authorized for detection and/or diagnosis of SARS-CoV-2 by FDA under an Emergency Use Authorization (EUA). This EUA will remain  in effect (meaning this test can be used) for the duration of the COVID-19 declaration under Section 564(b)(1) of the Act, 21 U.S.C.section 360bbb-3(b)(1), unless the authorization is terminated  or revoked sooner.       Influenza A by PCR NEGATIVE NEGATIVE Final   Influenza B by PCR NEGATIVE NEGATIVE Final    Comment: (NOTE) The Xpert Xpress SARS-CoV-2/FLU/RSV plus assay is intended as an aid in the diagnosis of influenza from Nasopharyngeal swab specimens and should not be used as a sole basis for treatment. Nasal washings and aspirates are unacceptable for Xpert Xpress SARS-CoV-2/FLU/RSV testing.  Fact Sheet for Patients: EntrepreneurPulse.com.au  Fact Sheet for Healthcare Providers: IncredibleEmployment.be  This test is not yet approved or cleared by the [place] FDA and has been authorized for detection and/or diagnosis of SARS-CoV-2 by FDA under an Emergency Use Authorization (EUA). This  EUA will remain in effect (meaning this test can be used) for the duration of the COVID-19 declaration under Section 564(b)(1) of the Act, 21 U.S.C. section 360bbb-3(b)(1), unless the authorization is terminated or revoked.  Performed at [facility], [place] [address], [place], [place] [address]          Radiology Studies: DG Chest 1 View  Result Date: 12/14/2020 CLINICAL DATA:  Found on the floor.  Confusion. EXAM: CHEST  1 VIEW COMPARISON:  07/10/2020 FINDINGS: There is cardiomegaly and aortic atherosclerosis. Hiatal hernia again noted. There is interstitial and alveolar pulmonary edema with bilateral effusions, left larger than right. Findings consistent with congestive heart failure. No acute bone finding. IMPRESSION: Congestive heart failure with pulmonary edema and bilateral effusions, left larger than right. Electronically Signed   By:  Nelson Chimes M.D.   On: 12/03/2020 20:25   DG Knee 2 Views Left  Result Date: 12/08/2020 CLINICAL DATA:  Left knee pain after fall today. EXAM: LEFT KNEE - 1-2 VIEW COMPARISON:  None. FINDINGS: No evidence of fracture, dislocation, or joint effusion. Severe narrowing of medial joint space is noted. Moderate narrowing of patellofemoral space is noted. Soft tissues are unremarkable. IMPRESSION: Severe degenerative joint disease is noted medially. No acute abnormality seen in the left knee. Electronically Signed   By: Marijo Conception M.D.   On: 12/16/2020 13:57   CT Head Wo Contrast  Result Date: 12/14/2020 CLINICAL DATA:  Facial trauma. Neck trauma. Additional history provided: Patient found down by caregiver, left-sided facial swelling present. EXAM: CT HEAD WITHOUT CONTRAST CT MAXILLOFACIAL WITHOUT CONTRAST CT CERVICAL SPINE WITHOUT CONTRAST TECHNIQUE: Multidetector CT imaging of the head, cervical spine, and maxillofacial structures were performed using the standard protocol without intravenous contrast. Multiplanar CT image reconstructions of the  cervical spine and maxillofacial structures were also generated. COMPARISON:  MRI/MRA head 09/27/2017.  Head CT 09/26/2017. FINDINGS: CT HEAD FINDINGS Brain: Mild-to-moderate cerebral atrophy. Redemonstrated densely calcified meningioma overlying the lateral left frontal lobe measuring 1.9 x 1.5 cm (series 5, image 24). Unchanged local mass effect upon the underlying left frontal lobe. No midline shift. Redemonstrated chronic right MCA territory cortical/subcortical infarct within the right frontal, parietal and temporal lobes as well as posterior right insula. Background advanced ill-defined hypoattenuation within the cerebral white matter which is nonspecific, but compatible with chronic small vessel ischemic disease. There are small infarcts within the right cerebellar hemisphere which are new as compared to the brain MRI of 09/27/2017, but otherwise age indeterminate. There is no acute intracranial hemorrhage. No demarcated cortical infarct. No extra-axial fluid collection. Vascular: Atherosclerotic calcifications. Skull: Normal. Negative for fracture or focal lesion. CT MAXILLOFACIAL FINDINGS Mildly motion degraded exam. Osseous: No acute maxillofacial fracture is identified. Orbits: No acute finding within the orbits. The globes are normal in size and contour. The extraocular muscles and optic nerve sheath complexes are symmetric and unremarkable. Sinuses: Small volume fluid within the right frontal sinus and scattered within right ethmoid air cells. Air-fluid levels within the bilateral maxillary sinuses. Background mild scattered paranasal sinus mucosal thickening. Soft tissues: Left cheek soft tissue swelling/hematoma. CT CERVICAL SPINE FINDINGS Mildly motion degraded exam. Alignment: Cervical dextrocurvature. Straightening of the expected cervical lordosis. 2 mm C3-C4 grade 1 anterolisthesis. Skull base and vertebrae: The basion-dental and atlanto-dental intervals are maintained.No evidence of acute  fracture to the cervical spine. Soft tissues and spinal canal: No prevertebral fluid or swelling. No visible canal hematoma. Disc levels: Cervical spondylosis with multilevel disc space narrowing, disc bulges, uncovertebral hypertrophy and facet arthrosis. Disc space narrowing is severe at C4-C5, C5-C6, C6-C7 and C7-T1. No appreciable high-grade spinal canal stenosis. Multilevel bony neural foraminal narrowing. Facet joint ankylosis on the left at C4-C5. Upper chest: No consolidation within the imaged lung apices. No visible pneumothorax. Partially imaged left pleural effusion. IMPRESSION: CT head: 1. No acute posttraumatic intracranial findings. 2. Small infarcts within the right cerebellar hemisphere are new as compared to the prior brain MRI of 09/27/2017, but otherwise age-indeterminate. Consider a brain MRI for further evaluation. 3. 1.9 x 1.5 cm densely calcified meningioma overlying the lateral left frontal lobe with mild localized mass effect, unchanged. 4. Redemonstrated chronic cortical/subcortical right MCA territory infarct. 5. Stable background generalized cerebral atrophy and advanced chronic small vessel ischemic disease. CT maxillofacial: 1. Mildly motion degraded exam 2.  No evidence of acute maxillofacial fracture. 3. Left cheek soft tissue swelling/hematoma. 4. Paranasal sinus disease with air-fluid levels, as described. Correlate for acute sinusitis. CT cervical spine: 1. Mildly motion degraded exam. 2. No evidence of acute fracture to the cervical spine. 3. Nonspecific straightening of the expected cervical lordosis. 4. Cervical dextrocurvature. 5. 2 mm C3-C4 grade 1 anterolisthesis. 6. Advanced cervical spondylosis as described. This includes degenerative facet joint ankylosis on the left at C4-C5. 7. Partially imaged left pleural effusion. Electronically Signed   By: Kellie Simmering DO   On: 12/23/2020 15:17   CT Cervical Spine Wo Contrast  Result Date: 12/02/2020 CLINICAL DATA:  Facial  trauma. Neck trauma. Additional history provided: Patient found down by caregiver, left-sided facial swelling present. EXAM: CT HEAD WITHOUT CONTRAST CT MAXILLOFACIAL WITHOUT CONTRAST CT CERVICAL SPINE WITHOUT CONTRAST TECHNIQUE: Multidetector CT imaging of the head, cervical spine, and maxillofacial structures were performed using the standard protocol without intravenous contrast. Multiplanar CT image reconstructions of the cervical spine and maxillofacial structures were also generated. COMPARISON:  MRI/MRA head 09/27/2017.  Head CT 09/26/2017. FINDINGS: CT HEAD FINDINGS Brain: Mild-to-moderate cerebral atrophy. Redemonstrated densely calcified meningioma overlying the lateral left frontal lobe measuring 1.9 x 1.5 cm (series 5, image 24). Unchanged local mass effect upon the underlying left frontal lobe. No midline shift. Redemonstrated chronic right MCA territory cortical/subcortical infarct within the right frontal, parietal and temporal lobes as well as posterior right insula. Background advanced ill-defined hypoattenuation within the cerebral white matter which is nonspecific, but compatible with chronic small vessel ischemic disease. There are small infarcts within the right cerebellar hemisphere which are new as compared to the brain MRI of 09/27/2017, but otherwise age indeterminate. There is no acute intracranial hemorrhage. No demarcated cortical infarct. No extra-axial fluid collection. Vascular: Atherosclerotic calcifications. Skull: Normal. Negative for fracture or focal lesion. CT MAXILLOFACIAL FINDINGS Mildly motion degraded exam. Osseous: No acute maxillofacial fracture is identified. Orbits: No acute finding within the orbits. The globes are normal in size and contour. The extraocular muscles and optic nerve sheath complexes are symmetric and unremarkable. Sinuses: Small volume fluid within the right frontal sinus and scattered within right ethmoid air cells. Air-fluid levels within the bilateral  maxillary sinuses. Background mild scattered paranasal sinus mucosal thickening. Soft tissues: Left cheek soft tissue swelling/hematoma. CT CERVICAL SPINE FINDINGS Mildly motion degraded exam. Alignment: Cervical dextrocurvature. Straightening of the expected cervical lordosis. 2 mm C3-C4 grade 1 anterolisthesis. Skull base and vertebrae: The basion-dental and atlanto-dental intervals are maintained.No evidence of acute fracture to the cervical spine. Soft tissues and spinal canal: No prevertebral fluid or swelling. No visible canal hematoma. Disc levels: Cervical spondylosis with multilevel disc space narrowing, disc bulges, uncovertebral hypertrophy and facet arthrosis. Disc space narrowing is severe at C4-C5, C5-C6, C6-C7 and C7-T1. No appreciable high-grade spinal canal stenosis. Multilevel bony neural foraminal narrowing. Facet joint ankylosis on the left at C4-C5. Upper chest: No consolidation within the imaged lung apices. No visible pneumothorax. Partially imaged left pleural effusion. IMPRESSION: CT head: 1. No acute posttraumatic intracranial findings. 2. Small infarcts within the right cerebellar hemisphere are new as compared to the prior brain MRI of 09/27/2017, but otherwise age-indeterminate. Consider a brain MRI for further evaluation. 3. 1.9 x 1.5 cm densely calcified meningioma overlying the lateral left frontal lobe with mild localized mass effect, unchanged. 4. Redemonstrated chronic cortical/subcortical right MCA territory infarct. 5. Stable background generalized cerebral atrophy and advanced chronic small vessel ischemic disease. CT maxillofacial: 1. Mildly motion degraded  exam 2. No evidence of acute maxillofacial fracture. 3. Left cheek soft tissue swelling/hematoma. 4. Paranasal sinus disease with air-fluid levels, as described. Correlate for acute sinusitis. CT cervical spine: 1. Mildly motion degraded exam. 2. No evidence of acute fracture to the cervical spine. 3. Nonspecific  straightening of the expected cervical lordosis. 4. Cervical dextrocurvature. 5. 2 mm C3-C4 grade 1 anterolisthesis. 6. Advanced cervical spondylosis as described. This includes degenerative facet joint ankylosis on the left at C4-C5. 7. Partially imaged left pleural effusion. Electronically Signed   By: Kellie Simmering DO   On: 12/12/2020 15:17   CT PELVIS WO CONTRAST  Result Date: 12/10/2020 CLINICAL DATA:  Pelvic trauma Concern for left hip fracture EXAM: CT PELVIS WITHOUT CONTRAST TECHNIQUE: Multidetector CT imaging of the pelvis was performed following the standard protocol without intravenous contrast. COMPARISON:  X-ray hip 12/08/2020, x-ray sacrum 09/15/2020 FINDINGS: Urinary Tract:  No abnormality visualized. Bowel:  Sigmoid diverticulosis. Vascular/Lymphatic: No pathologically enlarged lymph nodes. Severe atherosclerotic plaque otherwise no significant vascular abnormality seen. Reproductive:  No mass or other significant abnormality Other: Trace volume simple free fluid within the pelvis. No free intraperitoneal gas. No organized fluid collection. Mesenteric fat stranding along the hepatic flexure. Suggestion of a cystic lesion in the expected region of the inferior pole of the left kidney (3:1). Musculoskeletal: Marked subcutaneus soft tissue edema of the left hip/flank only partially visualized. Diffusely decreased bone density. Heterogeneous sclerotic appearance of the sacrum may represent degenerative changes/old healed fracture versus focal sclerotic lesion (9: 94-99). No acute displaced fracture. Grade 1 anterolisthesis of L5 on S1 with severe intervertebral disc space narrowing and vacuum phenomenon, endplate sclerosis, facet arthropathy, possible left L5 pars interarticularis defect. IMPRESSION: 1. Heterogeneous sclerotic appearance of the sacrum may represent healed fracture versus focal sclerotic lesion. Consider MRI for further evaluation. 2. Mesenteric fat stranding along the hepatic flexure  as well as trace volume simple free fluid within the pelvis. Consider CT abdomen with intravenous contrast if clinically indicated. 3. No acute displaced fracture or dislocation of the hips. Marked subcutaneus soft tissue edema of the left hip/flank only partially visualized. 4. No acute displaced fracture or diastasis of the pelvis. 5. Grade 1 anterolisthesis and severe degenerative changes at the L5-S1 level. 6. Aortic Atherosclerosis (ICD10-I70.0). Electronically Signed   By: Iven Finn M.D.   On: 12/12/2020 15:37   US RENAL  Result Date: 12/02/2020 CLINICAL DATA:  Acute kidney injury. EXAM: RENAL / URINARY TRACT ULTRASOUND COMPLETE COMPARISON:  None. FINDINGS: Right Kidney: Renal measurements: 9.1 x 6.1 x 6.1 cm = volume: 176 mL. Increased echogenicity of renal parenchyma is noted suggesting medical renal disease. 5.5 cm simple cyst is seen in upper pole. No mass or hydronephrosis visualized. Left Kidney: Renal measurements: 9.4 x 4.7 x 4.5 cm = volume: 105 mL. Increased echogenicity of renal parenchyma is noted suggesting medical renal disease. 2 renal cysts are noted, the largest measuring 3.9 cm with 2 thin septations. No mass or hydronephrosis visualized. Bladder: Decompressed secondary to Foley catheter. Other: None. IMPRESSION: Increased echogenicity of renal parenchyma is noted bilaterally suggesting medical renal disease. Bilateral renal cysts are noted. No hydronephrosis or renal obstruction is noted. Electronically Signed   By: Marijo Conception M.D.   On: 12/01/2020 20:13   DG Hip Unilat With Pelvis 2-3 Views Left  Result Date: 12/21/2020 CLINICAL DATA:  Left knee pain after fall today. EXAM: DG HIP (WITH OR WITHOUT PELVIS) 2-3V LEFT COMPARISON:  None. FINDINGS: There is no evidence of  hip fracture or dislocation. There is no evidence of arthropathy or other focal bone abnormality. IMPRESSION: Negative. Electronically Signed   By: Marijo Conception M.D.   On: 12/07/2020 13:56   CT  Maxillofacial WO CM  Result Date: 12/08/2020 CLINICAL DATA:  Facial trauma. Neck trauma. Additional history provided: Patient found down by caregiver, left-sided facial swelling present. EXAM: CT HEAD WITHOUT CONTRAST CT MAXILLOFACIAL WITHOUT CONTRAST CT CERVICAL SPINE WITHOUT CONTRAST TECHNIQUE: Multidetector CT imaging of the head, cervical spine, and maxillofacial structures were performed using the standard protocol without intravenous contrast. Multiplanar CT image reconstructions of the cervical spine and maxillofacial structures were also generated. COMPARISON:  MRI/MRA head 09/27/2017.  Head CT 09/26/2017. FINDINGS: CT HEAD FINDINGS Brain: Mild-to-moderate cerebral atrophy. Redemonstrated densely calcified meningioma overlying the lateral left frontal lobe measuring 1.9 x 1.5 cm (series 5, image 24). Unchanged local mass effect upon the underlying left frontal lobe. No midline shift. Redemonstrated chronic right MCA territory cortical/subcortical infarct within the right frontal, parietal and temporal lobes as well as posterior right insula. Background advanced ill-defined hypoattenuation within the cerebral white matter which is nonspecific, but compatible with chronic small vessel ischemic disease. There are small infarcts within the right cerebellar hemisphere which are new as compared to the brain MRI of 09/27/2017, but otherwise age indeterminate. There is no acute intracranial hemorrhage. No demarcated cortical infarct. No extra-axial fluid collection. Vascular: Atherosclerotic calcifications. Skull: Normal. Negative for fracture or focal lesion. CT MAXILLOFACIAL FINDINGS Mildly motion degraded exam. Osseous: No acute maxillofacial fracture is identified. Orbits: No acute finding within the orbits. The globes are normal in size and contour. The extraocular muscles and optic nerve sheath complexes are symmetric and unremarkable. Sinuses: Small volume fluid within the right frontal sinus and scattered  within right ethmoid air cells. Air-fluid levels within the bilateral maxillary sinuses. Background mild scattered paranasal sinus mucosal thickening. Soft tissues: Left cheek soft tissue swelling/hematoma. CT CERVICAL SPINE FINDINGS Mildly motion degraded exam. Alignment: Cervical dextrocurvature. Straightening of the expected cervical lordosis. 2 mm C3-C4 grade 1 anterolisthesis. Skull base and vertebrae: The basion-dental and atlanto-dental intervals are maintained.No evidence of acute fracture to the cervical spine. Soft tissues and spinal canal: No prevertebral fluid or swelling. No visible canal hematoma. Disc levels: Cervical spondylosis with multilevel disc space narrowing, disc bulges, uncovertebral hypertrophy and facet arthrosis. Disc space narrowing is severe at C4-C5, C5-C6, C6-C7 and C7-T1. No appreciable high-grade spinal canal stenosis. Multilevel bony neural foraminal narrowing. Facet joint ankylosis on the left at C4-C5. Upper chest: No consolidation within the imaged lung apices. No visible pneumothorax. Partially imaged left pleural effusion. IMPRESSION: CT head: 1. No acute posttraumatic intracranial findings. 2. Small infarcts within the right cerebellar hemisphere are new as compared to the prior brain MRI of 09/27/2017, but otherwise age-indeterminate. Consider a brain MRI for further evaluation. 3. 1.9 x 1.5 cm densely calcified meningioma overlying the lateral left frontal lobe with mild localized mass effect, unchanged. 4. Redemonstrated chronic cortical/subcortical right MCA territory infarct. 5. Stable background generalized cerebral atrophy and advanced chronic small vessel ischemic disease. CT maxillofacial: 1. Mildly motion degraded exam 2. No evidence of acute maxillofacial fracture. 3. Left cheek soft tissue swelling/hematoma. 4. Paranasal sinus disease with air-fluid levels, as described. Correlate for acute sinusitis. CT cervical spine: 1. Mildly motion degraded exam. 2. No  evidence of acute fracture to the cervical spine. 3. Nonspecific straightening of the expected cervical lordosis. 4. Cervical dextrocurvature. 5. 2 mm C3-C4 grade 1 anterolisthesis. 6. Advanced cervical  spondylosis as described. This includes degenerative facet joint ankylosis on the left at C4-C5. 7. Partially imaged left pleural effusion. Electronically Signed   By: Kellie Simmering DO   On: 12/19/2020 15:17        Scheduled Meds: . Chlorhexidine Gluconate Cloth  6 each Topical Daily  . fluticasone  1 spray Each Nare Daily  . midodrine  10 mg Oral TID WC   Continuous Infusions: . cefTRIAXone (ROCEPHIN)  IV 1 g (12/23/20 2109)          Aline August, MD [facility] 2021-01-05, 7:41 AM

## 2020-12-26 NOTE — Progress Notes (Signed)
Patient transitioning to comfort care measures. Telemetry discontinued and IV fluids removed. Patient repositioned and family updated and expected at bedside sometime after 12pm

## 2020-12-26 NOTE — Progress Notes (Signed)
Palliative:  HPI: 87 y.o. female  with past medical history of afib on Eliquis, hypertension, CKD stage II, chronic ambulatory dysfunction requiring walker, gout, osteoporosis, recurrent falls, recent weight loss with poor oral intake admitted on 12/19/2020 with confusion and found down by caregiver. Hospital admission for AKI on CKD receiving bicarb infusion and albumin, hyperkalemia (7.0 on admit), acute hypoxic respiratory failure likely related to acute CHF with pulmonary edema and bilateral effusions on CXR. CT head reveals age indeterminate right cerebellar infarcts. Prognosis is poor secondary to multiorgan failure--renal failure, respiratory failure, CHF, poor oral intake, and poor urine output. Patient made DNR on 2/26 following Dr. Lindalou Hose conversation with sister. Palliative medicine consultation for goals of care.    I met today at Ms. Lormand's bedside and she is unresponsive but resting comfortably. I called and spoke with sister, Candis Musa, who confirms desire for comfort care. She has been in communication with doctors this morning as well as other family members. Ms. Louis daughter is en-route to visit this afternoon.  I met at Ms. Christina's bedside today and discussed with her daughter, Horris Latino, son-in-law, granddaughter, grandson and they all confirm desire for comfort care. Horris Latino shares that her mother has been verbalizing for two years readiness "to go" and Horris Latino knows that she is ready to be out of pain. Horris Latino is very clear on goal that Ms. Lawler not suffer or be in pain. We discussed close monitoring and scheduled dosing to ensure comfort.   We also discussed wishes for end of life as far as placement. After discussing all options we all agree that she should be comfortable here in the hospital. No desire for hospice facility or home with hospice. I am not sure that we would have a good window to get her out of the hospital anyway so continuing hospitalization is  appropriate. We also reviewed hospital visitation policy for end of life.   All questions/concerns addressed. Emotional support provided.   Exam: Minimally responsive. Restless with family at bedside. No distress. Pale. Frail, elderly. Breathing regular but shallow on 4L. Abd soft. Overall comfortable, no grimacing or tense muscles.   Plan: - Full comfort care.  - Changed morphine to dilaudid given renal failure and concern for restlessness after give morphine. Dilaudid 0.5-1 mg IV every 30 min as needed. Dilaudid 0.5 mg IV every 6 hours scheduled.  - As needed medication ordered to ensure comfort.   [place], NP Palliative Medicine Team Pager [phone] (Please see [email or web address] for schedule) Team Phone [phone]    Greater than 50%  of this time was spent counseling and coordinating care related to the above assessment and plan

## 2020-12-26 NOTE — Consult Note (Signed)
WOC Nurse Consult Note: Reason for Consult: Right leg with full thickness areas of skin loss. I am assisted with my consultation by [facility]. Wound type:venous insufficiency Pressure Injury POA: N/A Measurement:To be obtained by Bedside RN with dressing change today. Wound bed:red, moist, no areas of nonviable tissue. Epidermis gathered at medial edge where serum-filled blister ruptured. Nonmovable. Drainage (amount, consistency, odor) serous Periwound: dry, intact Dressing procedure/placement/frequency: Wounds are contributing to discomfort, patient is premedicated for pain.  The overall patient status is noted. Conservative care measures aimed at antimicrobial and nonadherent are employed, we will use twice daily xeroform gauze topped with ABD and secured with Kerlix roll gauze twice daily.  Feet are to be placed into bilateral Prevalon boots for elevation and pressure injury prevention.Sacral foam is in place.  [place] nursing team will not follow, but will remain available to this patient, the nursing and medical teams.  Please re-consult if needed. Thanks, Maudie Flakes, MSN, RN, [place], Arther Abbott  Pager# [phone]

## 2020-12-26 NOTE — Progress Notes (Signed)
   31-Dec-2020 0800  Assess: MEWS Score  BP (!) 99/59  Pulse Rate (!) 114  ECG Heart Rate (!) 111  Resp 12  Level of Consciousness Responds to Voice  SpO2 100 %  O2 Device Nasal Cannula  Assess: MEWS Score  MEWS Temp 0  MEWS Systolic 1  MEWS Pulse 2  MEWS RR 1  MEWS LOC 1  MEWS Score 5  MEWS Score Color Red  Assess: if the MEWS score is Yellow or Red  Were vital signs taken at a resting state? Yes  Focused Assessment No change from prior assessment  Early Detection of Sepsis Score *See Row Information* Medium  MEWS guidelines implemented *See Row Information* No, previously yellow, continue vital signs every 4 hours  Treat  Pain Scale Faces  Notify: Provider  Provider Name/Title Dr. Starla Link  Document  Progress note created (see row info) Yes   Patient with declining vital signs. MD at bedside aware and is in communication with family about patient status. Discussions about transitioning to comfort care. Will continue to monitor

## 2020-12-26 NOTE — Progress Notes (Signed)
[facility] Progress Note  Name: Charlene Wade MRN: [id number] DOB: [birth year]  Subjective:  Had 125 mL UOP over 2/26.  Spoke with primary team and they are contacting her sister about a potential transition to comfort measures.    Review of systems:  Unable to obtain 2/2 AMS -------------- Background on consult:  Charlene Wade is a 87 y.o. female with a history of CKD, GERD, hypertension, A. Fib, OA, and history of CVA who presented to the hospital after an unwitnessed fall.  She was found on the floor.  Per nursing report she had fallen multiple times at home and caregiver found her this am. Labs were notable for creatinine 3.65, potassium 7.0.  Baseline creatinine is approximately 1.7-1.8.  She has been given Lokelma in the ER.  Per charting by her expressive aphasia and memory loss secondary to the CVA.  Note covid negative.  Per primary team conversation with sister, patient with weight loss and shortness of breath.  I spoke with her sister via phone.  She has a caregiver during the day and at night is alone.  She has a walker.  On Wednesday, her losartan was stopped because her blood pressure was low and her gabapentin was stopped.  Her son had a kidney transplant and was in his 48's when he died.  She saw him go through dialysis and she hated it for him and her sister states that the patient would never want that.   Intake/Output Summary (Last 24 hours) at 12-31-20 0905 Last data filed at 12/23/2020 2304 Gross per 24 hour  Intake --  Output 125 ml  Net -125 ml    Vitals:  Vitals:   12/23/20 1920 12/31/20 0030 12/31/2020 0335 31-Dec-2020 0524  BP: 91/65 (!) 107/52 105/69   Pulse: (!) 106 (!) 108 (!) 105   Resp: 12 13 13    Temp: 97.8 F (36.6 C) (!) 97 F (36.1 C) (!) 97.1 F (36.2 C) (!) (P) 97.4 F (36.3 C)  TempSrc: Axillary  Axillary (P) Axillary  SpO2: 98% 96% 96%   Weight:  56.2 kg    Height:         Physical Exam:    General: elderly female in bed   HEENT: [place] left sided facial abrasions Neck: supple trachea midline Heart: S1S2 rub vs. As? Lungs: coarse breath sounds; on supp oxygen Abdomen: soft/NT/ND Extremities: no pitting edema appreciated Skin: abrasions as above; legs bandaged Neuro: moaning; does not follow commands or answer questions GU foley in place  Medications reviewed   Labs:  BMP Latest Ref Rng & Units 12-31-20 12/23/2020 12/01/2020  Glucose 70 - 99 mg/dL 112(H) 97 127(H)  BUN 8 - 23 mg/dL 99(H) 91(H) 99(H)  Creatinine 0.44 - 1.00 mg/dL 3.79(H) 3.46(H) 3.53(H)  BUN/Creat Ratio 6 - 22 (calc) - - -  Sodium 135 - 145 mmol/L 141 137 137  Potassium 3.5 - 5.1 mmol/L 5.6(H) 5.7(H) 6.2(H)  Chloride 98 - 111 mmol/L 107 108 108  CO2 22 - 32 mmol/L 22 17(L) 17(L)  Calcium 8.9 - 10.3 mg/dL 8.4(L) 8.9 8.5(L)     Assessment/Plan:   # Hyperkalemia - Setting of AKI on CKD and hx of ARB (was recently stopped this week) - can give lokelma if able to take PO - currently not following commands and there was concern for aspiration with dose yesterday - Patient's sister has stated that she would not want dialysis and per charting DNR/DNI.  Appreciate palliative care  # AKI -  Ischemic insults and ATN.  There is only a mild elevation in CK and mod Hb in urine with 0-5 RBC and neg protein. Not overtly consistent with rhabdo per labs.  CT pelvis no overt urologic abnormality called - Continue foley  - per her sister she would never want dialysis   # Acute hypoxic resp failure  - on supplemental oxygen  - can give lasix as needed for comfort  # Metabolic acidosis - Setting of AKI  - improved  # hypotension - Continue midodrine 10 mg TID  - remain off of ARB  # AMS - stopped cymbalta and oxybutynin and recently taken off gabapentin  # Unwitnessed fall - Trauma imaging per ER and primary  # Normocytic anemia - iron deficiency - s/p feraheme IV  # CKD stage 4 - note baseline Cr 1.7-1.8  Spoke with primary  team and they are contacting her sister about a potential transition to comfort measures.  I agree with a recommendation for transition to comfort measures.  Nephrology will sign off.  Appreciate nursing, primary and palliative team.   Claudia Desanctis, MD 2020/12/29  9:16 AM

## 2020-12-26 NOTE — Death Summary Note (Signed)
Death Summary  Charlene Wade KTG:[id number] DOB: 11-11-1927 DOA: 2021-01-06  PCP: Caren Macadam, MD  Admit date: 01-06-2021 Date of Death: 2021/01/08 Time of Death: 11   History of present illness:  87 year old female with history of chronic A. fib on Eliquis, hypertension, chronic kidney disease stage III, chronic ambulatory dysfunction, gout, osteoporosis presented with recurrent falls and left knee pain.  On presentation, she was hypoxic up to 88% on room air requiring supplemental oxygen.  She had a trauma scan done which was negative except for CT head accidental finding of suspected small right cerebellar infarct.  She was found to have creatinine of 3.6 compared to baseline of 1.4-1.8 and potassium of 7.  Nephrology was consulted. During the hospitalization, her condition did not improve.  Palliative care was also consulted.  Family agreed for transitioning to comfort measures.  She passed away on Jan 08, 2021 at 1725.  Final Diagnoses:   Comfort only measures Acute kidney injury on chronic kidney disease stage IIIb Hyperkalemia Acute metabolic acidosis Acute hypoxic respiratory failure Possible acute unspecified CHF Acute metabolic encephalopathy Stroke of undetermined age Rectal bleeding Right posterior tibial stage II pressure ulcer and cellulitis Chronic A. Fib Hypertension Anemia of chronic disease Generalized deconditioning Chronic ambulatory dysfunction  The results of significant diagnostics from this hospitalization (including imaging, microbiology, ancillary and laboratory) are listed below for reference.    Significant Diagnostic Studies: DG Chest 1 View  Result Date: 01-06-2021 CLINICAL DATA:  Found on the floor.  Confusion. EXAM: CHEST  1 VIEW COMPARISON:  07/10/2020 FINDINGS: There is cardiomegaly and aortic atherosclerosis. Hiatal hernia again noted. There is interstitial and alveolar pulmonary edema with bilateral effusions, left larger than right.  Findings consistent with congestive heart failure. No acute bone finding. IMPRESSION: Congestive heart failure with pulmonary edema and bilateral effusions, left larger than right. Electronically Signed   By: Nelson Chimes M.D.   On: Jan 06, 2021 20:25   DG Knee 2 Views Left  Result Date: 2021-01-06 CLINICAL DATA:  Left knee pain after fall today. EXAM: LEFT KNEE - 1-2 VIEW COMPARISON:  None. FINDINGS: No evidence of fracture, dislocation, or joint effusion. Severe narrowing of medial joint space is noted. Moderate narrowing of patellofemoral space is noted. Soft tissues are unremarkable. IMPRESSION: Severe degenerative joint disease is noted medially. No acute abnormality seen in the left knee. Electronically Signed   By: Marijo Conception M.D.   On: 2021/01/06 13:57   CT Head Wo Contrast  Result Date: Jan 06, 2021 CLINICAL DATA:  Facial trauma. Neck trauma. Additional history provided: Patient found down by caregiver, left-sided facial swelling present. EXAM: CT HEAD WITHOUT CONTRAST CT MAXILLOFACIAL WITHOUT CONTRAST CT CERVICAL SPINE WITHOUT CONTRAST TECHNIQUE: Multidetector CT imaging of the head, cervical spine, and maxillofacial structures were performed using the standard protocol without intravenous contrast. Multiplanar CT image reconstructions of the cervical spine and maxillofacial structures were also generated. COMPARISON:  MRI/MRA head 09/27/2017.  Head CT 09/26/2017. FINDINGS: CT HEAD FINDINGS Brain: Mild-to-moderate cerebral atrophy. Redemonstrated densely calcified meningioma overlying the lateral left frontal lobe measuring 1.9 x 1.5 cm (series 5, image 24). Unchanged local mass effect upon the underlying left frontal lobe. No midline shift. Redemonstrated chronic right MCA territory cortical/subcortical infarct within the right frontal, parietal and temporal lobes as well as posterior right insula. Background advanced ill-defined hypoattenuation within the cerebral white matter which is  nonspecific, but compatible with chronic small vessel ischemic disease. There are small infarcts within the right cerebellar hemisphere which are new as compared  to the brain MRI of 09/27/2017, but otherwise age indeterminate. There is no acute intracranial hemorrhage. No demarcated cortical infarct. No extra-axial fluid collection. Vascular: Atherosclerotic calcifications. Skull: Normal. Negative for fracture or focal lesion. CT MAXILLOFACIAL FINDINGS Mildly motion degraded exam. Osseous: No acute maxillofacial fracture is identified. Orbits: No acute finding within the orbits. The globes are normal in size and contour. The extraocular muscles and optic nerve sheath complexes are symmetric and unremarkable. Sinuses: Small volume fluid within the right frontal sinus and scattered within right ethmoid air cells. Air-fluid levels within the bilateral maxillary sinuses. Background mild scattered paranasal sinus mucosal thickening. Soft tissues: Left cheek soft tissue swelling/hematoma. CT CERVICAL SPINE FINDINGS Mildly motion degraded exam. Alignment: Cervical dextrocurvature. Straightening of the expected cervical lordosis. 2 mm C3-C4 grade 1 anterolisthesis. Skull base and vertebrae: The basion-dental and atlanto-dental intervals are maintained.No evidence of acute fracture to the cervical spine. Soft tissues and spinal canal: No prevertebral fluid or swelling. No visible canal hematoma. Disc levels: Cervical spondylosis with multilevel disc space narrowing, disc bulges, uncovertebral hypertrophy and facet arthrosis. Disc space narrowing is severe at C4-C5, C5-C6, C6-C7 and C7-T1. No appreciable high-grade spinal canal stenosis. Multilevel bony neural foraminal narrowing. Facet joint ankylosis on the left at C4-C5. Upper chest: No consolidation within the imaged lung apices. No visible pneumothorax. Partially imaged left pleural effusion. IMPRESSION: CT head: 1. No acute posttraumatic intracranial findings. 2. Small  infarcts within the right cerebellar hemisphere are new as compared to the prior brain MRI of 09/27/2017, but otherwise age-indeterminate. Consider a brain MRI for further evaluation. 3. 1.9 x 1.5 cm densely calcified meningioma overlying the lateral left frontal lobe with mild localized mass effect, unchanged. 4. Redemonstrated chronic cortical/subcortical right MCA territory infarct. 5. Stable background generalized cerebral atrophy and advanced chronic small vessel ischemic disease. CT maxillofacial: 1. Mildly motion degraded exam 2. No evidence of acute maxillofacial fracture. 3. Left cheek soft tissue swelling/hematoma. 4. Paranasal sinus disease with air-fluid levels, as described. Correlate for acute sinusitis. CT cervical spine: 1. Mildly motion degraded exam. 2. No evidence of acute fracture to the cervical spine. 3. Nonspecific straightening of the expected cervical lordosis. 4. Cervical dextrocurvature. 5. 2 mm C3-C4 grade 1 anterolisthesis. 6. Advanced cervical spondylosis as described. This includes degenerative facet joint ankylosis on the left at C4-C5. 7. Partially imaged left pleural effusion. Electronically Signed   By: Kellie Simmering DO   On: 12/13/2020 15:17   CT Cervical Spine Wo Contrast  Result Date: 12/08/2020 CLINICAL DATA:  Facial trauma. Neck trauma. Additional history provided: Patient found down by caregiver, left-sided facial swelling present. EXAM: CT HEAD WITHOUT CONTRAST CT MAXILLOFACIAL WITHOUT CONTRAST CT CERVICAL SPINE WITHOUT CONTRAST TECHNIQUE: Multidetector CT imaging of the head, cervical spine, and maxillofacial structures were performed using the standard protocol without intravenous contrast. Multiplanar CT image reconstructions of the cervical spine and maxillofacial structures were also generated. COMPARISON:  MRI/MRA head 09/27/2017.  Head CT 09/26/2017. FINDINGS: CT HEAD FINDINGS Brain: Mild-to-moderate cerebral atrophy. Redemonstrated densely calcified meningioma  overlying the lateral left frontal lobe measuring 1.9 x 1.5 cm (series 5, image 24). Unchanged local mass effect upon the underlying left frontal lobe. No midline shift. Redemonstrated chronic right MCA territory cortical/subcortical infarct within the right frontal, parietal and temporal lobes as well as posterior right insula. Background advanced ill-defined hypoattenuation within the cerebral white matter which is nonspecific, but compatible with chronic small vessel ischemic disease. There are small infarcts within the right cerebellar hemisphere which are new  as compared to the brain MRI of 09/27/2017, but otherwise age indeterminate. There is no acute intracranial hemorrhage. No demarcated cortical infarct. No extra-axial fluid collection. Vascular: Atherosclerotic calcifications. Skull: Normal. Negative for fracture or focal lesion. CT MAXILLOFACIAL FINDINGS Mildly motion degraded exam. Osseous: No acute maxillofacial fracture is identified. Orbits: No acute finding within the orbits. The globes are normal in size and contour. The extraocular muscles and optic nerve sheath complexes are symmetric and unremarkable. Sinuses: Small volume fluid within the right frontal sinus and scattered within right ethmoid air cells. Air-fluid levels within the bilateral maxillary sinuses. Background mild scattered paranasal sinus mucosal thickening. Soft tissues: Left cheek soft tissue swelling/hematoma. CT CERVICAL SPINE FINDINGS Mildly motion degraded exam. Alignment: Cervical dextrocurvature. Straightening of the expected cervical lordosis. 2 mm C3-C4 grade 1 anterolisthesis. Skull base and vertebrae: The basion-dental and atlanto-dental intervals are maintained.No evidence of acute fracture to the cervical spine. Soft tissues and spinal canal: No prevertebral fluid or swelling. No visible canal hematoma. Disc levels: Cervical spondylosis with multilevel disc space narrowing, disc bulges, uncovertebral hypertrophy and  facet arthrosis. Disc space narrowing is severe at C4-C5, C5-C6, C6-C7 and C7-T1. No appreciable high-grade spinal canal stenosis. Multilevel bony neural foraminal narrowing. Facet joint ankylosis on the left at C4-C5. Upper chest: No consolidation within the imaged lung apices. No visible pneumothorax. Partially imaged left pleural effusion. IMPRESSION: CT head: 1. No acute posttraumatic intracranial findings. 2. Small infarcts within the right cerebellar hemisphere are new as compared to the prior brain MRI of 09/27/2017, but otherwise age-indeterminate. Consider a brain MRI for further evaluation. 3. 1.9 x 1.5 cm densely calcified meningioma overlying the lateral left frontal lobe with mild localized mass effect, unchanged. 4. Redemonstrated chronic cortical/subcortical right MCA territory infarct. 5. Stable background generalized cerebral atrophy and advanced chronic small vessel ischemic disease. CT maxillofacial: 1. Mildly motion degraded exam 2. No evidence of acute maxillofacial fracture. 3. Left cheek soft tissue swelling/hematoma. 4. Paranasal sinus disease with air-fluid levels, as described. Correlate for acute sinusitis. CT cervical spine: 1. Mildly motion degraded exam. 2. No evidence of acute fracture to the cervical spine. 3. Nonspecific straightening of the expected cervical lordosis. 4. Cervical dextrocurvature. 5. 2 mm C3-C4 grade 1 anterolisthesis. 6. Advanced cervical spondylosis as described. This includes degenerative facet joint ankylosis on the left at C4-C5. 7. Partially imaged left pleural effusion. Electronically Signed   By: Kellie Simmering DO   On: 12/20/2020 15:17   CT PELVIS WO CONTRAST  Result Date: 12/25/2020 CLINICAL DATA:  Pelvic trauma Concern for left hip fracture EXAM: CT PELVIS WITHOUT CONTRAST TECHNIQUE: Multidetector CT imaging of the pelvis was performed following the standard protocol without intravenous contrast. COMPARISON:  X-ray hip 12/14/2020, x-ray sacrum 09/15/2020  FINDINGS: Urinary Tract:  No abnormality visualized. Bowel:  Sigmoid diverticulosis. Vascular/Lymphatic: No pathologically enlarged lymph nodes. Severe atherosclerotic plaque otherwise no significant vascular abnormality seen. Reproductive:  No mass or other significant abnormality Other: Trace volume simple free fluid within the pelvis. No free intraperitoneal gas. No organized fluid collection. Mesenteric fat stranding along the hepatic flexure. Suggestion of a cystic lesion in the expected region of the inferior pole of the left kidney (3:1). Musculoskeletal: Marked subcutaneus soft tissue edema of the left hip/flank only partially visualized. Diffusely decreased bone density. Heterogeneous sclerotic appearance of the sacrum may represent degenerative changes/old healed fracture versus focal sclerotic lesion (9: 94-99). No acute displaced fracture. Grade 1 anterolisthesis of L5 on S1 with severe intervertebral disc space narrowing and vacuum  phenomenon, endplate sclerosis, facet arthropathy, possible left L5 pars interarticularis defect. IMPRESSION: 1. Heterogeneous sclerotic appearance of the sacrum may represent healed fracture versus focal sclerotic lesion. Consider MRI for further evaluation. 2. Mesenteric fat stranding along the hepatic flexure as well as trace volume simple free fluid within the pelvis. Consider CT abdomen with intravenous contrast if clinically indicated. 3. No acute displaced fracture or dislocation of the hips. Marked subcutaneus soft tissue edema of the left hip/flank only partially visualized. 4. No acute displaced fracture or diastasis of the pelvis. 5. Grade 1 anterolisthesis and severe degenerative changes at the L5-S1 level. 6. Aortic Atherosclerosis (ICD10-I70.0). Electronically Signed   By: Iven Finn M.D.   On: 12/16/2020 15:37   US RENAL  Result Date: 12/23/2020 CLINICAL DATA:  Acute kidney injury. EXAM: RENAL / URINARY TRACT ULTRASOUND COMPLETE COMPARISON:  None.  FINDINGS: Right Kidney: Renal measurements: 9.1 x 6.1 x 6.1 cm = volume: 176 mL. Increased echogenicity of renal parenchyma is noted suggesting medical renal disease. 5.5 cm simple cyst is seen in upper pole. No mass or hydronephrosis visualized. Left Kidney: Renal measurements: 9.4 x 4.7 x 4.5 cm = volume: 105 mL. Increased echogenicity of renal parenchyma is noted suggesting medical renal disease. 2 renal cysts are noted, the largest measuring 3.9 cm with 2 thin septations. No mass or hydronephrosis visualized. Bladder: Decompressed secondary to Foley catheter. Other: None. IMPRESSION: Increased echogenicity of renal parenchyma is noted bilaterally suggesting medical renal disease. Bilateral renal cysts are noted. No hydronephrosis or renal obstruction is noted. Electronically Signed   By: Marijo Conception M.D.   On: 12/02/2020 20:13   DG Hip Unilat With Pelvis 2-3 Views Left  Result Date: 12/12/2020 CLINICAL DATA:  Left knee pain after fall today. EXAM: DG HIP (WITH OR WITHOUT PELVIS) 2-3V LEFT COMPARISON:  None. FINDINGS: There is no evidence of hip fracture or dislocation. There is no evidence of arthropathy or other focal bone abnormality. IMPRESSION: Negative. Electronically Signed   By: Marijo Conception M.D.   On: 12/08/2020 13:56   CT Maxillofacial WO CM  Result Date: 12/13/2020 CLINICAL DATA:  Facial trauma. Neck trauma. Additional history provided: Patient found down by caregiver, left-sided facial swelling present. EXAM: CT HEAD WITHOUT CONTRAST CT MAXILLOFACIAL WITHOUT CONTRAST CT CERVICAL SPINE WITHOUT CONTRAST TECHNIQUE: Multidetector CT imaging of the head, cervical spine, and maxillofacial structures were performed using the standard protocol without intravenous contrast. Multiplanar CT image reconstructions of the cervical spine and maxillofacial structures were also generated. COMPARISON:  MRI/MRA head 09/27/2017.  Head CT 09/26/2017. FINDINGS: CT HEAD FINDINGS Brain: Mild-to-moderate  cerebral atrophy. Redemonstrated densely calcified meningioma overlying the lateral left frontal lobe measuring 1.9 x 1.5 cm (series 5, image 24). Unchanged local mass effect upon the underlying left frontal lobe. No midline shift. Redemonstrated chronic right MCA territory cortical/subcortical infarct within the right frontal, parietal and temporal lobes as well as posterior right insula. Background advanced ill-defined hypoattenuation within the cerebral white matter which is nonspecific, but compatible with chronic small vessel ischemic disease. There are small infarcts within the right cerebellar hemisphere which are new as compared to the brain MRI of 09/27/2017, but otherwise age indeterminate. There is no acute intracranial hemorrhage. No demarcated cortical infarct. No extra-axial fluid collection. Vascular: Atherosclerotic calcifications. Skull: Normal. Negative for fracture or focal lesion. CT MAXILLOFACIAL FINDINGS Mildly motion degraded exam. Osseous: No acute maxillofacial fracture is identified. Orbits: No acute finding within the orbits. The globes are normal in size and contour. The  extraocular muscles and optic nerve sheath complexes are symmetric and unremarkable. Sinuses: Small volume fluid within the right frontal sinus and scattered within right ethmoid air cells. Air-fluid levels within the bilateral maxillary sinuses. Background mild scattered paranasal sinus mucosal thickening. Soft tissues: Left cheek soft tissue swelling/hematoma. CT CERVICAL SPINE FINDINGS Mildly motion degraded exam. Alignment: Cervical dextrocurvature. Straightening of the expected cervical lordosis. 2 mm C3-C4 grade 1 anterolisthesis. Skull base and vertebrae: The basion-dental and atlanto-dental intervals are maintained.No evidence of acute fracture to the cervical spine. Soft tissues and spinal canal: No prevertebral fluid or swelling. No visible canal hematoma. Disc levels: Cervical spondylosis with multilevel disc  space narrowing, disc bulges, uncovertebral hypertrophy and facet arthrosis. Disc space narrowing is severe at C4-C5, C5-C6, C6-C7 and C7-T1. No appreciable high-grade spinal canal stenosis. Multilevel bony neural foraminal narrowing. Facet joint ankylosis on the left at C4-C5. Upper chest: No consolidation within the imaged lung apices. No visible pneumothorax. Partially imaged left pleural effusion. IMPRESSION: CT head: 1. No acute posttraumatic intracranial findings. 2. Small infarcts within the right cerebellar hemisphere are new as compared to the prior brain MRI of 09/27/2017, but otherwise age-indeterminate. Consider a brain MRI for further evaluation. 3. 1.9 x 1.5 cm densely calcified meningioma overlying the lateral left frontal lobe with mild localized mass effect, unchanged. 4. Redemonstrated chronic cortical/subcortical right MCA territory infarct. 5. Stable background generalized cerebral atrophy and advanced chronic small vessel ischemic disease. CT maxillofacial: 1. Mildly motion degraded exam 2. No evidence of acute maxillofacial fracture. 3. Left cheek soft tissue swelling/hematoma. 4. Paranasal sinus disease with air-fluid levels, as described. Correlate for acute sinusitis. CT cervical spine: 1. Mildly motion degraded exam. 2. No evidence of acute fracture to the cervical spine. 3. Nonspecific straightening of the expected cervical lordosis. 4. Cervical dextrocurvature. 5. 2 mm C3-C4 grade 1 anterolisthesis. 6. Advanced cervical spondylosis as described. This includes degenerative facet joint ankylosis on the left at C4-C5. 7. Partially imaged left pleural effusion. Electronically Signed   By: Kellie Simmering DO   On: 12/06/2020 15:17    Microbiology: Recent Results (from the past 240 hour(s))  Urine Culture     Status: None   Collection Time: 12/20/20  4:31 PM   Specimen: Urine  Result Value Ref Range Status   MICRO NUMBER: [id number]  Final   SPECIMEN QUALITY: Adequate  Final   Sample  Source NOT GIVEN  Final   STATUS: FINAL  Final   ISOLATE 1:   Final    Mixed genital flora isolated. These superficial bacteria are not indicative of a urinary tract infection. No further organism identification is warranted on this specimen. If clinically indicated, recollect clean-catch, mid-stream urine and transfer  immediately to Urine Culture Transport Tube.   Resp Panel by RT-PCR (Flu A&B, Covid) Nasopharyngeal Swab     Status: None   Collection Time: 12/25/2020  5:29 PM   Specimen: Nasopharyngeal Swab; Nasopharyngeal(NP) swabs in vial transport medium  Result Value Ref Range Status   SARS Coronavirus 2 by RT PCR NEGATIVE NEGATIVE Final    Comment: (NOTE) SARS-CoV-2 target nucleic acids are NOT DETECTED.  The SARS-CoV-2 RNA is generally detectable in upper respiratory specimens during the acute phase of infection. The lowest concentration of SARS-CoV-2 viral copies this assay can detect is 138 copies/mL. A negative result does not preclude SARS-Cov-2 infection and should not be used as the sole basis for treatment or other patient management decisions. A negative result may occur with  improper specimen collection/handling,  submission of specimen other than nasopharyngeal swab, presence of viral mutation(s) within the areas targeted by this assay, and inadequate number of viral copies(<138 copies/mL). A negative result must be combined with clinical observations, patient history, and epidemiological information. The expected result is Negative.  Fact Sheet for Patients:  EntrepreneurPulse.com.au  Fact Sheet for Healthcare Providers:  IncredibleEmployment.be  This test is no t yet approved or cleared by the [place] FDA and  has been authorized for detection and/or diagnosis of SARS-CoV-2 by FDA under an Emergency Use Authorization (EUA). This EUA will remain  in effect (meaning this test can be used) for the duration of the COVID-19  declaration under Section 564(b)(1) of the Act, 21 U.S.C.section 360bbb-3(b)(1), unless the authorization is terminated  or revoked sooner.       Influenza A by PCR NEGATIVE NEGATIVE Final   Influenza B by PCR NEGATIVE NEGATIVE Final    Comment: (NOTE) The Xpert Xpress SARS-CoV-2/FLU/RSV plus assay is intended as an aid in the diagnosis of influenza from Nasopharyngeal swab specimens and should not be used as a sole basis for treatment. Nasal washings and aspirates are unacceptable for Xpert Xpress SARS-CoV-2/FLU/RSV testing.  Fact Sheet for Patients: EntrepreneurPulse.com.au  Fact Sheet for Healthcare Providers: IncredibleEmployment.be  This test is not yet approved or cleared by the [place] FDA and has been authorized for detection and/or diagnosis of SARS-CoV-2 by FDA under an Emergency Use Authorization (EUA). This EUA will remain in effect (meaning this test can be used) for the duration of the COVID-19 declaration under Section 564(b)(1) of the Act, 21 U.S.C. section 360bbb-3(b)(1), unless the authorization is terminated or revoked.  Performed at [facility], [place] [address], [place], [place] [address]      Labs: Basic Metabolic Panel: Recent Labs  Lab 12/07/2020 1300 12/11/2020 1614 12/20/2020 2110 12/09/2020 2319 12/23/20 0252 12/28/20 0404  NA 132*  --  135 137 137 141  K 7.0* 7.0* 6.7* 6.2* 5.7* 5.6*  CL 105  --  109 108 108 107  CO2 17*  --  15* 17* 17* 22  GLUCOSE 91  --  86 127* 97 112*  BUN 103*  --  101* 99* 91* 99*  CREATININE 3.65*  --  3.48* 3.53* 3.46* 3.79*  CALCIUM 8.6*  --  8.6* 8.5* 8.9 8.4*  MG  --  2.0  --   --   --  2.0   Liver Function Tests: Recent Labs  Lab 12/10/2020 1300 Dec 28, 2020 0404  AST 74* 60*  ALT 26 23  ALKPHOS 41 32*  BILITOT 1.2 1.4*  PROT 5.7* 5.7*  ALBUMIN 2.8* 3.4*   No results for input(s): LIPASE, AMYLASE in the last 168 hours. No results for input(s): AMMONIA in the  last 168 hours. CBC: Recent Labs  Lab 12/14/2020 1300 12/23/20 0252 Dec 28, 2020 0404  WBC 10.9* 8.2 8.8  NEUTROABS 9.6*  --  8.2*  HGB 10.8* 9.8* 10.6*  HCT 37.2 32.2* 36.3  MCV 95.9 95.0 96.8  PLT 261 214 216   Cardiac Enzymes: Recent Labs  Lab 12/07/2020 1545  CKTOTAL 365*   D-Dimer No results for input(s): DDIMER in the last 72 hours. BNP: Invalid input(s): POCBNP CBG: Recent Labs  Lab 12/13/2020 2207  GLUCAP 74   Anemia work up Recent Labs    12/11/2020 2319  TIBC 368  IRON 22*   Urinalysis    Component Value Date/Time   COLORURINE YELLOW 12/08/2020 [place] 12/13/2020 1545  LABSPEC 1.014 12/10/2020 1545   PHURINE 5.0 12/23/2020 1545   GLUCOSEU NEGATIVE 12/07/2020 1545   HGBUR MODERATE (A) 12/10/2020 1545   BILIRUBINUR NEGATIVE 11/29/2020 1545   BILIRUBINUR negative 12/20/2020 [place] 12/14/2020 1545   PROTEINUR NEGATIVE 12/17/2020 1545   UROBILINOGEN 0.2 12/20/2020 1601   UROBILINOGEN 0.2 10/16/2012 2210   NITRITE NEGATIVE 12/17/2020 1545   LEUKOCYTESUR NEGATIVE 12/21/2020 1545   [facility] Invalid input(s): PROCALCITONIN,  WBC,  LACTICIDVEN     SIGNED:  Aline August, MD  [facility] 12/25/2020, 11:45 AM

## 2020-12-26 DEATH — deceased

## 2021-01-01 ENCOUNTER — Ambulatory Visit: Payer: Medicare Other | Admitting: Family Medicine

## 2021-01-03 ENCOUNTER — Ambulatory Visit: Payer: Medicare Other | Admitting: Oncology

## 2021-01-03 ENCOUNTER — Other Ambulatory Visit: Payer: Medicare Other

## 2021-01-03 ENCOUNTER — Ambulatory Visit: Payer: Medicare Other

## 2021-01-04 ENCOUNTER — Ambulatory Visit: Payer: Medicare Other

## 2021-01-04 ENCOUNTER — Ambulatory Visit: Payer: Medicare Other | Admitting: Oncology

## 2021-01-04 ENCOUNTER — Other Ambulatory Visit: Payer: Medicare Other

## 2021-02-14 ENCOUNTER — Other Ambulatory Visit: Payer: Self-pay | Admitting: Family Medicine

## 2021-02-14 DIAGNOSIS — I10 Essential (primary) hypertension: Secondary | ICD-10-CM

## 2021-07-23 ENCOUNTER — Ambulatory Visit: Payer: Medicare Other | Admitting: Internal Medicine
